# Patient Record
Sex: Female | Born: 1965 | Race: White | Hispanic: No | Marital: Married | Smoking: Never smoker
Health system: Southern US, Community
[De-identification: ages and names within clinical notes are randomized; demographics above are authoritative.]

## PROBLEM LIST (undated history)

## (undated) DIAGNOSIS — E7800 Pure hypercholesterolemia, unspecified: Secondary | ICD-10-CM

## (undated) DIAGNOSIS — N809 Endometriosis, unspecified: Secondary | ICD-10-CM

## (undated) DIAGNOSIS — R0602 Shortness of breath: Secondary | ICD-10-CM

## (undated) DIAGNOSIS — I319 Disease of pericardium, unspecified: Secondary | ICD-10-CM

## (undated) DIAGNOSIS — R079 Chest pain, unspecified: Secondary | ICD-10-CM

## (undated) DIAGNOSIS — Z9889 Other specified postprocedural states: Secondary | ICD-10-CM

## (undated) DIAGNOSIS — T4145XA Adverse effect of unspecified anesthetic, initial encounter: Secondary | ICD-10-CM

## (undated) DIAGNOSIS — Z9289 Personal history of other medical treatment: Secondary | ICD-10-CM

## (undated) DIAGNOSIS — M1990 Unspecified osteoarthritis, unspecified site: Secondary | ICD-10-CM

## (undated) DIAGNOSIS — Z803 Family history of malignant neoplasm of breast: Secondary | ICD-10-CM

## (undated) DIAGNOSIS — Z8051 Family history of malignant neoplasm of kidney: Secondary | ICD-10-CM

## (undated) DIAGNOSIS — T8859XA Other complications of anesthesia, initial encounter: Secondary | ICD-10-CM

## (undated) DIAGNOSIS — Z800 Family history of malignant neoplasm of digestive organs: Secondary | ICD-10-CM

## (undated) DIAGNOSIS — I514 Myocarditis, unspecified: Secondary | ICD-10-CM

## (undated) DIAGNOSIS — R5383 Other fatigue: Secondary | ICD-10-CM

## (undated) DIAGNOSIS — R002 Palpitations: Secondary | ICD-10-CM

## (undated) DIAGNOSIS — K219 Gastro-esophageal reflux disease without esophagitis: Secondary | ICD-10-CM

## (undated) DIAGNOSIS — K922 Gastrointestinal hemorrhage, unspecified: Secondary | ICD-10-CM

## (undated) DIAGNOSIS — K559 Vascular disorder of intestine, unspecified: Secondary | ICD-10-CM

## (undated) DIAGNOSIS — R112 Nausea with vomiting, unspecified: Secondary | ICD-10-CM

## (undated) HISTORY — DX: Myocarditis, unspecified: I51.4

## (undated) HISTORY — DX: Chest pain, unspecified: R07.9

## (undated) HISTORY — DX: Disease of pericardium, unspecified: I31.9

## (undated) HISTORY — DX: Endometriosis, unspecified: N80.9

## (undated) HISTORY — DX: Gastrointestinal hemorrhage, unspecified: K92.2

## (undated) HISTORY — DX: Family history of malignant neoplasm of digestive organs: Z80.0

## (undated) HISTORY — DX: Personal history of other medical treatment: Z92.89

## (undated) HISTORY — DX: Pure hypercholesterolemia, unspecified: E78.00

## (undated) HISTORY — PX: LAPAROSCOPY: SHX197

## (undated) HISTORY — DX: Palpitations: R00.2

## (undated) HISTORY — DX: Family history of malignant neoplasm of kidney: Z80.51

## (undated) HISTORY — DX: Vascular disorder of intestine, unspecified: K55.9

## (undated) HISTORY — DX: Family history of malignant neoplasm of breast: Z80.3

## (undated) HISTORY — DX: Shortness of breath: R06.02

## (undated) HISTORY — DX: Other fatigue: R53.83

---

## 1998-02-06 ENCOUNTER — Inpatient Hospital Stay (HOSPITAL_COMMUNITY): Admission: AD | Admit: 1998-02-06 | Discharge: 1998-02-09 | Payer: Self-pay | Admitting: Obstetrics & Gynecology

## 1998-02-10 ENCOUNTER — Encounter (HOSPITAL_COMMUNITY): Admission: RE | Admit: 1998-02-10 | Discharge: 1998-02-21 | Payer: Self-pay | Admitting: Obstetrics & Gynecology

## 1998-03-15 ENCOUNTER — Other Ambulatory Visit: Admission: RE | Admit: 1998-03-15 | Discharge: 1998-03-15 | Payer: Self-pay | Admitting: Obstetrics & Gynecology

## 2000-03-08 ENCOUNTER — Other Ambulatory Visit: Admission: RE | Admit: 2000-03-08 | Discharge: 2000-03-08 | Payer: Self-pay | Admitting: Obstetrics & Gynecology

## 2000-09-25 HISTORY — PX: TUBAL LIGATION: SHX77

## 2000-09-26 ENCOUNTER — Ambulatory Visit (HOSPITAL_COMMUNITY): Admission: RE | Admit: 2000-09-26 | Discharge: 2000-09-26 | Payer: Self-pay | Admitting: Obstetrics & Gynecology

## 2001-03-31 ENCOUNTER — Other Ambulatory Visit: Admission: RE | Admit: 2001-03-31 | Discharge: 2001-03-31 | Payer: Self-pay | Admitting: Obstetrics & Gynecology

## 2001-08-26 ENCOUNTER — Inpatient Hospital Stay (HOSPITAL_COMMUNITY): Admission: EM | Admit: 2001-08-26 | Discharge: 2001-08-30 | Payer: Self-pay | Admitting: Emergency Medicine

## 2001-08-26 ENCOUNTER — Encounter (INDEPENDENT_AMBULATORY_CARE_PROVIDER_SITE_OTHER): Payer: Self-pay | Admitting: *Deleted

## 2001-08-26 ENCOUNTER — Encounter: Payer: Self-pay | Admitting: Cardiovascular Disease

## 2001-08-26 HISTORY — PX: CARDIAC CATHETERIZATION: SHX172

## 2001-08-27 ENCOUNTER — Encounter: Payer: Self-pay | Admitting: *Deleted

## 2001-08-28 ENCOUNTER — Encounter: Payer: Self-pay | Admitting: *Deleted

## 2002-04-15 ENCOUNTER — Other Ambulatory Visit: Admission: RE | Admit: 2002-04-15 | Discharge: 2002-04-15 | Payer: Self-pay | Admitting: Obstetrics and Gynecology

## 2002-05-27 ENCOUNTER — Encounter (INDEPENDENT_AMBULATORY_CARE_PROVIDER_SITE_OTHER): Payer: Self-pay | Admitting: *Deleted

## 2002-05-27 ENCOUNTER — Ambulatory Visit (HOSPITAL_COMMUNITY): Admission: RE | Admit: 2002-05-27 | Discharge: 2002-05-27 | Payer: Self-pay | Admitting: Obstetrics & Gynecology

## 2002-05-27 HISTORY — PX: DILATION AND CURETTAGE OF UTERUS: SHX78

## 2002-05-28 DIAGNOSIS — I318 Other specified diseases of pericardium: Secondary | ICD-10-CM

## 2002-05-28 HISTORY — DX: Other specified diseases of pericardium: I31.8

## 2003-05-10 ENCOUNTER — Other Ambulatory Visit: Admission: RE | Admit: 2003-05-10 | Discharge: 2003-05-10 | Payer: Self-pay | Admitting: Obstetrics & Gynecology

## 2003-10-15 ENCOUNTER — Encounter (INDEPENDENT_AMBULATORY_CARE_PROVIDER_SITE_OTHER): Payer: Self-pay | Admitting: Specialist

## 2003-10-15 ENCOUNTER — Ambulatory Visit (HOSPITAL_COMMUNITY): Admission: RE | Admit: 2003-10-15 | Discharge: 2003-10-15 | Payer: Self-pay | Admitting: Obstetrics & Gynecology

## 2003-10-15 HISTORY — PX: NOVASURE ABLATION: SHX5394

## 2003-10-15 HISTORY — PX: DILATION AND CURETTAGE OF UTERUS: SHX78

## 2004-05-16 ENCOUNTER — Other Ambulatory Visit: Admission: RE | Admit: 2004-05-16 | Discharge: 2004-05-16 | Payer: Self-pay | Admitting: Obstetrics & Gynecology

## 2004-11-29 ENCOUNTER — Encounter: Admission: RE | Admit: 2004-11-29 | Discharge: 2004-11-29 | Payer: Self-pay | Admitting: Obstetrics & Gynecology

## 2005-05-16 ENCOUNTER — Other Ambulatory Visit: Admission: RE | Admit: 2005-05-16 | Discharge: 2005-05-16 | Payer: Self-pay | Admitting: Obstetrics & Gynecology

## 2006-04-09 ENCOUNTER — Encounter: Admission: RE | Admit: 2006-04-09 | Discharge: 2006-04-09 | Payer: Self-pay | Admitting: Obstetrics & Gynecology

## 2008-05-16 ENCOUNTER — Ambulatory Visit (HOSPITAL_COMMUNITY): Admission: AD | Admit: 2008-05-16 | Discharge: 2008-05-16 | Payer: Self-pay | Admitting: Surgery

## 2008-06-17 ENCOUNTER — Encounter: Admission: RE | Admit: 2008-06-17 | Discharge: 2008-06-17 | Payer: Self-pay | Admitting: Obstetrics & Gynecology

## 2009-03-03 ENCOUNTER — Encounter: Admission: RE | Admit: 2009-03-03 | Discharge: 2009-03-03 | Payer: Self-pay | Admitting: Obstetrics & Gynecology

## 2009-09-22 ENCOUNTER — Encounter: Admission: RE | Admit: 2009-09-22 | Discharge: 2009-09-22 | Payer: Self-pay | Admitting: Obstetrics & Gynecology

## 2010-06-22 ENCOUNTER — Ambulatory Visit: Payer: Self-pay | Admitting: Cardiovascular Disease

## 2010-10-13 NOTE — Op Note (Signed)
[facility]  Patient:    Jennifer Beck, Jennifer Beck                        MRN: [id number] Proc. Date: 09/26/00 Adm. Date:  [id number] Attending:  Minette Headland                           Operative Report  PREOPERATIVE DIAGNOSES:       1. Request for permanent sterilization.                               2. Cyclic primarily left pelvic pain and                                  increasing pelvic pressure.  POSTOPERATIVE DIAGNOSES:      1. Slight uterine enlargement.                               2. Pelvic endometriosis.                               3. Request for permanent sterilization.  OPERATION:                    1. Laparoscopy.                               2. Uterosacral nerve ablation with bipolar                                  coagulation of pelvic endometriotic implants.                               3. Laparoscopic tubal ligation with Filshie                                  clip application to isthmic portion of each                                  tube.  SURGEON:                      Freddy Finner, M.D.  ANESTHESIA:                   General endotracheal anesthesia.  ESTIMATED INTRAOPERATIVE BLOOD LOSS: Less than 10 cc.  INTRAOPERATIVE COMPLICATIONS: None.  INDICATIONS:                  The patient is a 45 year old, with one living child, who has definitively stated that she does not wish to conceive again. She has had progressively increasing, primarily left pelvic pain and increasing generalized pelvic pressure preceding each of her menses.  This has dramatically improved with the onset of menses.  She is now admitted for diagnostic laparoscopy and for laparoscopic tubal sterilization.  INTRAOPERATIVE FINDINGS:  As recorded in the postoperative diagnoses.  The uterus itself was slightly enlarged, slightly irregular, and boggy in appearance.  There were definite powder burn lesions along the pelvic peritoneum just inferior to  the left uterosacral ligament and along the left pelvic sidewall including the lesion over the ureter and on the left ovary. The ovaries and tubes were otherwise normal.  The appendix could not be visualized.  There were no apparent abnormalities in the upper abdomen.  DESCRIPTION OF PROCEDURE:     The patient was admitted on the morning of surgery and brought to the operating room.  There, she was placed under adequate general endotracheal anesthesia and placed in the dorsolithotomy position.  Prep of abdomen, perineum, and vagina was carried out.  Bladder was evacuated using Robinson catheter with sterile technique.  Hulka tenaculum was attached to the cervix.  Sterile drapes were applied.  Two small incisions were made, one at the umbilicus, one just above the symphysis.  An 11 mm disposable trocar was introduced through the umbilical incision while elevating the anterior abdominal Hehn.  Direct inspection revealed adequate placement with no evidence of injury on entry.  Pneumoperitoneum was allowed to accumulate with carbon dioxide gas.  A second trocar was placed through the lower incision, blunt probe and later with Nezhat.  Irrigation probe used. Careful systematic examination of pelvic and abdominal contents was carried out.  Photographs were made and retained in the office record.  Using the bipolar forceps through the operating channel of the laparoscope, all visible evidence of pelvic endometriosis was vaporized.  Great care was exercised over the ureter on the left side to make sure that the coagulation was very superficial.  With the Nezhat irrigation system and lactated Ringers solution, irrigation was carried out to make sure that all visible lesions were identified.  The bipolar forceps were also used to coagulate the uterosacral ligaments at their junction with the cervix.  After completing this, the Filshie clip applying device was backloaded through the  operating channel of the laparoscope.  A clip was placed on each fallopian tube in the isthmic portion.    Hemostasis was complete.  Gas was allowed to escape. All instruments were used.  Marcaine 0.25% plain was injected in the incision sites for postoperative analgesic.  Skin incisions were closed with interrupted subcuticular sutures of 3-0 Dexon.  Steri-Strips were applied to the lower incision.  Sterile dressing was applied to the upper incision.  The patient was awakened and taken to recovery in good condition.  She will be discharged with routine outpatient surgical instructions and Vicodin for pain. She will be followed in the office in approximately 10 days. DD:  09/26/00 TD:  09/26/00 Job: [id number]

## 2010-10-13 NOTE — H&P (Signed)
[place]  Patient:    Jennifer, Beck Visit Number: [id number] MRN: [id number]          Service Type: MED Location: [phone] Attending Physician:  Koren Bound Dictated by:   Alvia Grove., M.D. Admit Date:  08/26/2001 Discharge Date: 08/30/2001   CC:         Viviann Spare A. Cleta Alberts, M.D.   History and Physical  HISTORY OF PRESENT ILLNESS:  Jennifer Beck is a 45 year old female with no significant past medical history.  She presents to the emergency room in transfer from urgent care for further evaluation of some chest pain and shortness of breath.  Akira has had a viral illness for the past several weeks.  She has not had any significant real problems but has had some cough.  She was given some herbal supplements by a friend approximately one week ago and took them for about a week.  She started having some chest pain and tightness approximately three days ago and stopped taking the medication.  She stated that the chest tightness had persisted since that time.  It typically felt better when she sat forward.  She had significant shortness of breath and chest tightness every time she walked around.  She also noticed extreme dyspnea with any sort of exertion.  She presented two days ago to Dr. Viviann Spare A. Daubs office for evaluation.  She was found to have a mildly elevated sed rate, a mildly elevated white blood cell count, mild ST segment elevation with P-R depression and was diagnosed as having pericarditis.  She perhaps got a little bit better but did not make any significant improvement.  She re-presented today for further evaluation.  Because of her persistent symptoms and some worsening of her EKG, she was sent to the [facility] Emergency Room for further evaluation.  Here in the ER, she was found also to have ST segment elevation with P-R depression.  An urgent echocardiogram revealed a mild pericardial effusion with no  evidence of tamponade.  She was quite tachycardic.  Her laboratory data at that point returned positive, revealing the presence of a myocardial infarction.  She was scheduled for urgent heart catheterization at that time.  The patient denies any previous episodes of chest pain.  She has otherwise been fairly healthy.  CURRENT MEDICATIONS:  She takes Prozac on occasion.  She took some herbal medicines last week.  PAST MEDICAL HISTORY:  History of bilateral tubal ligation.  SOCIAL HISTORY:  The patient drinks wine occasionally.  She does not smoke cigarettes.  She does not use any drugs.  FAMILY HISTORY:  Positive for coronary artery disease in her uncles and in her grandfather.  REVIEW OF SYSTEMS:  Review of systems was reviewed and is essentially negative.  PHYSICAL EXAMINATION:  GENERAL:  She is a young female in mild distress.  VITAL SIGNS:  Her heart rate is around 130 to 140.  Her blood pressure is 110/70.  HEENT/NECK:  Carotids 2+.  She has no bruits.  There is no JVD.  There is no thyromegaly.  LUNGS:  Clear to auscultation.  HEART:  Regular rate, S1 and S2 and quite tachycardic.  I did not hear any rub or gallops.  ABDOMEN:  Her abdominal exam revealed good bowel sounds and is nontender.  Her abdomen has no guarding or rebound.  CHEST:  Her chest Lightner is mildly tender.  EXTREMITIES:  She has no clubbing, cyanosis, or edema.  Her calves  are nontender.  Her pulses are intact.  NEUROLOGIC:  Cranial nerves II-XII are intact and her motor and sensory function are intact.  LABORATORY AND ACCESSORY DATA:  Her EKG reveals mild ST segment depression in the inferolateral leads with some mild P-R depression.  Her STAT echocardiogram revealed a very small pericardial effusion.  The official reading is pending at this time.  Her laboratory data reveals positive cardiac enzymes.  ASSESSMENT AND PLAN:  We will proceed directly to the catheterization laboratory for  cardiac catheterization.  With her clinical symptoms and her electrocardiographic abnormalities as well as her enzyme abnormalities, it appears that she has had a myocardial infarction, even at this young age.  We have discussed the risks, benefits and options of heart catheterization.  She understands and agrees to proceed. Dictated by:   Alvia Grove., M.D. Attending Physician:  Koren Bound DD:  08/26/01 TD:  08/27/01 Job: [id number]

## 2010-10-13 NOTE — Discharge Summary (Signed)
[place]. [facility]  Patient:    Jennifer Beck, Jennifer Beck Visit Number: [id number] MRN: [id number]          Service Type: MED Location: [phone] Attending Physician:  Koren Bound Dictated by:   Alvia Grove., M.D. Admit Date:  08/26/2001 Discharge Date: 08/30/2001                             Discharge Summary  DISCHARGE DIAGNOSES: 1. Acute myocarditis/pericarditis with positive cardiac enzymes and a    segmental Rumore motion abnormality. 2. Chest pain. 3. Tachycardia. 4. History of gastrointestinal bleed secondary to ischemic colitis. 5. Hypotension and cardiogenic shock.  DISCHARGE MEDICATIONS: 1. Enteric coated aspirin 81 mg a day. 2. Lopressor 25 mg p.o. b.i.d.  DISPOSITION: The patient is to see Dr. Elease Hashimoto next week.  DIET:  Low fat diet.  HISTORY:  The patient is a 45 year old female who was admitted from the emergency room on August 26, 2001 with severe chest pain, hypotension and tachycardia.  She was found to have ST segment changes consistent with an acute infarction versus pericarditis.  She was found to have cardiac enzymes and was admitted to the hospital.  HOSPITAL COURSE BY PROBLEMS:  #1 - MYOCARDITIS/PERICARDITIS:  The patient had clinical symptoms consistent with pericarditis. Her cardiac enzymes came back to be positive and it was thought that this may be due to myocarditis or a myocardial infarction.  She was taken to the Cath Lab that night.  She was found to have smooth and normal coronary arteries.  She was found to have akinesis of the posterolateral Kinn consistent with either a lateral Schnebly myocardial infarction or perhaps a myocarditis.  Following the heart catheterization she had marked hypotension which responded to IV fluids.  The following day she developed a GI bleed probably due to the hypotension and subsequent ischemic colitis.  The patient was seen by Dr. Luther Parody who performed an upper and  lower endoscopy and found just evidence of ischemic colitis and no other pathology.  She  was also noted to have some sludge in her gallbladder, but this slowly resolved.  The patient gradually improved and was discharged in satisfactory condition.  #2 - GASTROINTESTINAL BLEED:  The patient developed a GI bleed the day following admission. This was thought to be due to hypotension and subsequent ischemic colitis.  The patient healed up quite nicely and did not have any further episodes of GI bleeding.  The patient will be followed up by Dr. Elease Hashimoto. Dictated by:   Alvia Grove., M.D. Attending Physician:  Koren Bound DD:  10/08/01 TD:  10/11/01 Job: [phone] [id number]

## 2010-10-13 NOTE — Op Note (Signed)
NAME:  Jennifer Beck, Jennifer Beck                         ACCOUNT NO.:  [id number]   MEDICAL RECORD NO.:  [id number]                   PATIENT TYPE:  AMB   LOCATION:  [facility]                                  FACILITY:  [facility]   PHYSICIAN:  Freddy Finner, M.D.                DATE OF BIRTH:  1966-01-24   DATE OF PROCEDURE:  DATE OF DISCHARGE:                                 OPERATIVE REPORT   PREOPERATIVE DIAGNOSES:  1. Dysfunctional uterine bleeding.  2. Endometrial polyp by ultrasound.  3. Known history of pelvic endometriosis.  4. Recent complex right ovarian cyst.   POSTOPERATIVE DIAGNOSES:  1. Dysfunctional uterine bleeding.  2. Endometrial polyp.  3. Minimal evidence of pelvic endometriosis, recurrent.  4. Recent complex right ovarian cyst with resolution.   OPERATIVE PROCEDURES:  1. Laparoscopy.  2. Bipolar fulguration of endometriotic implants.  3. Bipolar uterosacral nerve ablation.  4. Hysteroscopy.  5. Dilatation and curettage.  6. Resection of polyp.   INTRAOPERATIVE COMPLICATIONS:  None.   ESTIMATED INTRAOPERATIVE BLOOD LOSS:  20 cc.   INTRAOPERATIVE SORBITOL DEFICIT:  75 cc.   ANESTHESIA:  General endotracheal.   SURGEON:  Freddy Finner, M.D.   INDICATIONS:  The patient was admitted on the morning of surgery.  She has a  history of pelvic pain and previously known pelvic endometriosis.  She  previously had surgical sterilization.  She also was having dysfunctional  uterine bleeding and was found on ultrasound to have an endometrial polyp.  She is admitted now for laparoscopy and hysteroscopy.   FINDINGS:  Operative findings were recorded and still photographs retained  in the office record.  Those findings consisted of overall enlargement of  the uterus which was slightly irregular and boggy, possibly consistent with  adenomyosis and/or small uterine intramural leiomyomata.  There was also an  endometrial polyp.  There were a few little pelvic burn lesions along the  pelvic sidewalls on each side.  No other apparent abnormality was noted  within the abdomen.   DESCRIPTION OF PROCEDURE:  Following admission, the patient was brought to  the operating room and placed under adequate general endotracheal anesthesia  and placed in the dorsolithotomy position.  The abdomen, perineum, and  vagina were prepped in the usual fashion.  The bladder was evacuated with a  Robinson catheter.  The Hulka tenaculum was attached to the cervix.  Sterile  drapes were applied.  Two small incisions were made in the abdomen, one at  the umbilicus and one just above the symphysis.  These were made through old  scars.  The anterior abdominal Novitski was elevated manually and an 11 mm  disposable bladeless trocar was introduced without difficulty at the  umbilicus.  A pneumoperitoneum was allowed to accumulate with carbon dioxide  gas.  A 5 mm trocar was placed at the lower incision.  A blunt probe was  used for manipulation during  the procedure.  Careful systematic examination  of the pelvic and abdominal contents was carried out.  The findings were as  noted above.  Bipolar forceps were passed through the operating channel of  the laparoscopic.  Minimal endometriotic lesions were fulgurated along each  sidewall and the uterosacrals were ablated with the bipolar.  This was done  at the junction with the cervix.  A hysteroscopic procedure was then  performed after all abdominal instruments were removed and incisions were  closed.  The incisions were closed with 3-0 Dexon.  Marcaine 0.25% was  injected in the incision sites for postoperative analgesia.  The Hulka  tenaculum was removed from the cervix.  The anterior cervical lip was  grasped with a single-tooth tenaculum.  The cervix was dilated to 27 with  Hanks dilators.  A 12.5 degree [facility] hysteroscope was introduced.  A polyp  was visualized and photographed.  Gentle thorough curettage was carried out.  Reinspection revealed  adequate removal of the polyp with adequate sampling  of the endometrium.  The uterus sounded to 9 cm.  All instruments were  removed.  The patient was awaken and taken to recovery in good condition.  She will be discharged with routine outpatient instructions with Vicodin for  postoperative pain.  She is to return to the office in approximately one  week for postoperative follow-up.                                               Freddy Finner, M.D.    WRN/MEDQ  D:  05/27/2002  T:  05/27/2002  Job:  [id number]

## 2010-10-13 NOTE — Consult Note (Signed)
[place]. [facility]  Patient:    Jennifer Beck, DETHLEFS Visit Number: [id number] MRN: [id number]          Service Type: MED Location: [phone] Attending Physician:  Koren Bound Dictated by:   Roosvelt Harps, M.D. Proc. Date: 08/27/01 Admit Date:  08/26/2001 Discharge Date: 08/30/2001   CC:         Alvia Grove., M.D.   Consultation Report  Ms. Peeples is a 45 year old female who was admitted yesterday for substernal chest pressure and shortness of breath.  This had been going on through the weekend without relief.  She reports the chest pressure as feeling as if someone was standing on her chest.  It did not radiate to her back or her shoulder.  She has no prior significant GI or cardiovascular disease history. She had been suffering from a sinus infection and from Sunday through Thursday of last week was taking three separate multi herb preparations which were supposed to help her sinuses and boost her immune system.  She underwent a cardiac catheterization that showed normal coronary arteries, but probable posterolateral Rosano motion defect.  She had low level cardiac enzyme positivity.  Today she is feeling considerably better.  She only had vomiting following the catheterization.  She did not vomit any blood.  However, since the catheterization and since receiving heparin, Vioxx, Plavix, and aspirin, she has had small volume diarrheal stool which are bloody.  She is eating a regular diet today without any nausea.  Her hemoglobin has only dropped from 12.2 to 11.9; however, her white blood count rose from 12.3 to 20.1.  She has no fevers or chills.  She reports to me that her mother was sick over the weekend with a gastroenteritis with vomiting and diarrhea.  She denies dysphagia, weight loss, melena, or prior history of gastrointestinal problems. Indigestion present over the weekend and yesterday has resolved.  PAST MEDICAL HISTORY:   Otherwise fairly unremarkable.  PAST SURGICAL HISTORY:  Laparoscopy and tubal ligation last year.  CURRENT MEDICATIONS: 1. Herbals. 2. In hospital she has been treated with one aspirin q.d. 3. Vioxx 25 mg q.d. 4. Plavix 75 mg q.d. 5. Protonix 40 mg q.d. 6. Lopressor 25 mg b.i.d. 7. Altace 2.5 mg q.d.  ALLERGIES:  None reported, although ANESTHESIA at the time of her laparoscopy made her nauseous.  FAMILY HISTORY:  Pertinent for cholelithiasis in her father.  No known family history of ulcers, gallstones, or inflammatory bowel disease.  SOCIAL HISTORY:  Married with one child.  Nonsmoker.  Nondrinker.  REVIEW OF SYSTEMS:  GENERAL:  No weight loss or night sweats.  ENDOCRINE:  No history of thyroid problems or diabetes.  SKIN:  No rash or pruritus.  HEENT: Eyes:  No icterus or change in vision.  ENT:  No aphthous ulcers or chronic sore throat.  RESPIRATORY:  Exertional dyspnea of recent onset.  CARDIAC:  As above.  GASTROINTESTINAL:  As above.  GENITOURINARY:  No dysuria or hematuria.  PHYSICAL EXAMINATION  GENERAL:  She is a well-developed, well-nourished adult female in no acute distress.  VITAL SIGNS:  Afebrile.  Blood pressure 112/64, pulse 96 and regular.  SKIN:  Normal.  HEENT:  Eyes are anicteric.  Oropharynx:  Unremarkable.  NECK:  Supple without thyromegaly.  LYMPH:  There is no cervical or inguinal adenopathy.  CHEST:  Clear.  HEART:  Regular rate and rhythm without murmurs, rubs, or gallops.  ABDOMEN:  Soft with minimal epigastric tenderness  to deep palpation.  I do not appreciate any masses, rebound, or hernia.  RECTAL:  No fissures or external hemorrhoids.  There are no internal masses. There is no stool or blood in the rectum.  EXTREMITIES:  Without clubbing, cyanosis, edema, or rash.  LABORATORIES:  Prothrombin time 13.9.  Electrolytes are normal.  Blood sugar was 151.  Rheumatoid factor was less than 120.  SGOT on admission was 42 and decreased to  38.  All other liver function tests, however, are normal.  Chest x-ray was normal.  IMPRESSION:  A 45 year old female with chest pain and indigestion and shortness of breath on admission which may have been related to coronary spasm with damage to the inferior lateral left ventricle.  It is also conceivable, however, that she had biliary colic which can cause EKG changes.  Her current lower gastrointestinal bleeding is of doubtful significance as her hemoglobin has not significantly dropped and rectal examination is negative.  I do not think that endoscopic studies are urgently needed at this time.  Her gastrointestinal symptoms and, indeed, the whole reason for admission could be due to side effects of the multi herb preparations that she was taking or due to the viral syndrome that her mother was also suffering.  PLAN:  CBC will be checked in the morning.  I am ordering an ultrasound to evaluate her gallbladder as well as stool cultures.  If there is further evidence of gastrointestinal bleeding, colonoscopic evaluation would be reasonable either as an inpatient or outpatient if she is stable.  I will follow along to help make this decision. Dictated by:   Roosvelt Harps, M.D. Attending Physician:  Koren Bound DD:  08/27/01 TD:  08/27/01 Job: [id number]

## 2010-10-13 NOTE — Cardiovascular Report (Signed)
[place]. [facility]  Patient:    Jennifer Beck, Jennifer Beck Visit Number: [id number] MRN: [id number]          Service Type: MED Location: [phone] Attending Physician:  Koren Bound Dictated by:   Alvia Grove., M.D. Admit Date:  08/26/2001 Discharge Date: 08/30/2001   CC:         Waylan Boga, M.D., [facility]   Cardiac Catheterization  HISTORY:  [name] is a 45 year old female with no significant past medical history.  She presents to the ER with several days history of chest pain and pleuritic-like chest pain.  She was found to have ST segment elevation in the inferior and lateral leads but was also found to have some PR depression.  She was originally thought to have pericarditis.  An emergent echocardiogram revealed a very small pericardial effusion and no evidence of tamponade.  She remained fairly tachycardic with a heart rate of 130-140.  Cardiac enzymes revealed a troponin of 3.4 and a CPK-MB of 20.  Because of these lab abnormalities, her clinical symptoms, and the EKG abnormalities, she was taken to the catheterization lab for further evaluation.  DESCRIPTION OF PROCEDURE:  The right femoral artery was easily cannulated using the modified Seldinger technique.  HEMODYNAMICS:  Her heart rate was 130.  During the case, we gave her 5 mg of IV Lopressor which brought her heart rate down to 110.  Her left ventricular pressure is 106/24 with an aortic pressure of 106/82.  ANGIOGRAPHY: 1. Left main coronary artery:  The left main coronary artery is smooth and    normal.  2. Left anterior descending artery:  The LAD is relatively smooth and normal.    It gives off several moderate sized diagonal branches which are smooth and    normal.  3. The left circumflex artery is a fairly normal size.  It is smooth and    normal.  The obtuse marginal arteries are normal.  4. The right coronary artery is very large and is dominant.   It is smooth and    normal.  There is no evidence of thrombosis or spasm.  There is no evidence    of vessel dissection.  LEFT VENTRICULOGRAM:  The left ventriculogram was performed in the 30-degree RAO and a 60-degree LAO projection.  It revealed posterolateral akinesis consistent with a posterolateral myocardial infarction.  The other walls seemed to contract fairly normally.  Overall ejection fraction is fairly normal with an EF of around 60%.  There is no mitral regurgitation.  COMPLICATIONS:  None.  CONCLUSIONS: 1. Smooth and normal coronary arteries. 2. There is a Lawler motion defect consistent with a posterolateral myocardial    infarction.  She quite possibly had a coronary spasm.  She also took some    herbal medications this week.  She may have had some spasm caused by these    herbal medications.  We will continue to watch her closely for any    developing signs of pericarditis.  She may in fact have some pericarditis    secondary to her myocardial infarction. Dictated by:   Alvia Grove., M.D. Attending Physician:  Koren Bound DD:  08/26/01 TD:  08/27/01 Job: [id number]

## 2010-10-13 NOTE — Op Note (Signed)
NAME:  Jennifer Beck, Jennifer Beck                         ACCOUNT NO.:  [id number]   MEDICAL RECORD NO.:  [id number]                   PATIENT TYPE:  AMB   LOCATION:  SDC                                  FACILITY:  [facility]   PHYSICIAN:  Freddy Finner, M.D.                DATE OF BIRTH:  July 28, 1965   DATE OF PROCEDURE:  10/15/2003  DATE OF DISCHARGE:                                 OPERATIVE REPORT   PREOPERATIVE DIAGNOSIS:  Uterine enlargement, menorrhagia.   POSTOPERATIVE DIAGNOSIS:  Uterine enlargement, menorrhagia.   OPERATION:  Hysteroscopy D&C followed by NovaSure ablation of endometrium.   SURGEON:  Freddy Finner, M.D.   ANESTHESIA:  Managed intravenous sedation and paracervical block.   INTRAOPERATIVE COMPLICATIONS:  None.   SORBITOL DEFICIT:  60 mL.   BLOOD LOSS:  Less than or equal to 10 mL.   The patient is a 45 year old with menorrhagia and an enlarged but otherwise  normal uterus and uterine cavity.  She is admitted now for NovaSure for  management of her extreme menorrhagia.   She was admitted on the morning of surgery, brought to the operating room,  there placed under adequate intravenous sedation, placed in the dorsal  lithotomy position using the [facility] stirrup system.  Betadine prep of  perineum and vagina was carried out in the usual fashion.  Sterile drapes  were applied.  A bivalve speculum was introduced and the cervix visualized.  Paracervical block was placed using 10 mL of 1% Xylocaine with 5 mL injected  into each of two sites at 4 and 8 o'clock in the vagina fornices.  The  cervix was grasped on the anterior lip with single tooth tenaculum.  The  cervix sounded to 4 cm.  The uterus sounded to 9.5 cm.  The cervix was  dilated to 23 with [facility] dilators.  ACMI 12.5-degree hysteroscope was  introduced using 3% sorbitol as the distending medium.  The uterine cavity  was normal except for enlargement.  The NovaSure device was then applied in  the standard fashion and  seated and tested for integrity of the uterine  [place].  The cavity width was 4.5 cm.  The ablation took approximately 90  seconds and was accomplished without difficulty.  Reinspection with the  hysteroscope revealed complete ablation of endometrial tissue.  Photographs  pre and post NovaSure were made and retained in the office records.  The  patient tolerated the procedure well and was taken to recovery in good  condition.                                               Freddy Finner, M.D.    WRN/MEDQ  D:  10/15/2003  T:  10/15/2003  Job:  [id number]

## 2011-07-04 ENCOUNTER — Other Ambulatory Visit: Payer: Self-pay | Admitting: Obstetrics & Gynecology

## 2011-07-04 DIAGNOSIS — N63 Unspecified lump in unspecified breast: Secondary | ICD-10-CM

## 2011-07-13 ENCOUNTER — Ambulatory Visit
Admission: RE | Admit: 2011-07-13 | Discharge: 2011-07-13 | Disposition: A | Discharge status: Home / Self Care | Payer: PRIVATE HEALTH INSURANCE | Source: Ambulatory Visit | Attending: Obstetrics & Gynecology | Admitting: Obstetrics & Gynecology

## 2011-07-13 DIAGNOSIS — N63 Unspecified lump in unspecified breast: Secondary | ICD-10-CM

## 2011-11-09 ENCOUNTER — Encounter: Payer: Self-pay | Admitting: *Deleted

## 2012-02-05 ENCOUNTER — Encounter: Payer: Self-pay | Admitting: Physician Assistant

## 2012-02-05 ENCOUNTER — Telehealth: Payer: Self-pay | Admitting: Cardiovascular Disease

## 2012-02-05 ENCOUNTER — Ambulatory Visit (INDEPENDENT_AMBULATORY_CARE_PROVIDER_SITE_OTHER): Payer: PRIVATE HEALTH INSURANCE | Admitting: Physician Assistant

## 2012-02-05 VITALS — BP 129/81 | HR 103 | Ht 65.0 in | Wt 158.1 lb

## 2012-02-05 DIAGNOSIS — R079 Chest pain, unspecified: Secondary | ICD-10-CM

## 2012-02-05 DIAGNOSIS — R000 Tachycardia, unspecified: Secondary | ICD-10-CM

## 2012-02-05 NOTE — Telephone Encounter (Signed)
Left msg on home number to call back, number provided.

## 2012-02-05 NOTE — Telephone Encounter (Signed)
Pt rtn your call

## 2012-02-05 NOTE — Telephone Encounter (Signed)
Left msg to return call

## 2012-02-05 NOTE — Progress Notes (Signed)
[address] [place], [place]  [address] Phone: [phone] Fax:  [phone]  Date:  02/05/2012   Name:  Jennifer Beck   DOB:  Oct 27, 1965   MRN:  [id number]  PCP:  No primary provider on file.  Primary Cardiologist:  Dr. Delane Ginger  Primary Electrophysiologist:  None    History of Present Illness: Jennifer Beck is a 46 y.o. female who presents for evaluation of chest pain.  She has a prior history of pericarditis, palpitations and HL. LHC 4/03 demonstrated normal coronary arteries and normal LV function.  She has had difficulty with substernal pinching in her chest for the last 2 days. This occurs at any time. It is not related to exertion. It lasts only seconds. She denies syncope or near-syncope. She denies dyspnea. She denies exercise intolerance. She does note increased heart rate. She sometimes wakes up with an increased heart rate. She notes increased stress. She denies orthopnea, PND or edema.  Past Medical History  Diagnosis Date  . Fatigue   . Heart palpitations   . Hypercholesterolemia   . Pericarditis   . Myocarditis   . GI bleed     caused by ischemic colitis following an episode of hypertension  . Ischemic colitis   . Chest pain   . SOB (shortness of breath)   . Myocardial infarct 08/26/2001    post cath - EF of 60%    Current Outpatient Prescriptions  Medication Sig Dispense Refill  . aspirin 81 MG tablet Take 81 mg by mouth as needed.       . Cholecalciferol (VITAMIN D-3 PO) Take by mouth every other day.      . Multiple Minerals-Vitamins (CALCIUM-MAGNESIUM-ZINC) TABS Take 1 tablet by mouth as needed.       . Multiple Vitamins-Minerals (MULTIVITAMINS THER. W/MINERALS) TABS Take 1 tablet by mouth as needed.       [name] VITAMIN E PO Take by mouth every other day.        Allergies: Allergies  Allergen Reactions  . Biaxin (Clarithromycin)     Heart Burn    History  Substance Use Topics  . Smoking status: Never Smoker   .  Smokeless tobacco: Never Used  . Alcohol Use: Yes     Occasionally drinks wine    Family History  Problem Relation Age of Onset  . Hypertension Father   . Diabetes Father   . Coronary artery disease Mother   . Coronary artery disease Maternal Grandfather   . Coronary artery disease Maternal Uncle   . Coronary artery disease Maternal Uncle      ROS:  Please see the history of present illness.   She has occasional dyspepsia. She denies dysphagia, odynophagia, pleuritic chest pain, chest pain with lying supine.  No recent travels.  All other systems reviewed and negative.   PHYSICAL EXAM: VS:  BP 129/81  Pulse 103  Ht 5\' 5"  (1.651 m)  Wt 158 lb 1.9 oz (71.723 kg)  BMI 26.31 kg/m2  SpO2 98% Well nourished, well developed, in no acute distress HEENT: normal Neck: no JVD Endocrine: No thyromegaly  Vascular: No carotid bruits  Cardiac:  normal S1, S2; RRR; no murmur Lungs:  clear to auscultation bilaterally, no wheezing, rhonchi or rales Abd: soft, nontender, no hepatomegaly Ext: no edema Skin: warm and dry Neuro:  CNs 2-12 intact, no focal abnormalities noted  EKG:  Sinus rhythm, heart rate 101, normal axis, no acute changes  ASSESSMENT AND PLAN:  1. Chest Pain: Symptoms atypical. Suspect her symptoms are mainly related to stress. Risk factors for coronary disease are minimal. I have recommended an exercise treadmill test to rule out ischemic heart disease. I have also recommended an echocardiogram given her prior history of pericarditis. I have suggested she try Pepcid OTC bid for 1-2 weeks as well.  Followup with Dr. Elease Hashimoto in 1 month.  2. Tachycardia: Heart rate is elevated. She has palpitations at times. I suggest that we proceed with an exercise treadmill test and echocardiogram. Also check a basic metabolic panel, CBC and TSH. If this workup is unrevealing, consider proceeding with an event monitor.  3. Anxiety: She has worsening stress.  This is likely cause of her  symptoms.  We discussed trying to limit her stress as much as possible.   Signed, Tereso Newcomer, PA-C  4:45 PM 02/05/2012

## 2012-02-05 NOTE — Telephone Encounter (Signed)
New Problem:    Patient has been experiencing a pinching sensation in her chest and would like to be seen sooner than her scheduled appointment.  Please call back.

## 2012-02-05 NOTE — Patient Instructions (Addendum)
Your physician recommends that you continue on your current medications as directed. Please refer to the Current Medication list given to you today.  Your physician recommends that you have lab work TODAY; [facility]   Your physician has requested that you have an echocardiogram. Echocardiography is a painless test that uses sound waves to create images of your heart. It provides your doctor with information about the size and shape of your heart and how well your heart's chambers and valves are working. This procedure takes approximately one hour. There are no restrictions for this procedure.   Your physician has requested that you have an exercise tolerance test. For further information please visit [email or web address] Please also follow instruction sheet, as given.  Your physician recommends that you schedule a follow-up appointment in: 1 MONTH WITH DR. Melburn Popper

## 2012-02-05 NOTE — Telephone Encounter (Signed)
App set for today, pt happy with plan.

## 2012-02-06 ENCOUNTER — Telehealth: Payer: Self-pay | Admitting: *Deleted

## 2012-02-06 LAB — CBC WITH DIFFERENTIAL/PLATELET
Basophils Absolute: 0 10*3/uL (ref 0.0–0.1)
Basophils Relative: 0.2 % (ref 0.0–3.0)
Eosinophils Absolute: 0.5 10*3/uL (ref 0.0–0.7)
Hemoglobin: 13.6 g/dL (ref 12.0–15.0)
MCV: 96.4 fl (ref 78.0–100.0)
Monocytes Relative: 5.9 % (ref 3.0–12.0)
Neutrophils Relative %: 68.7 % (ref 43.0–77.0)
Platelets: 371 10*3/uL (ref 150.0–400.0)
RDW: 11.8 % (ref 11.5–14.6)
WBC: 11.5 10*3/uL — ABNORMAL HIGH (ref 4.5–10.5)

## 2012-02-06 LAB — BASIC METABOLIC PANEL: Creatinine, Ser: 0.7 mg/dL (ref 0.4–1.2)

## 2012-02-06 LAB — TSH: TSH: 1.15 u[IU]/mL (ref 0.35–5.50)

## 2012-02-06 NOTE — Telephone Encounter (Signed)
pt notified about lab results today w/verbal understanding. I will fax results to Dr. Konrad Dolores and Julio Sicks @ [facility], pt aware to f/u w/PCP

## 2012-02-06 NOTE — Telephone Encounter (Signed)
Message copied by Tarri Fuller on Wed Feb 06, 2012  3:02 PM ------      Message from: [place], [place] T      Created: Wed Feb 06, 2012  1:12 PM       Labs ok, except WBC little high.      If she is having any symptoms of fever, cough or dysuria, will need to follow up with PCP to rule out infection.      Tereso Newcomer, PA-C  1:12 PM 02/06/2012

## 2012-02-07 ENCOUNTER — Ambulatory Visit: Payer: PRIVATE HEALTH INSURANCE | Admitting: Physician Assistant

## 2012-02-08 ENCOUNTER — Ambulatory Visit: Payer: PRIVATE HEALTH INSURANCE | Admitting: Nurse Practitioner

## 2012-02-12 ENCOUNTER — Telehealth: Payer: Self-pay | Admitting: *Deleted

## 2012-02-12 ENCOUNTER — Ambulatory Visit (HOSPITAL_COMMUNITY): Payer: PRIVATE HEALTH INSURANCE | Attending: Cardiovascular Disease | Admitting: Radiology

## 2012-02-12 DIAGNOSIS — R000 Tachycardia, unspecified: Secondary | ICD-10-CM

## 2012-02-12 DIAGNOSIS — R072 Precordial pain: Secondary | ICD-10-CM | POA: Insufficient documentation

## 2012-02-12 DIAGNOSIS — I059 Rheumatic mitral valve disease, unspecified: Secondary | ICD-10-CM | POA: Insufficient documentation

## 2012-02-12 DIAGNOSIS — R079 Chest pain, unspecified: Secondary | ICD-10-CM

## 2012-02-12 NOTE — Telephone Encounter (Signed)
Pt told that her symptoms are listed as common side effects to citalopram but she needs to discuss dose with PCP that prescribed it but it is not contraindicated for heart pts. Pt will call pcp for advise.

## 2012-02-12 NOTE — Progress Notes (Signed)
Echocardiogram performed.  

## 2012-02-12 NOTE — Telephone Encounter (Signed)
Message copied by Antony Odea on Tue Feb 12, 2012 10:13 AM ------      Message from: Oren Bracket      Created: Tue Feb 12, 2012  8:41 AM       Zolpidem 10 MG and Citalopram 10 MG. New medication given GYN.  Patient took Citalopram and felt nauseated, dizzy and "funny" in chest all day after taking medication.  Please call with drug consult. [phone]

## 2012-02-13 ENCOUNTER — Encounter: Payer: Self-pay | Admitting: Physician Assistant

## 2012-02-13 ENCOUNTER — Telehealth: Payer: Self-pay | Admitting: *Deleted

## 2012-02-13 NOTE — Telephone Encounter (Signed)
lmom echo normal 

## 2012-02-13 NOTE — Telephone Encounter (Signed)
Message copied by Tarri Fuller on Wed Feb 13, 2012 12:55 PM ------      Message from: [place], [place] T      Created: Wed Feb 13, 2012  8:39 AM       Normal LVF      Normal pericardium.      Tereso Newcomer, PA-C  8:39 AM 02/13/2012

## 2012-02-19 ENCOUNTER — Encounter: Payer: PRIVATE HEALTH INSURANCE | Admitting: Physician Assistant

## 2012-03-04 ENCOUNTER — Encounter: Payer: Self-pay | Admitting: Physician Assistant

## 2012-03-04 ENCOUNTER — Encounter: Payer: Self-pay | Admitting: Cardiovascular Disease

## 2012-03-04 ENCOUNTER — Ambulatory Visit (INDEPENDENT_AMBULATORY_CARE_PROVIDER_SITE_OTHER): Payer: PRIVATE HEALTH INSURANCE | Admitting: Physician Assistant

## 2012-03-04 DIAGNOSIS — R000 Tachycardia, unspecified: Secondary | ICD-10-CM

## 2012-03-04 DIAGNOSIS — R079 Chest pain, unspecified: Secondary | ICD-10-CM

## 2012-03-04 NOTE — Procedures (Signed)
Exercise Treadmill Test  Pre-Exercise Testing Evaluation Rhythm: normal sinus  Rate: 82   PR:  .15 QRS:  .07  QT:  .35 QTc: .41     Test  Exercise Tolerance Test Ordering MD: Tereso Newcomer, PA  Interpreting MD: Tereso Newcomer , PA-C   Unique Test No: 1  Treadmill:  1  Indication for ETT: chest pain - rule out ischemia  Contraindication to ETT: No   Stress Modality: exercise - treadmill  Cardiac Imaging Performed: non   Protocol: standard Bruce - maximal  Max BP: 141/78  Max MPHR (bpm):  176 85% MPR (bpm):  150  MPHR obtained (bpm):  164 % MPHR obtained:  93%  Reached 85% MPHR (min:sec):  6:34 Total Exercise Time (min-sec):  9:30  Workload in METS:  10.9 Borg Scale: 17  Reason ETT Terminated:  patient's desire to stop    ST Segment Analysis At Rest: normal ST segments - no evidence of significant ST depression With Exercise: no evidence of significant ST depression  Other Information Arrhythmia:  No Angina during ETT:  absent (0) Quality of ETT:  diagnostic  ETT Interpretation:  normal - no evidence of ischemia by ST analysis  Comments: Good exercise tolerance. No chest pain. Normal BP response to exercise. No ST-T changes to suggest ischemia.   Recommendations: Follow up with Dr. Delane Ginger as directed. Tereso Newcomer, PA-C  9:24 AM 03/04/2012

## 2012-03-21 ENCOUNTER — Encounter: Payer: Self-pay | Admitting: Cardiovascular Disease

## 2012-03-21 ENCOUNTER — Ambulatory Visit (INDEPENDENT_AMBULATORY_CARE_PROVIDER_SITE_OTHER): Payer: PRIVATE HEALTH INSURANCE | Admitting: Cardiovascular Disease

## 2012-03-21 VITALS — BP 130/70 | HR 98 | Resp 18 | Ht 65.0 in | Wt 159.4 lb

## 2012-03-21 DIAGNOSIS — R0789 Other chest pain: Secondary | ICD-10-CM | POA: Insufficient documentation

## 2012-03-21 NOTE — Progress Notes (Signed)
Jennifer Beck Date of Birth  03-11-1966       [facility]    [place] [address]  [address] [place], [place]  [address]   [place], [place]  [address] [phone]     [phone]   Fax  [phone]    Fax [phone]  Problem List: 1. Pericarditis 2. Normal cardiac cath  3. Anxiety  History of Present Illness:  Jennifer Beck is doing well.  She has a hx of pericarditis in the past.  She saw Lorin Picket last month for chest pain.  Echo was unremarkable and stress myoview was normal.  She is feeling better at this point.   Current Outpatient Prescriptions on File Prior to Visit  Medication Sig Dispense Refill  . aspirin 81 MG tablet Take 81 mg by mouth as needed.       . Cholecalciferol (VITAMIN D-3 PO) Take by mouth every other day.      . citalopram (CELEXA) 10 MG tablet       . Multiple Minerals-Vitamins (CALCIUM-MAGNESIUM-ZINC) TABS Take 1 tablet by mouth as needed.       . Multiple Vitamins-Minerals (MULTIVITAMINS THER. W/MINERALS) TABS Take 1 tablet by mouth as needed.       [name] VITAMIN E PO Take by mouth every other day.      . zolpidem (AMBIEN) 10 MG tablet         Allergies  Allergen Reactions  . Biaxin (Clarithromycin)     Heart Burn    Past Medical History  Diagnosis Date  . Fatigue   . Heart palpitations   . Hypercholesterolemia   . Pericarditis   . Myocarditis   . GI bleed     caused by ischemic colitis following an episode of hypertension  . Ischemic colitis   . Chest pain   . SOB (shortness of breath)   . Myocardial infarct 08/26/2001    post cath - EF of 60%  . Hx of echocardiogram     a. echo 9/13:  EF 55-60%, Gr 2 diast dysfn    Past Surgical History  Procedure Date  . Tubal ligation 09/2000    bilateral  . Cardiac catheterization 08/26/2001    EF of 60% --- smooth & normal coronary arteries -- there is a Donaghey motion defect consistent with posterolateral MI -- she quite possibly had a coronary  spasm -- she may have some pericarditis secondary to her MI   . Dilation and curettage of uterus 10/15/2003    Uterine enlargement, menorrhagia  . Novasure ablation 10/15/2003    for management of her extreme menorrhagi  . Dilation and curettage of uterus 05/27/2002    Dysfunctional uterine bleeding    History  Smoking status  . Never Smoker   Smokeless tobacco  . Never Used    History  Alcohol Use  . Yes    Occasionally drinks wine    Family History  Problem Relation Age of Onset  . Hypertension Father   . Diabetes Father   . Coronary artery disease Mother   . Coronary artery disease Maternal Grandfather   . Coronary artery disease Maternal Uncle   . Coronary artery disease Maternal Uncle     Reviw of Systems:  Reviewed in the HPI.  All other systems are negative.  Physical Exam: Blood pressure 130/70, pulse 98, resp. rate 18, height 5\' 5"  (1.651 m), weight 159 lb 6.4 oz (72.303 kg), SpO2 98.00%. General: Well developed,  well nourished, in no acute distress.  Head: Normocephalic, atraumatic, sclera non-icteric, mucus membranes are moist,   Neck: Supple. Carotids are 2 + without bruits. No JVD  Lungs: Clear bilaterally to auscultation.  Heart: regular rate.  normal  S1 S2. No murmurs, gallops or rubs.  Abdomen: Soft, non-tender, non-distended with normal bowel sounds. No hepatomegaly. No rebound/guarding. No masses.  Msk:  Strength and tone are normal  Extremities: No clubbing or cyanosis. No edema.  Distal pedal pulses are 2+ and equal bilaterally.  Neuro: Alert and oriented X 3. Moves all extremities spontaneously.  Psych:  Responds to questions appropriately with a normal affect.  ECG:  Assessment / Plan:

## 2012-03-21 NOTE — Patient Instructions (Addendum)
Your physician recommends that you schedule a follow-up appointment in: AS NEEDED BASIS  Your physician recommends that you continue on your current medications as directed. Please refer to the Current Medication list given to you today.    

## 2012-03-21 NOTE — Assessment & Plan Note (Signed)
She is not having any further episodes of CP.    She had an episode of pericarditis many years ago.  She had an abnormal ECG and +troponins and ultimately had a cath that showed normal coronaries.  I will see her again on an as needed basis.

## 2012-05-22 ENCOUNTER — Other Ambulatory Visit: Payer: Self-pay

## 2013-02-19 ENCOUNTER — Other Ambulatory Visit: Payer: Self-pay | Admitting: Gastroenterology

## 2013-02-19 DIAGNOSIS — R1013 Epigastric pain: Secondary | ICD-10-CM

## 2013-02-24 ENCOUNTER — Ambulatory Visit
Admission: RE | Admit: 2013-02-24 | Discharge: 2013-02-24 | Disposition: A | Discharge status: Home / Self Care | Payer: PRIVATE HEALTH INSURANCE | Source: Ambulatory Visit | Attending: Gastroenterology | Admitting: Gastroenterology

## 2013-02-24 DIAGNOSIS — R1013 Epigastric pain: Secondary | ICD-10-CM

## 2013-04-17 ENCOUNTER — Other Ambulatory Visit: Payer: Self-pay | Admitting: Obstetrics & Gynecology

## 2013-04-17 DIAGNOSIS — N6320 Unspecified lump in the left breast, unspecified quadrant: Secondary | ICD-10-CM

## 2013-05-04 ENCOUNTER — Ambulatory Visit
Admission: RE | Admit: 2013-05-04 | Discharge: 2013-05-04 | Disposition: A | Discharge status: Home / Self Care | Payer: PRIVATE HEALTH INSURANCE | Source: Ambulatory Visit | Attending: Obstetrics & Gynecology | Admitting: Obstetrics & Gynecology

## 2013-05-04 DIAGNOSIS — N6320 Unspecified lump in the left breast, unspecified quadrant: Secondary | ICD-10-CM

## 2013-10-21 ENCOUNTER — Other Ambulatory Visit: Payer: Self-pay | Admitting: Dermatology

## 2014-07-19 ENCOUNTER — Other Ambulatory Visit: Payer: Self-pay | Admitting: Obstetrics and Gynecology

## 2014-07-20 LAB — CYTOLOGY - PAP

## 2015-11-14 DIAGNOSIS — J3501 Chronic tonsillitis: Secondary | ICD-10-CM | POA: Insufficient documentation

## 2016-07-04 DIAGNOSIS — H5310 Unspecified subjective visual disturbances: Secondary | ICD-10-CM | POA: Diagnosis not present

## 2016-07-04 DIAGNOSIS — H53143 Visual discomfort, bilateral: Secondary | ICD-10-CM | POA: Diagnosis not present

## 2016-07-12 DIAGNOSIS — H53143 Visual discomfort, bilateral: Secondary | ICD-10-CM | POA: Diagnosis not present

## 2016-07-18 DIAGNOSIS — H5310 Unspecified subjective visual disturbances: Secondary | ICD-10-CM | POA: Diagnosis not present

## 2016-07-18 DIAGNOSIS — H53143 Visual discomfort, bilateral: Secondary | ICD-10-CM | POA: Diagnosis not present

## 2016-08-01 DIAGNOSIS — H5340 Unspecified visual field defects: Secondary | ICD-10-CM | POA: Diagnosis not present

## 2016-08-01 DIAGNOSIS — H04123 Dry eye syndrome of bilateral lacrimal glands: Secondary | ICD-10-CM | POA: Diagnosis not present

## 2016-08-15 DIAGNOSIS — Z1231 Encounter for screening mammogram for malignant neoplasm of breast: Secondary | ICD-10-CM | POA: Diagnosis not present

## 2016-08-15 DIAGNOSIS — Z01419 Encounter for gynecological examination (general) (routine) without abnormal findings: Secondary | ICD-10-CM | POA: Diagnosis not present

## 2016-12-11 DIAGNOSIS — E7800 Pure hypercholesterolemia, unspecified: Secondary | ICD-10-CM | POA: Diagnosis not present

## 2016-12-11 DIAGNOSIS — Z8679 Personal history of other diseases of the circulatory system: Secondary | ICD-10-CM | POA: Diagnosis not present

## 2016-12-11 DIAGNOSIS — R197 Diarrhea, unspecified: Secondary | ICD-10-CM | POA: Diagnosis not present

## 2016-12-11 DIAGNOSIS — E559 Vitamin D deficiency, unspecified: Secondary | ICD-10-CM | POA: Diagnosis not present

## 2017-03-13 ENCOUNTER — Other Ambulatory Visit: Payer: Self-pay | Admitting: Internal Medicine

## 2017-03-13 DIAGNOSIS — N6310 Unspecified lump in the right breast, unspecified quadrant: Secondary | ICD-10-CM

## 2017-03-18 ENCOUNTER — Ambulatory Visit
Admission: RE | Admit: 2017-03-18 | Discharge: 2017-03-18 | Disposition: A | Discharge status: Home / Self Care | Payer: 59 | Source: Ambulatory Visit | Attending: Internal Medicine | Admitting: Internal Medicine

## 2017-03-18 ENCOUNTER — Other Ambulatory Visit: Payer: Self-pay | Admitting: Internal Medicine

## 2017-03-18 DIAGNOSIS — N6310 Unspecified lump in the right breast, unspecified quadrant: Secondary | ICD-10-CM

## 2017-03-18 DIAGNOSIS — N6312 Unspecified lump in the right breast, upper inner quadrant: Secondary | ICD-10-CM | POA: Diagnosis not present

## 2017-03-18 DIAGNOSIS — R922 Inconclusive mammogram: Secondary | ICD-10-CM | POA: Diagnosis not present

## 2017-03-19 DIAGNOSIS — Z23 Encounter for immunization: Secondary | ICD-10-CM | POA: Diagnosis not present

## 2017-03-21 ENCOUNTER — Ambulatory Visit
Admission: RE | Admit: 2017-03-21 | Discharge: 2017-03-21 | Disposition: A | Discharge status: Home / Self Care | Payer: 59 | Source: Ambulatory Visit | Attending: Internal Medicine | Admitting: Internal Medicine

## 2017-03-21 ENCOUNTER — Other Ambulatory Visit: Payer: Self-pay | Admitting: Internal Medicine

## 2017-03-21 DIAGNOSIS — N6310 Unspecified lump in the right breast, unspecified quadrant: Secondary | ICD-10-CM

## 2017-03-21 DIAGNOSIS — N6312 Unspecified lump in the right breast, upper inner quadrant: Secondary | ICD-10-CM | POA: Diagnosis not present

## 2017-03-21 DIAGNOSIS — C50211 Malignant neoplasm of upper-inner quadrant of right female breast: Secondary | ICD-10-CM | POA: Diagnosis not present

## 2017-03-22 ENCOUNTER — Telehealth: Payer: Self-pay | Admitting: Oncology

## 2017-03-22 NOTE — Telephone Encounter (Signed)
Spoke with patient to confirm morning [facility] appointment for 03/27/2017, paperwork will be emailed to patient

## 2017-03-25 ENCOUNTER — Encounter: Payer: Self-pay | Admitting: Oncology

## 2017-03-25 ENCOUNTER — Encounter: Payer: Self-pay | Admitting: *Deleted

## 2017-03-25 NOTE — Progress Notes (Signed)
Jennifer Beck  [phone]) [phone] [phone]) [phone]     ID: Jennifer Beck DOB: [birth year]  MR#: [id number]  WUX#:[id number]  Patient Care Team: [name], MD as PCP - General (Internal Medicine) Valma Cava, MD as Referring Physician (Specialist) [name], MD as Consulting Physician (Dermatology) [name], DC as Referring Physician (Chiropractic Medicine) [name], MD as Consulting Physician (General Surgery) [name], MD as Attending Physician (Radiation Oncology) [name], MD as Consulting Physician (Obstetrics and Gynecology) OTHER MD:  CHIEF COMPLAINT: Triple negative breast cancer  CURRENT TREATMENT: Neoadjuvant chemotherapy   HISTORY OF CURRENT ILLNESS:  Analeese palpated a mass in her right breast sometime in September 2018.Jennifer Beck She brought this to medical attention and her PCP, [name], set her up on 03/18/2017 for a unilateral right diagnostic mammography with ultrasonography, showing: Breast density D. The palpable right breast mass at 12:30 was indeterminate, and biopsy was warranted at that time. No evidence of right axillary lymphadenopathy was noted. Biopsy of the lesion in question on 03/21/2017 at the right breast 12:30 position showed ([id number])  invasive ductal carcinoma with lymphovascular invasion.  The tumor was grade 3, triple negative, with an MIB-1 of 80%.  She is accompanied by her husband, [name] to the office today. She reports that she is doing well overall. She initially felt a lump in September in the shower and notes that she doesn't complete monthly self breast exams. She had a routine mammogram in March 2018 at [facility] and she is followed by Dr. [name].  As far as surgeries, she has had two laparoscopic procedures for endometriosis as well as a bilateral tubal ligation. She still has occasional cramping, painful ovulation, and vaginal spotting that comes and  goes. She has had cyst to her breast before that have been evaluated and ruled as negative. She notes that she still has her tonsils, gallbladder, and appendix. She has a prior hx of pericarditis in 2003 that she notes was brought onby a virus. She has since been cleared by her prior Cardiologist.   She has a Restaurant manager, fast food, [name] that she sees every 5 weeks for maintenance of her neck and back issues. She has a dermatologist, [name] at [facility] and she has had an biopsy of an area from her left chest that resulted as basal cell carcinoma in 2015. She denies having a GI specialist, Cardiologist, or Orthopedist at this time. She denies prior history of seizures, migraines, acid reflux, asthma, emphysema, palpitations or GI issues.    [name] is as detailed below.  INTERVAL HISTORY: Colletta Maryland was evaluated in the interdisciplinary breast clinic March 27, 2017 accompanied by her husband [name]. Her case was also presented at the multidisciplinary breast cancer conference on the same day. At that time a preliminary plan was proposed: Neoadjuvant chemotherapy, followed by breast conserving surgery, adjuvant radiation, and consideration of the UPBEAT trial   REVIEW OF SYSTEMS:  Madalaine reports mild aches and pains. She also reports mild premenstrual symptoms consistent of abdominal cramping and mild vaginal spotting.  Aside from the mass itself, there were no specific symptoms leading to the original mammogram, which was routinely scheduled. The patient denies unusual headaches, visual changes, nausea, vomiting, stiff neck, dizziness, or gait imbalance. There has been no cough, phlegm production, or pleurisy, no chest pain or pressure, and no change in bowel or bladder habits. The patient denies fever, rash, bleeding, unexplained fatigue or unexplained weight loss.  A detailed review of systems was otherwise entirely negative.   PAST MEDICAL  HISTORY: Past Medical History:  Diagnosis Date  . Chest pain   . Endometriosis   . Fatigue   . GI bleed    caused by ischemic colitis following an episode of hypertension  . Heart palpitations   . Hx of echocardiogram    a. echo 9/13:  EF 55-60%, Gr 2 diast dysfn  . Hypercholesterolemia   . Ischemic colitis ([place])   . Myocardial infarct ([place]) 08/26/2001   post cath - EF of 60%  . Myocarditis ([place])   . Pericarditis   . SOB (shortness of breath)     PAST SURGICAL HISTORY: Past Surgical History:  Procedure Laterality Date  . CARDIAC CATHETERIZATION  08/26/2001   EF of 60% --- smooth & normal coronary arteries -- there is a Grace motion defect consistent with posterolateral MI -- she quite possibly had a coronary spasm -- she may have some pericarditis secondary to her MI   . DILATION AND CURETTAGE OF UTERUS  10/15/2003   Uterine enlargement, menorrhagia  . DILATION AND CURETTAGE OF UTERUS  05/27/2002   Dysfunctional uterine bleeding  . NOVASURE ABLATION  10/15/2003   for management of her extreme menorrhagi  . TUBAL LIGATION  09/2000   bilateral    FAMILY HISTORY Family History  Problem Relation Age of Onset  . Hypertension Father   . Diabetes Father   . Coronary artery disease Mother   . Kidney cancer Mother   . Coronary artery disease Maternal Grandfather   . Colon cancer Maternal Grandfather   . Coronary artery disease Maternal Uncle   . Coronary artery disease Maternal Uncle   . Colon cancer Maternal Grandmother   . Breast cancer Cousin   . Cervical cancer Maternal Aunt    Her father died last 2016-02-12 at age 58 just 24 week shy of his 83rd birthday. Her mother is 49 years old as of October 2018.Jennifer Beck  Patient has one brother and no sisters. Her mother was diagnosed with kidney cancer at age 49 with a nephrectomy following. Her maternal grandmother was diagnosed with colon cancer at age 49.  The patient paternal grandfather was diagnosed with colon cancer in his  88's. She has paternal cousins through her fathers half-sisters that have been diagnosed with breast cancer, she is unsure of the number of breast cancer occurrences due to the distance . A maternal cousin was diagnosed with breast cancer at age 18 and she had genetic testing. The patient does have a copy of this testing as well as her PCP.  This was not available for review on the nasal  GYNECOLOGIC HISTORY:  No LMP recorded. Patient has had an ablation.   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 Her husband [name] is a Airline pilot. Her daughter [name] is 55 and currently a sophomore at [employer]. 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  The patient attends [employer]. She lives in [place].      ADVANCED DIRECTIVES:    HEALTH MAINTENANCE: Social History  Substance Use Topics  . Smoking status: Never Smoker  . Smokeless tobacco: Never Used  .  Alcohol use Yes     Comment: Occasionally drinks wine     Colonoscopy: Yes, 2003  PAP: March 2018   Bone density: N/A   Allergies  Allergen Reactions  . Codeine Itching  . Biaxin [Clarithromycin]     Heart Burn    Current Outpatient Prescriptions  Medication Sig Dispense Refill  . acetaminophen (TYLENOL) 325 MG tablet Take 650 mg by mouth every 6 (six) hours as needed.    . Cholecalciferol (VITAMIN D-3 PO) Take by mouth every other day.    . Evening Primrose Oil 1000 MG CAPS Take by mouth.    . fluocinonide cream (LIDEX) 1.54 % Apply 1 application topically 2 (two) times daily.    Jennifer Beck glucosamine-chondroitin 500-400 MG tablet Take 1 tablet by mouth 3 (three) times daily.    Jennifer Beck ibuprofen (ADVIL,MOTRIN) 200 MG tablet Take 200 mg by mouth  every 6 (six) hours as needed.    . Melatonin 5 MG TABS Take by mouth.    . Multiple Minerals-Vitamins (CALCIUM-MAGNESIUM-ZINC) TABS Take 1 tablet by mouth as needed.     . Multiple Vitamins-Minerals (MULTIVITAMINS THER. W/MINERALS) TABS Take 1 tablet by mouth as needed.     . Omega-3 Fatty Acids (FISH OIL) 1000 MG CAPS Take by mouth.    Jennifer Beck VITAMIN E PO Take by mouth every other day.    Jennifer Beck dexamethasone (DECADRON) 4 MG tablet Take 2 tablets by mouth once a day on the day after chemotherapy and then take 2 tablets two times a day for 2 days. Take with food. 30 tablet 1  . lidocaine-prilocaine (EMLA) cream Apply to affected area once 30 g 3  . LORazepam (ATIVAN) 0.5 MG tablet Take 1 tablet (0.5 mg total) by mouth at bedtime as needed (Nausea or vomiting). 30 tablet 0  . prochlorperazine (COMPAZINE) 10 MG tablet Take 1 tablet (10 mg total) by mouth every 6 (six) hours as needed (Nausea or vomiting). 30 tablet 1   No current facility-administered medications for this visit.     OBJECTIVE: Middle-aged white woman who appears younger than stated age  67:   03/27/17 0835  BP: 137/79  Pulse: 80  Resp: 18  Temp: 98.2 F (36.8 C)  SpO2: 100%     Body mass index is 27.21 kg/m.   Wt Readings from Last 3 Encounters:  03/27/17 163 lb 8 oz (74.2 kg)  03/21/12 159 lb 6.4 oz (72.3 kg)  02/05/12 158 lb 1.9 oz (71.7 kg)      ECOG FS:0 - Asymptomatic  Ocular: Sclerae unicteric, pupils round and equal Ear-nose-throat: Oropharynx clear and moist Lymphatic: No cervical or supraclavicular adenopathy Lungs no rales or rhonchi Heart regular rate and rhythm Abd soft, nontender, positive bowel sounds MSK no focal spinal tenderness, no joint edema Neuro: non-focal, well-oriented, appropriate affect Breasts: The area in the upper inner quadrant of the right breast has recently been biopsied.  There is a minimal ecchymosis.  The left breast is unremarkable.  Both axillae are benign.   LAB  RESULTS:  CMP     Component Value Date/Time   NA 138 03/27/2017 0813   K 4.1 03/27/2017 0813   CL 106 02/05/2012 1742   CO2 26 03/27/2017 0813   GLUCOSE 90 03/27/2017 0813   BUN 11.7 03/27/2017 0813   CREATININE 0.7 03/27/2017 0813   CALCIUM 9.2 03/27/2017 0813   PROT 7.1 03/27/2017 0813   ALBUMIN 3.9 03/27/2017 0813   AST 15 03/27/2017 0813   ALT 14 03/27/2017  0813   ALKPHOS 63 03/27/2017 0813   BILITOT 0.34 03/27/2017 0813    No results found for: TOTALPROTELP, ALBUMINELP, A1GS, A2GS, BETS, BETA2SER, GAMS, MSPIKE, SPEI  No results found for: Nils Pyle, [facility]  Lab Results  Component Value Date   WBC 7.3 03/27/2017   NEUTROABS 4.1 03/27/2017   HGB 13.5 03/27/2017   HCT 38.6 03/27/2017   MCV 94.5 03/27/2017   PLT 393 03/27/2017    '@LASTCHEMISTRY' @  No results found for: LABCA2  No components found for: [id number]  No results for input(s): INR in the last 168 hours.  No results found for: LABCA2  No results found for: ATF573  No results found for: UKG254  No results found for: YHC623  No results found for: CA2729  No components found for: HGQUANT  No results found for: CEA1 / No results found for: CEA1   No results found for: AFPTUMOR  No results found for: Simpson  No results found for: PSA1  Appointment on 03/27/2017  Component Date Value Ref Range Status  . WBC 03/27/2017 7.3  3.9 - 10.3 10e3/uL Final  . NEUT# 03/27/2017 4.1  1.5 - 6.5 10e3/uL Final  . HGB 03/27/2017 13.5  11.6 - 15.9 g/dL Final  . HCT 03/27/2017 38.6  34.8 - 46.6 % Final  . Platelets 03/27/2017 393  145 - 400 10e3/uL Final  . MCV 03/27/2017 94.5  79.5 - 101.0 fL Final  . MCH 03/27/2017 33.0  25.1 - 34.0 pg Final  . MCHC 03/27/2017 34.9  31.5 - 36.0 g/dL Final  . RBC 03/27/2017 4.08  3.70 - 5.45 10e6/uL Final  . RDW 03/27/2017 12.0  11.2 - 14.5 % Final  . lymph# 03/27/2017 2.0  0.9 - 3.3 10e3/uL Final  . MONO# 03/27/2017 0.7  0.1 - 0.9 10e3/uL Final   . Eosinophils Absolute 03/27/2017 0.5  0.0 - 0.5 10e3/uL Final  . Basophils Absolute 03/27/2017 0.0  0.0 - 0.1 10e3/uL Final  . NEUT% 03/27/2017 56.3  38.4 - 76.8 % Final  . LYMPH% 03/27/2017 27.0  14.0 - 49.7 % Final  . MONO% 03/27/2017 9.2  0.0 - 14.0 % Final  . EOS% 03/27/2017 7.2* 0.0 - 7.0 % Final  . BASO% 03/27/2017 0.3  0.0 - 2.0 % Final  . Sodium 03/27/2017 138  136 - 145 mEq/L Final  . Potassium 03/27/2017 4.1  3.5 - 5.1 mEq/L Final  . Chloride 03/27/2017 105  98 - 109 mEq/L Final  . CO2 03/27/2017 26  22 - 29 mEq/L Final  . Glucose 03/27/2017 90  70 - 140 mg/dl Final   Glucose reference range is for nonfasting patients. Fasting glucose reference range is 70- 100.  Jennifer Beck BUN 03/27/2017 11.7  7.0 - 26.0 mg/dL Final  . Creatinine 03/27/2017 0.7  0.6 - 1.1 mg/dL Final  . Total Bilirubin 03/27/2017 0.34  0.20 - 1.20 mg/dL Final  . Alkaline Phosphatase 03/27/2017 63  40 - 150 U/L Final  . AST 03/27/2017 15  5 - 34 U/L Final  . ALT 03/27/2017 14  0 - 55 U/L Final  . Total Protein 03/27/2017 7.1  6.4 - 8.3 g/dL Final  . Albumin 03/27/2017 3.9  3.5 - 5.0 g/dL Final  . Calcium 03/27/2017 9.2  8.4 - 10.4 mg/dL Final  . Anion Gap 03/27/2017 7  3 - 11 mEq/L Final  . EGFR 03/27/2017 >60  >60 ml/min/1.73 m2 Final   eGFR is calculated using the CKD-EPI Creatinine Equation (2009)    (this  displays the last labs from the last 3 days)  No results found for: TOTALPROTELP, ALBUMINELP, A1GS, A2GS, BETS, BETA2SER, GAMS, MSPIKE, SPEI (this displays SPEP labs)  No results found for: KPAFRELGTCHN, LAMBDASER, KAPLAMBRATIO (kappa/lambda light chains)  No results found for: HGBA, HGBA2QUANT, HGBFQUANT, HGBSQUAN (Hemoglobinopathy evaluation)   No results found for: LDH  No results found for: IRON, TIBC, IRONPCTSAT (Iron and TIBC)  No results found for: FERRITIN  Urinalysis No results found for: COLORURINE, APPEARANCEUR, LABSPEC, PHURINE, GLUCOSEU, HGBUR, BILIRUBINUR, KETONESUR, PROTEINUR,  UROBILINOGEN, NITRITE, LEUKOCYTESUR   STUDIES: US Breast Ltd Uni Right Inc Axilla  Result Date: 03/18/2017 CLINICAL DATA:  51 year old female presenting for evaluation of a palpable mass lump in the right breast. EXAM: 2D DIGITAL DIAGNOSTIC UNILATERAL RIGHT MAMMOGRAM WITH CAD AND ADJUNCT TOMO RIGHT BREAST ULTRASOUND COMPARISON:  Previous exam(s). ACR Breast Density Category d: The breast tissue is extremely dense, which lowers the sensitivity of mammography. FINDINGS: A BB has been placed on the upper-inner quadrant of the right breast indicating the palpable site of concern. Deep to the palpable marker on the spot compression tomosynthesis images there is an obscured mass. Mammographic images were processed with CAD. Ultrasound targeted to the right breast at 12:30, 4 cm from the nipple demonstrates an irregular hypoechoic mass with indistinct margins measuring 2.4 x 1.3 x 1.9 cm. Blood flow was identified within the mass on color Doppler imaging. Ultrasound of the right axilla demonstrates multiple normal-appearing lymph nodes. IMPRESSION: 1. The palpable right breast mass at 12:30 is indeterminate, and biopsy is warranted. 2.  No evidence of right axillary lymphadenopathy. RECOMMENDATION: Ultrasound-guided biopsy is recommended for the right breast mass. This has been scheduled for 05/21/2017 at 1:45 p.m. I have discussed the findings and recommendations with the patient. Results were also provided in writing at the conclusion of the visit. If applicable, a reminder letter will be sent to the patient regarding the next appointment. BI-RADS CATEGORY  4: Suspicious. Electronically Signed   By: Ammie Ferrier M.D.   On: 03/18/2017 14:07   Mm Diag Breast Tomo Uni Right  Result Date: 03/18/2017 CLINICAL DATA:  51 year old female presenting for evaluation of a palpable mass lump in the right breast. EXAM: 2D DIGITAL DIAGNOSTIC UNILATERAL RIGHT MAMMOGRAM WITH CAD AND ADJUNCT TOMO RIGHT BREAST ULTRASOUND  COMPARISON:  Previous exam(s). ACR Breast Density Category d: The breast tissue is extremely dense, which lowers the sensitivity of mammography. FINDINGS: A BB has been placed on the upper-inner quadrant of the right breast indicating the palpable site of concern. Deep to the palpable marker on the spot compression tomosynthesis images there is an obscured mass. Mammographic images were processed with CAD. Ultrasound targeted to the right breast at 12:30, 4 cm from the nipple demonstrates an irregular hypoechoic mass with indistinct margins measuring 2.4 x 1.3 x 1.9 cm. Blood flow was identified within the mass on color Doppler imaging. Ultrasound of the right axilla demonstrates multiple normal-appearing lymph nodes. IMPRESSION: 1. The palpable right breast mass at 12:30 is indeterminate, and biopsy is warranted. 2.  No evidence of right axillary lymphadenopathy. RECOMMENDATION: Ultrasound-guided biopsy is recommended for the right breast mass. This has been scheduled for 05/21/2017 at 1:45 p.m. I have discussed the findings and recommendations with the patient. Results were also provided in writing at the conclusion of the visit. If applicable, a reminder letter will be sent to the patient regarding the next appointment. BI-RADS CATEGORY  4: Suspicious. Electronically Signed   By: Ammie Ferrier M.D.  On: 03/18/2017 14:07   Mm Clip Placement Right  Result Date: 03/21/2017 CLINICAL DATA:  Post ultrasound-guided biopsy of a suspicious mass in the right breast at the 12:30 position. EXAM: DIAGNOSTIC RIGHT MAMMOGRAM POST ULTRASOUND BIOPSY COMPARISON:  Previous exam(s). FINDINGS: Mammographic images were obtained following ultrasound guided biopsy of a suspicious mass in the right breast at the 12:30 position. Ribbon shaped biopsy marking clip is present at the site of the biopsied mass in the right breast. IMPRESSION: Appropriate position of ribbon shaped biopsy marking clip post ultrasound-guided biopsy of a  mass in the right breast at the 12:30 position. Final Assessment: Post Procedure Mammograms for Marker Placement Electronically Signed   By: Everlean Alstrom M.D.   On: 03/21/2017 14:38   [place] Rt Breast Bx W Loc Dev 1st Lesion Img Bx Spec US Guide  Result Date: 03/21/2017 CLINICAL DATA:  51 year old female with a suspicious mass in the right breast at the 12:30 position. EXAM: ULTRASOUND GUIDED RIGHT BREAST CORE NEEDLE BIOPSY COMPARISON:  Previous exam(s). FINDINGS: I met with the patient and we discussed the procedure of ultrasound-guided biopsy, including benefits and alternatives. We discussed the high likelihood of a successful procedure. We discussed the risks of the procedure, including infection, bleeding, tissue injury, clip migration, and inadequate sampling. Informed written consent was given. The usual time-out protocol was performed immediately prior to the procedure. Lesion quadrant: Upper inner Using sterile technique and 1% Lidocaine as local anesthetic, under direct ultrasound visualization, a 12 gauge spring-loaded device was used to perform biopsy of the mass in the right breast at the 12:30 position using a lateral to medial approach. At the conclusion of the procedure a ribbon shaped tissue marker clip was deployed into the biopsy cavity. Follow up 2 view mammogram was performed and dictated separately. IMPRESSION: Ultrasound guided biopsy of the mass in the right breast at the 12:30 position. No apparent complications. Electronically Signed   By: Everlean Alstrom M.D.   On: 03/21/2017 14:26    ELIGIBLE FOR AVAILABLE RESEARCH PROTOCOL:UPBEAT trial   ASSESSMENT: 51 y.o. [place] woman status post right breast upper inner quadrant biopsy March 21, 2017 for a clinical T2 N0  Stage IIB invasive ductal carcinoma, grade 3, triple negative, with an MIB-1 of 80%.  (1) neoadjuvant chemotherapy will consist of doxorubicin and cyclophosphamide in dose dense fashion x4, followed by  paclitaxel/carboplatin weekly x12  (2) definitive surgery to follow  (3) adjuvant radiation as appropriate  (4) the patient may qualify for the "upbeat" trial  PLAN: We spent the better part of today's hour-long appointment discussing the biology of her diagnosis and the specifics of her situation. We first reviewed the fact that cancer is not one disease but more than 100 different diseases and that it is important to keep them separate-- otherwise when friends and relatives discuss their own cancer experiences with Truth confusion can result. Similarly we explained that if breast cancer spreads to the bone or liver, the patient would not have bone cancer or liver cancer, but breast cancer in the bone and breast cancer in the liver: one cancer in three places-- not 3 different cancers which otherwise would have to be treated in 3 different ways.  We discussed the difference between local and systemic therapy. In terms of loco-regional treatment, lumpectomy plus radiation is equivalent to mastectomy as far as survival is concerned. For this reason, and because the cosmetic results are generally superior, we recommend breast conserving surgery. We also noted that in terms  of sequencing of treatments, whether systemic therapy or surgery is done first does not affect the ultimate outcome.  This is important in [name]'s case, since we recommend neoadjuvant chemotherapy as discussed further below.  We then discussed the rationale for systemic therapy. There is some risk that this cancer may have already spread to other parts of her body. Patients frequently ask at this point about bone scans, CAT scans and PET scans to find out if they have occult breast cancer somewhere else. The problem is that in early stage disease we are much more likely to find false positives then true cancers and this would expose the patient to unnecessary procedures as well as unnecessary radiation. Scans cannot answer the  question the patient really would like to know, which is whether she has microscopic disease elsewhere in her body. For those reasons we do not recommend them except in stage III disease.  Colletta Maryland is agreeable to this but she understands that if she really would like to have some scans done we will schedule them for her.  Of course we would proceed to aggressive evaluation of any symptoms that might suggest metastatic disease.    Next we went over the options for systemic therapy which are anti-estrogens, anti-HER-2 immunotherapy, and chemotherapy. Allyssa does not meet criteria for anti-HER-2 immunotherapy or anti-estrogens.  The only available form of systemic therapy for her is chemotherapy  Accordingly we need to optimize the chemotherapy she will receive.  This will consist of doxorubicin and cyclophosphamide and dose dense fashion x4, followed by weekly carboplatin and paclitaxel x12.  She understands the weekly treatments can be given 3 weeks on and one-week off, without compromising effectiveness.  We decided on November 15 as our target chemotherapy start date  In addition, Colletta Maryland does qualify for genetics testing.  In patients who carry a deleterious mutation [for example in a  BRCA gene], the risk of a new breast cancer developing in the future may be sufficiently great that the patient may choose bilateral mastectomies. However if she wishes to keep her breasts in that situation it is safe to do so. That would require intensified screening, which generally means not only yearly mammography but a yearly breast MRI as well. Of course, if there is a deleterious mutation bilateral oophorectomy would be necessary as there is no standard screening protocol for ovarian cancer.  We recommend neoadjuvant treatment so that Colletta Maryland can get her genetics results and make an informed opinion regarding optimal surgery for her without delaying the start of therapy.  If she did decide on bilateral  mastectomies for example she might want 1 or 2 plastic surgery opinions to discuss her multiple options.  She can do all this safely while undergoing therapy, without any sense of time pressure  Then discussed the possible toxicity side effects and complications of the chemotherapy she is going to receive.  She will also come to chemotherapy school to discuss that further.  She will need a port, and an echocardiogram.  Finally we think she would be a good candidate for the "upbeat" trial and that was discussed with her today as well.  She will see me again November 13, 2 days before her chemotherapy starts, so we can discuss the results of the above tests and how to take her supportive medications  Rhaya has a good understanding of the overall plan. She agrees with it. She knows the goal of treatment in her case is cure. She will call with any problems  that may develop before her next visit here.  Zaylen Susman, Virgie Dad, MD  03/27/17 5:31 PM Medical Oncology and Hematology [facility] [address] [place], [place] [address] Tel. [phone]    Fax. [phone]   This document serves as a record of services personally performed by Lurline Del, MD. It was created on his behalf by Steva Colder, a trained medical scribe. The creation of this record is based on the scribe's personal observations and the provider's statements to them. This document has been checked and approved by the attending provider.

## 2017-03-26 ENCOUNTER — Other Ambulatory Visit: Payer: Self-pay | Admitting: *Deleted

## 2017-03-26 DIAGNOSIS — C50411 Malignant neoplasm of upper-outer quadrant of right female breast: Secondary | ICD-10-CM

## 2017-03-26 DIAGNOSIS — Z171 Estrogen receptor negative status [ER-]: Principal | ICD-10-CM

## 2017-03-27 ENCOUNTER — Encounter: Payer: Self-pay | Admitting: Physical Therapy

## 2017-03-27 ENCOUNTER — Ambulatory Visit
Admission: RE | Admit: 2017-03-27 | Discharge: 2017-03-27 | Disposition: A | Discharge status: Home / Self Care | Payer: 59 | Source: Ambulatory Visit | Attending: Radiation Oncology | Admitting: Radiation Oncology

## 2017-03-27 ENCOUNTER — Ambulatory Visit (HOSPITAL_BASED_OUTPATIENT_CLINIC_OR_DEPARTMENT_OTHER): Payer: 59 | Admitting: Oncology

## 2017-03-27 ENCOUNTER — Ambulatory Visit: Payer: Self-pay | Admitting: Surgery

## 2017-03-27 ENCOUNTER — Other Ambulatory Visit (HOSPITAL_BASED_OUTPATIENT_CLINIC_OR_DEPARTMENT_OTHER): Payer: 59

## 2017-03-27 ENCOUNTER — Ambulatory Visit: Payer: 59 | Attending: Surgery | Admitting: Physical Therapy

## 2017-03-27 ENCOUNTER — Encounter: Payer: Self-pay | Admitting: *Deleted

## 2017-03-27 ENCOUNTER — Other Ambulatory Visit: Payer: Self-pay | Admitting: *Deleted

## 2017-03-27 ENCOUNTER — Encounter: Payer: Self-pay | Admitting: Oncology

## 2017-03-27 VITALS — BP 137/79 | HR 80 | Temp 98.2°F | Resp 18 | Ht 65.0 in | Wt 163.5 lb

## 2017-03-27 DIAGNOSIS — Z171 Estrogen receptor negative status [ER-]: Principal | ICD-10-CM

## 2017-03-27 DIAGNOSIS — C50411 Malignant neoplasm of upper-outer quadrant of right female breast: Secondary | ICD-10-CM | POA: Diagnosis not present

## 2017-03-27 DIAGNOSIS — C50211 Malignant neoplasm of upper-inner quadrant of right female breast: Secondary | ICD-10-CM

## 2017-03-27 DIAGNOSIS — Z803 Family history of malignant neoplasm of breast: Secondary | ICD-10-CM | POA: Diagnosis not present

## 2017-03-27 DIAGNOSIS — C50911 Malignant neoplasm of unspecified site of right female breast: Secondary | ICD-10-CM | POA: Diagnosis not present

## 2017-03-27 DIAGNOSIS — R293 Abnormal posture: Secondary | ICD-10-CM

## 2017-03-27 LAB — COMPREHENSIVE METABOLIC PANEL
ALT: 14 U/L (ref 0–55)
AST: 15 U/L (ref 5–34)
Albumin: 3.9 g/dL (ref 3.5–5.0)
Alkaline Phosphatase: 63 U/L (ref 40–150)
Anion Gap: 7 mEq/L (ref 3–11)
BUN: 11.7 mg/dL (ref 7.0–26.0)
CHLORIDE: 105 meq/L (ref 98–109)
CO2: 26 meq/L (ref 22–29)
Calcium: 9.2 mg/dL (ref 8.4–10.4)
Creatinine: 0.7 mg/dL (ref 0.6–1.1)
GLUCOSE: 90 mg/dL (ref 70–140)
POTASSIUM: 4.1 meq/L (ref 3.5–5.1)
SODIUM: 138 meq/L (ref 136–145)
Total Bilirubin: 0.34 mg/dL (ref 0.20–1.20)
Total Protein: 7.1 g/dL (ref 6.4–8.3)

## 2017-03-27 LAB — CBC WITH DIFFERENTIAL/PLATELET
BASO%: 0.3 % (ref 0.0–2.0)
BASOS ABS: 0 10*3/uL (ref 0.0–0.1)
EOS ABS: 0.5 10*3/uL (ref 0.0–0.5)
EOS%: 7.2 % — ABNORMAL HIGH (ref 0.0–7.0)
HCT: 38.6 % (ref 34.8–46.6)
HGB: 13.5 g/dL (ref 11.6–15.9)
LYMPH%: 27 % (ref 14.0–49.7)
MCH: 33 pg (ref 25.1–34.0)
MCHC: 34.9 g/dL (ref 31.5–36.0)
MCV: 94.5 fL (ref 79.5–101.0)
MONO#: 0.7 10*3/uL (ref 0.1–0.9)
MONO%: 9.2 % (ref 0.0–14.0)
NEUT#: 4.1 10*3/uL (ref 1.5–6.5)
NEUT%: 56.3 % (ref 38.4–76.8)
Platelets: 393 10*3/uL (ref 145–400)
RBC: 4.08 10*6/uL (ref 3.70–5.45)
RDW: 12 % (ref 11.2–14.5)
WBC: 7.3 10*3/uL (ref 3.9–10.3)
lymph#: 2 10*3/uL (ref 0.9–3.3)

## 2017-03-27 MED ORDER — DEXAMETHASONE 4 MG PO TABS: ORAL_TABLET | ORAL | 1 refills | Status: DC

## 2017-03-27 MED ORDER — LIDOCAINE-PRILOCAINE 2.5-2.5 % EX CREA: TOPICAL_CREAM | CUTANEOUS | 3 refills | Status: DC

## 2017-03-27 MED ORDER — LORAZEPAM 0.5 MG PO TABS: 0.5000 mg | ORAL_TABLET | Freq: Every evening | ORAL | 0 refills | Status: DC | PRN

## 2017-03-27 MED ORDER — PROCHLORPERAZINE MALEATE 10 MG PO TABS: 10.0000 mg | ORAL_TABLET | Freq: Four times a day (QID) | ORAL | 1 refills | Status: DC | PRN

## 2017-03-27 NOTE — Therapy (Signed)
[facility] [place], [place], [address] Phone: [phone]   Fax:  [phone]  Physical Therapy Evaluation  Patient Details  Name: Jennifer Beck MRN: [id number] Date of Birth: January 13, 1966 Referring Provider: Dr. [name]  Encounter Date: 03/27/2017      PT End of Session - 03/27/17 1107    Visit Number 1   Number of Visits 1   PT Start Time 1005   PT Stop Time 1020  Also saw pt from 1035-1043 and 1110 to 1125 for a total of 38 minutes   PT Time Calculation (min) 15 min   Activity Tolerance Patient tolerated treatment well   Behavior During Therapy [facility] for tasks assessed/performed      Past Medical History:  Diagnosis Date  . Chest pain   . Endometriosis   . Fatigue   . GI bleed    caused by ischemic colitis following an episode of hypertension  . Heart palpitations   . Hx of echocardiogram    a. echo 9/13:  EF 55-60%, Gr 2 diast dysfn  . Hypercholesterolemia   . Ischemic colitis ([place])   . Myocardial infarct ([place]) 08/26/2001   post cath - EF of 60%  . Myocarditis ([place])   . Pericarditis   . SOB (shortness of breath)     Past Surgical History:  Procedure Laterality Date  . CARDIAC CATHETERIZATION  08/26/2001   EF of 60% --- smooth & normal coronary arteries -- there is a Stawicki motion defect consistent with posterolateral MI -- she quite possibly had a coronary spasm -- she may have some pericarditis secondary to her MI   . DILATION AND CURETTAGE OF UTERUS  10/15/2003   Uterine enlargement, menorrhagia  . DILATION AND CURETTAGE OF UTERUS  05/27/2002   Dysfunctional uterine bleeding  . NOVASURE ABLATION  10/15/2003   for management of her extreme menorrhagi  . TUBAL LIGATION  09/2000   bilateral    There were no vitals filed for this visit.       Subjective Assessment - 03/27/17 1100    Subjective Patient reports she is here today at the multidisciplinary breast clinic to be seen by  her medical team for her newly diagnosed right breast cancer.   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 It measures 2.4 cm and is located in the lower inner quadrant with a Ki67 of 80%. She has no other significant medical issues.   Patient Stated Goals Reduce lymphedema risk and learn post op shoulder ROM HEP   Currently in Pain? No/denies            [facility] PT Assessment - 03/27/17 0001      Assessment   Medical Diagnosis Right breast cancer   Referring Provider Dr. [name]   Onset Date/Surgical Date 03/18/17   Hand Dominance Right   Prior Therapy none     Precautions   Precautions Other (comment)   Precaution Comments active cancer     Restrictions   Weight Bearing Restrictions No     Balance Screen   Has the patient fallen in the past 6 months No   Has the patient had a decrease in activity level because of a fear of falling?  No   Is the patient reluctant to leave their home because of a fear of falling?  No     Home Environment  Living Environment Private residence   Living Arrangements Spouse/significant other  12 y.o. daughter is at [employer] at [facility]     Prior Function   Level of Independence Independent   Vocation Full time employment   Educational psychologist   Leisure She does not exercise     Cognition   Overall Cognitive Status Within Functional Limits for tasks assessed     Posture/Postural Control   Posture/Postural Control Postural limitations   Postural Limitations Rounded Shoulders;Forward head     ROM / Strength   AROM / PROM / Strength AROM;Strength     AROM   AROM Assessment Site Shoulder;Cervical   Right/Left Shoulder Right;Left   Right Shoulder Extension 49 Degrees   Right Shoulder Flexion 145 Degrees   Right Shoulder ABduction 171 Degrees   Right Shoulder  Internal Rotation 75 Degrees   Right Shoulder External Rotation 81 Degrees   Left Shoulder Extension 35 Degrees   Left Shoulder Flexion 145 Degrees   Left Shoulder ABduction 177 Degrees   Left Shoulder Internal Rotation 72 Degrees   Left Shoulder External Rotation 85 Degrees   Cervical Flexion WNL   Cervical Extension WNL   Cervical - Right Side Bend WNL   Cervical - Left Side Bend WNL   Cervical - Right Rotation WNL   Cervical - Left Rotation WNL     Strength   Overall Strength Within functional limits for tasks performed           LYMPHEDEMA/ONCOLOGY QUESTIONNAIRE - 03/27/17 1106      Type   Cancer Type Right breast cancer     Lymphedema Assessments   Lymphedema Assessments Upper extremities     Right Upper Extremity Lymphedema   10 cm Proximal to Olecranon Process 28.3 cm   Olecranon Process 24.5 cm   10 cm Proximal to Ulnar Styloid Process 22.6 cm   Just Proximal to Ulnar Styloid Process 15.6 cm   Across Hand at [employer] 18.7 cm   At [place] of 2nd Digit 6 cm     Left Upper Extremity Lymphedema   10 cm Proximal to Olecranon Process 28.8 cm   Olecranon Process 24 cm   10 cm Proximal to Ulnar Styloid Process 21.8 cm   Just Proximal to Ulnar Styloid Process 15.5 cm   Across Hand at [employer] 19 cm   At [place] of 2nd Digit 5.8 cm         Objective measurements completed on examination: See above findings.    Patient was instructed today in a home exercise program today for post op shoulder range of motion. These included active assist shoulder flexion in sitting, scapular retraction, Schoenfeld walking with shoulder abduction, and hands behind head external rotation.  She was encouraged to do these twice a day, holding 3 seconds and repeating 5 times when permitted by her physician.         PT Education - 03/27/17 1106    Education provided Yes   Education Details Lymphedema risk reduction and post op shoulder ROM HEP   Person(s) Educated  Patient;Spouse   Methods Explanation;Demonstration;Handout   Comprehension Returned demonstration;Verbalized understanding              Breast Clinic Goals - 03/27/17 1204      Patient will be able to verbalize understanding of pertinent lymphedema risk reduction practices relevant to her diagnosis specifically related to skin care.   Time 1  Period Days   Status Achieved     Patient will be able to return demonstrate and/or verbalize understanding of the post-op home exercise program related to regaining shoulder range of motion.   Time 1   Period Days   Status Achieved     Patient will be able to verbalize understanding of the importance of attending the postoperative After Breast Cancer Class for further lymphedema risk reduction education and therapeutic exercise.   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 It measures 2.4 cm and is located in the lower inner quadrant with a Ki67 of 80%. She has no other significant medical issues. Her multidisciplinary medical team met prior to her assessments to determine a recommended treatment plan. She is planning to have neoadjuvant chemotherapy followed by a right lumpectomy and sentinel node biopsy and radiation. She may benefit from post op PT to regain shoulder ROM and reduce lymphedema risk.   History and Personal Factors relevant to plan of care: None   Clinical Presentation Stable   Clinical Decision Making Low   Rehab Potential Excellent   Clinical Impairments Affecting Rehab Potential None   PT Frequency 1x / week   PT Treatment/Interventions ADLs/Self Care Home Management;Therapeutic exercise;Patient/family education   PT Next Visit Plan Will f/u after surgery to determine PT needs   PT Home Exercise Plan Post op shoulder ROM HEP   Consulted and  Agree with Plan of Care Patient;Family member/caregiver   Family Member Consulted Husband      Patient will benefit from skilled therapeutic intervention in order to improve the following deficits and impairments:  Decreased range of motion, Impaired UE functional use, Pain, Decreased knowledge of precautions, Postural dysfunction  Visit Diagnosis: Malignant neoplasm of upper-inner quadrant of right breast in female, estrogen receptor negative ([place]) - Plan: PT plan of care cert/re-cert  Abnormal posture - Plan: PT plan of care cert/re-cert   Patient will follow up at outpatient cancer rehab 3-4 weeks following surgery.  If the patient requires physical therapy at that time, a specific plan will be dictated and sent to the referring physician for approval. The patient was educated today on appropriate basic range of motion exercises to begin post operatively and the importance of attending the After Breast Cancer class following surgery.  Patient was educated today on lymphedema risk reduction practices as it pertains to recommendations that will benefit the patient immediately following surgery.  She verbalized good understanding.      Problem List Patient Active Problem List   Diagnosis Date Noted  . Malignant neoplasm of upper-outer quadrant of right breast in female, estrogen receptor negative ([place]) 03/26/2017  . Chest tightness 03/21/2012    Annia Friendly, PT 03/27/17 12:07 PM  [facility] [place], [place], [address] Phone: [phone]   Fax:  [phone]  Name: SAFINA HUARD MRN: [id number] Date of Birth: [birth year]

## 2017-03-27 NOTE — Progress Notes (Signed)
Radiation Oncology         [phone] ________________________________  Initial outpatient Consultation  Name: Jennifer Beck MRN: [id number]  Date: 03/27/2017  DOB: 08-02-1965  HU:[id number], Arbie Cookey, NP  Juanda Chance, NP   REFERRING PHYSICIAN: Juanda Chance, NP  DIAGNOSIS:    ICD-10-CM   1. Malignant neoplasm of upper-outer quadrant of right breast in female, estrogen receptor negative ([place]) C50.411    Z17.1   Cancer Staging Malignant neoplasm of upper-outer quadrant of right breast in female, estrogen receptor negative ([place]) Staging form: Breast, AJCC 8th Edition - Clinical stage from 03/27/2017: Stage IIB (cT2, cN0, cM0, G3, ER: Negative, PR: Negative, HER2: Negative) - Unsigned     Right Breast UIQ Invasive Ductal Carcinoma, ERneg / PRneg / Her2neg, Grade III  CHIEF COMPLAINT: Here to discuss management of right breast cancer  HISTORY OF PRESENT ILLNESS::Jennifer Beck is a 51 y.o. female who presented with a palpable right breast mass. Mammogram showed increased density in this area. On [place] the axilla was neg and a 2.4 cm mass was at 12:30. Biopsy showed Grade III, triple (-) Invasive ductal carcinoma with lymphatic invasion.    On review of systems, patient endorses fatigue, but this does not affect activities. She also endorses PMS symptoms, psoriasis on hands, and easy bruising. She endorses lump in her breast.  Patient has surgical history including endometrial ablation in 2004, tubal ligation in 2002, and removal of cyst on labia on 08-03-15 that was benign (this cyst has returned).  Patient has hx of basal skin lesion removal.   Patient was 48 at age of first menstruation. She continues to have periods with last period on 10/25. She continues to endorse spotting and PMS monthly despite ablation. Patient denies hormone replacement. Patient has carried one child to term at 32 years old. She is not currently pregnant or trying to become pregnant. She endorses having used  birth control pills or hormone short for contraception from ages 60 - 19.   PREVIOUS RADIATION THERAPY: No  PAST MEDICAL HISTORY:  has a past medical history of Chest pain; Endometriosis; Fatigue; GI bleed; Heart palpitations; echocardiogram; Hypercholesterolemia; Ischemic colitis ([place]); Myocardial infarct ([place]) (08/26/2001); Myocarditis ([place]); Pericarditis; and SOB (shortness of breath).    PAST SURGICAL HISTORY: Past Surgical History:  Procedure Laterality Date  . CARDIAC CATHETERIZATION  08/26/2001   EF of 60% --- smooth & normal coronary arteries -- there is a Hollenkamp motion defect consistent with posterolateral MI -- she quite possibly had a coronary spasm -- she may have some pericarditis secondary to her MI   . DILATION AND CURETTAGE OF UTERUS  10/15/2003   Uterine enlargement, menorrhagia  . DILATION AND CURETTAGE OF UTERUS  05/27/2002   Dysfunctional uterine bleeding  . NOVASURE ABLATION  10/15/2003   for management of her extreme menorrhagi  . TUBAL LIGATION  09/2000   bilateral    FAMILY HISTORY: family history includes Breast cancer in her cousin; Cervical cancer in her maternal aunt; Colon cancer in her maternal grandfather and maternal grandmother; Coronary artery disease in her maternal grandfather, maternal uncle, maternal uncle, and mother; Diabetes in her father; Hypertension in her father; Kidney cancer in her mother.  SOCIAL HISTORY:  reports that she has never smoked. She has never used smokeless tobacco. She reports that she drinks alcohol. She reports that she does not use drugs.  ALLERGIES: Codeine and Biaxin [clarithromycin]  MEDICATIONS:  Current Outpatient Prescriptions  Medication Sig Dispense Refill  . acetaminophen (TYLENOL) 325  MG tablet Take 650 mg by mouth every 6 (six) hours as needed.    . Cholecalciferol (VITAMIN D-3 PO) Take by mouth every other day.    . Evening Primrose Oil 1000 MG CAPS Take by mouth.    . fluocinonide cream (LIDEX) 1.75 % Apply 1  application topically 2 (two) times daily.    [name] glucosamine-chondroitin 500-400 MG tablet Take 1 tablet by mouth 3 (three) times daily.    [name] ibuprofen (ADVIL,MOTRIN) 200 MG tablet Take 200 mg by mouth every 6 (six) hours as needed.    . Melatonin 5 MG TABS Take by mouth.    . Multiple Minerals-Vitamins (CALCIUM-MAGNESIUM-ZINC) TABS Take 1 tablet by mouth as needed.     . Multiple Vitamins-Minerals (MULTIVITAMINS THER. W/MINERALS) TABS Take 1 tablet by mouth as needed.     . Omega-3 Fatty Acids (FISH OIL) 1000 MG CAPS Take by mouth.    [name] VITAMIN E PO Take by mouth every other day.     No current facility-administered medications for this encounter.     REVIEW OF SYSTEMS: A 10+ POINT REVIEW OF SYSTEMS WAS OBTAINED including neurology, dermatology, psychiatry, cardiac, respiratory, lymph, extremities, GI, GU, Musculoskeletal, constitutional, breasts, reproductive, HEENT.  All pertinent positives are noted in the HPI.  All others are negative.   PHYSICAL EXAM:   General: Alert and oriented, in no acute distress HEENT: Head is normocephalic. Extraocular movements are intact. Oropharynx is clear. Neck: Neck is supple, no palpable cervical or supraclavicular lymphadenopathy. Heart: Regular in rate and rhythm with no murmurs, rubs, or gallops. Chest: Clear to auscultation bilaterally, with no rhonchi, wheezes, or rales. Abdomen: Soft, nontender, nondistended, with no rigidity or guarding. Extremities: No cyanosis or edema. Lymphatics: see Neck Exam Skin: No concerning lesions. Musculoskeletal: symmetric strength and muscle tone throughout. Neurologic: Cranial nerves II through XII are grossly intact. No obvious focalities. Speech is fluent. Coordination is intact. Psychiatric: Judgment and insight are intact. Affect is appropriate. Breasts: She has an area of bruising and a palpable mass (approximately 5 cm, could be partially hematoma) at 11 o'clock of the left breast.  No other palpable masses  appreciated in the breasts or axillae.     ECOG = 0  0 - Asymptomatic (Fully active, able to carry on all predisease activities without restriction)  1 - Symptomatic but completely ambulatory (Restricted in physically strenuous activity but ambulatory and able to carry out work of a light or sedentary nature. For example, light housework, office work)  2 - Symptomatic, <50% in bed during the day (Ambulatory and capable of all self care but unable to carry out any work activities. Up and about more than 50% of waking hours)  3 - Symptomatic, >50% in bed, but not bedbound (Capable of only limited self-care, confined to bed or chair 50% or more of waking hours)  4 - Bedbound (Completely disabled. Cannot carry on any self-care. Totally confined to bed or chair)  5 - Death   Eustace Pen MM, Creech RH, Tormey DC, et al. [phone]). "Toxicity and response criteria of the [facility] Group". [place] Oncol. 5 (6): 649-55   LABORATORY DATA:  Lab Results  Component Value Date   WBC 7.3 03/27/2017   HGB 13.5 03/27/2017   HCT 38.6 03/27/2017   MCV 94.5 03/27/2017   PLT 393 03/27/2017   CMP     Component Value Date/Time   NA 138 03/27/2017 0813   K 4.1 03/27/2017 0813   CL 106 02/05/2012  1742   CO2 26 03/27/2017 0813   GLUCOSE 90 03/27/2017 0813   BUN 11.7 03/27/2017 0813   CREATININE 0.7 03/27/2017 0813   CALCIUM 9.2 03/27/2017 0813   PROT 7.1 03/27/2017 0813   ALBUMIN 3.9 03/27/2017 0813   AST 15 03/27/2017 0813   ALT 14 03/27/2017 0813   ALKPHOS 63 03/27/2017 0813   BILITOT 0.34 03/27/2017 0813         RADIOGRAPHY: US Breast Ltd Uni Right Inc Axilla  Result Date: 03/18/2017 CLINICAL DATA:  51 year old female presenting for evaluation of a palpable mass lump in the right breast. EXAM: 2D DIGITAL DIAGNOSTIC UNILATERAL RIGHT MAMMOGRAM WITH CAD AND ADJUNCT TOMO RIGHT BREAST ULTRASOUND COMPARISON:  Previous exam(s). ACR Breast Density Category d: The breast tissue is  extremely dense, which lowers the sensitivity of mammography. FINDINGS: A BB has been placed on the upper-inner quadrant of the right breast indicating the palpable site of concern. Deep to the palpable marker on the spot compression tomosynthesis images there is an obscured mass. Mammographic images were processed with CAD. Ultrasound targeted to the right breast at 12:30, 4 cm from the nipple demonstrates an irregular hypoechoic mass with indistinct margins measuring 2.4 x 1.3 x 1.9 cm. Blood flow was identified within the mass on color Doppler imaging. Ultrasound of the right axilla demonstrates multiple normal-appearing lymph nodes. IMPRESSION: 1. The palpable right breast mass at 12:30 is indeterminate, and biopsy is warranted. 2.  No evidence of right axillary lymphadenopathy. RECOMMENDATION: Ultrasound-guided biopsy is recommended for the right breast mass. This has been scheduled for 05/21/2017 at 1:45 p.m. I have discussed the findings and recommendations with the patient. Results were also provided in writing at the conclusion of the visit. If applicable, a reminder letter will be sent to the patient regarding the next appointment. BI-RADS CATEGORY  4: Suspicious. Electronically Signed   By: Ammie Ferrier M.D.   On: 03/18/2017 14:07   Mm Diag Breast Tomo Uni Right  Result Date: 03/18/2017 CLINICAL DATA:  51 year old female presenting for evaluation of a palpable mass lump in the right breast. EXAM: 2D DIGITAL DIAGNOSTIC UNILATERAL RIGHT MAMMOGRAM WITH CAD AND ADJUNCT TOMO RIGHT BREAST ULTRASOUND COMPARISON:  Previous exam(s). ACR Breast Density Category d: The breast tissue is extremely dense, which lowers the sensitivity of mammography. FINDINGS: A BB has been placed on the upper-inner quadrant of the right breast indicating the palpable site of concern. Deep to the palpable marker on the spot compression tomosynthesis images there is an obscured mass. Mammographic images were processed with CAD.  Ultrasound targeted to the right breast at 12:30, 4 cm from the nipple demonstrates an irregular hypoechoic mass with indistinct margins measuring 2.4 x 1.3 x 1.9 cm. Blood flow was identified within the mass on color Doppler imaging. Ultrasound of the right axilla demonstrates multiple normal-appearing lymph nodes. IMPRESSION: 1. The palpable right breast mass at 12:30 is indeterminate, and biopsy is warranted. 2.  No evidence of right axillary lymphadenopathy. RECOMMENDATION: Ultrasound-guided biopsy is recommended for the right breast mass. This has been scheduled for 05/21/2017 at 1:45 p.m. I have discussed the findings and recommendations with the patient. Results were also provided in writing at the conclusion of the visit. If applicable, a reminder letter will be sent to the patient regarding the next appointment. BI-RADS CATEGORY  4: Suspicious. Electronically Signed   By: Ammie Ferrier M.D.   On: 03/18/2017 14:07   Mm Clip Placement Right  Result Date: 03/21/2017 CLINICAL DATA:  Post ultrasound-guided  biopsy of a suspicious mass in the right breast at the 12:30 position. EXAM: DIAGNOSTIC RIGHT MAMMOGRAM POST ULTRASOUND BIOPSY COMPARISON:  Previous exam(s). FINDINGS: Mammographic images were obtained following ultrasound guided biopsy of a suspicious mass in the right breast at the 12:30 position. Ribbon shaped biopsy marking clip is present at the site of the biopsied mass in the right breast. IMPRESSION: Appropriate position of ribbon shaped biopsy marking clip post ultrasound-guided biopsy of a mass in the right breast at the 12:30 position. Final Assessment: Post Procedure Mammograms for Marker Placement Electronically Signed   By: Everlean Alstrom M.D.   On: 03/21/2017 14:38   [place] Rt Breast Bx W Loc Dev 1st Lesion Img Bx Spec US Guide  Result Date: 03/21/2017 CLINICAL DATA:  51 year old female with a suspicious mass in the right breast at the 12:30 position. EXAM: ULTRASOUND GUIDED RIGHT  BREAST CORE NEEDLE BIOPSY COMPARISON:  Previous exam(s). FINDINGS: I met with the patient and we discussed the procedure of ultrasound-guided biopsy, including benefits and alternatives. We discussed the high likelihood of a successful procedure. We discussed the risks of the procedure, including infection, bleeding, tissue injury, clip migration, and inadequate sampling. Informed written consent was given. The usual time-out protocol was performed immediately prior to the procedure. Lesion quadrant: Upper inner Using sterile technique and 1% Lidocaine as local anesthetic, under direct ultrasound visualization, a 12 gauge spring-loaded device was used to perform biopsy of the mass in the right breast at the 12:30 position using a lateral to medial approach. At the conclusion of the procedure a ribbon shaped tissue marker clip was deployed into the biopsy cavity. Follow up 2 view mammogram was performed and dictated separately. IMPRESSION: Ultrasound guided biopsy of the mass in the right breast at the 12:30 position. No apparent complications. Electronically Signed   By: Everlean Alstrom M.D.   On: 03/21/2017 14:26      IMPRESSION/PLAN: right breast cancer  Tumor board recommends MRI to stage her breasts as well as genetic testing. She is a candidate for lumpectomy and sentinel lymph node biopsy. Anticipate neoadjuvant chemotherapy.  She has been discussed at our multidisciplinary tumor board.  The consensus is that she would be a good candidate for breast conservation. I talked to her about the option of a mastectomy and informed her that her expected overall survival would be equivalent between mastectomy and breast conservation, based upon randomized controlled data. She is enthusiastic about breast conservation.  It was a pleasure meeting the patient today. We discussed the risks, benefits, and side effects of radiotherapy. I recommend radiotherapy to the left breast to reduce her risk of locoregional  recurrence by 2/3.  We discussed that radiation would take approximately 4 weeks to complete and that I would give the patient a few weeks to heal following surgery before starting treatment planning. We spoke about acute effects including skin irritation and fatigue as well as much less common late effects including internal organ injury or irritation. We spoke about the latest technology that is used to minimize the risk of late effects for patients undergoing radiotherapy to the breast or chest [place]. No guarantees of treatment were given. The patient is enthusiastic about proceeding with treatment. I look forward to participating in the patient's care.  I will await her referral back to me for postoperative follow-up and eventual CT simulation/treatment planning.  __________________________________________   [name], MD  This document serves as a record of services personally performed by [name], MD. It was  created on his behalf by Linward Natal, a trained medical scribe. The creation of this record is based on the scribe's personal observations and the provider's statements to them. This document has been checked and approved by the attending provider.

## 2017-03-27 NOTE — Progress Notes (Signed)
START ON PATHWAY REGIMEN - Breast     A cycle is every 21 days:     Doxorubicin      Cyclophosphamide      Paclitaxel      Carboplatin   **Always confirm dose/schedule in your pharmacy ordering system**    Patient Characteristics: Preoperative or Nonsurgical Candidate (Clinical Staging), Neoadjuvant Therapy followed by Surgery, Invasive Disease, Chemotherapy, HER2 Negative/Unknown/Equivocal, ER Negative/Unknown, Platinum Therapy Indicated Therapeutic Status: Preoperative or Nonsurgical Candidate (Clinical Staging) AJCC M Category: cM0 AJCC Grade: G3 Breast Surgical Plan: Neoadjuvant Therapy followed by Surgery ER Status: Negative (-) AJCC 8 Stage Grouping: IIB HER2 Status: Negative (-) AJCC T Category: cT2 AJCC N Category: cN0 PR Status: Negative (-) Type of Therapy: Platinum Therapy Indicated Intent of Therapy: Curative Intent, Discussed with Patient

## 2017-03-27 NOTE — Progress Notes (Signed)
Nutrition Assessment  Reason for Assessment:  Pt seen in Breast Clinic  ASSESSMENT:   51 year old female new diagnosis of breast cancer.  Past medical history reviewed  Patient reports normal appetite.  Medications:  reviewed  Labs: reviewed  Anthropometrics:   Height: 65 inches Weight: 163 lb 8 oz BMI: 27.2   NUTRITION DIAGNOSIS: Food and nutrition related knowledge deficit related to new diagnosis of breast cancer as evidenced by no prior need for nutrition related information.  INTERVENTION:  Discussed and provided packet of information regarding nutritional tips for breast cancer patients.  Questions answered.  Teachback method used.  Contact information provided and patient knows to contact me with questions/concerns.    MONITORING, EVALUATION, and GOAL: Pt will consume a healthy plant based diet to maintain lean body mass throughout treatment.   Tayton Decaire B. Zenia Resides, [place], [place] Registered Dietitian [phone] (pager)

## 2017-03-27 NOTE — Addendum Note (Signed)
Addended by: Chauncey Cruel on: 03/27/2017 05:51 PM   Modules accepted: Orders

## 2017-03-27 NOTE — Patient Instructions (Signed)

## 2017-03-28 ENCOUNTER — Encounter: Payer: Self-pay | Admitting: *Deleted

## 2017-03-28 ENCOUNTER — Ambulatory Visit (HOSPITAL_COMMUNITY)
Admission: RE | Admit: 2017-03-28 | Discharge: 2017-03-28 | Disposition: A | Discharge status: Home / Self Care | Payer: 59 | Source: Ambulatory Visit | Attending: Oncology | Admitting: Oncology

## 2017-03-28 ENCOUNTER — Encounter: Payer: Self-pay | Admitting: General Practice

## 2017-03-28 DIAGNOSIS — Z171 Estrogen receptor negative status [ER-]: Secondary | ICD-10-CM | POA: Insufficient documentation

## 2017-03-28 DIAGNOSIS — C50411 Malignant neoplasm of upper-outer quadrant of right female breast: Secondary | ICD-10-CM | POA: Diagnosis present

## 2017-03-28 DIAGNOSIS — N6489 Other specified disorders of breast: Secondary | ICD-10-CM | POA: Diagnosis not present

## 2017-03-28 DIAGNOSIS — R9389 Abnormal findings on diagnostic imaging of other specified body structures: Secondary | ICD-10-CM | POA: Diagnosis not present

## 2017-03-28 MED ORDER — GADOBENATE DIMEGLUMINE 529 MG/ML IV SOLN
15.0000 mL | Freq: Once | INTRAVENOUS | Status: AC | PRN
  Administered 2017-03-28: 15 mL via INTRAVENOUS

## 2017-03-28 NOTE — Progress Notes (Signed)
[place] Psychosocial Distress Screening Spiritual Care  Reached [place] by phone following Breast Multidisciplinary Clinic to introduce [place] team/resources, reviewing distress screen per protocol.  The patient scored a 4 on the Psychosocial Distress Thermometer which indicates moderate distress. Also assessed for distress and other psychosocial needs.   ONCBCN DISTRESS SCREENING 03/28/2017  Screening Type Initial Screening  Distress experienced in past week (1-10) 4  Practical problem type Work/school  Emotional problem type Nervousness/Anxiety  Referral to support programs Yes   Aldena reports that she is understandably overwhelmed by appointments and information, but overall coping ok.  One current stressor is trying to field all the well wishes and generic offers of help before she knows what she really needs.  [place] team/programming resources with her.  Suggested delegating some communication to a trusted person to enable her to focus on tx.  Follow up needed: Yes.  Samai welcomes f/u call prior to starting chemo and has full packet of [employer].  She knows to call anytime, but please also page if immediate needs arise.  Thank you.   [place], [place], [facility] Pager [phone] Voicemail [phone]

## 2017-03-29 ENCOUNTER — Telehealth: Payer: Self-pay | Admitting: Oncology

## 2017-03-29 ENCOUNTER — Ambulatory Visit (HOSPITAL_COMMUNITY): Admission: RE | Admit: 2017-03-29 | Payer: 59 | Source: Ambulatory Visit

## 2017-03-29 ENCOUNTER — Encounter (HOSPITAL_COMMUNITY): Payer: Self-pay | Admitting: *Deleted

## 2017-03-29 NOTE — Telephone Encounter (Signed)
Scheduled appt per 10/31 los - patient is aware and will come pick up and updated schedule next visit 11/8

## 2017-04-01 ENCOUNTER — Ambulatory Visit (HOSPITAL_COMMUNITY)
Admission: RE | Admit: 2017-04-01 | Discharge: 2017-04-01 | Disposition: A | Discharge status: Home / Self Care | Payer: 59 | Source: Ambulatory Visit | Attending: Oncology | Admitting: Oncology

## 2017-04-01 ENCOUNTER — Encounter (HOSPITAL_COMMUNITY)
Admission: RE | Admit: 2017-04-01 | Discharge: 2017-04-01 | Disposition: A | Discharge status: Home / Self Care | Payer: 59 | Source: Ambulatory Visit | Attending: Surgery | Admitting: Surgery

## 2017-04-01 ENCOUNTER — Encounter (INDEPENDENT_AMBULATORY_CARE_PROVIDER_SITE_OTHER): Payer: Self-pay

## 2017-04-01 DIAGNOSIS — K559 Vascular disorder of intestine, unspecified: Secondary | ICD-10-CM | POA: Insufficient documentation

## 2017-04-01 DIAGNOSIS — R002 Palpitations: Secondary | ICD-10-CM | POA: Insufficient documentation

## 2017-04-01 DIAGNOSIS — I319 Disease of pericardium, unspecified: Secondary | ICD-10-CM | POA: Diagnosis not present

## 2017-04-01 DIAGNOSIS — E7800 Pure hypercholesterolemia, unspecified: Secondary | ICD-10-CM | POA: Diagnosis not present

## 2017-04-01 DIAGNOSIS — I514 Myocarditis, unspecified: Secondary | ICD-10-CM | POA: Diagnosis not present

## 2017-04-01 DIAGNOSIS — Z01810 Encounter for preprocedural cardiovascular examination: Secondary | ICD-10-CM | POA: Diagnosis not present

## 2017-04-01 DIAGNOSIS — K219 Gastro-esophageal reflux disease without esophagitis: Secondary | ICD-10-CM | POA: Insufficient documentation

## 2017-04-01 DIAGNOSIS — C50411 Malignant neoplasm of upper-outer quadrant of right female breast: Secondary | ICD-10-CM | POA: Diagnosis not present

## 2017-04-01 DIAGNOSIS — K922 Gastrointestinal hemorrhage, unspecified: Secondary | ICD-10-CM | POA: Insufficient documentation

## 2017-04-01 DIAGNOSIS — Z171 Estrogen receptor negative status [ER-]: Secondary | ICD-10-CM

## 2017-04-01 DIAGNOSIS — R5383 Other fatigue: Secondary | ICD-10-CM | POA: Insufficient documentation

## 2017-04-01 NOTE — H&P (Signed)
Jennifer Beck  Location: [facility] Patient #: [id number] DOB: [birth year] Undefined / Language: Jennifer Beck / Race: White Female  History of Present Illness   The patient is a 51 year old female who presents with a complaint of right breast cancer.  The PCP is Dr. Driscilla Beck [facility])  Gyn - Dr. Garth Schlatter, NP  The patient was referred by Dr. Gladstone Beck  The pateint is at the [facility] Oncology is Drs. Jennifer Beck and Jennifer Beck  She comes with her husband.  She had a negative mammogram in March 2018. She felt a mass in her right breast above her nipple in September. She did have repeat mammograms at the breast center. She's had an ablation, but still has the symptoms of having periods. She is not on hormone replacement medicine.  Mammograms: [facility], 03/18/2017 - Ultrasound targeted to the right breast at 12:30, 4 cm from the nipple demonstrates an irregular hypoechoic mass with indistinct margins measuring 2.4 x 1.3 x 1.9 cm. Biopsy: 03/21/2017 at 12:30 o'clock [phone]) - IDC, Grade 3, Triple negative, Ki67 - 80% Family history of breast or ovarian cancer: Mother's neice On hormone therapy: No  I discussed the options for breast cancer treatment with the patient. The patient is at the [facility], which includes medical oncology and radiation oncology. I discussed the surgical options of lumpectomy vs. mastectomy. If mastectomy, there is the possibility of reconstruction. I discussed the options of lymph node biopsy. The treatment plan depends on the pathologic staging of the tumor and the patient's personal wishes. The risks of surgery include, but are not limited to, bleeding, infection, the need for further surgery, and nerve injury. The patient has been given literature on the treatment of breast cancer.  Plan: 1) Needs MRI for dense breast, 2) Neoadjuvant chemotherapy, 3)  Power port placement, 4) Genetics, 5) then lumpectomy and SLNBx (depending on genetics)  I discussed the indications and potential complications of the power port placement. The primary complications of the power port, include, but are not limited to, bleeding, infection, nerve injury, thrombosis, and pneumothorax.  Past Medical History: 1. 2 laparoscopies for endometriosis - Dr. Susette Beck She still has some symptoms 2. Pericarditis - about 2003 She ahd a normal cardiac cath She saw Dr. Rae Beck, but last saw him in 2013  Social History: Married, Jennifer Beck 1 daughter - Jennifer Beck - Jennifer Beck student works for insurance benefits - Jennifer Beck and Jennifer Beck   Past Surgical History Jennifer Pummel, RN; 03/27/2017 7:28 AM) Breast Biopsy  Right. Oral Surgery   Diagnostic Studies History Jennifer Pummel, RN; 03/27/2017 7:28 AM) Colonoscopy  >10 years ago Mammogram  within last year Pap Smear  1-5 years ago  Medication History Jennifer Pummel, RN; 03/27/2017 7:29 AM) Medications Reconciled  Social History Jennifer Pummel, RN; 03/27/2017 7:28 AM) Alcohol use  Occasional alcohol use. Caffeine use  Coffee, Tea. No drug use  Tobacco use  Never smoker.  Family History Jennifer Pummel, RN; 03/27/2017 7:28 AM) Arthritis  Mother. Bleeding disorder  Daughter. Breast Cancer  Family Members In General. Cancer  Father, Mother. Cervical Cancer  Family Members In General. Diabetes Mellitus  Father, Mother. Hypertension  Mother. Thyroid problems  Mother.  Pregnancy / Birth History Jennifer Pummel, RN; 03/27/2017 7:28 AM) Age at menarche  68 years. Contraceptive History  Oral contraceptives. Gravida  1 Maternal age  4-30 Para  1  Other Problems Jennifer Pummel, RN; 03/27/2017 7:28 AM) General anesthesia - complications  Lump In Breast  Melanoma  Other disease, cancer, significant illness     Review of Systems Sunday Spillers Ledford RN; 03/27/2017 7:28  AM) General Not Present- Appetite Loss, Chills, Fatigue, Fever, Night Sweats, Weight Gain and Weight Loss. Skin Present- Dryness. Not Present- Change in Wart/Mole, Hives, Jaundice, New Lesions, Non-Healing Wounds, Rash and Ulcer. HEENT Present- Wears glasses/contact lenses. Not Present- Earache, Hearing Loss, Hoarseness, Nose Bleed, Oral Ulcers, Ringing in the Ears, Seasonal Allergies, Sinus Pain, Sore Throat, Visual Disturbances and Yellow Eyes. Respiratory Not Present- Bloody sputum, Chronic Cough, Difficulty Breathing, Snoring and Wheezing. Breast Present- Breast Mass. Not Present- Breast Pain, Nipple Discharge and Skin Changes. Cardiovascular Not Present- Chest Pain, Difficulty Breathing Lying Down, Leg Cramps, Palpitations, Rapid Heart Rate, Shortness of Breath and Swelling of Extremities. Gastrointestinal Present- Constipation. Not Present- Abdominal Pain, Bloating, Bloody Stool, Change in Bowel Habits, Chronic diarrhea, Difficulty Swallowing, Excessive gas, Gets full quickly at meals, Hemorrhoids, Indigestion, Nausea, Rectal Pain and Vomiting. Female Genitourinary Not Present- Frequency, Nocturia, Painful Urination, Pelvic Pain and Urgency. Musculoskeletal Not Present- Back Pain, Joint Pain, Joint Stiffness, Muscle Pain, Muscle Weakness and Swelling of Extremities. Neurological Not Present- Decreased Memory, Fainting, Headaches, Numbness, Seizures, Tingling, Tremor, Trouble walking and Weakness. Psychiatric Not Present- Anxiety, Bipolar, Change in Sleep Pattern, Depression, Fearful and Frequent crying. Endocrine Not Present- Cold Intolerance, Excessive Hunger, Hair Changes, Heat Intolerance, Hot flashes and New Diabetes. Hematology Present- Easy Bruising. Not Present- Blood Thinners, Excessive bleeding, Gland problems, HIV and Persistent Infections.   Physical Exam  General: WN WF alert and generally healthy appearing. She has somewhat whitish hair. Skin: Inspection and palpation of the  skin unremarkable.  Eyes: Conjunctivae white, pupils equal. Face, ears, nose, mouth, and throat: Face - normal. Normal ears and nose. Lips and teeth normal.  Neck: Supple. No mass. Trachea midline. No thyroid mass.  Lymph Nodes: No supraclavicular or cervical adenopathy. No axillary adenopathy.  Lungs: Normal respiratory effort. Clear to auscultation and symmetric breath sounds. Cardiovascular: Regular rate and rythm. Normal auscultation of the heart. No murmur or rub.  Breasts: Right - Nodular area at 12 o'clock, just above the nipple - with bruising from biopsy Left - No mass or nodule  Abdomen: Soft. No mass. Liver and spleen not palpable. No tenderness. No hernia. Normal bowel sounds.  Scar at umbilicus Rectal: Not done.  Musculoskeletal/extremities: Normal gait. Good strength and ROM in upper and lower extremities.   Neurologic: Grossly intact to motor and sensory function.   Psychiatric: Has normal mood and affect. Judgement and insight appear normal.  Assessment & Plan  1.  BREAST CANCER, STAGE 2, RIGHT (C50.911)  Story: Biopsy: 03/21/2017 at 12:30 o'clock ([id number]) - IDC, Grade 3, Triple negative, Ki67 - 80%  Oncology - Magriant and Jennifer Beck  Plan:   1) Needs MRI for dense breast, Breast MRI - 03/29/2017 - 1. Biopsy-proven malignancy involving the upper inner quadrant of the right breast measuring approximately 1.8 x 1.7 x 2.5 cm.                   2. 2 indeterminate foci of non mass enhancement involving the lower outer quadrant of the right breast at anterior and posterior depth. Measurements are given above. The overall span of non mass enhancement in the lower outer quadrant measures approximately 7 cm.            3. Indeterminate focus of non mass enhancement involving the upper outer quadrant of the left breast at middle depth. Measurements given  above.         4. Indeterminate mass (maximum measurement 0.8  cm) involving the upper inner quadrant of the left breast at anterior to middle depth at the approximate 12:30 o'clock position.         5. Non mass enhancement involving the outer right breast at posterior depth at the approximate 9 o'clock position and in the upper outer quadrant of the left breast at middle depth which demonstrate benign enhancement kinetics and therefore likely represent functional breast tissue.         6. No pathologic lymphadenopathy.             (Extreme background parenchymal enhancement lowers the specificity of MRI, therefore, any of these areas of non mass enhancement may represent functioning breast tissue.  RECOMMENDATION:     1. MRI biopsy of the 2 indeterminate foci of non mass enhancement involving the lower outer quadrant of the right breast at both anterior and posterior depth (if persistent).      2. MRI biopsy of the indeterminate focus of non mass enhancement involving the upper outer quadrant of the left breast and the indeterminate mass involving the upper inner quadrant of the left breast (if persistent).              The MRI biopsy should be scheduled between day 7 and 14 of the patient's menstrual cycle in order to minimize background enhancement.    2) Neoadjuvant chemotherapy,   3) Power port placement,   4) Genetics,   5) then lumpectomy and SLNBx (depending on genetics)  2. 2 laparoscopies for endometriosis - Dr. Susette Beck She still has some symptoms 3. Pericarditis - about 2003  She ahd a normal cardiac cath  She saw Dr. Rae Beck, but last saw him in 2013   [name], MD, [facility] Pager: [phone] Office phone:  [phone]

## 2017-04-01 NOTE — Progress Notes (Signed)
  Echocardiogram 2D Echocardiogram has been performed.  Jennifer Beck F 04/01/2017, 10:01 AM

## 2017-04-02 ENCOUNTER — Other Ambulatory Visit (HOSPITAL_COMMUNITY): Payer: 59

## 2017-04-02 ENCOUNTER — Other Ambulatory Visit: Payer: Self-pay

## 2017-04-02 ENCOUNTER — Encounter (HOSPITAL_COMMUNITY): Payer: Self-pay | Admitting: Registered Nurse

## 2017-04-02 ENCOUNTER — Ambulatory Visit (HOSPITAL_COMMUNITY): Payer: 59

## 2017-04-02 ENCOUNTER — Other Ambulatory Visit: Payer: 59

## 2017-04-02 ENCOUNTER — Ambulatory Visit (HOSPITAL_COMMUNITY): Payer: 59 | Admitting: Registered Nurse

## 2017-04-02 ENCOUNTER — Ambulatory Visit (HOSPITAL_COMMUNITY)
Admission: RE | Admit: 2017-04-02 | Discharge: 2017-04-02 | Disposition: A | Discharge status: Home / Self Care | Payer: 59 | Source: Ambulatory Visit | Attending: Surgery | Admitting: Surgery

## 2017-04-02 ENCOUNTER — Encounter (HOSPITAL_COMMUNITY)
Admission: RE | Disposition: A | Discharge status: Home / Self Care | Payer: Self-pay | Source: Ambulatory Visit | Attending: Surgery

## 2017-04-02 DIAGNOSIS — Z8041 Family history of malignant neoplasm of ovary: Secondary | ICD-10-CM | POA: Insufficient documentation

## 2017-04-02 DIAGNOSIS — Z85820 Personal history of malignant melanoma of skin: Secondary | ICD-10-CM | POA: Insufficient documentation

## 2017-04-02 DIAGNOSIS — Z171 Estrogen receptor negative status [ER-]: Secondary | ICD-10-CM | POA: Diagnosis not present

## 2017-04-02 DIAGNOSIS — Z885 Allergy status to narcotic agent status: Secondary | ICD-10-CM | POA: Diagnosis not present

## 2017-04-02 DIAGNOSIS — Z881 Allergy status to other antibiotic agents status: Secondary | ICD-10-CM | POA: Insufficient documentation

## 2017-04-02 DIAGNOSIS — Z4682 Encounter for fitting and adjustment of non-vascular catheter: Secondary | ICD-10-CM | POA: Diagnosis not present

## 2017-04-02 DIAGNOSIS — C50411 Malignant neoplasm of upper-outer quadrant of right female breast: Secondary | ICD-10-CM | POA: Diagnosis not present

## 2017-04-02 DIAGNOSIS — Z79899 Other long term (current) drug therapy: Secondary | ICD-10-CM | POA: Insufficient documentation

## 2017-04-02 DIAGNOSIS — C50911 Malignant neoplasm of unspecified site of right female breast: Secondary | ICD-10-CM | POA: Insufficient documentation

## 2017-04-02 DIAGNOSIS — Z95828 Presence of other vascular implants and grafts: Secondary | ICD-10-CM

## 2017-04-02 DIAGNOSIS — Z452 Encounter for adjustment and management of vascular access device: Secondary | ICD-10-CM | POA: Diagnosis not present

## 2017-04-02 HISTORY — DX: Adverse effect of unspecified anesthetic, initial encounter: T41.45XA

## 2017-04-02 HISTORY — DX: Other specified postprocedural states: Z98.890

## 2017-04-02 HISTORY — PX: PORTACATH PLACEMENT: SHX2246

## 2017-04-02 HISTORY — DX: Nausea with vomiting, unspecified: R11.2

## 2017-04-02 HISTORY — DX: Other complications of anesthesia, initial encounter: T88.59XA

## 2017-04-02 HISTORY — DX: Gastro-esophageal reflux disease without esophagitis: K21.9

## 2017-04-02 LAB — HCG, SERUM, QUALITATIVE: Preg, Serum: NEGATIVE

## 2017-04-02 SURGERY — INSERTION, TUNNELED CENTRAL VENOUS DEVICE, WITH PORT
Anesthesia: General | Site: Chest | Laterality: Right

## 2017-04-02 MED ORDER — FENTANYL CITRATE (PF) 100 MCG/2ML IJ SOLN
INTRAMUSCULAR | Status: AC
  Filled 2017-04-02: qty 2

## 2017-04-02 MED ORDER — HYDROCODONE-ACETAMINOPHEN 5-325 MG PO TABS
1.0000 | ORAL_TABLET | Freq: Four times a day (QID) | ORAL | Status: DC | PRN
  Administered 2017-04-02: 1 via ORAL
  Filled 2017-04-02: qty 1

## 2017-04-02 MED ORDER — DEXAMETHASONE SODIUM PHOSPHATE 10 MG/ML IJ SOLN
INTRAMUSCULAR | Status: AC
  Filled 2017-04-02: qty 1

## 2017-04-02 MED ORDER — MIDAZOLAM HCL 5 MG/5ML IJ SOLN
INTRAMUSCULAR | Status: DC | PRN
  Administered 2017-04-02: 2 mg via INTRAVENOUS

## 2017-04-02 MED ORDER — BUPIVACAINE-EPINEPHRINE (PF) 0.25% -1:200000 IJ SOLN
INTRAMUSCULAR | Status: DC | PRN
  Administered 2017-04-02: 14 mL

## 2017-04-02 MED ORDER — HEPARIN SOD (PORK) LOCK FLUSH 100 UNIT/ML IV SOLN
INTRAVENOUS | Status: DC | PRN
  Administered 2017-04-02: 400 [IU] via INTRAVENOUS

## 2017-04-02 MED ORDER — CEFAZOLIN SODIUM-DEXTROSE 2-4 GM/100ML-% IV SOLN
2.0000 g | INTRAVENOUS | Status: AC
  Administered 2017-04-02: 2 g via INTRAVENOUS
  Filled 2017-04-02: qty 100

## 2017-04-02 MED ORDER — ONDANSETRON HCL 4 MG/2ML IJ SOLN
INTRAMUSCULAR | Status: AC
  Filled 2017-04-02: qty 2

## 2017-04-02 MED ORDER — HYDROCODONE-ACETAMINOPHEN 5-325 MG PO TABS: 1.0000 | ORAL_TABLET | Freq: Four times a day (QID) | ORAL | 0 refills | Status: DC | PRN

## 2017-04-02 MED ORDER — ONDANSETRON HCL 4 MG/2ML IJ SOLN
INTRAMUSCULAR | Status: DC | PRN
  Administered 2017-04-02: 4 mg via INTRAVENOUS

## 2017-04-02 MED ORDER — GABAPENTIN 300 MG PO CAPS
300.0000 mg | ORAL_CAPSULE | ORAL | Status: AC
  Administered 2017-04-02: 300 mg via ORAL
  Filled 2017-04-02: qty 1

## 2017-04-02 MED ORDER — SCOPOLAMINE 1 MG/3DAYS TD PT72
MEDICATED_PATCH | TRANSDERMAL | Status: AC
  Filled 2017-04-02: qty 1

## 2017-04-02 MED ORDER — BUPIVACAINE-EPINEPHRINE (PF) 0.25% -1:200000 IJ SOLN
INTRAMUSCULAR | Status: AC
  Filled 2017-04-02: qty 30

## 2017-04-02 MED ORDER — FENTANYL CITRATE (PF) 100 MCG/2ML IJ SOLN
INTRAMUSCULAR | Status: DC | PRN
  Administered 2017-04-02: 25 ug via INTRAVENOUS
  Administered 2017-04-02: 50 ug via INTRAVENOUS
  Administered 2017-04-02: 25 ug via INTRAVENOUS

## 2017-04-02 MED ORDER — PROPOFOL 10 MG/ML IV BOLUS
INTRAVENOUS | Status: AC
  Filled 2017-04-02: qty 20

## 2017-04-02 MED ORDER — HEPARIN SOD (PORK) LOCK FLUSH 100 UNIT/ML IV SOLN
INTRAVENOUS | Status: AC
  Filled 2017-04-02: qty 5

## 2017-04-02 MED ORDER — LIDOCAINE 2% (20 MG/ML) 5 ML SYRINGE
INTRAMUSCULAR | Status: DC | PRN
  Administered 2017-04-02: 100 mg via INTRAVENOUS

## 2017-04-02 MED ORDER — DEXAMETHASONE SODIUM PHOSPHATE 10 MG/ML IJ SOLN
INTRAMUSCULAR | Status: DC | PRN
  Administered 2017-04-02: 10 mg via INTRAVENOUS

## 2017-04-02 MED ORDER — LIDOCAINE 2% (20 MG/ML) 5 ML SYRINGE
INTRAMUSCULAR | Status: AC
  Filled 2017-04-02: qty 5

## 2017-04-02 MED ORDER — SODIUM CHLORIDE 0.9 % IV SOLN
Freq: Once | INTRAVENOUS | Status: DC
  Filled 2017-04-02: qty 1.2

## 2017-04-02 MED ORDER — CHLORHEXIDINE GLUCONATE CLOTH 2 % EX PADS: 6.0000 | MEDICATED_PAD | Freq: Once | CUTANEOUS | Status: DC

## 2017-04-02 MED ORDER — KETOROLAC TROMETHAMINE 30 MG/ML IJ SOLN: 30.0000 mg | Freq: Once | INTRAMUSCULAR | Status: DC | PRN

## 2017-04-02 MED ORDER — ACETAMINOPHEN 500 MG PO TABS
1000.0000 mg | ORAL_TABLET | ORAL | Status: AC
  Administered 2017-04-02: 1000 mg via ORAL
  Filled 2017-04-02: qty 2

## 2017-04-02 MED ORDER — LACTATED RINGERS IV SOLN
INTRAVENOUS | Status: DC
  Administered 2017-04-02: 08:00:00 via INTRAVENOUS

## 2017-04-02 MED ORDER — FENTANYL CITRATE (PF) 100 MCG/2ML IJ SOLN: 25.0000 ug | INTRAMUSCULAR | Status: DC | PRN

## 2017-04-02 MED ORDER — LIDOCAINE HCL (PF) 1 % IJ SOLN
INTRAMUSCULAR | Status: AC
  Filled 2017-04-02: qty 30

## 2017-04-02 MED ORDER — SCOPOLAMINE 1 MG/3DAYS TD PT72
MEDICATED_PATCH | TRANSDERMAL | Status: DC | PRN
  Administered 2017-04-02: 1 via TRANSDERMAL

## 2017-04-02 MED ORDER — MIDAZOLAM HCL 2 MG/2ML IJ SOLN
INTRAMUSCULAR | Status: AC
  Filled 2017-04-02: qty 2

## 2017-04-02 MED ORDER — PROPOFOL 10 MG/ML IV BOLUS
INTRAVENOUS | Status: DC | PRN
  Administered 2017-04-02: 160 mg via INTRAVENOUS

## 2017-04-02 MED ORDER — PROMETHAZINE HCL 25 MG/ML IJ SOLN: 6.2500 mg | INTRAMUSCULAR | Status: DC | PRN

## 2017-04-02 SURGICAL SUPPLY — 32 items
ADH SKN CLS APL DERMABOND .7 (GAUZE/BANDAGES/DRESSINGS) ×1
APL SKNCLS STERI-STRIP NONHPOA (GAUZE/BANDAGES/DRESSINGS)
BAG DECANTER FOR FLEXI CONT (MISCELLANEOUS) ×3 IMPLANT
BENZOIN TINCTURE PRP APPL 2/3 (GAUZE/BANDAGES/DRESSINGS) IMPLANT
BLADE SURG 15 STRL LF DISP TIS (BLADE) ×1 IMPLANT
BLADE SURG 15 STRL SS (BLADE) ×3
CHLORAPREP W/TINT 26ML (MISCELLANEOUS) ×3 IMPLANT
COVER PROBE U/S 5X48 (MISCELLANEOUS) ×2 IMPLANT
COVER SURGICAL LIGHT HANDLE (MISCELLANEOUS) ×3 IMPLANT
DECANTER SPIKE VIAL GLASS SM (MISCELLANEOUS) ×1 IMPLANT
DERMABOND ADVANCED (GAUZE/BANDAGES/DRESSINGS) ×2
DERMABOND ADVANCED .7 DNX12 (GAUZE/BANDAGES/DRESSINGS) IMPLANT
DRAPE C-ARM 42X120 X-RAY (DRAPES) ×3 IMPLANT
DRAPE LAPAROSCOPIC ABDOMINAL (DRAPES) ×3 IMPLANT
ELECT PENCIL ROCKER SW 15FT (MISCELLANEOUS) ×3 IMPLANT
ELECT REM PT RETURN 15FT ADLT (MISCELLANEOUS) ×3 IMPLANT
GAUZE SPONGE 2X2 8PLY STRL LF (GAUZE/BANDAGES/DRESSINGS) IMPLANT
GAUZE SPONGE 4X4 16PLY XRAY LF (GAUZE/BANDAGES/DRESSINGS) ×3 IMPLANT
GLOVE SURG SIGNA 7.5 PF LTX (GLOVE) ×3 IMPLANT
GOWN STRL REUS W/TWL XL LVL3 (GOWN DISPOSABLE) ×6 IMPLANT
KIT BASIN OR (CUSTOM PROCEDURE TRAY) ×3 IMPLANT
KIT POWER CATH 8FR (Port) ×2 IMPLANT
NDL HYPO 25X1 1.5 SAFETY (NEEDLE) ×1 IMPLANT
NEEDLE HYPO 25X1 1.5 SAFETY (NEEDLE) ×3 IMPLANT
PACK BASIC VI WITH GOWN DISP (CUSTOM PROCEDURE TRAY) ×3 IMPLANT
SPONGE GAUZE 2X2 STER 10/PKG (GAUZE/BANDAGES/DRESSINGS)
SUT MNCRL AB 4-0 PS2 18 (SUTURE) ×3 IMPLANT
SUT VIC AB 3-0 SH 18 (SUTURE) ×3 IMPLANT
SYR 10ML ECCENTRIC (SYRINGE) ×3 IMPLANT
SYR CONTROL 10ML LL (SYRINGE) ×3 IMPLANT
TOWEL OR 17X26 10 PK STRL BLUE (TOWEL DISPOSABLE) ×3 IMPLANT
TOWEL OR NON WOVEN STRL DISP B (DISPOSABLE) ×1 IMPLANT

## 2017-04-02 NOTE — Anesthesia Procedure Notes (Signed)
Procedure Name: LMA Insertion Date/Time: 04/02/2017 10:04 AM Performed by: Talbot Grumbling, CRNA Pre-anesthesia Checklist: Patient identified, Suction available, Patient being monitored and Emergency Drugs available Patient Re-evaluated:Patient Re-evaluated prior to induction Oxygen Delivery Method: Circle system utilized Preoxygenation: Pre-oxygenation with 100% oxygen Induction Type: IV induction Ventilation: Mask ventilation without difficulty LMA: LMA inserted LMA Size: 4.0 Number of attempts: 1 Placement Confirmation: positive ETCO2 and breath sounds checked- equal and bilateral Tube secured with: Tape Dental Injury: Teeth and Oropharynx as per pre-operative assessment

## 2017-04-02 NOTE — Op Note (Addendum)
04/02/2017  10:59 AM  PATIENT:  Jennifer Beck, 51 y.o., female MRN: [id number] DOB: November 18, 1965  PREOP DIAGNOSIS:  RIGHT BREAST CANCER, anticipate chemotherapy  POSTOP DIAGNOSIS:   Right breast cancer, anticipate chemotherapy  PROCEDURE:   Procedure(s):  INSERTION Power port, right internal jugular vein, US guided  SURGEON:   [name], M.D.  ANESTHESIA:   general  Anesthesiologist: Myrtie Soman, MD CRNA: Talbot Grumbling, CRNA  General  EBL:  minimal  ml  COUNTS CORRECT:  YES  INDICATIONS FOR PROCEDURE:  Jennifer Beck is a 51 y.o. (DOB: 05-10-1966) white female whose primary care physician is Schoenhoff, Altamese Cabal, MD and comes for power port placement for the treatment of right breast cancer.  Dr. Jana Hakim is her treating oncologist.   She was seen at the [facility] with Drs. Magrinat and Isidore Moos and has triple negative right breast cancer.  She is going to get neoadjuvant chemotherapy.   The indications and risks of the surgery were explained to the patient.  The risks include, but are not limited to, infection, bleeding, pneumothorax, nerve injury, and thrombosis of the vein.  OPERATIVE NOTE:  The patient was taken to Room #8 at [facility].  Anesthesia was provided by Anesthesiologist: Myrtie Soman, MD CRNA: Talbot Grumbling, CRNA.  At the beginning of the operation, the patient was given 2 gm Ancef, had a roll placed under her back, and had the upper chest/neck prepped with Chloroprep and draped.   A time out was held and the surgery checklist reviewed.   The patient was placed in Trendelenburg position.  I tried to access the left subclavian vein, but did not get back flow.  Therefore I positioned the patient for a right internal jugular approach.  The right IJ was accessed with a 16 gauge needle under US guidance and a guide wire threaded through the needle into the vein.  The position of the wire was checked with fluoroscopy.   I then developed a pocket in  the upper inner aspect of the right chest for the port reservoir.  I used the [employer] for venous access.  The reservoir was sewn in place with a 3-0 Vicryl suture.  The reservoir had been flushed with dilute (10 units/cc) heparin.   I then passed the silastic tubing from the reservoir incision to the subclavian incision to the right IJ site and used the 8 French introducer to pass it into the vein.  The tip of the silastic catheter was position at the junction of the SVC and the right atrium under fluoroscopy.  The silastic catheter was then attached to the port with the bayonet device.     The entire port and tubing were checked with fluoroscopy and then the port was flushed with 4 cc of concentrated heparin (100 units/cc).  I infiltrated 14 cc of 1/4% marcaine.   The wounds were then closed with 3-0 vicryl subcutaneous sutures and the skin closed with a 4-0 Monocryl suture.  The skin was painted with DermaBond.   The patient was transferred to the recovery room in good condition.  The sponge and needle count were correct at the end of the case.  A CXR is ordered for port placement and pending at the time of this note.  [name], MD, [facility] Pager: [phone] Office phone:  [phone]

## 2017-04-02 NOTE — Discharge Instructions (Signed)
[facility] - DISCHARGE INSTRUCTIONS TO PATIENT  Activity:  Driving - May drive tomorrow, if doing well and no pain meds   Lifting - Take it easy for 5 days, then no limit  Wound Care:   Leave incision dry for 2 days, then may remove bandage and shower  Diet:  As tolerated  Follow up appointment:  Call Dr. Pollie Friar office [facility]) at [phone] for an appointment in 2 months.  Medications and dosages:  Resume your home medications.  You have a prescription for:  Vicodin  Call Dr. Lucia Gaskins or his office  [phone]) if you have:  Temperature greater than 100.4,  Persistent nausea and vomiting,  Severe uncontrolled pain,  Redness, tenderness, or signs of infection (pain, swelling, redness, odor or green/yellow discharge around the site),  Difficulty breathing, headache or visual disturbances,  Any other questions or concerns you may have after discharge.  In an emergency, call 911 or go to an Emergency Department at a nearby hospital.    General Anesthesia, Adult, Care After These instructions provide you with information about caring for yourself after your procedure. Your health care provider may also give you more specific instructions. Your treatment has been planned according to current medical practices, but problems sometimes occur. Call your health care provider if you have any problems or questions after your procedure. What can I expect after the procedure? After the procedure, it is common to have:  Vomiting.  A sore throat.  Mental slowness.  It is common to feel:  Nauseous.  Cold or shivery.  Sleepy.  Tired.  Sore or achy, even in parts of your body where you did not have surgery.  Follow these instructions at home: For at least 24 hours after the procedure:  Do not: ? Participate in activities where you could fall or become injured. ? Drive. ? Use heavy machinery. ? Drink alcohol. ? Take sleeping pills or  medicines that cause drowsiness. ? Make important decisions or sign legal documents. ? Take care of children on your own.  Rest. Eating and drinking  If you vomit, drink water, juice, or soup when you can drink without vomiting.  Drink enough fluid to keep your urine clear or pale yellow.  Make sure you have little or no nausea before eating solid foods.  Follow the diet recommended by your health care provider. General instructions  Have a responsible adult stay with you until you are awake and alert.  Return to your normal activities as told by your health care provider. Ask your health care provider what activities are safe for you.  Take over-the-counter and prescription medicines only as told by your health care provider.  If you smoke, do not smoke without supervision.  Keep all follow-up visits as told by your health care provider. This is important. Contact a health care provider if:  You continue to have nausea or vomiting at home, and medicines are not helpful.  You cannot drink fluids or start eating again.  You cannot urinate after 8-12 hours.  You develop a skin rash.  You have fever.  You have increasing redness at the site of your procedure. Get help right away if:  You have difficulty breathing.  You have chest pain.  You have unexpected bleeding.  You feel that you are having a life-threatening or urgent problem. This information is not intended to replace advice given to you by your health care provider. Make sure you discuss any questions you have with  your health care provider. Document Released: 08/20/2000 Document Revised: 10/17/2015 Document Reviewed: 04/28/2015 Elsevier Interactive Patient Education  [employer].

## 2017-04-02 NOTE — Interval H&P Note (Signed)
History and Physical Interval Note:  04/02/2017 9:53 AM  Jennifer Beck  has presented today for surgery, with the diagnosis of RIGHT BREAST CANCER  The various methods of treatment have been discussed with the patient and family.   Her husband is with her.  Will give her copy of her MRI.  After consideration of risks, benefits and other options for treatment, the patient has consented to  Procedure(s): INSERTION PORT-A-CATH (N/A) as a surgical intervention .  The patient's history has been reviewed, patient examined, no change in status, stable for surgery.  I have reviewed the patient's chart and labs.  Questions were answered to the patient's satisfaction.     Cailah Reach H

## 2017-04-02 NOTE — Anesthesia Postprocedure Evaluation (Signed)
Anesthesia Post Note  Patient: Jennifer Beck  Procedure(s) Performed: INSERTION PORT-A-CATH (Right Chest)     Patient location during evaluation: PACU Anesthesia Type: General Level of consciousness: awake and alert Pain management: pain level controlled Vital Signs Assessment: post-procedure vital signs reviewed and stable Respiratory status: spontaneous breathing, nonlabored ventilation, respiratory function stable and patient connected to nasal cannula oxygen Cardiovascular status: blood pressure returned to baseline and stable Postop Assessment: no apparent nausea or vomiting Anesthetic complications: no    Last Vitals:  Vitals:   04/02/17 1130 04/02/17 1145  BP: 127/73 116/65  Pulse: 87 82  Resp: 12 11  Temp:  (!) 36.4 C  SpO2: 99% 98%    Last Pain:  Vitals:   04/02/17 1145  TempSrc:   PainSc: 4                  Jahnyla Parrillo S

## 2017-04-02 NOTE — Anesthesia Preprocedure Evaluation (Signed)
Anesthesia Evaluation  Patient identified by MRN, date of birth, ID band Patient awake    Reviewed: Allergy & Precautions, NPO status , [name] Chart, lab work & pertinent test results  History of Anesthesia Complications (+) PONV  Airway Mallampati: II  TM Distance: >3 FB Neck ROM: Full    Dental no notable dental hx.    Pulmonary neg pulmonary ROS,    Pulmonary exam normal breath sounds clear to auscultation       Cardiovascular negative cardio ROS Normal cardiovascular exam Rhythm:Regular Rate:Normal     Neuro/Psych negative neurological ROS  negative psych ROS   GI/Hepatic negative GI ROS, Neg liver ROS,   Endo/Other  negative endocrine ROS  Renal/GU negative Renal ROS  negative genitourinary   Musculoskeletal negative musculoskeletal ROS (+)   Abdominal   Peds negative pediatric ROS (+)  Hematology negative hematology ROS (+)   Anesthesia Other Findings   Reproductive/Obstetrics negative OB ROS                             Anesthesia Physical Anesthesia Plan  ASA: II  Anesthesia Plan: General   Post-op Pain Management:    Induction: Intravenous  PONV Risk Score and Plan: 4 or greater and Treatment may vary due to age or medical condition, Dexamethasone, Ondansetron and Scopolamine patch - Pre-op  Airway Management Planned: LMA  Additional Equipment:   Intra-op Plan:   Post-operative Plan: Extubation in OR  Informed Consent: I have reviewed the patients History and Physical, chart, labs and discussed the procedure including the risks, benefits and alternatives for the proposed anesthesia with the patient or authorized representative who has indicated his/her understanding and acceptance.   Dental advisory given  Plan Discussed with: CRNA and Surgeon  Anesthesia Plan Comments:         Anesthesia Quick Evaluation

## 2017-04-02 NOTE — Transfer of Care (Signed)
Immediate Anesthesia Transfer of Care Note  Patient: Jennifer Beck  Procedure(s) Performed: INSERTION PORT-A-CATH (Right Chest)  Patient Location: PACU  Anesthesia Type:General  Level of Consciousness: sedated  Airway & Oxygen Therapy: Patient Spontanous Breathing and Patient connected to face mask oxygen  Post-op Assessment: Report given to RN and Post -op Vital signs reviewed and stable  Post vital signs: Reviewed and stable  Last Vitals:  Vitals:   04/02/17 0748  BP: 115/69  Pulse: 93  Resp: 16  Temp: 36.8 C  SpO2: 100%    Last Pain:  Vitals:   04/02/17 0748  TempSrc: Oral      Patients Stated Pain Goal: 3 (80/99/83 3825)  Complications: No apparent anesthesia complications

## 2017-04-03 ENCOUNTER — Encounter (HOSPITAL_COMMUNITY): Payer: Self-pay | Admitting: Surgery

## 2017-04-03 NOTE — Progress Notes (Signed)
[place]  [phone]) [phone] [phone]     ID: Jennifer Beck DOB: January 06, 1966  MR#: [id number]  IRW#:[id number]  Patient Care Team: [name], MD as PCP - General (Internal Medicine) [name], MD as Consulting Physician (Dermatology) [name], DC as Referring Physician (Chiropractic Medicine) [name], MD as Consulting Physician (General Surgery) [name], MD as Attending Physician (Radiation Oncology) [name], MD as Consulting Physician (Obstetrics and Gynecology) OTHER MD:  CHIEF COMPLAINT: Triple negative breast cancer  CURRENT TREATMENT: Neoadjuvant chemotherapy   HISTORY OF CURRENT ILLNESS:  From the original intake note:  Jennifer Beck palpated a mass in her right breast sometime in September 2018.[name] She brought this to medical attention and her PCP, [name], set her up on 03/18/2017 for a unilateral right diagnostic mammography with ultrasonography, showing: Breast density D. The palpable right breast mass at 12:30 was indeterminate, and biopsy was warranted at that time. No evidence of right axillary lymphadenopathy was noted. Biopsy of the lesion in question on 03/21/2017 at the right breast 12:30 position showed ([id number])  invasive ductal carcinoma with lymphovascular invasion.  The tumor was grade 3, triple negative, with an MIB-1 of 80%.  She is accompanied by her husband, Jennifer Beck to the office today. She reports that she is doing well overall. She initially felt a lump in September in the shower and notes that she doesn't complete monthly self breast exams. She had a routine mammogram in March 2018 at [facility] and she is followed by Dr. [name].  As far as surgeries, she has had two laparoscopic procedures for endometriosis as well as a bilateral tubal ligation. She still has occasional cramping, painful ovulation, and vaginal spotting that comes and goes. She has had cyst  to her breast before that have been evaluated and ruled as negative. She notes that she still has her tonsils, gallbladder, and appendix. She has a prior hx of pericarditis in 2003 that she notes was brought onby a virus. She has since been cleared by her prior Cardiologist.   She has a Restaurant manager, fast food, [name] that she sees every 5 weeks for maintenance of her neck and back issues. She has a dermatologist, [name] at [facility] and she has had an biopsy of an area from her left chest that resulted as basal cell carcinoma in 2015. She denies having a GI specialist, Cardiologist, or Orthopedist at this time. She denies prior history of seizures, migraines, acid reflux, asthma, emphysema, palpitations or GI issues.    [name] is as detailed below.  INTERVAL HISTORY: Jennifer Beck returns today for follow-up and treatment of her triple negative breast cancer, accompanied by her husband Jennifer Beck.   Since her last visit to the office, she has undergone a bilateral breast MRI with and without contrast on 03/28/2017 revealing: Biopsy-proven malignancy involving the upper inner quadrant of the right breast measuring approximately 1.8 x 1.7 x 2.5 cm.  2 indeterminate foci of non mass enhancement involving the lower outer quadrant of the right breast at anterior and posterior depth. The overall span of non mass enhancement in the lower outer quadrant measures approximately 7 cm. Indeterminate focus of non mass enhancement involving the upper outer quadrant of the left breast at middle depth. Indeterminate mass (maximum measurement 0.8 cm) involving the upper inner quadrant of the left breast at anterior to middle depth at the approximate 12:30 o'clock position. Non mass enhancement involving the outer right breast  at posterior depth at the approximate 9 o'clock position and in the upper outer quadrant of the left breast at middle depth which demonstrate benign enhancement  kinetics and therefore likely represent functional breast tissue. No pathologic lymphadenopathy  She will need biopsy of the additional suspicious areas in her MRI.  This has been scheduled for 04/11/2017.  However she is also scheduled for her first cycle of cyclophosphamide and doxorubicin that day.  She underwent port placement April 02, 2017 without event.  Finally she also had an echocardiogram April 01, 2017 which shows an ejection fraction in the 55-60% range.   REVIEW OF SYSTEMS:  Jennifer Beck reports mild soreness in her left chest. She feels tightness in her chest upon turning her head to the left. She takes Advil or Tylenol for pain relief. She decided not to participate in the ECO available study because she felt overwhelmed. She can work from home when she is able, and her job has been accomidating to her condition. She denies unusual headaches, visual changes, nausea, vomiting, or dizziness. There has been no unusual cough, phlegm production, or pleurisy. This been no change in bowel or bladder habits. She denies unexplained fatigue or unexplained weight loss, bleeding, rash, or fever. A detailed review of systems was otherwise stable.    PAST MEDICAL HISTORY: Past Medical History:  Diagnosis Date  . Chest pain   . Complication of anesthesia   . Endometriosis   . Fatigue   . GERD (gastroesophageal reflux disease)   . GI bleed    caused by ischemic colitis following an episode of hypertension  . Heart palpitations   . Hx of echocardiogram    a. echo 9/13:  EF 55-60%, Gr 2 diast dysfn  . Hypercholesterolemia   . Ischemic colitis ([place])   . 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 Myocarditis ([place])   . Pericarditis   . PONV (postoperative nausea and vomiting)   . SOB (shortness of breath)     PAST SURGICAL HISTORY: Past Surgical History:  Procedure Laterality Date  . CARDIAC CATHETERIZATION  08/26/2001   EF of 60%  --- smooth & normal coronary arteries -- there is a Zwicker motion defect consistent with posterolateral MI -- she quite possibly had a coronary spasm -- she may have some pericarditis secondary to her MI   . DILATION AND CURETTAGE OF UTERUS  10/15/2003   Uterine enlargement, menorrhagia  . DILATION AND CURETTAGE OF UTERUS  05/27/2002   Dysfunctional uterine bleeding  . LAPAROSCOPY    . NOVASURE ABLATION  10/15/2003   for management of her extreme menorrhagi  . TUBAL LIGATION  09/2000   bilateral    FAMILY HISTORY Family History  Problem Relation Age of Onset  . Hypertension Father   . Diabetes Father   . Coronary artery disease Mother   . Kidney cancer Mother   . Coronary artery disease Maternal Grandfather   . Colon cancer Maternal Grandfather   . Coronary artery disease Maternal Uncle   . Coronary artery disease Maternal Uncle   . Colon cancer Maternal Grandmother   . Breast cancer Cousin   . Cervical cancer Maternal Aunt    Her father died last 18-Feb-2016 at age 82 just 68 week shy of his 83rd birthday. Her mother is 32 years old as of October 2018. Patient has one brother and no sisters. Her mother was diagnosed with kidney cancer at age 64 with a  nephrectomy following. Her maternal grandmother was diagnosed with colon cancer at age 70.  The patient paternal grandfather was diagnosed with colon cancer in his 58's. She has paternal cousins through her fathers half-sisters that have been diagnosed with breast cancer, she is unsure of the number of breast cancer occurrences due to the distance . A maternal cousin was diagnosed with breast cancer at age 29 and she had genetic testing. The patient does have a copy of this testing as well as her PCP.  This was not available for review on the nasal  GYNECOLOGIC HISTORY:  No LMP recorded. Patient has had an ablation.   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 Her husband Jennifer Beck is a Airline pilot. Her daughter [name] is 31 and currently a sophomore at [employer]. 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  The patient attends [employer]. She lives in [place].      ADVANCED DIRECTIVES:    HEALTH MAINTENANCE: Social History   Tobacco Use  . Smoking status: Never Smoker  . Smokeless tobacco: Never Used  Substance Use Topics  . Alcohol use: Yes    Comment: Occasionally drinks wine  . Drug use: No     Colonoscopy: Yes, 2003  PAP: March 2018   Bone density: N/A   Allergies  Allergen Reactions  . Codeine Itching  . Biaxin [Clarithromycin]     Heart Burn    Current Outpatient Medications  Medication Sig Dispense Refill  . acetaminophen (TYLENOL) 325 MG tablet Take 650 mg by mouth every 6 (six) hours as needed.    [name] acetaminophen (TYLENOL) 500 MG tablet Take 1,000 mg by mouth every 8 (eight) hours as needed for mild pain or headache.    . ALPRAZolam (XANAX) 0.25 MG tablet Take 0.25 mg by mouth daily as needed for anxiety.    . Cholecalciferol (VITAMIN D) 2000 units CAPS Take 2,000 Units by mouth daily.    [name] dexamethasone (DECADRON) 4 MG tablet Take 2 tablets by mouth once a day on the day after chemotherapy and then take 2 tablets two times a day for 2 days. Take with food. 30 tablet 1  . Evening Primrose Oil 1000 MG CAPS Take 1 capsule by mouth every evening.     . fluocinonide cream (LIDEX) 1.61 % Apply 1 application topically 2 (two) times daily as needed.     [name] glucosamine-chondroitin 500-400 MG tablet Take 1 tablet by mouth daily.     [name] HYDROcodone-acetaminophen (NORCO/VICODIN) 5-325 MG tablet Take 1-2 tablets every 6 (six) hours as needed by mouth for moderate pain. 15 tablet 0  . ibuprofen (ADVIL,MOTRIN) 200  MG tablet Take 400 mg by mouth every 8 (eight) hours as needed for headache or mild pain.     [name] lidocaine-prilocaine (EMLA) cream Apply to affected area once 30 g 3  . LORazepam (ATIVAN) 0.5 MG tablet Take 1 tablet (0.5 mg total) by mouth at bedtime as needed (Nausea or vomiting). 30 tablet 0  . Melatonin 5 MG TABS Take 5 mg by mouth at bedtime as needed (sleep).     . Multiple Minerals-Vitamins (CALCIUM-MAGNESIUM-ZINC) TABS Take 1 tablet by mouth daily.     [name]  Multiple Vitamins-Minerals (MULTIVITAMINS THER. W/MINERALS) TABS Take 1 tablet by mouth daily.     . Omega-3 Fatty Acids (FISH OIL) 1000 MG CAPS Take 1,000 mg by mouth 2 (two) times a week.     Vladimir Faster Glycol-Propyl Glycol (SYSTANE OP) Apply 1 drop to eye 2 (two) times daily.    . prochlorperazine (COMPAZINE) 10 MG tablet Take 1 tablet (10 mg total) by mouth every 6 (six) hours as needed (Nausea or vomiting). 30 tablet 1  . VITAMIN E PO Take 1 tablet by mouth daily.      No current facility-administered medications for this visit.     OBJECTIVE: Middle-aged white woman who appears well Vitals:   04/09/17 0844  BP: (!) 120/52  Pulse: 86  Resp: 18  Temp: 98.2 F (36.8 C)  SpO2: 100%     Body mass index is 26.94 kg/m.   Wt Readings from Last 3 Encounters:  04/09/17 161 lb 14.4 oz (73.4 kg)  04/02/17 160 lb 3.2 oz (72.7 kg)  03/27/17 163 lb 8 oz (74.2 kg)      ECOG FS:0 - Asymptomatic  Sclerae unicteric, EOMs intact Oropharynx clear and moist No cervical or supraclavicular adenopathy Lungs no rales or rhonchi Heart regular rate and rhythm Abd soft, nontender, positive bowel sounds MSK no focal spinal tenderness, no upper extremity lymphedema Neuro: nonfocal, well oriented, appropriate affect Breasts: The mass in the upper slightly outer right breast is easily palpable.  It is movable.  It is not associated with any skin changes.  Both axillae are benign.  LAB RESULTS:  CMP     Component Value Date/Time   NA 138  03/27/2017 0813   K 4.1 03/27/2017 0813   CL 106 02/05/2012 1742   CO2 26 03/27/2017 0813   GLUCOSE 90 03/27/2017 0813   BUN 11.7 03/27/2017 0813   CREATININE 0.7 03/27/2017 0813   CALCIUM 9.2 03/27/2017 0813   PROT 7.1 03/27/2017 0813   ALBUMIN 3.9 03/27/2017 0813   AST 15 03/27/2017 0813   ALT 14 03/27/2017 0813   ALKPHOS 63 03/27/2017 0813   BILITOT 0.34 03/27/2017 0813    No results found for: TOTALPROTELP, ALBUMINELP, A1GS, A2GS, BETS, BETA2SER, GAMS, MSPIKE, SPEI  No results found for: KPAFRELGTCHN, LAMBDASER, [facility]  Lab Results  Component Value Date   WBC 7.3 03/27/2017   NEUTROABS 4.1 03/27/2017   HGB 13.5 03/27/2017   HCT 38.6 03/27/2017   MCV 94.5 03/27/2017   PLT 393 03/27/2017    '@LASTCHEMISTRY' @  No results found for: LABCA2  No components found for: [id number]  No results for input(s): INR in the last 168 hours.  No results found for: LABCA2  No results found for: DUK025  No results found for: KYH062  No results found for: BJS283  No results found for: CA2729  No components found for: HGQUANT  No results found for: CEA1 / No results found for: CEA1   No results found for: AFPTUMOR  No results found for: CHROMOGRNA  No results found for: PSA1  No visits with results within 3 Day(s) from this visit.  Latest known visit with results is:  Admission on 04/02/2017, Discharged on 04/02/2017  Component Date Value Ref Range Status  . Preg, Serum 04/02/2017 NEGATIVE  NEGATIVE Final   Comment:        THE SENSITIVITY OF THIS METHODOLOGY IS >10 mIU/mL.     (this displays the last labs from the last 3 days)  No results found for: TOTALPROTELP, ALBUMINELP,  A1GS, A2GS, BETS, BETA2SER, GAMS, MSPIKE, SPEI (this displays SPEP labs)  No results found for: KPAFRELGTCHN, LAMBDASER, KAPLAMBRATIO (kappa/lambda light chains)  No results found for: HGBA, HGBA2QUANT, HGBFQUANT, HGBSQUAN (Hemoglobinopathy evaluation)   No results found for:  LDH  No results found for: IRON, TIBC, IRONPCTSAT (Iron and TIBC)  No results found for: FERRITIN  Urinalysis No results found for: COLORURINE, APPEARANCEUR, LABSPEC, PHURINE, GLUCOSEU, HGBUR, BILIRUBINUR, KETONESUR, PROTEINUR, UROBILINOGEN, NITRITE, LEUKOCYTESUR   STUDIES: Mr Breast Bilateral W Wo Contrast  Result Date: 03/29/2017 CLINICAL DATA:  51 year old with recent biopsy-proven triple negative invasive ductal carcinoma with lymphovascular invasion involving the upper inner quadrant of the right breast, presenting for evaluation prior to neoadjuvant chemotherapy. LABS:  Not applicable. EXAM: BILATERAL BREAST MRI WITH AND WITHOUT CONTRAST TECHNIQUE: Multiplanar, multisequence MR images of both breasts were obtained prior to and following the intravenous administration of 15 ml of MultiHance. THREE-DIMENSIONAL MR IMAGE RENDERING ON INDEPENDENT WORKSTATION: Three-dimensional MR images were rendered by post-processing of the original MR data on an independent workstation. The three-dimensional MR images were interpreted, and findings are reported in the following complete MRI report for this study. Three dimensional images were evaluated at the independent DynaCad workstation. COMPARISON:  No prior MRI. Mammography 03/21/2017 (right), 03/18/2017 (right), 08/15/2016 (bilateral) and earlier. Right breast ultrasound 03/21/2017, 03/18/2017. FINDINGS: All measurements will be given as:  AP X WIDTH X CRANIOCAUDAL. Breast composition: d. Extreme fibroglandular tissue. Background parenchymal enhancement: Extensive. Right breast: Enhancing mass involving the upper inner quadrant measuring approximately 1.8 x 1.7 x 2.5 cm, within which is blooming artifact from the biopsy marker clip, demonstrating plateau and washout kinetics, corresponding to the biopsy-proven malignancy. Clumped non mass enhancement involving the lower outer quadrant at anterior depth measuring approximately 3.2 x 2.0 x 2.5 cm,  demonstrating plateau and washout kinetics (series 10601, images 161-173). Linear non mass enhancement involving the lower outer quadrant at posterior depth measuring approximately 1.7 x 0.5 x 0.8 cm, demonstrating plateau kinetics (series 10601, images 143-149). In total, the non mass enhancement in the lower outer quadrant spans approximately 7 cm. Non mass enhancement involving the outer right breast at posterior depth, approximate 9 o'clock location, measuring approximately 0.9 x 0.5 x 0.8 cm, demonstrating benign progressive kinetics (series 10601, images 106-113). Left breast: Linear non mass enhancement involving the upper outer quadrant at middle depth measuring approximately 1.2 x 0.3 x 0.5 cm, demonstrating washout kinetics (series 10601, images 91-101). Enhancing mass involving the upper inner quadrant at anterior to middle depth, at the approximate 12:30 o'clock position, measuring 0.8 x 0.3 x 0.6 cm, demonstrating washout kinetics (series 10601, images 65-71). Non mass enhancement involving the upper outer quadrant at middle depth measuring 1.5 x 1.0 x 0.8 cm, demonstrating benign progressive kinetics (series 10601, images 73- 82). Lymph nodes: No pathologic lymphadenopathy. Ancillary findings:  None. IMPRESSION: 1. Biopsy-proven malignancy involving the upper inner quadrant of the right breast measuring approximately 1.8 x 1.7 x 2.5 cm. 2. 2 indeterminate foci of non mass enhancement involving the lower outer quadrant of the right breast at anterior and posterior depth. Measurements are given above. The overall span of non mass enhancement in the lower outer quadrant measures approximately 7 cm. 3. Indeterminate focus of non mass enhancement involving the upper outer quadrant of the left breast at middle depth. Measurements given above. 4. Indeterminate mass (maximum measurement 0.8 cm) involving the upper inner quadrant of the left breast at anterior to middle depth at the approximate 12:30 o'clock  position. 5. Non  mass enhancement involving the outer right breast at posterior depth at the approximate 9 o'clock position and in the upper outer quadrant of the left breast at middle depth which demonstrate benign enhancement kinetics and therefore likely represent functional breast tissue. 6. No pathologic lymphadenopathy. (Extreme background parenchymal enhancement lowers the specificity of MRI, therefore, any of these areas of non mass enhancement may represent functioning breast tissue. RECOMMENDATION: 1. MRI biopsy of the 2 indeterminate foci of non mass enhancement involving the lower outer quadrant of the right breast at both anterior and posterior depth (if persistent). 2. MRI biopsy of the indeterminate focus of non mass enhancement involving the upper outer quadrant of the left breast and the indeterminate mass involving the upper inner quadrant of the left breast (if persistent). The MRI biopsy should be scheduled between day 7 and 14 of the patient's menstrual cycle in order to minimize background enhancement. BI-RADS CATEGORY  4: Suspicious. Electronically Signed   By: Evangeline Dakin M.D.   On: 03/29/2017 09:27   Dg Chest Port 1 View  Result Date: 04/02/2017 CLINICAL DATA:  Postop for Port-A-Cath placement.  Breast cancer. EXAM: PORTABLE CHEST 1 VIEW COMPARISON:  Breast MR 03/28/2017 FINDINGS: Right-sided Port-A-Cath terminates at the high SVC. Midline trachea. Normal heart size and mediastinal contours. No pleural effusion or pneumothorax. Clear lungs. IMPRESSION: Right-sided Port-A-Cath terminating at the high SVC; no pneumothorax. Electronically Signed   By: Abigail Miyamoto M.D.   On: 04/02/2017 11:43   Dg C-arm 1-60 Min-no Report  Result Date: 04/02/2017 Fluoroscopy was utilized by the requesting physician.  No radiographic interpretation.   US Breast Ltd Uni Right Inc Axilla  Result Date: 03/18/2017 CLINICAL DATA:  51 year old female presenting for evaluation of a palpable mass lump  in the right breast. EXAM: 2D DIGITAL DIAGNOSTIC UNILATERAL RIGHT MAMMOGRAM WITH CAD AND ADJUNCT TOMO RIGHT BREAST ULTRASOUND COMPARISON:  Previous exam(s). ACR Breast Density Category d: The breast tissue is extremely dense, which lowers the sensitivity of mammography. FINDINGS: A BB has been placed on the upper-inner quadrant of the right breast indicating the palpable site of concern. Deep to the palpable marker on the spot compression tomosynthesis images there is an obscured mass. Mammographic images were processed with CAD. Ultrasound targeted to the right breast at 12:30, 4 cm from the nipple demonstrates an irregular hypoechoic mass with indistinct margins measuring 2.4 x 1.3 x 1.9 cm. Blood flow was identified within the mass on color Doppler imaging. Ultrasound of the right axilla demonstrates multiple normal-appearing lymph nodes. IMPRESSION: 1. The palpable right breast mass at 12:30 is indeterminate, and biopsy is warranted. 2.  No evidence of right axillary lymphadenopathy. RECOMMENDATION: Ultrasound-guided biopsy is recommended for the right breast mass. This has been scheduled for 05/21/2017 at 1:45 p.m. I have discussed the findings and recommendations with the patient. Results were also provided in writing at the conclusion of the visit. If applicable, a reminder letter will be sent to the patient regarding the next appointment. BI-RADS CATEGORY  4: Suspicious. Electronically Signed   By: Ammie Ferrier M.D.   On: 03/18/2017 14:07   Mm Diag Breast Tomo Uni Right  Result Date: 03/18/2017 CLINICAL DATA:  51 year old female presenting for evaluation of a palpable mass lump in the right breast. EXAM: 2D DIGITAL DIAGNOSTIC UNILATERAL RIGHT MAMMOGRAM WITH CAD AND ADJUNCT TOMO RIGHT BREAST ULTRASOUND COMPARISON:  Previous exam(s). ACR Breast Density Category d: The breast tissue is extremely dense, which lowers the sensitivity of mammography. FINDINGS: A BB has been  placed on the upper-inner  quadrant of the right breast indicating the palpable site of concern. Deep to the palpable marker on the spot compression tomosynthesis images there is an obscured mass. Mammographic images were processed with CAD. Ultrasound targeted to the right breast at 12:30, 4 cm from the nipple demonstrates an irregular hypoechoic mass with indistinct margins measuring 2.4 x 1.3 x 1.9 cm. Blood flow was identified within the mass on color Doppler imaging. Ultrasound of the right axilla demonstrates multiple normal-appearing lymph nodes. IMPRESSION: 1. The palpable right breast mass at 12:30 is indeterminate, and biopsy is warranted. 2.  No evidence of right axillary lymphadenopathy. RECOMMENDATION: Ultrasound-guided biopsy is recommended for the right breast mass. This has been scheduled for 05/21/2017 at 1:45 p.m. I have discussed the findings and recommendations with the patient. Results were also provided in writing at the conclusion of the visit. If applicable, a reminder letter will be sent to the patient regarding the next appointment. BI-RADS CATEGORY  4: Suspicious. Electronically Signed   By: Ammie Ferrier M.D.   On: 03/18/2017 14:07   Mm Clip Placement Right  Result Date: 03/21/2017 CLINICAL DATA:  Post ultrasound-guided biopsy of a suspicious mass in the right breast at the 12:30 position. EXAM: DIAGNOSTIC RIGHT MAMMOGRAM POST ULTRASOUND BIOPSY COMPARISON:  Previous exam(s). FINDINGS: Mammographic images were obtained following ultrasound guided biopsy of a suspicious mass in the right breast at the 12:30 position. Ribbon shaped biopsy marking clip is present at the site of the biopsied mass in the right breast. IMPRESSION: Appropriate position of ribbon shaped biopsy marking clip post ultrasound-guided biopsy of a mass in the right breast at the 12:30 position. Final Assessment: Post Procedure Mammograms for Marker Placement Electronically Signed   By: Everlean Alstrom M.D.   On: 03/21/2017 14:38   [place]  Rt Breast Bx W Loc Dev 1st Lesion Img Bx Spec US Guide  Addendum Date: 04/02/2017   ADDENDUM REPORT: 03/22/2017 12:55 ADDENDUM: Pathology revealed GRADE III INVASIVE DUCTAL CARCINOMA, LYMPHOVASCULAR INVASION IS IDENTIFIED of the Right breast, 12:30 o'clock. This was found to be concordant by Dr. Everlean Alstrom. Pathology results were discussed with the patient by telephone. The patient reported doing well after the biopsy with tenderness and minimal bleeding at the site. Post biopsy instructions and care were reviewed and questions were answered. The patient was encouraged to call [facility] for any additional concerns. The patient was referred to [facility] on March 27, 2017. Recommendation for bilateral breast MRI due to extremely dense breast tissue. Pathology results reported by Terie Purser, RN on 03/22/2017. Electronically Signed   By: Everlean Alstrom M.D.   On: 03/22/2017 12:55   Result Date: 04/02/2017 CLINICAL DATA:  51 year old female with a suspicious mass in the right breast at the 12:30 position. EXAM: ULTRASOUND GUIDED RIGHT BREAST CORE NEEDLE BIOPSY COMPARISON:  Previous exam(s). FINDINGS: I met with the patient and we discussed the procedure of ultrasound-guided biopsy, including benefits and alternatives. We discussed the high likelihood of a successful procedure. We discussed the risks of the procedure, including infection, bleeding, tissue injury, clip migration, and inadequate sampling. Informed written consent was given. The usual time-out protocol was performed immediately prior to the procedure. Lesion quadrant: Upper inner Using sterile technique and 1% Lidocaine as local anesthetic, under direct ultrasound visualization, a 12 gauge spring-loaded device was used to perform biopsy of the mass in the right breast at the  12:30 position using a lateral to medial approach. At the  conclusion of the procedure a ribbon shaped tissue marker clip was deployed into the biopsy cavity. Follow up 2 view mammogram was performed and dictated separately. IMPRESSION: Ultrasound guided biopsy of the mass in the right breast at the 12:30 position. No apparent complications. Electronically Signed: By: Everlean Alstrom M.D. On: 03/21/2017 14:26    ELIGIBLE FOR AVAILABLE RESEARCH PROTOCOL:UPBEAT trial --patient refused  ASSESSMENT: 51 y.o. [place] woman status post right breast upper inner quadrant biopsy March 21, 2017 for a clinical T2 N0  Stage IIB invasive ductal carcinoma, grade 3, triple negative, with an MIB-1 of 80%.  (a) biopsy of 2 additional suspicious areas in the right breast and one in the left breast scheduled for April 11, 2017  (1) neoadjuvant chemotherapy will consist of doxorubicin and cyclophosphamide in dose dense fashion x4, followed by paclitaxel/carboplatin weekly x12  (2) definitive surgery to follow  (3) adjuvant radiation as appropriate  (4) genetics testing scheduled for 04/25/2017  PLAN: I spent approximately 30 minutes with Jennifer Beck with most of that time spent discussing her complex problems.  We reviewed her echocardiogram, which is very favorable, and discussed some of the minor discomfort that she is experiencing from the port.  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  She will have additional breast biopsies bilaterally 04/11/2017.  She did not want to move her chemotherapy to a different day but wants to get on with it so we are continuing with day 1 being 04/11/2017 as planned  Today I gave her a "roadmap" on how to take her supportive medications.  She has all the medications on hand and we reviewed the roadmap in detail.  I wrote her a prescription for cranial prosthesis to use at her discretion  She tells me her cousin was found to have a check to mutation.  Of course we are waiting on her own genetics testing  but that may affect her definitive surgical choices  Otherwise she will have her first treatment to 2 days from now.  She will see [place] again a week later to make sure she tolerated that well and to review side effects.  She will then see me on the day of her second cycle.  She knows to call for any problems that may develop before that visit    Magrinat, Virgie Dad, MD  04/09/17 8:55 AM Medical Oncology and Hematology [facility] [place], [place] [address] Tel. [phone]    Fax. [phone]  This document serves as a record of services personally performed by Lurline Del, MD. It was created on his behalf by Sheron Nightingale, a trained medical scribe. The creation of this record is based on the scribe's personal observations and the provider's statements to them.   I have reviewed the above documentation for accuracy and completeness, and I agree with the above.

## 2017-04-04 ENCOUNTER — Other Ambulatory Visit: Payer: 59

## 2017-04-04 ENCOUNTER — Encounter: Payer: Self-pay | Admitting: *Deleted

## 2017-04-04 ENCOUNTER — Other Ambulatory Visit: Payer: Self-pay | Admitting: Surgery

## 2017-04-04 ENCOUNTER — Other Ambulatory Visit: Payer: Self-pay | Admitting: Oncology

## 2017-04-04 DIAGNOSIS — C50919 Malignant neoplasm of unspecified site of unspecified female breast: Secondary | ICD-10-CM

## 2017-04-05 ENCOUNTER — Telehealth: Payer: Self-pay | Admitting: Oncology

## 2017-04-05 ENCOUNTER — Telehealth: Payer: Self-pay | Admitting: *Deleted

## 2017-04-05 NOTE — Telephone Encounter (Signed)
  Oncology Nurse Navigator Documentation  Navigator Location: [facility] (04/05/17 0900)   )Navigator Encounter Type: Telephone;[facility] Follow-up (04/05/17 0900) Telephone: Outgoing Call;Clinic/[facility] Follow-up (04/05/17 0900)                 Treatment Initiated Date: 04/11/17 (04/05/17 0900)                                Time Spent with Patient: 15 (04/05/17 0900)

## 2017-04-05 NOTE — Telephone Encounter (Signed)
04/05/2017 @ 6:54 am faxed FMLA/Disability paperwork to [facility] @ [phone]. Spoke with Reggie @ 7:02 am letting her know paperwork was successfully faxed; she requested a copy be sent to her @ [address] [place], [place] [address]

## 2017-04-08 ENCOUNTER — Encounter (HOSPITAL_COMMUNITY): Payer: Self-pay | Admitting: Surgery

## 2017-04-09 ENCOUNTER — Ambulatory Visit (HOSPITAL_BASED_OUTPATIENT_CLINIC_OR_DEPARTMENT_OTHER): Payer: 59 | Admitting: Oncology

## 2017-04-09 ENCOUNTER — Telehealth: Payer: Self-pay | Admitting: Adult Health

## 2017-04-09 ENCOUNTER — Encounter: Payer: Self-pay | Admitting: Oncology

## 2017-04-09 ENCOUNTER — Other Ambulatory Visit: Payer: 59

## 2017-04-09 VITALS — BP 120/52 | HR 86 | Temp 98.2°F | Resp 18 | Ht 65.0 in | Wt 161.9 lb

## 2017-04-09 DIAGNOSIS — Z171 Estrogen receptor negative status [ER-]: Secondary | ICD-10-CM | POA: Diagnosis not present

## 2017-04-09 DIAGNOSIS — C50211 Malignant neoplasm of upper-inner quadrant of right female breast: Secondary | ICD-10-CM

## 2017-04-09 DIAGNOSIS — C50411 Malignant neoplasm of upper-outer quadrant of right female breast: Secondary | ICD-10-CM

## 2017-04-09 NOTE — Telephone Encounter (Signed)
Spoke to patient regarding upcoming November appointments. °

## 2017-04-09 NOTE — Progress Notes (Signed)
Met w/ pt to introduce myself as her Arboriculturist and to discuss copay assistance w/ [employer] for Neulasta.  Pt would like to apply so I faxed the signed application and activated the card today.  I also informed her of the [employer], went over what it covers and gave her the income requirement.  She stated they exceed the requirement so she wouldn't qualify for the grant.  She has my card for any questions or concerns she may have in the future.

## 2017-04-11 ENCOUNTER — Ambulatory Visit
Admission: RE | Admit: 2017-04-11 | Discharge: 2017-04-11 | Disposition: A | Discharge status: Home / Self Care | Payer: 59 | Source: Ambulatory Visit | Attending: Surgery | Admitting: Surgery

## 2017-04-11 ENCOUNTER — Other Ambulatory Visit: Payer: Self-pay | Admitting: Surgery

## 2017-04-11 ENCOUNTER — Ambulatory Visit: Payer: 59

## 2017-04-11 ENCOUNTER — Ambulatory Visit (HOSPITAL_BASED_OUTPATIENT_CLINIC_OR_DEPARTMENT_OTHER): Payer: 59

## 2017-04-11 ENCOUNTER — Encounter: Payer: Self-pay | Admitting: *Deleted

## 2017-04-11 ENCOUNTER — Other Ambulatory Visit: Payer: 59

## 2017-04-11 ENCOUNTER — Other Ambulatory Visit (HOSPITAL_BASED_OUTPATIENT_CLINIC_OR_DEPARTMENT_OTHER): Payer: 59

## 2017-04-11 VITALS — BP 116/77 | HR 87 | Temp 98.0°F | Resp 18

## 2017-04-11 DIAGNOSIS — Z171 Estrogen receptor negative status [ER-]: Principal | ICD-10-CM

## 2017-04-11 DIAGNOSIS — C50411 Malignant neoplasm of upper-outer quadrant of right female breast: Secondary | ICD-10-CM

## 2017-04-11 DIAGNOSIS — C50919 Malignant neoplasm of unspecified site of unspecified female breast: Secondary | ICD-10-CM

## 2017-04-11 DIAGNOSIS — C50311 Malignant neoplasm of lower-inner quadrant of right female breast: Secondary | ICD-10-CM | POA: Diagnosis not present

## 2017-04-11 DIAGNOSIS — C50211 Malignant neoplasm of upper-inner quadrant of right female breast: Secondary | ICD-10-CM

## 2017-04-11 DIAGNOSIS — N6489 Other specified disorders of breast: Secondary | ICD-10-CM | POA: Diagnosis not present

## 2017-04-11 DIAGNOSIS — Z5111 Encounter for antineoplastic chemotherapy: Secondary | ICD-10-CM | POA: Diagnosis not present

## 2017-04-11 DIAGNOSIS — N6022 Fibroadenosis of left breast: Secondary | ICD-10-CM | POA: Diagnosis not present

## 2017-04-11 DIAGNOSIS — N6459 Other signs and symptoms in breast: Secondary | ICD-10-CM | POA: Diagnosis not present

## 2017-04-11 DIAGNOSIS — Z95828 Presence of other vascular implants and grafts: Secondary | ICD-10-CM

## 2017-04-11 LAB — CBC WITH DIFFERENTIAL/PLATELET
BASO%: 0.2 % (ref 0.0–2.0)
Basophils Absolute: 0 10*3/uL (ref 0.0–0.1)
EOS%: 2.7 % (ref 0.0–7.0)
Eosinophils Absolute: 0.3 10*3/uL (ref 0.0–0.5)
HCT: 36 % (ref 34.8–46.6)
HEMOGLOBIN: 12.4 g/dL (ref 11.6–15.9)
LYMPH%: 24.8 % (ref 14.0–49.7)
MCH: 32.5 pg (ref 25.1–34.0)
MCHC: 34.4 g/dL (ref 31.5–36.0)
MCV: 94.5 fL (ref 79.5–101.0)
MONO#: 0.8 10*3/uL (ref 0.1–0.9)
MONO%: 7 % (ref 0.0–14.0)
NEUT%: 65.3 % (ref 38.4–76.8)
NEUTROS ABS: 7 10*3/uL — AB (ref 1.5–6.5)
Platelets: 294 10*3/uL (ref 145–400)
RBC: 3.81 10*6/uL (ref 3.70–5.45)
RDW: 12.1 % (ref 11.2–14.5)
WBC: 10.7 10*3/uL — ABNORMAL HIGH (ref 3.9–10.3)
lymph#: 2.7 10*3/uL (ref 0.9–3.3)

## 2017-04-11 LAB — COMPREHENSIVE METABOLIC PANEL
ALBUMIN: 3.7 g/dL (ref 3.5–5.0)
ALK PHOS: 57 U/L (ref 40–150)
ALT: 13 U/L (ref 0–55)
AST: 12 U/L (ref 5–34)
Anion Gap: 7 mEq/L (ref 3–11)
BILIRUBIN TOTAL: 0.26 mg/dL (ref 0.20–1.20)
BUN: 9.8 mg/dL (ref 7.0–26.0)
CO2: 22 meq/L (ref 22–29)
Calcium: 8.8 mg/dL (ref 8.4–10.4)
Chloride: 109 mEq/L (ref 98–109)
Creatinine: 0.7 mg/dL (ref 0.6–1.1)
EGFR: >60 mL/min/{1.73_m2} (ref >60)
GLUCOSE: 83 mg/dL (ref 70–140)
Potassium: 3.8 mEq/L (ref 3.5–5.1)
SODIUM: 138 meq/L (ref 136–145)
TOTAL PROTEIN: 6.8 g/dL (ref 6.4–8.3)

## 2017-04-11 MED ORDER — PALONOSETRON HCL INJECTION 0.25 MG/5ML
INTRAVENOUS | Status: AC
  Filled 2017-04-11: qty 5

## 2017-04-11 MED ORDER — SODIUM CHLORIDE 0.9 % IV SOLN
Freq: Once | INTRAVENOUS | Status: AC
  Administered 2017-04-11: 15:00:00 via INTRAVENOUS
  Filled 2017-04-11: qty 5

## 2017-04-11 MED ORDER — SODIUM CHLORIDE 0.9 % IV SOLN
Freq: Once | INTRAVENOUS | Status: AC
  Administered 2017-04-11: 15:00:00 via INTRAVENOUS

## 2017-04-11 MED ORDER — DOXORUBICIN HCL CHEMO IV INJECTION 2 MG/ML
60.0000 mg/m2 | Freq: Once | INTRAVENOUS | Status: AC
  Administered 2017-04-11: 110 mg via INTRAVENOUS
  Filled 2017-04-11: qty 55

## 2017-04-11 MED ORDER — GADOBENATE DIMEGLUMINE 529 MG/ML IV SOLN
15.0000 mL | Freq: Once | INTRAVENOUS | Status: AC | PRN
  Administered 2017-04-11: 15 mL via INTRAVENOUS

## 2017-04-11 MED ORDER — SODIUM CHLORIDE 0.9 % IV SOLN
600.0000 mg/m2 | Freq: Once | INTRAVENOUS | Status: AC
  Administered 2017-04-11: 1100 mg via INTRAVENOUS
  Filled 2017-04-11: qty 55

## 2017-04-11 MED ORDER — SODIUM CHLORIDE 0.9% FLUSH
10.0000 mL | INTRAVENOUS | Status: DC | PRN
  Filled 2017-04-11: qty 10

## 2017-04-11 MED ORDER — SODIUM CHLORIDE 0.9% FLUSH
10.0000 mL | INTRAVENOUS | Status: DC | PRN
  Administered 2017-04-11: 10 mL
  Filled 2017-04-11: qty 10

## 2017-04-11 MED ORDER — HEPARIN SOD (PORK) LOCK FLUSH 100 UNIT/ML IV SOLN
500.0000 [IU] | Freq: Once | INTRAVENOUS | Status: AC | PRN
  Administered 2017-04-11: 500 [IU]
  Filled 2017-04-11: qty 5

## 2017-04-11 MED ORDER — PALONOSETRON HCL INJECTION 0.25 MG/5ML
0.2500 mg | Freq: Once | INTRAVENOUS | Status: AC
  Administered 2017-04-11: 0.25 mg via INTRAVENOUS

## 2017-04-11 NOTE — Patient Instructions (Signed)

## 2017-04-11 NOTE — Patient Instructions (Signed)
[facility] Discharge Instructions for Patients Receiving Chemotherapy  Today you received the following chemotherapy agents: Adriamycin & Cytoxan  To help prevent nausea and vomiting after your treatment, we encourage you to take your nausea medication as prescribed by your doctor.  If you develop nausea and vomiting that is not controlled by your nausea medication, call the clinic.   BELOW ARE SYMPTOMS THAT SHOULD BE REPORTED IMMEDIATELY:  *FEVER GREATER THAN 100.5 F  *CHILLS WITH OR WITHOUT FEVER  NAUSEA AND VOMITING THAT IS NOT CONTROLLED WITH YOUR NAUSEA MEDICATION  *UNUSUAL SHORTNESS OF BREATH  *UNUSUAL BRUISING OR BLEEDING  TENDERNESS IN MOUTH AND THROAT WITH OR WITHOUT PRESENCE OF ULCERS  *URINARY PROBLEMS  *BOWEL PROBLEMS  UNUSUAL RASH Items with * indicate a potential emergency and should be followed up as soon as possible.  Feel free to call the clinic should you have any questions or concerns. The clinic phone number is [phone].  Please show the [place] at check-in to the Emergency Department and triage nurse.   Doxorubicin (Adriamycin) injection What is this medicine? DOXORUBICIN (dox oh ROO bi sin) is a chemotherapy drug. It is used to treat many kinds of cancer like leukemia, lymphoma, neuroblastoma, sarcoma, and Wilms' tumor. It is also used to treat bladder cancer, breast cancer, lung cancer, ovarian cancer, stomach cancer, and thyroid cancer. This medicine may be used for other purposes; ask your health care provider or pharmacist if you have questions. COMMON BRAND NAME(S): Adriamycin, Adriamycin PFS, Adriamycin RDF, Rubex What should I tell my health care provider before I take this medicine? They need to know if you have any of these conditions: -heart disease -history of low blood counts caused by a medicine -liver disease -recent or ongoing radiation therapy -an unusual or allergic reaction to doxorubicin, other  chemotherapy agents, other medicines, foods, dyes, or preservatives -pregnant or trying to get pregnant -breast-feeding How should I use this medicine? This drug is given as an infusion into a vein. It is administered in a hospital or clinic by a specially trained health care professional. If you have pain, swelling, burning or any unusual feeling around the site of your injection, tell your health care professional right away. Talk to your pediatrician regarding the use of this medicine in children. Special care may be needed. Overdosage: If you think you have taken too much of this medicine contact a poison control center or emergency room at once. NOTE: This medicine is only for you. Do not share this medicine with others. What if I miss a dose? It is important not to miss your dose. Call your doctor or health care professional if you are unable to keep an appointment. What may interact with this medicine? This medicine may interact with the following medications: -6-mercaptopurine -paclitaxel -phenytoin -St. John's Wort -trastuzumab -verapamil This list may not describe all possible interactions. Give your health care provider a list of all the medicines, herbs, non-prescription drugs, or dietary supplements you use. Also tell them if you smoke, drink alcohol, or use illegal drugs. Some items may interact with your medicine. What should I watch for while using this medicine? This drug may make you feel generally unwell. This is not uncommon, as chemotherapy can affect healthy cells as well as cancer cells. Report any side effects. Continue your course of treatment even though you feel ill unless your doctor tells you to stop. There is a maximum amount of this medicine you should receive throughout your life.  The amount depends on the medical condition being treated and your overall health. Your doctor will watch how much of this medicine you receive in your lifetime. Tell your doctor if you  have taken this medicine before. You may need blood work done while you are taking this medicine. Your urine may turn red for a few days after your dose. This is not blood. If your urine is dark or brown, call your doctor. In some cases, you may be given additional medicines to help with side effects. Follow all directions for their use. Call your doctor or health care professional for advice if you get a fever, chills or sore throat, or other symptoms of a cold or flu. Do not treat yourself. This drug decreases your body's ability to fight infections. Try to avoid being around people who are sick. This medicine may increase your risk to bruise or bleed. Call your doctor or health care professional if you notice any unusual bleeding. Talk to your doctor about your risk of cancer. You may be more at risk for certain types of cancers if you take this medicine. Do not become pregnant while taking this medicine or for 6 months after stopping it. Women should inform their doctor if they wish to become pregnant or think they might be pregnant. Men should not father a child while taking this medicine and for 6 months after stopping it. There is a potential for serious side effects to an unborn child. Talk to your health care professional or pharmacist for more information. Do not breast-feed an infant while taking this medicine. This medicine has caused ovarian failure in some women and reduced sperm counts in some men This medicine may interfere with the ability to have a child. Talk with your doctor or health care professional if you are concerned about your fertility. What side effects may I notice from receiving this medicine? Side effects that you should report to your doctor or health care professional as soon as possible: -allergic reactions like skin rash, itching or hives, swelling of the face, lips, or tongue -breathing problems -chest pain -fast or irregular heartbeat -low blood counts - this  medicine may decrease the number of white blood cells, red blood cells and platelets. You may be at increased risk for infections and bleeding. -pain, redness, or irritation at site where injected -signs of infection - fever or chills, cough, sore throat, pain or difficulty passing urine -signs of decreased platelets or bleeding - bruising, pinpoint red spots on the skin, black, tarry stools, blood in the urine -swelling of the ankles, feet, hands -tiredness -weakness Side effects that usually do not require medical attention (report to your doctor or health care professional if they continue or are bothersome): -diarrhea -hair loss -mouth sores -nail discoloration or damage -nausea -red colored urine -vomiting This list may not describe all possible side effects. Call your doctor for medical advice about side effects. You may report side effects to FDA at 1-800-FDA-1088. Where should I keep my medicine? This drug is given in a hospital or clinic and will not be stored at home. NOTE: This sheet is a summary. It may not cover all possible information. If you have questions about this medicine, talk to your doctor, pharmacist, or health care provider.  2018 Elsevier/Gold Standard (2015-07-11 11:28:51)   Cyclophosphamide (Cytoxan) injection What is this medicine? CYCLOPHOSPHAMIDE (sye kloe FOSS fa mide) is a chemotherapy drug. It slows the growth of cancer cells. This medicine is used to treat  many types of cancer like lymphoma, myeloma, leukemia, breast cancer, and ovarian cancer, to name a few. This medicine may be used for other purposes; ask your health care provider or pharmacist if you have questions. COMMON BRAND NAME(S): Cytoxan, Neosar What should I tell my health care provider before I take this medicine? They need to know if you have any of these conditions: -blood disorders -history of other chemotherapy -infection -kidney disease -liver disease -recent or ongoing  radiation therapy -tumors in the bone marrow -an unusual or allergic reaction to cyclophosphamide, other chemotherapy, other medicines, foods, dyes, or preservatives -pregnant or trying to get pregnant -breast-feeding How should I use this medicine? This drug is usually given as an injection into a vein or muscle or by infusion into a vein. It is administered in a hospital or clinic by a specially trained health care professional. Talk to your pediatrician regarding the use of this medicine in children. Special care may be needed. Overdosage: If you think you have taken too much of this medicine contact a poison control center or emergency room at once. NOTE: This medicine is only for you. Do not share this medicine with others. What if I miss a dose? It is important not to miss your dose. Call your doctor or health care professional if you are unable to keep an appointment. What may interact with this medicine? This medicine may interact with the following medications: -amiodarone -amphotericin B -azathioprine -certain antiviral medicines for HIV or AIDS such as protease inhibitors (e.g., indinavir, ritonavir) and zidovudine -certain blood pressure medications such as benazepril, captopril, enalapril, fosinopril, lisinopril, moexipril, monopril, perindopril, quinapril, ramipril, trandolapril -certain cancer medications such as anthracyclines (e.g., daunorubicin, doxorubicin), busulfan, cytarabine, paclitaxel, pentostatin, tamoxifen, trastuzumab -certain diuretics such as chlorothiazide, chlorthalidone, hydrochlorothiazide, indapamide, metolazone -certain medicines that treat or prevent blood clots like warfarin -certain muscle relaxants such as succinylcholine -cyclosporine -etanercept -indomethacin -medicines to increase blood counts like filgrastim, pegfilgrastim, sargramostim -medicines used as general anesthesia -metronidazole -natalizumab This list may not describe all possible  interactions. Give your health care provider a list of all the medicines, herbs, non-prescription drugs, or dietary supplements you use. Also tell them if you smoke, drink alcohol, or use illegal drugs. Some items may interact with your medicine. What should I watch for while using this medicine? Visit your doctor for checks on your progress. This drug may make you feel generally unwell. This is not uncommon, as chemotherapy can affect healthy cells as well as cancer cells. Report any side effects. Continue your course of treatment even though you feel ill unless your doctor tells you to stop. Drink water or other fluids as directed. Urinate often, even at night. In some cases, you may be given additional medicines to help with side effects. Follow all directions for their use. Call your doctor or health care professional for advice if you get a fever, chills or sore throat, or other symptoms of a cold or flu. Do not treat yourself. This drug decreases your body's ability to fight infections. Try to avoid being around people who are sick. This medicine may increase your risk to bruise or bleed. Call your doctor or health care professional if you notice any unusual bleeding. Be careful brushing and flossing your teeth or using a toothpick because you may get an infection or bleed more easily. If you have any dental work done, tell your dentist you are receiving this medicine. You may get drowsy or dizzy. Do not drive, use machinery, or  do anything that needs mental alertness until you know how this medicine affects you. Do not become pregnant while taking this medicine or for 1 year after stopping it. Women should inform their doctor if they wish to become pregnant or think they might be pregnant. Men should not father a child while taking this medicine and for 4 months after stopping it. There is a potential for serious side effects to an unborn child. Talk to your health care professional or pharmacist for  more information. Do not breast-feed an infant while taking this medicine. This medicine may interfere with the ability to have a child. This medicine has caused ovarian failure in some women. This medicine has caused reduced sperm counts in some men. You should talk with your doctor or health care professional if you are concerned about your fertility. If you are going to have surgery, tell your doctor or health care professional that you have taken this medicine. What side effects may I notice from receiving this medicine? Side effects that you should report to your doctor or health care professional as soon as possible: -allergic reactions like skin rash, itching or hives, swelling of the face, lips, or tongue -low blood counts - this medicine may decrease the number of white blood cells, red blood cells and platelets. You may be at increased risk for infections and bleeding. -signs of infection - fever or chills, cough, sore throat, pain or difficulty passing urine -signs of decreased platelets or bleeding - bruising, pinpoint red spots on the skin, black, tarry stools, blood in the urine -signs of decreased red blood cells - unusually weak or tired, fainting spells, lightheadedness -breathing problems -dark urine -dizziness -palpitations -swelling of the ankles, feet, hands -trouble passing urine or change in the amount of urine -weight gain -yellowing of the eyes or skin Side effects that usually do not require medical attention (report to your doctor or health care professional if they continue or are bothersome): -changes in nail or skin color -hair loss -missed menstrual periods -mouth sores -nausea, vomiting This list may not describe all possible side effects. Call your doctor for medical advice about side effects. You may report side effects to FDA at 1-800-FDA-1088. Where should I keep my medicine? This drug is given in a hospital or clinic and will not be stored at  home. NOTE: This sheet is a summary. It may not cover all possible information. If you have questions about this medicine, talk to your doctor, pharmacist, or health care provider.  2018 Elsevier/Gold Standard (2012-03-28 16:22:58)

## 2017-04-12 ENCOUNTER — Ambulatory Visit: Payer: 59

## 2017-04-12 ENCOUNTER — Other Ambulatory Visit: Payer: 59

## 2017-04-12 ENCOUNTER — Encounter: Payer: Self-pay | Admitting: *Deleted

## 2017-04-13 ENCOUNTER — Ambulatory Visit (HOSPITAL_BASED_OUTPATIENT_CLINIC_OR_DEPARTMENT_OTHER): Payer: 59

## 2017-04-13 VITALS — BP 113/76 | HR 81 | Temp 98.3°F | Resp 17

## 2017-04-13 DIAGNOSIS — C50411 Malignant neoplasm of upper-outer quadrant of right female breast: Secondary | ICD-10-CM

## 2017-04-13 DIAGNOSIS — Z5189 Encounter for other specified aftercare: Secondary | ICD-10-CM | POA: Diagnosis not present

## 2017-04-13 DIAGNOSIS — C50211 Malignant neoplasm of upper-inner quadrant of right female breast: Secondary | ICD-10-CM | POA: Diagnosis not present

## 2017-04-13 DIAGNOSIS — Z171 Estrogen receptor negative status [ER-]: Principal | ICD-10-CM

## 2017-04-13 MED ORDER — PEGFILGRASTIM INJECTION 6 MG/0.6ML ~~LOC~~
6.0000 mg | PREFILLED_SYRINGE | Freq: Once | SUBCUTANEOUS | Status: AC
  Administered 2017-04-13: 6 mg via SUBCUTANEOUS

## 2017-04-13 NOTE — Patient Instructions (Signed)
Pegfilgrastim injection (neulasta) What is this medicine? PEGFILGRASTIM (PEG fil gra stim) is a long-acting granulocyte colony-stimulating factor that stimulates the growth of neutrophils, a type of white blood cell important in the body's fight against infection. It is used to reduce the incidence of fever and infection in patients with certain types of cancer who are receiving chemotherapy that affects the bone marrow, and to increase survival after being exposed to high doses of radiation. This medicine may be used for other purposes; ask your health care provider or pharmacist if you have questions. COMMON BRAND NAME(S): Neulasta What should I tell my health care provider before I take this medicine? They need to know if you have any of these conditions: -kidney disease -latex allergy -ongoing radiation therapy -sickle cell disease -skin reactions to acrylic adhesives (On-Body Injector only) -an unusual or allergic reaction to pegfilgrastim, filgrastim, other medicines, foods, dyes, or preservatives -pregnant or trying to get pregnant -breast-feeding How should I use this medicine? This medicine is for injection under the skin. If you get this medicine at home, you will be taught how to prepare and give the pre-filled syringe or how to use the On-body Injector. Refer to the patient Instructions for Use for detailed instructions. Use exactly as directed. Tell your healthcare provider immediately if you suspect that the On-body Injector may not have performed as intended or if you suspect the use of the On-body Injector resulted in a missed or partial dose. It is important that you put your used needles and syringes in a special sharps container. Do not put them in a trash can. If you do not have a sharps container, call your pharmacist or healthcare provider to get one. Talk to your pediatrician regarding the use of this medicine in children. While this drug may be prescribed for selected  conditions, precautions do apply. Overdosage: If you think you have taken too much of this medicine contact a poison control center or emergency room at once. NOTE: This medicine is only for you. Do not share this medicine with others. What if I miss a dose? It is important not to miss your dose. Call your doctor or health care professional if you miss your dose. If you miss a dose due to an On-body Injector failure or leakage, a new dose should be administered as soon as possible using a single prefilled syringe for manual use. What may interact with this medicine? Interactions have not been studied. Give your health care provider a list of all the medicines, herbs, non-prescription drugs, or dietary supplements you use. Also tell them if you smoke, drink alcohol, or use illegal drugs. Some items may interact with your medicine. This list may not describe all possible interactions. Give your health care provider a list of all the medicines, herbs, non-prescription drugs, or dietary supplements you use. Also tell them if you smoke, drink alcohol, or use illegal drugs. Some items may interact with your medicine. What should I watch for while using this medicine? You may need blood work done while you are taking this medicine. If you are going to need a MRI, CT scan, or other procedure, tell your doctor that you are using this medicine (On-Body Injector only). What side effects may I notice from receiving this medicine? Side effects that you should report to your doctor or health care professional as soon as possible: -allergic reactions like skin rash, itching or hives, swelling of the face, lips, or tongue -dizziness -fever -pain, redness, or irritation at  site where injected -pinpoint red spots on the skin -red or dark-brown urine -shortness of breath or breathing problems -stomach or side pain, or pain at the shoulder -swelling -tiredness -trouble passing urine or change in the amount of  urine Side effects that usually do not require medical attention (report to your doctor or health care professional if they continue or are bothersome): -bone pain -muscle pain This list may not describe all possible side effects. Call your doctor for medical advice about side effects. You may report side effects to FDA at 1-800-FDA-1088. Where should I keep my medicine? Keep out of the reach of children. Store pre-filled syringes in a refrigerator between 2 and 8 degrees C (36 and 46 degrees F). Do not freeze. Keep in carton to protect from light. Throw away this medicine if it is left out of the refrigerator for more than 48 hours. Throw away any unused medicine after the expiration date. NOTE: This sheet is a summary. It may not cover all possible information. If you have questions about this medicine, talk to your doctor, pharmacist, or health care provider.  2018 Elsevier/Gold Standard (2016-05-10 12:58:03)  

## 2017-04-16 ENCOUNTER — Telehealth: Payer: Self-pay | Admitting: *Deleted

## 2017-04-16 NOTE — Telephone Encounter (Signed)
Per chemo follow up call - Jennifer Beck states she overall did well - with no nausea or vomiting.  She states onset of " like heartburn and the after hours nurses said to take the compazine and maalox "  This RN discussed above symptom likely due to the decadron.  Recommended for her to start Pepcid 20 mg bid x 1 week and then may decrease to 1 tab a day.  Jennifer Beck verbalized understanding- as well as above can be discussed further at appointment scheduled tomorrow.

## 2017-04-16 NOTE — Telephone Encounter (Signed)
-----   Message from Arman Bogus, RN sent at 04/11/2017  5:43 PM EST ----- Regarding: Follow up call for first timer Please call to follow up with pt.  First time Jennifer Beck & Jennifer Beck, no complications, pt of Dr. Virgie Dad Thanks Wells Guiles

## 2017-04-17 ENCOUNTER — Ambulatory Visit (HOSPITAL_BASED_OUTPATIENT_CLINIC_OR_DEPARTMENT_OTHER): Payer: 59 | Admitting: Adult Health

## 2017-04-17 ENCOUNTER — Other Ambulatory Visit: Payer: 59

## 2017-04-17 ENCOUNTER — Encounter: Payer: Self-pay | Admitting: Adult Health

## 2017-04-17 ENCOUNTER — Ambulatory Visit (HOSPITAL_BASED_OUTPATIENT_CLINIC_OR_DEPARTMENT_OTHER): Payer: 59

## 2017-04-17 ENCOUNTER — Other Ambulatory Visit (HOSPITAL_BASED_OUTPATIENT_CLINIC_OR_DEPARTMENT_OTHER): Payer: 59

## 2017-04-17 VITALS — BP 117/76 | HR 94 | Temp 98.4°F | Resp 20 | Ht 65.0 in | Wt 161.0 lb

## 2017-04-17 DIAGNOSIS — Z171 Estrogen receptor negative status [ER-]: Principal | ICD-10-CM

## 2017-04-17 DIAGNOSIS — C50411 Malignant neoplasm of upper-outer quadrant of right female breast: Secondary | ICD-10-CM

## 2017-04-17 DIAGNOSIS — Z95828 Presence of other vascular implants and grafts: Secondary | ICD-10-CM

## 2017-04-17 LAB — COMPREHENSIVE METABOLIC PANEL
ALBUMIN: 3.5 g/dL (ref 3.5–5.0)
ALK PHOS: 90 U/L (ref 40–150)
ALT: 12 U/L (ref 0–55)
AST: 10 U/L (ref 5–34)
Anion Gap: 6 mEq/L (ref 3–11)
BUN: 12.2 mg/dL (ref 7.0–26.0)
CALCIUM: 8.5 mg/dL (ref 8.4–10.4)
CO2: 24 mEq/L (ref 22–29)
Chloride: 107 mEq/L (ref 98–109)
Creatinine: 0.6 mg/dL (ref 0.6–1.1)
Glucose: 84 mg/dl (ref 70–140)
POTASSIUM: 4.2 meq/L (ref 3.5–5.1)
Sodium: 136 mEq/L (ref 136–145)
Total Bilirubin: 0.38 mg/dL (ref 0.20–1.20)
Total Protein: 6.4 g/dL (ref 6.4–8.3)

## 2017-04-17 LAB — CBC WITH DIFFERENTIAL/PLATELET
BASO%: 0.5 % (ref 0.0–2.0)
BASOS ABS: 0 10*3/uL (ref 0.0–0.1)
EOS%: 9.7 % — ABNORMAL HIGH (ref 0.0–7.0)
Eosinophils Absolute: 0.4 10*3/uL (ref 0.0–0.5)
HEMATOCRIT: 36.5 % (ref 34.8–46.6)
HEMOGLOBIN: 12.4 g/dL (ref 11.6–15.9)
LYMPH#: 0.9 10*3/uL (ref 0.9–3.3)
LYMPH%: 19.9 % (ref 14.0–49.7)
MCH: 32.4 pg (ref 25.1–34.0)
MCHC: 34 g/dL (ref 31.5–36.0)
MCV: 95.3 fL (ref 79.5–101.0)
MONO#: 0.1 10*3/uL (ref 0.1–0.9)
MONO%: 2.5 % (ref 0.0–14.0)
NEUT#: 3 10*3/uL (ref 1.5–6.5)
NEUT%: 67.4 % (ref 38.4–76.8)
Platelets: 208 10*3/uL (ref 145–400)
RBC: 3.83 10*6/uL (ref 3.70–5.45)
RDW: 11.9 % (ref 11.2–14.5)
WBC: 4.4 10*3/uL (ref 3.9–10.3)

## 2017-04-17 MED ORDER — SODIUM CHLORIDE 0.9% FLUSH
10.0000 mL | INTRAVENOUS | Status: DC | PRN
  Administered 2017-04-17: 10 mL via INTRAVENOUS
  Filled 2017-04-17: qty 10

## 2017-04-17 MED ORDER — HEPARIN SOD (PORK) LOCK FLUSH 100 UNIT/ML IV SOLN
500.0000 [IU] | Freq: Once | INTRAVENOUS | Status: AC
  Administered 2017-04-17: 500 [IU] via INTRAVENOUS
  Filled 2017-04-17: qty 5

## 2017-04-17 NOTE — Progress Notes (Signed)
[place]  [phone] [phone]     ID: Jennifer Beck DOB: 07/04/1965  MR#: [id number]  LXB#:[id number]  Patient Care Team: Jennifer Shirts, MD as PCP - General (Internal Medicine) Jennifer Sine, MD as Consulting Physician (Dermatology) Jennifer Beck, DC as Referring Physician (Chiropractic Medicine) Jennifer Overall, MD as Consulting Physician (General Surgery) Jennifer Gibson, MD as Attending Physician (Radiation Oncology) Jennifer Fus, MD as Consulting Physician (Obstetrics and Gynecology) OTHER MD:  CHIEF COMPLAINT: Triple negative breast cancer  CURRENT TREATMENT: Neoadjuvant chemotherapy   HISTORY OF CURRENT ILLNESS:  From the original intake note:  Jennifer Beck palpated a mass in her right breast sometime in September 2018.[name] She brought this to medical attention and her PCP, Dr. Bing Beck, set her up on 03/18/2017 for a unilateral right diagnostic mammography with ultrasonography, showing: Breast density D. The palpable right breast mass at 12:30 was indeterminate, and biopsy was warranted at that time. No evidence of right axillary lymphadenopathy was noted. Biopsy of the lesion in question on 03/21/2017 at the right breast 12:30 position showed ([id number])  invasive ductal carcinoma with lymphovascular invasion.  The tumor was grade 3, triple negative, with an MIB-1 of 80%.  She is accompanied by her husband, Jennifer Beck to the office today. She reports that she is doing well Beck. She initially felt a lump in September in the shower and notes that she doesn't complete monthly self breast exams. She had a routine mammogram in March 2018 at [facility] and she is followed by Jennifer Beck.  As far as surgeries, she has had two laparoscopic procedures for endometriosis as well as a bilateral tubal ligation. She still has occasional cramping, painful ovulation, and vaginal spotting that comes and goes. She has had cyst  to her breast before that have been evaluated and ruled as negative. She notes that she still has her tonsils, gallbladder, and appendix. She has a prior hx of pericarditis in 2003 that she notes was brought onby a virus. She has since been cleared by her prior Cardiologist.   She has a Restaurant manager, fast food, Jennifer Beck that she sees every 5 weeks for maintenance of her neck and back issues. She has a dermatologist, Jennifer Beck at [facility] and she has had an biopsy of an area from her left chest that resulted as basal cell carcinoma in 2015. She denies having a GI specialist, Cardiologist, or Orthopedist at this time. She denies prior history of seizures, migraines, acid reflux, asthma, emphysema, palpitations or GI issues.    [name] is as detailed below.  INTERVAL HISTORY: Jennifer Beck returns today for follow-up and treatment of her triple negative breast cancer, accompanied by her husband Jennifer Beck.   She is here for evaluation after receiving her first dose of neo adjuvant chemotherapy.  She is undergoing Doxorubicin and Cyclophosphamide given on day 1 of a 14 day cycle with Neulasta support.  [name] tolerated chemotherapy well.   REVIEW OF SYSTEMS:  Jennifer Beck is here with her daughter after receiving her first cycle of chemotherapy.  She tolerated it well.  She took the anti emetics as prescribed and did not experience any nausea.  She had mild bilateral shoulder pain, but otherwise had no issues with the neulasta.  She had some reflux and indigestion pain, however has started on Pepcid bid x 1 week, then will go to daily.  This is starting to help.  She is not neutropenic today.  She is mildly fatigued.  She had some constipation following chemotherapy, however today she feels back to normal.   She is going on Tuesday to cut her hair.  She denies fevers, chills, or any other concerns today and a detailed ROS is otherwise non contributory.    PAST MEDICAL  HISTORY: Past Medical History:  Diagnosis Date  . Chest pain   . Complication of anesthesia   . Endometriosis   . Fatigue   . GERD (gastroesophageal reflux disease)   . GI bleed    caused by ischemic colitis following an episode of hypertension  . Heart palpitations   . Hx of echocardiogram    a. echo 9/13:  EF 55-60%, Gr 2 diast dysfn  . Hypercholesterolemia   . Ischemic colitis ([place])   . 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 Myocarditis ([place])   . Pericarditis   . PONV (postoperative nausea and vomiting)   . SOB (shortness of breath)     PAST SURGICAL HISTORY: Past Surgical History:  Procedure Laterality Date  . CARDIAC CATHETERIZATION  08/26/2001   EF of 60% --- smooth & normal coronary arteries -- there is a Dehner motion defect consistent with posterolateral MI -- she quite possibly had a coronary spasm -- she may have some pericarditis secondary to her MI   . DILATION AND CURETTAGE OF UTERUS  10/15/2003   Uterine enlargement, menorrhagia  . DILATION AND CURETTAGE OF UTERUS  05/27/2002   Dysfunctional uterine bleeding  . LAPAROSCOPY    . NOVASURE ABLATION  10/15/2003   for management of her extreme menorrhagi  . PORTACATH PLACEMENT Right 04/02/2017   Procedure: INSERTION PORT-A-CATH;  Surgeon: Jennifer Overall, MD;  Location: [facility];  Service: General;  Laterality: Right;  . TUBAL LIGATION  09/2000   bilateral    FAMILY HISTORY Family History  Problem Relation Age of Onset  . Hypertension Father   . Diabetes Father   . Coronary artery disease Mother   . Kidney cancer Mother   . Coronary artery disease Maternal Grandfather   . Colon cancer Maternal Grandfather   . Coronary artery disease Maternal Uncle   . Coronary artery disease Maternal Uncle   . Colon cancer Maternal Grandmother   . Breast cancer Cousin   . Cervical cancer Maternal Aunt    Her father died last 02-29-2016 at age 65 just 24 week shy of  his 83rd birthday. Her mother is 84 years old as of October 2018. Patient has one brother and no sisters. Her mother was diagnosed with kidney cancer at age 1 with a nephrectomy following. Her maternal grandmother was diagnosed with colon cancer at age 49.  The patient paternal grandfather was diagnosed with colon cancer in his 33's. She has paternal cousins through her fathers half-sisters that have been diagnosed with breast cancer, she is unsure of the number of breast cancer occurrences due to the distance . A maternal cousin was diagnosed with breast cancer at age 13 and she had genetic testing. The patient does have a copy of this testing as well as her PCP.  This was not available for review on the nasal  GYNECOLOGIC HISTORY:  No LMP recorded. Patient has had an ablation.   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 Her husband Jennifer Beck is a Airline pilot. Her daughter [name] is 66 and currently a sophomore at [employer]. 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  The patient attends [employer]. She lives in [place].      ADVANCED DIRECTIVES:    HEALTH MAINTENANCE: Social History   Tobacco Use  . Smoking status: Never Smoker  . Smokeless tobacco: Never Used  Substance Use Topics  . Alcohol use: Yes    Comment: Occasionally drinks wine  . Drug use: No     Colonoscopy: Yes, 2003  PAP: March 2018   Bone density: N/A   Allergies  Allergen Reactions  . Codeine Itching  . Biaxin [Clarithromycin]     Heart Burn    Current Outpatient Medications  Medication Sig Dispense Refill  . acetaminophen (TYLENOL) 325 MG tablet Take 650 mg by mouth every 6 (six) hours as needed.    . ALPRAZolam  (XANAX) 0.25 MG tablet Take 0.25 mg by mouth daily as needed for anxiety.    . Cholecalciferol (VITAMIN D) 2000 units CAPS Take 2,000 Units by mouth daily.    [name] dexamethasone (DECADRON) 4 MG tablet Take 2 tablets by mouth once a day on the day after chemotherapy and then take 2 tablets two times a day for 2 days. Take with food. 30 tablet 1  . Evening Primrose Oil 1000 MG CAPS Take 1 capsule by mouth every evening.     . famotidine (PEPCID) 10 MG tablet Take 10 mg by mouth 2 (two) times daily.    . fluocinonide cream (LIDEX) 2.11 % Apply 1 application topically 2 (two) times daily as needed.     [name] glucosamine-chondroitin 500-400 MG tablet Take 1 tablet by mouth daily.     [name] HYDROcodone-acetaminophen (NORCO/VICODIN) 5-325 MG tablet Take 1-2 tablets every 6 (six) hours as needed by mouth for moderate pain. 15 tablet 0  . ibuprofen (ADVIL,MOTRIN) 200 MG tablet Take 400 mg by mouth every 8 (eight) hours as needed for headache or mild pain.     [name] lidocaine-prilocaine (EMLA) cream Apply to affected area once 30 g 3  . LORazepam (ATIVAN) 0.5 MG tablet Take 1 tablet (0.5 mg total) by mouth at bedtime as needed (Nausea or vomiting). 30 tablet 0  . Melatonin 5 MG TABS Take 5 mg by mouth at bedtime as needed (sleep).     . Multiple Minerals-Vitamins (CALCIUM-MAGNESIUM-ZINC) TABS Take 1 tablet by mouth daily.     . Multiple Vitamins-Minerals (MULTIVITAMINS THER. W/MINERALS) TABS Take 1 tablet by mouth daily.     . Omega-3 Fatty Acids (FISH OIL) 1000 MG CAPS Take 1,000 mg by mouth 2 (two) times a week.     Vladimir Faster Glycol-Propyl Glycol (SYSTANE OP) Apply 1 drop to eye 2 (two) times daily.    . prochlorperazine (COMPAZINE) 10 MG tablet Take 1 tablet (10 mg total) by mouth every 6 (six) hours as needed (Nausea or vomiting). 30 tablet 1  . VITAMIN E PO Take 1 tablet by mouth daily.      No current facility-administered medications for this visit.     OBJECTIVE:  Vitals:   04/17/17 1001  BP: 117/76    Pulse: 94  Resp: 20  Temp: 98.4 F (36.9 C)  SpO2: 100%     Body mass index is 26.79 kg/m.   Wt Readings from Last 3 Encounters:  04/17/17 161 lb (73 kg)  04/09/17 161 lb 14.4 oz (73.4 kg)  04/02/17 160 lb 3.2 oz (72.7 kg)      ECOG FS:1  GENERAL: Patient is a well appearing female in no acute distress HEENT:  Sclerae anicteric.  Oropharynx clear and moist. No ulcerations or evidence of oropharyngeal candidiasis. Neck is supple.  NODES:  No cervical, supraclavicular, or axillary lymphadenopathy palpated.  BREAST EXAM:  Deferred. LUNGS:  Clear to auscultation bilaterally.  No wheezes or rhonchi. HEART:  Regular rate and rhythm. No murmur appreciated. ABDOMEN:  Soft, nontender.  Positive, normoactive bowel sounds. No organomegaly palpated. MSK:  No focal spinal tenderness to palpation. Full range of motion bilaterally in the upper extremities. EXTREMITIES:  No peripheral edema.   SKIN:  Clear with no obvious rashes or skin changes. No nail dyscrasia. NEURO:  Nonfocal. Well oriented.  Appropriate affect.    LAB RESULTS:  CMP     Component Value Date/Time   NA 136 04/17/2017 0922   K 4.2 04/17/2017 0922   CL 106 02/05/2012 1742   CO2 24 04/17/2017 0922   GLUCOSE 84 04/17/2017 0922   BUN 12.2 04/17/2017 0922   CREATININE 0.6 04/17/2017 0922   CALCIUM 8.5 04/17/2017 0922   PROT 6.4 04/17/2017 0922   ALBUMIN 3.5 04/17/2017 0922   AST 10 04/17/2017 0922   ALT 12 04/17/2017 0922   ALKPHOS 90 04/17/2017 0922   BILITOT 0.38 04/17/2017 0922    No results found for: TOTALPROTELP, ALBUMINELP, A1GS, A2GS, BETS, BETA2SER, GAMS, MSPIKE, SPEI  No results found for: KPAFRELGTCHN, LAMBDASER, [facility]  Lab Results  Component Value Date   WBC 4.4 04/17/2017   NEUTROABS 3.0 04/17/2017   HGB 12.4 04/17/2017   HCT 36.5 04/17/2017   MCV 95.3 04/17/2017   PLT 208 04/17/2017    '@LASTCHEMISTRY' @  No results found for: LABCA2  No components found for: CLEXNT700  No  results for input(s): INR in the last 168 hours.  No results found for: LABCA2  No results found for: FVC944  No results found for: HQP591  No results found for: MBW466  No results found for: CA2729  No components found for: HGQUANT  No results found for: CEA1 / No results found for: CEA1   No results found for: AFPTUMOR  No results found for: Tice  No results found for: PSA1  Appointment on 04/17/2017  Component Date Value Ref Range Status  . WBC 04/17/2017 4.4  3.9 - 10.3 10e3/uL Final  . NEUT# 04/17/2017 3.0  1.5 - 6.5 10e3/uL Final  . HGB 04/17/2017 12.4  11.6 - 15.9 g/dL Final  . HCT 04/17/2017 36.5  34.8 - 46.6 % Final  . Platelets 04/17/2017 208  145 - 400 10e3/uL Final  . MCV 04/17/2017 95.3  79.5 - 101.0 fL Final  . MCH 04/17/2017 32.4  25.1 - 34.0 pg Final  . MCHC 04/17/2017 34.0  31.5 - 36.0 g/dL Final  . RBC 04/17/2017 3.83  3.70 - 5.45 10e6/uL Final  . RDW 04/17/2017 11.9  11.2 - 14.5 % Final  . lymph# 04/17/2017 0.9  0.9 - 3.3 10e3/uL Final  . MONO# 04/17/2017 0.1  0.1 - 0.9 10e3/uL Final  . Eosinophils Absolute 04/17/2017 0.4  0.0 - 0.5 10e3/uL Final  . Basophils Absolute 04/17/2017 0.0  0.0 - 0.1 10e3/uL Final  . NEUT% 04/17/2017 67.4  38.4 - 76.8 % Final  . LYMPH% 04/17/2017 19.9  14.0 - 49.7 % Final  . MONO% 04/17/2017 2.5  0.0 - 14.0 % Final  . EOS% 04/17/2017 9.7* 0.0 - 7.0 % Final  . BASO% 04/17/2017 0.5  0.0 - 2.0 % Final  . Sodium 04/17/2017 136  136 - 145 mEq/L Final  . Potassium 04/17/2017 4.2  3.5 - 5.1 mEq/L Final  . Chloride 04/17/2017 107  98 - 109 mEq/L Final  . CO2 04/17/2017 24  22 - 29 mEq/L Final  . Glucose 04/17/2017 84  70 - 140 mg/dl Final   Glucose reference range is for nonfasting patients. Fasting glucose reference range is 70- 100.  [name] BUN 04/17/2017 12.2  7.0 - 26.0 mg/dL Final  . Creatinine 04/17/2017 0.6  0.6 - 1.1 mg/dL Final  . Total Bilirubin 04/17/2017 0.38  0.20 - 1.20 mg/dL Final  . Alkaline Phosphatase  04/17/2017 90  40 - 150 U/L Final  . AST 04/17/2017 10  5 - 34 U/L Final  . ALT 04/17/2017 12  0 - 55 U/L Final  . Total Protein 04/17/2017 6.4  6.4 - 8.3 g/dL Final  . Albumin 04/17/2017 3.5  3.5 - 5.0 g/dL Final  . Calcium 04/17/2017 8.5  8.4 - 10.4 mg/dL Final  . Anion Gap 04/17/2017 6  3 - 11 mEq/L Final  . EGFR 04/17/2017 >60  >60 ml/min/1.73 m2 Final   eGFR is calculated using the CKD-EPI Creatinine Equation (2009)    (this displays the last labs from the last 3 days)  No results found for: TOTALPROTELP, ALBUMINELP, A1GS, A2GS, BETS, BETA2SER, GAMS, MSPIKE, SPEI (this displays SPEP labs)  No results found for: KPAFRELGTCHN, LAMBDASER, KAPLAMBRATIO (kappa/lambda light chains)  No results found for: HGBA, HGBA2QUANT, HGBFQUANT, HGBSQUAN (Hemoglobinopathy evaluation)   No results found for: LDH  No results found for: IRON, TIBC, IRONPCTSAT (Iron and TIBC)  No results found for: FERRITIN  Urinalysis No results found for: COLORURINE, APPEARANCEUR, LABSPEC, PHURINE, GLUCOSEU, HGBUR, BILIRUBINUR, KETONESUR, PROTEINUR, UROBILINOGEN, NITRITE, LEUKOCYTESUR   STUDIES: Mr Breast Bilateral W Wo Contrast  Result Date: 03/29/2017 CLINICAL DATA:  51 year old with recent biopsy-proven triple negative invasive ductal carcinoma with lymphovascular invasion involving the upper inner quadrant of the right breast, presenting for evaluation prior to neoadjuvant chemotherapy. LABS:  Not applicable. EXAM: BILATERAL BREAST MRI WITH AND WITHOUT CONTRAST TECHNIQUE: Multiplanar, multisequence MR images of both breasts were obtained prior to and following the intravenous administration of 15 ml of MultiHance. THREE-DIMENSIONAL MR IMAGE RENDERING ON INDEPENDENT WORKSTATION: Three-dimensional MR images were rendered by post-processing of the original MR data on an independent workstation. The three-dimensional MR images were interpreted, and findings are reported in the following complete MRI report for  this study. Three dimensional images were evaluated at the independent DynaCad workstation. COMPARISON:  No prior MRI. Mammography 03/21/2017 (right), 03/18/2017 (right), 08/15/2016 (bilateral) and earlier. Right breast ultrasound 03/21/2017, 03/18/2017. FINDINGS: All measurements will be given as:  AP X WIDTH X CRANIOCAUDAL. Breast composition: d. Extreme fibroglandular tissue. Background parenchymal enhancement: Extensive. Right breast: Enhancing mass involving the upper inner quadrant measuring approximately 1.8 x 1.7 x 2.5 cm, within which is blooming artifact from the biopsy marker clip, demonstrating plateau and washout kinetics, corresponding to the biopsy-proven malignancy. Clumped non mass enhancement involving the lower outer quadrant at anterior depth measuring approximately 3.2 x 2.0 x 2.5 cm, demonstrating plateau and washout kinetics (series 10601, images 161-173). Linear non mass enhancement involving the lower outer quadrant at posterior depth measuring approximately 1.7 x 0.5 x 0.8 cm, demonstrating plateau kinetics (series 10601, images 143-149). In total, the non mass enhancement  in the lower outer quadrant spans approximately 7 cm. Non mass enhancement involving the outer right breast at posterior depth, approximate 9 o'clock location, measuring approximately 0.9 x 0.5 x 0.8 cm, demonstrating benign progressive kinetics (series 10601, images 106-113). Left breast: Linear non mass enhancement involving the upper outer quadrant at middle depth measuring approximately 1.2 x 0.3 x 0.5 cm, demonstrating washout kinetics (series 10601, images 91-101). Enhancing mass involving the upper inner quadrant at anterior to middle depth, at the approximate 12:30 o'clock position, measuring 0.8 x 0.3 x 0.6 cm, demonstrating washout kinetics (series 10601, images 65-71). Non mass enhancement involving the upper outer quadrant at middle depth measuring 1.5 x 1.0 x 0.8 cm, demonstrating benign progressive kinetics  (series 10601, images 73- 82). Lymph nodes: No pathologic lymphadenopathy. Ancillary findings:  None. IMPRESSION: 1. Biopsy-proven malignancy involving the upper inner quadrant of the right breast measuring approximately 1.8 x 1.7 x 2.5 cm. 2. 2 indeterminate foci of non mass enhancement involving the lower outer quadrant of the right breast at anterior and posterior depth. Measurements are given above. The Beck span of non mass enhancement in the lower outer quadrant measures approximately 7 cm. 3. Indeterminate focus of non mass enhancement involving the upper outer quadrant of the left breast at middle depth. Measurements given above. 4. Indeterminate mass (maximum measurement 0.8 cm) involving the upper inner quadrant of the left breast at anterior to middle depth at the approximate 12:30 o'clock position. 5. Non mass enhancement involving the outer right breast at posterior depth at the approximate 9 o'clock position and in the upper outer quadrant of the left breast at middle depth which demonstrate benign enhancement kinetics and therefore likely represent functional breast tissue. 6. No pathologic lymphadenopathy. (Extreme background parenchymal enhancement lowers the specificity of MRI, therefore, any of these areas of non mass enhancement may represent functioning breast tissue. RECOMMENDATION: 1. MRI biopsy of the 2 indeterminate foci of non mass enhancement involving the lower outer quadrant of the right breast at both anterior and posterior depth (if persistent). 2. MRI biopsy of the indeterminate focus of non mass enhancement involving the upper outer quadrant of the left breast and the indeterminate mass involving the upper inner quadrant of the left breast (if persistent). The MRI biopsy should be scheduled between day 7 and 14 of the patient's menstrual cycle in order to minimize background enhancement. BI-RADS CATEGORY  4: Suspicious. Electronically Signed   By: Evangeline Dakin M.D.   On:  03/29/2017 09:27   Dg Chest Port 1 View  Result Date: 04/02/2017 CLINICAL DATA:  Postop for Port-A-Cath placement.  Breast cancer. EXAM: PORTABLE CHEST 1 VIEW COMPARISON:  Breast MR 03/28/2017 FINDINGS: Right-sided Port-A-Cath terminates at the high SVC. Midline trachea. Normal heart size and mediastinal contours. No pleural effusion or pneumothorax. Clear lungs. IMPRESSION: Right-sided Port-A-Cath terminating at the high SVC; no pneumothorax. Electronically Signed   By: Abigail Miyamoto M.D.   On: 04/02/2017 11:43   Dg C-arm 1-60 Min-no Report  Result Date: 04/02/2017 Fluoroscopy was utilized by the requesting physician.  No radiographic interpretation.   US Breast Ltd Uni Right Inc Axilla  Result Date: 03/18/2017 CLINICAL DATA:  51 year old female presenting for evaluation of a palpable mass lump in the right breast. EXAM: 2D DIGITAL DIAGNOSTIC UNILATERAL RIGHT MAMMOGRAM WITH CAD AND ADJUNCT TOMO RIGHT BREAST ULTRASOUND COMPARISON:  Previous exam(s). ACR Breast Density Category d: The breast tissue is extremely dense, which lowers the sensitivity of mammography. FINDINGS: A BB has been placed on  the upper-inner quadrant of the right breast indicating the palpable site of concern. Deep to the palpable marker on the spot compression tomosynthesis images there is an obscured mass. Mammographic images were processed with CAD. Ultrasound targeted to the right breast at 12:30, 4 cm from the nipple demonstrates an irregular hypoechoic mass with indistinct margins measuring 2.4 x 1.3 x 1.9 cm. Blood flow was identified within the mass on color Doppler imaging. Ultrasound of the right axilla demonstrates multiple normal-appearing lymph nodes. IMPRESSION: 1. The palpable right breast mass at 12:30 is indeterminate, and biopsy is warranted. 2.  No evidence of right axillary lymphadenopathy. RECOMMENDATION: Ultrasound-guided biopsy is recommended for the right breast mass. This has been scheduled for 05/21/2017 at  1:45 p.m. I have discussed the findings and recommendations with the patient. Results were also provided in writing at the conclusion of the visit. If applicable, a reminder letter will be sent to the patient regarding the next appointment. BI-RADS CATEGORY  4: Suspicious. Electronically Signed   By: Ammie Ferrier M.D.   On: 03/18/2017 14:07   Mm Diag Breast Tomo Uni Right  Result Date: 03/18/2017 CLINICAL DATA:  51 year old female presenting for evaluation of a palpable mass lump in the right breast. EXAM: 2D DIGITAL DIAGNOSTIC UNILATERAL RIGHT MAMMOGRAM WITH CAD AND ADJUNCT TOMO RIGHT BREAST ULTRASOUND COMPARISON:  Previous exam(s). ACR Breast Density Category d: The breast tissue is extremely dense, which lowers the sensitivity of mammography. FINDINGS: A BB has been placed on the upper-inner quadrant of the right breast indicating the palpable site of concern. Deep to the palpable marker on the spot compression tomosynthesis images there is an obscured mass. Mammographic images were processed with CAD. Ultrasound targeted to the right breast at 12:30, 4 cm from the nipple demonstrates an irregular hypoechoic mass with indistinct margins measuring 2.4 x 1.3 x 1.9 cm. Blood flow was identified within the mass on color Doppler imaging. Ultrasound of the right axilla demonstrates multiple normal-appearing lymph nodes. IMPRESSION: 1. The palpable right breast mass at 12:30 is indeterminate, and biopsy is warranted. 2.  No evidence of right axillary lymphadenopathy. RECOMMENDATION: Ultrasound-guided biopsy is recommended for the right breast mass. This has been scheduled for 05/21/2017 at 1:45 p.m. I have discussed the findings and recommendations with the patient. Results were also provided in writing at the conclusion of the visit. If applicable, a reminder letter will be sent to the patient regarding the next appointment. BI-RADS CATEGORY  4: Suspicious. Electronically Signed   By: Ammie Ferrier M.D.    On: 03/18/2017 14:07   Mm Clip Placement Left  Result Date: 04/11/2017 CLINICAL DATA:  Status post bilateral MRI guided breast biopsies, 2 sites of non mass enhancement within each breast (4 total sites). Recently diagnosed right breast cancer. EXAM: DIAGNOSTIC BILATERAL MAMMOGRAM POST MRI BIOPSY COMPARISON:  Previous exam(s). FINDINGS: Mammographic images were obtained following MRI guided biopsy of 4 sites of abnormal non mass enhancement within the bilateral breast (2 sites for breast). All clips appear appropriately positioned. IMPRESSION: 1. Biopsy site 1: Cylinder shaped clip is appropriately positioned within the upper inner quadrant of the left breast. 2. Biopsy site 2: Barbell shaped clip is appropriately positioned within the upper outer quadrant of the left breast. 3. Biopsy site 3: Cylinder shaped clip is appropriately positioned within the lower inner quadrant of the right breast. 4. Biopsy site 4: Barbell shaped clip is appropriately position within the lower outer quadrant of the right breast. A ribbon shaped clip within the right  breast corresponds to the site of a recent biopsy-proven breast cancer. Final Assessment: Post Procedure Mammograms for Marker Placement Electronically Signed   By: Franki Cabot M.D.   On: 04/11/2017 11:24   Mm Clip Placement Right  Result Date: 04/11/2017 CLINICAL DATA:  Status post bilateral MRI guided breast biopsies, 2 sites of non mass enhancement within each breast (4 total sites). Recently diagnosed right breast cancer. EXAM: DIAGNOSTIC BILATERAL MAMMOGRAM POST MRI BIOPSY COMPARISON:  Previous exam(s). FINDINGS: Mammographic images were obtained following MRI guided biopsy of 4 sites of abnormal non mass enhancement within the bilateral breast (2 sites for breast). All clips appear appropriately positioned. IMPRESSION: 1. Biopsy site 1: Cylinder shaped clip is appropriately positioned within the upper inner quadrant of the left breast. 2. Biopsy site 2:  Barbell shaped clip is appropriately positioned within the upper outer quadrant of the left breast. 3. Biopsy site 3: Cylinder shaped clip is appropriately positioned within the lower inner quadrant of the right breast. 4. Biopsy site 4: Barbell shaped clip is appropriately position within the lower outer quadrant of the right breast. A ribbon shaped clip within the right breast corresponds to the site of a recent biopsy-proven breast cancer. Final Assessment: Post Procedure Mammograms for Marker Placement Electronically Signed   By: Franki Cabot M.D.   On: 04/11/2017 11:24   Mm Clip Placement Right  Result Date: 03/21/2017 CLINICAL DATA:  Post ultrasound-guided biopsy of a suspicious mass in the right breast at the 12:30 position. EXAM: DIAGNOSTIC RIGHT MAMMOGRAM POST ULTRASOUND BIOPSY COMPARISON:  Previous exam(s). FINDINGS: Mammographic images were obtained following ultrasound guided biopsy of a suspicious mass in the right breast at the 12:30 position. Ribbon shaped biopsy marking clip is present at the site of the biopsied mass in the right breast. IMPRESSION: Appropriate position of ribbon shaped biopsy marking clip post ultrasound-guided biopsy of a mass in the right breast at the 12:30 position. Final Assessment: Post Procedure Mammograms for Marker Placement Electronically Signed   By: Everlean Alstrom M.D.   On: 03/21/2017 14:38   Mr Aundra Millet Breast Bx Johnella Moloney Dev 1st Lesion Image Bx Spec Mr Guide  Addendum Date: 04/12/2017   ADDENDUM REPORT: 04/12/2017 10:58 ADDENDUM: PATHOLOGY: 1.  Left breast, upper inner quadrant - hemorrhage. 2.  Left breast, upper outer quadrant -ALH in sclerosing adenosis 3.  Right breast, lower inner quadrant - grade 2 IMC and MCIS 4. Right breast, lower outer quadrant- MCIS, sclerosing adenosis, FCC CONCORDANT: 1.  Discordant - surgical excision recommended 2.  Concordant - surgical excision recommended. 3.  Concordant 4.  Concordant I telephoned the patient on 04/12/2017 at  10 a.m. and discussed these results and the recommendations stated below. All questions were answered. The patient denies significant pain or bleeding from the biopsy site. Patient was instructed to contact the [place] if pain increased or bleeding reoccurred. RECOMMENDATION: Surgical excision for all biopsy sites if breast conservation is considered. Also, there is an additional focus of non mass enhancement within the upper left breast, 12 o'clock axis region, between the 2 biopsy sites, for which additional MRI guided biopsy is recommended if breast conservation is considered for the left breast. Findings and recommendations discussed with Dr. Lucia Gaskins. Electronically Signed   By: Franki Cabot M.D.   On: 04/12/2017 10:58   Result Date: 04/12/2017 CLINICAL DATA:  Recently diagnosed right triple negative breast cancer. Subsequent breast MRI showing 2 suspicious sites of non mass enhancement within each breast (4 sites  total) for which MRI guided biopsies were recommended. Patient presents today for the MRI guided biopsies. EXAM: MRI GUIDED CORE NEEDLE BIOPSY OF THE BILATERAL BREAST TECHNIQUE: Multiplanar, multisequence MR imaging of the bilateral breasts was performed both before and after administration of intravenous contrast. CONTRAST:  64m MULTIHANCE GADOBENATE DIMEGLUMINE 529 MG/ML IV SOLN COMPARISON:  Previous exams. FINDINGS: I met with the patient, and we discussed the procedure of MRI guided biopsy, including risks, benefits, and alternatives. Specifically, we discussed the risks of infection, bleeding, tissue injury, clip migration, and inadequate sampling. Informed, written consent was given. The usual time out protocol was performed immediately prior to the procedure. Left breast: Using sterile technique, 1% Lidocaine, MRI guidance, and a 9 gauge vacuum assisted device, biopsy was performed of linear non mass enhancement within the upper inner quadrant of the left breast,  at anterior depth, using a lateral approach. At the conclusion of the procedure, a cylinder shaped tissue marker clip was deployed into the biopsy cavity. Next, using sterile technique, 1% lidocaine, MRI guidance, and a 9 gauge vacuum assisted device, biopsy was performed of non mass enhancement within the upper-outer quadrant of the left breast, at middle depth, using a lateral approach. At the conclusion of the procedure, a barbell shaped tissue marker clip was deployed into the biopsy cavity. Right breast: Next, using sterile technique, 1% lidocaine, MRI guidance, and a 9 gauge vacuum assisted device, biopsy was performed of the focal non mass enhancement within the lower inner quadrant of the right breast, at anterior depth, using a lateral approach. At the conclusion of the procedure, a cylinder shaped tissue marker clip was deployed into the biopsy cavity. Next, using sterile technique, 1% lidocaine, MRI guidance, and a 9 gauge vacuum assisted device, biopsy was performed of the linear non mass enhancement within the lower outer quadrant of the right breast, at posterior depth, using a lateral approach. At the conclusion of the procedure, a barbell shaped tissue clip was deployed in the biopsy cavity. Follow-up 2-view mammogram was performed and dictated separately. IMPRESSION: MRI guided biopsy of 2 sites of abnormal non mass enhancement within each breast (4 total sites), as detailed above. No apparent complications. Electronically Signed: By: SFranki CabotM.D. On: 04/11/2017 11:10   Mr LAundra MilletBreast Bx W Loc Dev Ea Add Lesion Image Bx Spec Mr Guide  Addendum Date: 04/12/2017   ADDENDUM REPORT: 04/12/2017 10:58 ADDENDUM: PATHOLOGY: 1.  Left breast, upper inner quadrant - hemorrhage. 2.  Left breast, upper outer quadrant -ALH in sclerosing adenosis 3.  Right breast, lower inner quadrant - grade 2 IMC and MCIS 4. Right breast, lower outer quadrant- MCIS, sclerosing adenosis, FCC CONCORDANT: 1.  Discordant -  surgical excision recommended 2.  Concordant - surgical excision recommended. 3.  Concordant 4.  Concordant I telephoned the patient on 04/12/2017 at 10 a.m. and discussed these results and the recommendations stated below. All questions were answered. The patient denies significant pain or bleeding from the biopsy site. [place]. RECOMMENDATION: Surgical excision for all biopsy sites if breast conservation is considered. Also, there is an additional focus of non mass enhancement within the upper left breast, 12 o'clock axis region, between the 2 biopsy sites, for which additional MRI guided biopsy is recommended if breast conservation is considered for the left breast. Findings and recommendations discussed with Dr. NLucia Gaskins Electronically Signed   By: SFranki CabotM.D.   On:  04/12/2017 10:58   Result Date: 04/12/2017 CLINICAL DATA:  Recently diagnosed right triple negative breast cancer. Subsequent breast MRI showing 2 suspicious sites of non mass enhancement within each breast (4 sites total) for which MRI guided biopsies were recommended. Patient presents today for the MRI guided biopsies. EXAM: MRI GUIDED CORE NEEDLE BIOPSY OF THE BILATERAL BREAST TECHNIQUE: Multiplanar, multisequence MR imaging of the bilateral breasts was performed both before and after administration of intravenous contrast. CONTRAST:  66m MULTIHANCE GADOBENATE DIMEGLUMINE 529 MG/ML IV SOLN COMPARISON:  Previous exams. FINDINGS: I met with the patient, and we discussed the procedure of MRI guided biopsy, including risks, benefits, and alternatives. Specifically, we discussed the risks of infection, bleeding, tissue injury, clip migration, and inadequate sampling. Informed, written consent was given. The usual time out protocol was performed immediately prior to the procedure. Left breast: Using sterile technique, 1% Lidocaine, MRI  guidance, and a 9 gauge vacuum assisted device, biopsy was performed of linear non mass enhancement within the upper inner quadrant of the left breast, at anterior depth, using a lateral approach. At the conclusion of the procedure, a cylinder shaped tissue marker clip was deployed into the biopsy cavity. Next, using sterile technique, 1% lidocaine, MRI guidance, and a 9 gauge vacuum assisted device, biopsy was performed of non mass enhancement within the upper-outer quadrant of the left breast, at middle depth, using a lateral approach. At the conclusion of the procedure, a barbell shaped tissue marker clip was deployed into the biopsy cavity. Right breast: Next, using sterile technique, 1% lidocaine, MRI guidance, and a 9 gauge vacuum assisted device, biopsy was performed of the focal non mass enhancement within the lower inner quadrant of the right breast, at anterior depth, using a lateral approach. At the conclusion of the procedure, a cylinder shaped tissue marker clip was deployed into the biopsy cavity. Next, using sterile technique, 1% lidocaine, MRI guidance, and a 9 gauge vacuum assisted device, biopsy was performed of the linear non mass enhancement within the lower outer quadrant of the right breast, at posterior depth, using a lateral approach. At the conclusion of the procedure, a barbell shaped tissue clip was deployed in the biopsy cavity. Follow-up 2-view mammogram was performed and dictated separately. IMPRESSION: MRI guided biopsy of 2 sites of abnormal non mass enhancement within each breast (4 total sites), as detailed above. No apparent complications. Electronically Signed: By: SFranki CabotM.D. On: 04/11/2017 11:10   [place] Breast Bx W Loc Dev 1st Lesion Img Bx Spec [place]Guide  Addendum Date: 04/02/2017   ADDENDUM REPORT: 03/22/2017 12:55 ADDENDUM: Pathology revealed GRADE III INVASIVE DUCTAL CARCINOMA, LYMPHOVASCULAR INVASION IS IDENTIFIED of the Right breast, 12:30 o'clock. This was found  to be concordant by Dr. JEverlean Alstrom Pathology results were discussed with the patient by telephone. The patient reported doing well after the biopsy with tenderness and minimal bleeding at the site. Post biopsy instructions and care were reviewed and questions were answered. The patient was encouraged to [facility]. [facility] Recommendation for bilateral breast MRI due to extremely dense breast tissue. Pathology results reported by LTerie Purser RN on 03/22/2017. Electronically Signed   By: JEverlean AlstromM.D.   On: 03/22/2017 12:55   Result Date: 04/02/2017 CLINICAL DATA:  51year old female with a suspicious mass in the right breast at the 12:30  position. EXAM: ULTRASOUND GUIDED RIGHT BREAST CORE NEEDLE BIOPSY COMPARISON:  Previous exam(s). FINDINGS: I met with the patient and we discussed the procedure of ultrasound-guided biopsy, including benefits and alternatives. We discussed the high likelihood of a successful procedure. We discussed the risks of the procedure, including infection, bleeding, tissue injury, clip migration, and inadequate sampling. Informed written consent was given. The usual time-out protocol was performed immediately prior to the procedure. Lesion quadrant: Upper inner Using sterile technique and 1% Lidocaine as local anesthetic, under direct ultrasound visualization, a 12 gauge spring-loaded device was used to perform biopsy of the mass in the right breast at the 12:30 position using a lateral to medial approach. At the conclusion of the procedure a ribbon shaped tissue marker clip was deployed into the biopsy cavity. Follow up 2 view mammogram was performed and dictated separately. IMPRESSION: Ultrasound guided biopsy of the mass in the right breast at the 12:30 position. No apparent  complications. Electronically Signed: By: Everlean Alstrom M.D. On: 03/21/2017 14:26   Mr Rick Duff Breast Bx W Loc Dev 1st Lesion Image Bx Spec Mr Guide  Addendum Date: 04/12/2017   ADDENDUM REPORT: 04/12/2017 10:58 ADDENDUM: PATHOLOGY: 1.  Left breast, upper inner quadrant - hemorrhage. 2.  Left breast, upper outer quadrant -ALH in sclerosing adenosis 3.  Right breast, lower inner quadrant - grade 2 IMC and MCIS 4. Right breast, lower outer quadrant- MCIS, sclerosing adenosis, FCC CONCORDANT: 1.  Discordant - surgical excision recommended 2.  Concordant - surgical excision recommended. 3.  Concordant 4.  Concordant I telephoned the patient on 04/12/2017 at 10 a.m. and discussed these results and the recommendations stated below. All questions were answered. The patient denies significant pain or bleeding from the biopsy site. Patient was instructed to contact the [place] if pain increased or bleeding reoccurred. RECOMMENDATION: Surgical excision for all biopsy sites if breast conservation is considered. Also, there is an additional focus of non mass enhancement within the upper left breast, 12 o'clock axis region, between the 2 biopsy sites, for which additional MRI guided biopsy is recommended if breast conservation is considered for the left breast. Findings and recommendations discussed with Dr. Lucia Gaskins. Electronically Signed   By: Franki Cabot M.D.   On: 04/12/2017 10:58   Result Date: 04/12/2017 CLINICAL DATA:  Recently diagnosed right triple negative breast cancer. Subsequent breast MRI showing 2 suspicious sites of non mass enhancement within each breast (4 sites total) for which MRI guided biopsies were recommended. Patient presents today for the MRI guided biopsies. EXAM: MRI GUIDED CORE NEEDLE BIOPSY OF THE BILATERAL BREAST TECHNIQUE: Multiplanar, multisequence MR imaging of the bilateral breasts was performed both before and after administration of intravenous contrast.  CONTRAST:  65m MULTIHANCE GADOBENATE DIMEGLUMINE 529 MG/ML IV SOLN COMPARISON:  Previous exams. FINDINGS: I met with the patient, and we discussed the procedure of MRI guided biopsy, including risks, benefits, and alternatives. Specifically, we discussed the risks of infection, bleeding, tissue injury, clip migration, and inadequate sampling. Informed, written consent was given. The usual time out protocol was performed immediately prior to the procedure. Left breast: Using sterile technique, 1% Lidocaine, MRI guidance, and a 9 gauge vacuum assisted device, biopsy was performed of linear non mass enhancement within the upper inner quadrant of the left breast, at anterior depth, using a lateral approach. At the conclusion of the procedure, a cylinder shaped tissue marker clip was deployed into the biopsy cavity. Next, using sterile technique, 1% lidocaine, MRI guidance, and  a 9 gauge vacuum assisted device, biopsy was performed of non mass enhancement within the upper-outer quadrant of the left breast, at middle depth, using a lateral approach. At the conclusion of the procedure, a barbell shaped tissue marker clip was deployed into the biopsy cavity. Right breast: Next, using sterile technique, 1% lidocaine, MRI guidance, and a 9 gauge vacuum assisted device, biopsy was performed of the focal non mass enhancement within the lower inner quadrant of the right breast, at anterior depth, using a lateral approach. At the conclusion of the procedure, a cylinder shaped tissue marker clip was deployed into the biopsy cavity. Next, using sterile technique, 1% lidocaine, MRI guidance, and a 9 gauge vacuum assisted device, biopsy was performed of the linear non mass enhancement within the lower outer quadrant of the right breast, at posterior depth, using a lateral approach. At the conclusion of the procedure, a barbell shaped tissue clip was deployed in the biopsy cavity. Follow-up 2-view mammogram was performed and dictated  separately. IMPRESSION: MRI guided biopsy of 2 sites of abnormal non mass enhancement within each breast (4 total sites), as detailed above. No apparent complications. Electronically Signed: By: Franki Cabot M.D. On: 04/11/2017 11:10   Mr Rt Breast Bx W Loc Dev Ea Add Lesion Image Bx Spec Mr Guide  Addendum Date: 04/12/2017   ADDENDUM REPORT: 04/12/2017 10:58 ADDENDUM: PATHOLOGY: 1.  Left breast, upper inner quadrant - hemorrhage. 2.  Left breast, upper outer quadrant -ALH in sclerosing adenosis 3.  Right breast, lower inner quadrant - grade 2 IMC and MCIS 4. Right breast, lower outer quadrant- MCIS, sclerosing adenosis, FCC CONCORDANT: 1.  Discordant - surgical excision recommended 2.  Concordant - surgical excision recommended. 3.  Concordant 4.  Concordant I telephoned the patient on 04/12/2017 at 10 a.m. and discussed these results and the recommendations stated below. All questions were answered. The patient denies significant pain or bleeding from the biopsy site. Patient was instructed to contact the [place] if pain increased or bleeding reoccurred. RECOMMENDATION: Surgical excision for all biopsy sites if breast conservation is considered. Also, there is an additional focus of non mass enhancement within the upper left breast, 12 o'clock axis region, between the 2 biopsy sites, for which additional MRI guided biopsy is recommended if breast conservation is considered for the left breast. Findings and recommendations discussed with Dr. Lucia Gaskins. Electronically Signed   By: Franki Cabot M.D.   On: 04/12/2017 10:58   Result Date: 04/12/2017 CLINICAL DATA:  Recently diagnosed right triple negative breast cancer. Subsequent breast MRI showing 2 suspicious sites of non mass enhancement within each breast (4 sites total) for which MRI guided biopsies were recommended. Patient presents today for the MRI guided biopsies. EXAM: MRI GUIDED CORE NEEDLE BIOPSY OF THE BILATERAL BREAST  TECHNIQUE: Multiplanar, multisequence MR imaging of the bilateral breasts was performed both before and after administration of intravenous contrast. CONTRAST:  50m MULTIHANCE GADOBENATE DIMEGLUMINE 529 MG/ML IV SOLN COMPARISON:  Previous exams. FINDINGS: I met with the patient, and we discussed the procedure of MRI guided biopsy, including risks, benefits, and alternatives. Specifically, we discussed the risks of infection, bleeding, tissue injury, clip migration, and inadequate sampling. Informed, written consent was given. The usual time out protocol was performed immediately prior to the procedure. Left breast: Using sterile technique, 1% Lidocaine, MRI guidance, and a 9 gauge vacuum assisted device, biopsy was performed of linear non mass enhancement within the upper inner quadrant of the left breast, at  anterior depth, using a lateral approach. At the conclusion of the procedure, a cylinder shaped tissue marker clip was deployed into the biopsy cavity. Next, using sterile technique, 1% lidocaine, MRI guidance, and a 9 gauge vacuum assisted device, biopsy was performed of non mass enhancement within the upper-outer quadrant of the left breast, at middle depth, using a lateral approach. At the conclusion of the procedure, a barbell shaped tissue marker clip was deployed into the biopsy cavity. Right breast: Next, using sterile technique, 1% lidocaine, MRI guidance, and a 9 gauge vacuum assisted device, biopsy was performed of the focal non mass enhancement within the lower inner quadrant of the right breast, at anterior depth, using a lateral approach. At the conclusion of the procedure, a cylinder shaped tissue marker clip was deployed into the biopsy cavity. Next, using sterile technique, 1% lidocaine, MRI guidance, and a 9 gauge vacuum assisted device, biopsy was performed of the linear non mass enhancement within the lower outer quadrant of the right breast, at posterior depth, using a lateral approach. At  the conclusion of the procedure, a barbell shaped tissue clip was deployed in the biopsy cavity. Follow-up 2-view mammogram was performed and dictated separately. IMPRESSION: MRI guided biopsy of 2 sites of abnormal non mass enhancement within each breast (4 total sites), as detailed above. No apparent complications. Electronically Signed: By: Franki Cabot M.D. On: 04/11/2017 11:10    ELIGIBLE FOR AVAILABLE RESEARCH PROTOCOL:UPBEAT trial --patient refused  ASSESSMENT: 51 y.o. [place] woman status post right breast upper inner quadrant biopsy March 21, 2017 for a clinical T2 N0  Stage IIB invasive ductal carcinoma, grade 3, triple negative, with an MIB-1 of 80%.  (a) biopsy of 2 additional suspicious areas in the right breast and one in the left breast scheduled for April 11, 2017  (1) neoadjuvant chemotherapy will consist of doxorubicin and cyclophosphamide in dose dense fashion x4, followed by paclitaxel/carboplatin weekly x12  (2) definitive surgery to follow  (3) adjuvant radiation as appropriate  (4) genetics testing scheduled for 04/25/2017  PLAN: Deanndra tolerated chemotherapy relatively well.  Her CBC is normal today and I reviewed that with her and her duaghter in detail.  The most troublesome side effect she has had has been the reflux likely due to gastric irritation from the Dexamethasone.  This is improved with Pepcid BID, which she will continue.  She and I reviewed her nausea medication, which she took correctly, and she knows how to take it with cycle 2.  Meya will return in 1 week for labs, follow up with Dr. Jana Hakim, and cycle 2 of Doxorubicin and Cyclophosphamide.    She knows to call for any questions or concerns prior to her next appointment with [place].    A total of (30) minutes of face-to-face time was spent with this patient with greater than 50% of that time in counseling and care-coordination.     Wilber Bihari, NP  04/17/17 10:31 AM Medical Oncology  and Hematology [facility] [address] [place], [place] [address] Tel. [phone]    Fax. [phone]

## 2017-04-17 NOTE — Patient Instructions (Signed)
Implanted Port Home Guide An implanted port is a type of central line that is placed under the skin. Central lines are used to provide IV access when treatment or nutrition needs to be given through a person's veins. Implanted ports are used for long-term IV access. An implanted port may be placed because:  You need IV medicine that would be irritating to the small veins in your hands or arms.  You need long-term IV medicines, such as antibiotics.  You need IV nutrition for a long period.  You need frequent blood draws for lab tests.  You need dialysis.  Implanted ports are usually placed in the chest area, but they can also be placed in the upper arm, the abdomen, or the leg. An implanted port has two main parts:  Reservoir. The reservoir is round and will appear as a small, raised area under your skin. The reservoir is the part where a needle is inserted to give medicines or draw blood.  Catheter. The catheter is a thin, flexible tube that extends from the reservoir. The catheter is placed into a large vein. Medicine that is inserted into the reservoir goes into the catheter and then into the vein.  How will I care for my incision site? Do not get the incision site wet. Bathe or shower as directed by your health care provider. How is my port accessed? Special steps must be taken to access the port:  Before the port is accessed, a numbing cream can be placed on the skin. This helps numb the skin over the port site.  Your health care provider uses a sterile technique to access the port. ? Your health care provider must put on a mask and sterile gloves. ? The skin over your port is cleaned carefully with an antiseptic and allowed to dry. ? The port is gently pinched between sterile gloves, and a needle is inserted into the port.  Only "non-coring" port needles should be used to access the port. Once the port is accessed, a blood return should be checked. This helps ensure that the port  is in the vein and is not clogged.  If your port needs to remain accessed for a constant infusion, a clear (transparent) bandage will be placed over the needle site. The bandage and needle will need to be changed every week, or as directed by your health care provider.  Keep the bandage covering the needle clean and dry. Do not get it wet. Follow your health care provider's instructions on how to take a shower or bath while the port is accessed.  If your port does not need to stay accessed, no bandage is needed over the port.  What is flushing? Flushing helps keep the port from getting clogged. Follow your health care provider's instructions on how and when to flush the port. Ports are usually flushed with saline solution or a medicine called heparin. The need for flushing will depend on how the port is used.  If the port is used for intermittent medicines or blood draws, the port will need to be flushed: ? After medicines have been given. ? After blood has been drawn. ? As part of routine maintenance.  If a constant infusion is running, the port may not need to be flushed.  How long will my port stay implanted? The port can stay in for as long as your health care provider thinks it is needed. When it is time for the port to come out, surgery will be   done to remove it. The procedure is similar to the one performed when the port was put in. When should I seek immediate medical care? When you have an implanted port, you should seek immediate medical care if:  You notice a bad smell coming from the incision site.  You have swelling, redness, or drainage at the incision site.  You have more swelling or pain at the port site or the surrounding area.  You have a fever that is not controlled with medicine.  This information is not intended to replace advice given to you by your health care provider. Make sure you discuss any questions you have with your health care provider. Document  Released: 05/14/2005 Document Revised: 10/20/2015 Document Reviewed: 01/19/2013 Elsevier Interactive Patient Education  2017 Elsevier Inc.  

## 2017-04-19 ENCOUNTER — Encounter: Payer: Self-pay | Admitting: *Deleted

## 2017-04-22 ENCOUNTER — Other Ambulatory Visit: Payer: 59

## 2017-04-22 ENCOUNTER — Ambulatory Visit (HOSPITAL_BASED_OUTPATIENT_CLINIC_OR_DEPARTMENT_OTHER): Payer: 59 | Admitting: Genetics

## 2017-04-22 ENCOUNTER — Encounter: Payer: Self-pay | Admitting: Genetics

## 2017-04-22 DIAGNOSIS — Z8051 Family history of malignant neoplasm of kidney: Secondary | ICD-10-CM | POA: Diagnosis not present

## 2017-04-22 DIAGNOSIS — C50411 Malignant neoplasm of upper-outer quadrant of right female breast: Secondary | ICD-10-CM | POA: Diagnosis not present

## 2017-04-22 DIAGNOSIS — Z171 Estrogen receptor negative status [ER-]: Principal | ICD-10-CM

## 2017-04-22 DIAGNOSIS — Z803 Family history of malignant neoplasm of breast: Secondary | ICD-10-CM | POA: Diagnosis not present

## 2017-04-22 DIAGNOSIS — Z800 Family history of malignant neoplasm of digestive organs: Secondary | ICD-10-CM

## 2017-04-22 HISTORY — DX: Family history of malignant neoplasm of breast: Z80.3

## 2017-04-22 HISTORY — DX: Family history of malignant neoplasm of kidney: Z80.51

## 2017-04-22 HISTORY — DX: Family history of malignant neoplasm of digestive organs: Z80.0

## 2017-04-22 NOTE — Progress Notes (Signed)
REFERRING PROVIDER: Chauncey Cruel, MD [address] [place],  [address]  PRIMARY PROVIDER:  Coralyn Mark Altamese Cabal, MD  PRIMARY REASON FOR VISIT:  1. Malignant neoplasm of upper-outer quadrant of right breast in female, estrogen receptor negative ([place])   2. Family history of breast cancer   3. Family history of colon cancer   4. Family history of kidney cancer      HISTORY OF PRESENT ILLNESS:   Jennifer Beck, a 51 y.o. female, was seen for a [facility] cancer genetics consultation at the request of Dr. Jana Hakim due to a personal and family history of cancer.  Jennifer Beck presents to clinic today to discuss the possibility of a hereditary predisposition to cancer, genetic testing, and to further clarify her future cancer risks, as well as potential cancer risks for family members.   In Oct 2018, at the age of 68, [name] was diagnosed with invasive ductal carcinoma of the right breast, Triple negative.  A 2nd lesion was biopsied on 04/21/2017 that identified  identified mammary carcinoma. She is currently receiving chemotherapy, and is still considering breast surgical options at this time. She is considering mastectomy and bilateral mastectomy at this time.     CANCER HISTORY:   No history exists.     HORMONAL RISK FACTORS:  Menarche was at age 70.  First live birth at age 34.  Ovaries intact: yes.  Hysterectomy: no.  HRT use: 0 years.  Past Medical History:  Diagnosis Date  . Chest pain   . Complication of anesthesia   . Endometriosis   . Family history of breast cancer 04/22/2017  . Family history of colon cancer 04/22/2017  . Family history of kidney cancer 04/22/2017  . Fatigue   . GERD (gastroesophageal reflux disease)   . GI bleed    caused by ischemic colitis following an episode of hypertension  . Heart palpitations   . Hx of echocardiogram    a. echo 9/13:  EF 55-60%, Gr 2 diast dysfn  . Hypercholesterolemia   . Ischemic colitis ([place])   .  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 Myocarditis ([place])   . Pericarditis   . PONV (postoperative nausea and vomiting)   . SOB (shortness of breath)     Past Surgical History:  Procedure Laterality Date  . CARDIAC CATHETERIZATION  08/26/2001   EF of 60% --- smooth & normal coronary arteries -- there is a Star motion defect consistent with posterolateral MI -- she quite possibly had a coronary spasm -- she may have some pericarditis secondary to her MI   . DILATION AND CURETTAGE OF UTERUS  10/15/2003   Uterine enlargement, menorrhagia  . DILATION AND CURETTAGE OF UTERUS  05/27/2002   Dysfunctional uterine bleeding  . LAPAROSCOPY    . NOVASURE ABLATION  10/15/2003   for management of her extreme menorrhagi  . PORTACATH PLACEMENT Right 04/02/2017   Procedure: INSERTION PORT-A-CATH;  Surgeon: [name], MD;  Location: [facility];  Service: General;  Laterality: Right;  . TUBAL LIGATION  09/2000   bilateral    Social History   Socioeconomic History  . Marital status: Married    Spouse name: Not on file  . Number of children: Not on file  . Years of education: Not on file  . Highest education level: Not on file  Social Needs  . Financial resource strain: Not on file  . Food insecurity - worry:  Not on file  . Food insecurity - inability: Not on file  . Transportation needs - medical: Not on file  . Transportation needs - non-medical: Not on file  Occupational History  . Not on file  Tobacco Use  . Smoking status: Never Smoker  . Smokeless tobacco: Never Used  Substance and Sexual Activity  . Alcohol use: Yes    Comment: Occasionally drinks wine  . Drug use: No  . Sexual activity: Not on file  Other Topics Concern  . Not on file  Social History Narrative  . Not on file     FAMILY HISTORY:  We obtained a detailed, 4-generation family history.  Significant diagnoses are listed below:  Family History  Problem  Relation Age of Onset  . Hypertension Father   . Diabetes Father   . Other Father        stomach mass suspicious for ca, never bx  . Coronary artery disease Mother   . Kidney cancer Mother 2  . Coronary artery disease Maternal Grandfather   . Colon cancer Maternal Grandfather 61       recurrence  . Coronary artery disease Maternal Uncle   . Coronary artery disease Maternal Uncle   . Diabetes Maternal Uncle   . Colon cancer Maternal Grandmother 4  . Breast cancer Cousin 60       had GT- CHEK2+  . Cervical cancer Maternal Aunt 35  . Diabetes Maternal Aunt   . Alzheimer's disease Paternal Grandmother   . Colon cancer Paternal Grandfather 35  . Breast cancer Paternal Aunt   . Breast cancer Paternal Aunt   . Breast cancer Other 33    Jennifer Beck has a 67 year-old daughter with no history of cancer.  She has a 42 year-old brother with no history of cancer.  Her brother has a 38 year-old son and a 70 year-old daughter.    Jennifer Beck father died at the age of 49 due to dementia.  Prior to his death, a stomach mass was found that was suspicious for cancer.  This was never biopsied and followed up as he was already in hospice at this time.  Jennifer Beck has 1 full paternal uncle who died in an accident.  He had 3 daughters and 3 sons who are in their 79's and 29's with no history of cancer.  Jennifer Beck also reports that she has several paternal half- aunts and uncles (through paternal grandmother).  She does not know a lot of information about them, but knows that 2 paternal half-aunts had breast cancer.  The age of diagnosis is unknown.  Jennifer Beck paternal grandfather died of colon cancer in his 53's and her paternal grandmother died in her 19's of Alzheimer's disease.    Jennifer Beck mother was diagnosed with kidney cancer at 29 and is now 61.  She reports that her mother grew [place] and believes the well water was contaminated with chemicals from the farming.   Ms.  Beck has a maternal aunt who was diagnosed with cervical cancer in her 38's/40's and had no children.  Jennifer Beck has a maternal uncle who died in his late 72's of diabetes related complications. He had a daughter and 2 sons.  The daughter (pateint's cousin) had breast cancer at 7 and had genetic testing.  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  (Copy was made of this test report in genetics S drive).  Ms.  [place] maternal grandfather died of a heart attack in his 58's and her maternal grandmother was diagnosed with colon cancer at 30.  This recurred at 18.  She had a sister (patient;s great aunt) with breast cancer diagnosed in her 7's.    [name] maternal ancestors are of Caucasian descent, and paternal ancestors are of Caucasian descent. There is no reported Ashkenazi Jewish ancestry. There is no known consanguinity.  GENETIC COUNSELING ASSESSMENT: VENDETTA PITTINGER is a 51 y.o. female with a personal and family which is somewhat suggestive of a Hereditary Cancer Predisposition Syndrome. There is also a known familial CHEK2 mutation in her maternal cousin.  We, therefore, discussed and recommended the following at today's visit.   DISCUSSION: We reviewed the characteristics, features and inheritance patterns of hereditary cancer syndromes. We also discussed genetic testing, including the appropriate family members to test, the process of testing, insurance coverage and turn-around-time for results. We discussed the implications of a negative, positive and/or variant of uncertain significant result. We recommended Ms. Dotts pursue genetic testing for the Common Hereditary Cancer gene panel + Renal/Urinary Tract panel.   The Hereditary Gene Panel offered by [facility] includes sequencing and/or deletion duplication testing of the following 47 genes: APC, ATM, AXIN2, BARD1, BMPR1A, BRCA1, BRCA2, BRIP1, CDH1, CDKN2A (p14ARF), CDKN2A (p16INK4a), CKD4, CHEK2, CTNNA1, DICER1, EPCAM (Deletion/duplication  testing only), GREM1 (promoter region deletion/duplication testing only), KIT, MEN1, MLH1, MSH2, MSH3, MSH6, MUTYH, NBN, NF1, NHTL1, PALB2, PDGFRA, PMS2, POLD1, POLE, PTEN, RAD50, RAD51C, RAD51D, SDHB, SDHC, SDHD, SMAD4, SMARCA4. STK11, TP53, TSC1, TSC2, and VHL.  The following genes were evaluated for sequence changes only: SDHA and HOXB13 c.251G>A variant only.  Renal /Urinary Tract Panel (24 genes): BAP1 CDC73 CDKN1C DICER1 DIS3L2 EPCAM FH FLCN GPC3 MET MLH1 MSH2 MSH6 PMS2 PTEN SDHB SDHC SMARCA4 SMARCB1 TP53 TSC1 TSC2 VHL WT1  BUB1B CEP57 MITF PALB2 SDHA SDHD   We discussed that only 5-10% of cancers are associated with a Hereditary cancer predisposition syndrome.  One of the most common hereditary cancer syndromes that increases breast cancer risk is called Hereditary Breast and Ovarian Cancer (HBOC) syndrome.  This syndrome is caused by mutations in the BRCA1 and BRCA2 genes.  This syndrome increases an individual's lifetime risk to develop breast, ovarian, pancreatic, and other types of cancer.  There are also many other cancer predisposition syndromes caused by mutations in several other genes.  We did discuss that CHEK2 is associated with an increased risk for breast, colon, and possibly other types of cancer (including kidney, thyroid and, prostate).  Her maternal family history of cancer could be explained by this mutation and does seem to fit the expected patterns for a CHEK2 positive family.  However, it is difficult to determine this from family history and genetic testing is the only way to get a definitive answer about her CHEK2 mutation status.   We discussed that if she is found to have a mutation in one of these genes, it may impact surgical decisions, and alter future medical management recommendations such as increased cancer screenings and consideration of risk reducing surgeries.  A positive result could also have implications for the patient's family members.  A Negative result  would mean we were unable to identify a hereditary component to her cancer, but does not rule out the possibility of a hereditary basis for her cancer.  There could be mutations that are undetectable by current technology, or in genes not yet tested or identified to increase cancer risk.  We discussed the potential to find a Variant of Uncertain Significance or VUS.  These are variants that have not yet been identified as pathogenic or benign, and it is unknown if this variant is associated with increased cancer risk or if this is a normal finding.  Most VUS's are reclassified to benign or likely benign.   It should not be used to make medical management decisions. With time, we suspect the lab will determine the significance of any VUS's identified if any.   Based on Ms. Bedford personal and family history of cancer, she meets medical criteria for genetic testing. Despite that she meets criteria, she may still have an out of pocket cost. We discussed that if her out of pocket cost for testing is over $100, the laboratory will call and confirm whether she wants to proceed with testing.  If the out of pocket cost of testing is less than $100 she will be billed by the genetic testing laboratory.   PLAN: After considering the risks, benefits, and limitations, Ms. Blitch  provided informed consent to pursue genetic testing and the blood sample was sent to [facility] for analysis of the Common Hereditary Cancer Panel + Renal/Urinary Tract Panel. Results should be available within approximately 2-3 weeks' time, at which point they will be disclosed by telephone to Ms. Exley, as will any additional recommendations warranted by these results. Ms. Brusseau will receive a summary of her genetic counseling visit and a copy of her results once available. This information will also be available in Epic. We encouraged Ms. Pasillas to remain in contact with cancer genetics annually so that we can continuously update the  family history and inform her of any changes in cancer genetics and testing that may be of benefit for her family. Ms. Schrum questions were answered to her satisfaction today. Our contact information was provided should additional questions or concerns arise.  Based on Ms. Bowlds's family history, we recommended her brother and all maternal relatives have genetic testing for the CHEK2 familial mutation.  Ms. Casler will let us know if we can be of any assistance in coordinating genetic counseling and/or testing for this family member.   Lastly, we encouraged Ms. Kolek to remain in contact with cancer genetics annually so that we can continuously update the family history and inform her of any changes in cancer genetics and testing that may be of benefit for this family.   Ms.  Elsbernd questions were answered to her satisfaction today. Our contact information was provided should additional questions or concerns arise. Thank you for the referral and allowing [place] to share in the care of your patient.   Tana Felts, MS Genetic Counselor [email or web address] phone: [phone]  The patient was seen for a total of 40 minutes in face-to-face genetic counseling. This patient was discussed with Drs. Magrinat, Lindi Adie and/or Burr Medico who agrees with the above.

## 2017-04-25 ENCOUNTER — Telehealth: Payer: Self-pay | Admitting: Oncology

## 2017-04-25 ENCOUNTER — Other Ambulatory Visit: Payer: 59

## 2017-04-25 ENCOUNTER — Ambulatory Visit (HOSPITAL_BASED_OUTPATIENT_CLINIC_OR_DEPARTMENT_OTHER): Payer: 59 | Admitting: Oncology

## 2017-04-25 ENCOUNTER — Ambulatory Visit (HOSPITAL_BASED_OUTPATIENT_CLINIC_OR_DEPARTMENT_OTHER): Payer: 59

## 2017-04-25 ENCOUNTER — Ambulatory Visit: Payer: 59

## 2017-04-25 ENCOUNTER — Other Ambulatory Visit (HOSPITAL_BASED_OUTPATIENT_CLINIC_OR_DEPARTMENT_OTHER): Payer: 59

## 2017-04-25 VITALS — BP 129/73 | HR 87 | Temp 98.4°F | Resp 20 | Ht 65.0 in | Wt 161.7 lb

## 2017-04-25 DIAGNOSIS — Z171 Estrogen receptor negative status [ER-]: Principal | ICD-10-CM

## 2017-04-25 DIAGNOSIS — C50411 Malignant neoplasm of upper-outer quadrant of right female breast: Secondary | ICD-10-CM | POA: Diagnosis not present

## 2017-04-25 DIAGNOSIS — Z5111 Encounter for antineoplastic chemotherapy: Secondary | ICD-10-CM

## 2017-04-25 DIAGNOSIS — K219 Gastro-esophageal reflux disease without esophagitis: Secondary | ICD-10-CM

## 2017-04-25 DIAGNOSIS — K2970 Gastritis, unspecified, without bleeding: Secondary | ICD-10-CM

## 2017-04-25 LAB — CBC WITH DIFFERENTIAL/PLATELET
BASO%: 0.7 % (ref 0.0–2.0)
BASOS ABS: 0.1 10*3/uL (ref 0.0–0.1)
EOS ABS: 0.2 10*3/uL (ref 0.0–0.5)
EOS%: 1.5 % (ref 0.0–7.0)
HCT: 35.8 % (ref 34.8–46.6)
HEMOGLOBIN: 12.3 g/dL (ref 11.6–15.9)
LYMPH%: 15.2 % (ref 14.0–49.7)
MCH: 33 pg (ref 25.1–34.0)
MCHC: 34.4 g/dL (ref 31.5–36.0)
MCV: 95.7 fL (ref 79.5–101.0)
MONO#: 0.7 10*3/uL (ref 0.1–0.9)
MONO%: 5.6 % (ref 0.0–14.0)
NEUT#: 9.1 10*3/uL — ABNORMAL HIGH (ref 1.5–6.5)
NEUT%: 77 % — AB (ref 38.4–76.8)
Platelets: 285 10*3/uL (ref 145–400)
RBC: 3.74 10*6/uL (ref 3.70–5.45)
RDW: 12.2 % (ref 11.2–14.5)
WBC: 11.8 10*3/uL — ABNORMAL HIGH (ref 3.9–10.3)
lymph#: 1.8 10*3/uL (ref 0.9–3.3)

## 2017-04-25 LAB — COMPREHENSIVE METABOLIC PANEL
ALT: 14 U/L (ref 0–55)
AST: 14 U/L (ref 5–34)
Albumin: 3.8 g/dL (ref 3.5–5.0)
Alkaline Phosphatase: 100 U/L (ref 40–150)
Anion Gap: 8 mEq/L (ref 3–11)
BUN: 7.8 mg/dL (ref 7.0–26.0)
CHLORIDE: 107 meq/L (ref 98–109)
CO2: 23 meq/L (ref 22–29)
CREATININE: 0.7 mg/dL (ref 0.6–1.1)
Calcium: 8.8 mg/dL (ref 8.4–10.4)
EGFR: >60 mL/min/{1.73_m2} (ref >60)
Glucose: 110 mg/dl (ref 70–140)
Potassium: 3.9 mEq/L (ref 3.5–5.1)
Sodium: 137 mEq/L (ref 136–145)
TOTAL PROTEIN: 6.9 g/dL (ref 6.4–8.3)
Total Bilirubin: <0.22 mg/dL (ref 0.20–1.20)

## 2017-04-25 MED ORDER — HEPARIN SOD (PORK) LOCK FLUSH 100 UNIT/ML IV SOLN
500.0000 [IU] | Freq: Once | INTRAVENOUS | Status: AC | PRN
  Administered 2017-04-25: 500 [IU]
  Filled 2017-04-25: qty 5

## 2017-04-25 MED ORDER — PALONOSETRON HCL INJECTION 0.25 MG/5ML
0.2500 mg | Freq: Once | INTRAVENOUS | Status: AC
  Administered 2017-04-25: 0.25 mg via INTRAVENOUS

## 2017-04-25 MED ORDER — DOXORUBICIN HCL CHEMO IV INJECTION 2 MG/ML
60.0000 mg/m2 | Freq: Once | INTRAVENOUS | Status: AC
  Administered 2017-04-25: 110 mg via INTRAVENOUS
  Filled 2017-04-25: qty 55

## 2017-04-25 MED ORDER — PALONOSETRON HCL INJECTION 0.25 MG/5ML
INTRAVENOUS | Status: AC
  Filled 2017-04-25: qty 5

## 2017-04-25 MED ORDER — SODIUM CHLORIDE 0.9 % IV SOLN
600.0000 mg/m2 | Freq: Once | INTRAVENOUS | Status: AC
  Administered 2017-04-25: 1100 mg via INTRAVENOUS
  Filled 2017-04-25: qty 55

## 2017-04-25 MED ORDER — SODIUM CHLORIDE 0.9% FLUSH
10.0000 mL | Freq: Once | INTRAVENOUS | Status: AC
  Administered 2017-04-25: 10 mL
  Filled 2017-04-25: qty 10

## 2017-04-25 MED ORDER — SODIUM CHLORIDE 0.9 % IV SOLN
Freq: Once | INTRAVENOUS | Status: AC
  Administered 2017-04-25: 15:00:00 via INTRAVENOUS
  Filled 2017-04-25: qty 5

## 2017-04-25 MED ORDER — SODIUM CHLORIDE 0.9 % IV SOLN
Freq: Once | INTRAVENOUS | Status: AC
  Administered 2017-04-25: 14:00:00 via INTRAVENOUS

## 2017-04-25 MED ORDER — SODIUM CHLORIDE 0.9% FLUSH
10.0000 mL | INTRAVENOUS | Status: DC | PRN
  Administered 2017-04-25: 10 mL
  Filled 2017-04-25: qty 10

## 2017-04-25 NOTE — Telephone Encounter (Signed)
Gave patient calendars with appts per 11/29 los

## 2017-04-25 NOTE — Patient Instructions (Signed)
[facility] Discharge Instructions for Patients Receiving Chemotherapy  Today you received the following chemotherapy agents Doxorubicin, Cytoxan.   To help prevent nausea and vomiting after your treatment, we encourage you to take your nausea medication as prescribed.   If you develop nausea and vomiting that is not controlled by your nausea medication, call the clinic.   BELOW ARE SYMPTOMS THAT SHOULD BE REPORTED IMMEDIATELY:  *FEVER GREATER THAN 100.5 F  *CHILLS WITH OR WITHOUT FEVER  NAUSEA AND VOMITING THAT IS NOT CONTROLLED WITH YOUR NAUSEA MEDICATION  *UNUSUAL SHORTNESS OF BREATH  *UNUSUAL BRUISING OR BLEEDING  TENDERNESS IN MOUTH AND THROAT WITH OR WITHOUT PRESENCE OF ULCERS  *URINARY PROBLEMS  *BOWEL PROBLEMS  UNUSUAL RASH Items with * indicate a potential emergency and should be followed up as soon as possible.  Feel free to call the clinic should you have any questions or concerns. The clinic phone number is [phone].  Please show the [place] at check-in to the Emergency Department and triage nurse.

## 2017-04-25 NOTE — Progress Notes (Signed)
[place]  [phone] [phone]     ID: Jennifer Beck DOB: [birth year]  MR#: [id number]  NOM#:[id number]  Patient Care Team: [name], MD as PCP - General (Internal Medicine) [name], MD as Consulting Physician (Dermatology) [name], DC as Referring Physician (Chiropractic Medicine) [name], MD as Consulting Physician (General Surgery) [name], MD as Attending Physician (Radiation Oncology) [name], MD as Consulting Physician (Obstetrics and Gynecology) OTHER MD:  CHIEF COMPLAINT: Triple negative breast cancer  CURRENT TREATMENT: Neoadjuvant chemotherapy   HISTORY OF CURRENT ILLNESS:  From the original intake note:  Jennifer Beck palpated a mass in her right breast sometime in September 2018.[name] She brought this to medical attention and her PCP, [name], set her up on 03/18/2017 for a unilateral right diagnostic mammography with ultrasonography, showing: Breast density D. The palpable right breast mass at 12:30 was indeterminate, and biopsy was warranted at that time. No evidence of right axillary lymphadenopathy was noted. Biopsy of the lesion in question on 03/21/2017 at the right breast 12:30 position showed ([id number])  invasive ductal carcinoma with lymphovascular invasion.  The tumor was grade 3, triple negative, with an MIB-1 of 80%.  She is accompanied by her husband, Jennifer Beck to the office today. She reports that she is doing well overall. She initially felt a lump in September in the shower and notes that she doesn't complete monthly self breast exams. She had a routine mammogram in March 2018 at [facility] and she is followed by Dr. [name].  As far as surgeries, she has had two laparoscopic procedures for endometriosis as well as a bilateral tubal ligation. She still has occasional cramping, painful ovulation, and vaginal spotting that comes and goes. She has had cyst  to her breast before that have been evaluated and ruled as negative. She notes that she still has her tonsils, gallbladder, and appendix. She has a prior hx of pericarditis in 2003 that she notes was brought onby a virus. She has since been cleared by her prior Cardiologist.   She has a Restaurant manager, fast food, [name] that she sees every 5 weeks for maintenance of her neck and back issues. She has a dermatologist, [name] at [facility] and she has had an biopsy of an area from her left chest that resulted as basal cell carcinoma in 2015. She denies having a GI specialist, Cardiologist, or Orthopedist at this time. She denies prior history of seizures, migraines, acid reflux, asthma, emphysema, palpitations or GI issues.    [name] is as detailed below.  INTERVAL HISTORY: Jennifer Beck returns today for follow-up and treatment of her triple negative breast cancer. Accompanied by her husband and mother. Today is day 1 cycle 2 of 4 planned cycles of cyclophosphamide and doxorubicin given every 14 days, to be followed by taxanes.  REVIEW OF SYSTEMS:  Jennifer Beck is doing well. She has been experiencing heartburn and after talking with Val, she has been taking Pepcid with relief. She has experienced mild hiccups and states she was constipated the first few days and used stool softener for relief. Her energy levels are good. She did feel mild fatigue after her last dose of Neulasta. She took Claritin an ibuprofen for relief. She is still working and she has not started loosing her hair. She denies unusual headaches, visual changes, nausea, vomiting, or dizziness. There has been no unusual cough, phlegm production, or pleurisy. This been no change in bladder habits.  She denies unexplained weight loss, bleeding, rash, or fever. A detailed review of systems was otherwise entirely stable.     PAST MEDICAL HISTORY: Past Medical History:  Diagnosis Date  . Chest pain   .  Complication of anesthesia   . Endometriosis   . Family history of breast cancer 04/22/2017  . Family history of colon cancer 04/22/2017  . Family history of kidney cancer 04/22/2017  . Fatigue   . GERD (gastroesophageal reflux disease)   . GI bleed    caused by ischemic colitis following an episode of hypertension  . Heart palpitations   . Hx of echocardiogram    a. echo 9/13:  EF 55-60%, Gr 2 diast dysfn  . Hypercholesterolemia   . Ischemic colitis ([place])   . 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 Myocarditis ([place])   . Pericarditis   . PONV (postoperative nausea and vomiting)   . SOB (shortness of breath)     PAST SURGICAL HISTORY: Past Surgical History:  Procedure Laterality Date  . CARDIAC CATHETERIZATION  08/26/2001   EF of 60% --- smooth & normal coronary arteries -- there is a Moates motion defect consistent with posterolateral MI -- she quite possibly had a coronary spasm -- she may have some pericarditis secondary to her MI   . DILATION AND CURETTAGE OF UTERUS  10/15/2003   Uterine enlargement, menorrhagia  . DILATION AND CURETTAGE OF UTERUS  05/27/2002   Dysfunctional uterine bleeding  . LAPAROSCOPY    . NOVASURE ABLATION  10/15/2003   for management of her extreme menorrhagi  . PORTACATH PLACEMENT Right 04/02/2017   Procedure: INSERTION PORT-A-CATH;  Surgeon: [name], MD;  Location: [facility];  Service: General;  Laterality: Right;  . TUBAL LIGATION  09/2000   bilateral    FAMILY HISTORY Family History  Problem Relation Age of Onset  . Hypertension Father   . Diabetes Father   . Other Father        stomach mass suspicious for ca, never bx  . Coronary artery disease Mother   . Kidney cancer Mother 56  . Coronary artery disease Maternal Grandfather   . Colon cancer Maternal Grandfather 61       recurrence  . Coronary artery disease Maternal Uncle   . Coronary artery disease Maternal Uncle   .  Diabetes Maternal Uncle   . Colon cancer Maternal Grandmother 3  . Breast cancer Cousin 61       had GT- CHEK2+  . Cervical cancer Maternal Aunt 35  . Diabetes Maternal Aunt   . Alzheimer's disease Paternal Grandmother   . Colon cancer Paternal Grandfather 7  . Breast cancer Paternal Aunt   . Breast cancer Paternal Aunt   . Breast cancer Other 98   Her father died last 2016-02-10 at age 14 just 36 week shy of his 83rd birthday. Her mother is 79 years old as of October 2018. Patient has one brother and no sisters. Her mother was diagnosed with kidney cancer at age 48 with a nephrectomy following. Her maternal grandmother was diagnosed with colon cancer at age 61.  The patient paternal grandfather was diagnosed with colon cancer in his 46's. She has paternal cousins through her fathers half-sisters that have been diagnosed with breast cancer, she is unsure of the number of breast cancer occurrences due to the distance . A maternal cousin was diagnosed with breast cancer at age 75 and she  had genetic testing. The patient does have a copy of this testing as well as her PCP.  This was not available for review on the nasal  GYNECOLOGIC HISTORY:  No LMP recorded. Patient has had an ablation.   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 Her husband Jennifer Beck is a Airline pilot. Her daughter [name] is 80 and currently a sophomore at [employer]. 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  The patient attends [employer]. She lives in [place].      ADVANCED DIRECTIVES:    HEALTH MAINTENANCE: Social History   Tobacco Use  . Smoking status: Never Smoker  . Smokeless tobacco: Never Used  Substance Use  Topics  . Alcohol use: Yes    Comment: Occasionally drinks wine  . Drug use: No     Colonoscopy: Yes, 2003  PAP: March 2018   Bone density: N/A   Allergies  Allergen Reactions  . Codeine Itching  . Biaxin [Clarithromycin]     Heart Burn    Current Outpatient Medications  Medication Sig Dispense Refill  . acetaminophen (TYLENOL) 325 MG tablet Take 650 mg by mouth every 6 (six) hours as needed.    . ALPRAZolam (XANAX) 0.25 MG tablet Take 0.25 mg by mouth daily as needed for anxiety.    . Cholecalciferol (VITAMIN D) 2000 units CAPS Take 2,000 Units by mouth daily.    [name] dexamethasone (DECADRON) 4 MG tablet Take 2 tablets by mouth once a day on the day after chemotherapy and then take 2 tablets two times a day for 2 days. Take with food. 30 tablet 1  . Evening Primrose Oil 1000 MG CAPS Take 1 capsule by mouth every evening.     . famotidine (PEPCID) 10 MG tablet Take 10 mg by mouth 2 (two) times daily.    . fluocinonide cream (LIDEX) 2.29 % Apply 1 application topically 2 (two) times daily as needed.     [name] glucosamine-chondroitin 500-400 MG tablet Take 1 tablet by mouth daily.     [name] HYDROcodone-acetaminophen (NORCO/VICODIN) 5-325 MG tablet Take 1-2 tablets every 6 (six) hours as needed by mouth for moderate pain. 15 tablet 0  . ibuprofen (ADVIL,MOTRIN) 200 MG tablet Take 400 mg by mouth every 8 (eight) hours as needed for headache or mild pain.     [name] lidocaine-prilocaine (EMLA) cream Apply to affected area once 30 g 3  . LORazepam (ATIVAN) 0.5 MG tablet Take 1 tablet (0.5 mg total) by mouth at bedtime as needed (Nausea or vomiting). 30 tablet 0  . Melatonin 5 MG TABS Take 5 mg by mouth at bedtime as needed (sleep).     . Multiple Minerals-Vitamins (CALCIUM-MAGNESIUM-ZINC) TABS Take 1 tablet by mouth daily.     . Multiple Vitamins-Minerals (MULTIVITAMINS THER. W/MINERALS) TABS Take 1 tablet by mouth daily.     . Omega-3 Fatty Acids (FISH OIL) 1000 MG CAPS Take 1,000 mg by mouth 2 (two)  times a week.     Vladimir Faster Glycol-Propyl Glycol (SYSTANE OP) Apply 1 drop to eye 2 (two) times daily.    . prochlorperazine (COMPAZINE) 10 MG tablet Take 1 tablet (10 mg total) by mouth every 6 (six)  hours as needed (Nausea or vomiting). 30 tablet 1  . VITAMIN E PO Take 1 tablet by mouth daily.      No current facility-administered medications for this visit.     OBJECTIVE: Middle-aged white woman in no acute distress  Vitals:   04/25/17 1215  BP: 129/73  Pulse: 87  Resp: 20  Temp: 98.4 F (36.9 C)  SpO2: 100%     Body mass index is 26.91 kg/m.   Wt Readings from Last 3 Encounters:  04/25/17 161 lb 11.2 oz (73.3 kg)  04/17/17 161 lb (73 kg)  04/09/17 161 lb 14.4 oz (73.4 kg)      ECOG FS:1  Sclerae unicteric, pupils round and equal Oropharynx clear and moist No cervical or supraclavicular adenopathy Lungs no rales or rhonchi Heart regular rate and rhythm Abd soft, nontender, positive bowel sounds MSK no focal spinal tenderness, no upper extremity lymphedema Neuro: nonfocal, well oriented, appropriate affect Breasts: The right breast has an ecchymosis in the inferior portion.  It is otherwise unremarkable.  The left breast has a significant hematoma in the superior anterior portion.  It is otherwise unremarkable.  Both axillae are benign    LAB RESULTS:  CMP     Component Value Date/Time   NA 137 04/25/2017 1106   K 3.9 04/25/2017 1106   CL 106 02/05/2012 1742   CO2 23 04/25/2017 1106   GLUCOSE 110 04/25/2017 1106   BUN 7.8 04/25/2017 1106   CREATININE 0.7 04/25/2017 1106   CALCIUM 8.8 04/25/2017 1106   PROT 6.9 04/25/2017 1106   ALBUMIN 3.8 04/25/2017 1106   AST 14 04/25/2017 1106   ALT 14 04/25/2017 1106   ALKPHOS 100 04/25/2017 1106   BILITOT <0.22 04/25/2017 1106    No results found for: TOTALPROTELP, ALBUMINELP, A1GS, A2GS, BETS, BETA2SER, GAMS, MSPIKE, SPEI  No results found for: KPAFRELGTCHN, LAMBDASER, KAPLAMBRATIO  Lab Results  Component  Value Date   WBC 11.8 (H) 04/25/2017   NEUTROABS 9.1 (H) 04/25/2017   HGB 12.3 04/25/2017   HCT 35.8 04/25/2017   MCV 95.7 04/25/2017   PLT 285 04/25/2017    '@LASTCHEMISTRY' @  No results found for: LABCA2  No components found for: [id number]  No results for input(s): INR in the last 168 hours.  No results found for: LABCA2  No results found for: FTD322  No results found for: GUR427  No results found for: CWC376  No results found for: CA2729  No components found for: HGQUANT  No results found for: CEA1 / No results found for: CEA1   No results found for: AFPTUMOR  No results found for: [place]  No results found for: PSA1  Appointment on 04/25/2017  Component Date Value Ref Range Status  . Sodium 04/25/2017 137  136 - 145 mEq/L Final  . Potassium 04/25/2017 3.9  3.5 - 5.1 mEq/L Final  . Chloride 04/25/2017 107  98 - 109 mEq/L Final  . CO2 04/25/2017 23  22 - 29 mEq/L Final  . Glucose 04/25/2017 110  70 - 140 mg/dl Final   Glucose reference range is for nonfasting patients. Fasting glucose reference range is 70- 100.  [name] BUN 04/25/2017 7.8  7.0 - 26.0 mg/dL Final  . Creatinine 04/25/2017 0.7  0.6 - 1.1 mg/dL Final  . Total Bilirubin 04/25/2017 <0.22  0.20 - 1.20 mg/dL Final  . Alkaline Phosphatase 04/25/2017 100  40 - 150 U/L Final  . AST 04/25/2017 14  5 - 34 U/L Final  . ALT 04/25/2017  14  0 - 55 U/L Final  . Total Protein 04/25/2017 6.9  6.4 - 8.3 g/dL Final  . Albumin 04/25/2017 3.8  3.5 - 5.0 g/dL Final  . Calcium 04/25/2017 8.8  8.4 - 10.4 mg/dL Final  . Anion Gap 04/25/2017 8  3 - 11 mEq/L Final  . EGFR 04/25/2017 >60  >60 ml/min/1.73 m2 Final   eGFR is calculated using the CKD-EPI Creatinine Equation (2009)  . WBC 04/25/2017 11.8* 3.9 - 10.3 10e3/uL Final  . NEUT# 04/25/2017 9.1* 1.5 - 6.5 10e3/uL Final  . HGB 04/25/2017 12.3  11.6 - 15.9 g/dL Final  . HCT 04/25/2017 35.8  34.8 - 46.6 % Final  . Platelets 04/25/2017 285  145 - 400 10e3/uL Final  .  MCV 04/25/2017 95.7  79.5 - 101.0 fL Final  . MCH 04/25/2017 33.0  25.1 - 34.0 pg Final  . MCHC 04/25/2017 34.4  31.5 - 36.0 g/dL Final  . RBC 04/25/2017 3.74  3.70 - 5.45 10e6/uL Final  . RDW 04/25/2017 12.2  11.2 - 14.5 % Final  . lymph# 04/25/2017 1.8  0.9 - 3.3 10e3/uL Final  . MONO# 04/25/2017 0.7  0.1 - 0.9 10e3/uL Final  . Eosinophils Absolute 04/25/2017 0.2  0.0 - 0.5 10e3/uL Final  . Basophils Absolute 04/25/2017 0.1  0.0 - 0.1 10e3/uL Final  . NEUT% 04/25/2017 77.0* 38.4 - 76.8 % Final  . LYMPH% 04/25/2017 15.2  14.0 - 49.7 % Final  . MONO% 04/25/2017 5.6  0.0 - 14.0 % Final  . EOS% 04/25/2017 1.5  0.0 - 7.0 % Final  . BASO% 04/25/2017 0.7  0.0 - 2.0 % Final    (this displays the last labs from the last 3 days)  No results found for: TOTALPROTELP, ALBUMINELP, A1GS, A2GS, BETS, BETA2SER, GAMS, MSPIKE, SPEI (this displays SPEP labs)  No results found for: KPAFRELGTCHN, LAMBDASER, KAPLAMBRATIO (kappa/lambda light chains)  No results found for: HGBA, HGBA2QUANT, HGBFQUANT, HGBSQUAN (Hemoglobinopathy evaluation)   No results found for: LDH  No results found for: IRON, TIBC, IRONPCTSAT (Iron and TIBC)  No results found for: FERRITIN  Urinalysis No results found for: COLORURINE, APPEARANCEUR, LABSPEC, PHURINE, GLUCOSEU, HGBUR, BILIRUBINUR, KETONESUR, PROTEINUR, UROBILINOGEN, NITRITE, LEUKOCYTESUR   STUDIES: Mr Breast Bilateral W Wo Contrast  Result Date: 03/29/2017 CLINICAL DATA:  51 year old with recent biopsy-proven triple negative invasive ductal carcinoma with lymphovascular invasion involving the upper inner quadrant of the right breast, presenting for evaluation prior to neoadjuvant chemotherapy. LABS:  Not applicable. EXAM: BILATERAL BREAST MRI WITH AND WITHOUT CONTRAST TECHNIQUE: Multiplanar, multisequence MR images of both breasts were obtained prior to and following the intravenous administration of 15 ml of MultiHance. THREE-DIMENSIONAL MR IMAGE RENDERING ON  INDEPENDENT WORKSTATION: Three-dimensional MR images were rendered by post-processing of the original MR data on an independent workstation. The three-dimensional MR images were interpreted, and findings are reported in the following complete MRI report for this study. Three dimensional images were evaluated at the independent DynaCad workstation. COMPARISON:  No prior MRI. Mammography 03/21/2017 (right), 03/18/2017 (right), 08/15/2016 (bilateral) and earlier. Right breast ultrasound 03/21/2017, 03/18/2017. FINDINGS: All measurements will be given as:  AP X WIDTH X CRANIOCAUDAL. Breast composition: d. Extreme fibroglandular tissue. Background parenchymal enhancement: Extensive. Right breast: Enhancing mass involving the upper inner quadrant measuring approximately 1.8 x 1.7 x 2.5 cm, within which is blooming artifact from the biopsy marker clip, demonstrating plateau and washout kinetics, corresponding to the biopsy-proven malignancy. Clumped non mass enhancement involving the lower outer quadrant at anterior depth  measuring approximately 3.2 x 2.0 x 2.5 cm, demonstrating plateau and washout kinetics (series 10601, images 161-173). Linear non mass enhancement involving the lower outer quadrant at posterior depth measuring approximately 1.7 x 0.5 x 0.8 cm, demonstrating plateau kinetics (series 10601, images 143-149). In total, the non mass enhancement in the lower outer quadrant spans approximately 7 cm. Non mass enhancement involving the outer right breast at posterior depth, approximate 9 o'clock location, measuring approximately 0.9 x 0.5 x 0.8 cm, demonstrating benign progressive kinetics (series 10601, images 106-113). Left breast: Linear non mass enhancement involving the upper outer quadrant at middle depth measuring approximately 1.2 x 0.3 x 0.5 cm, demonstrating washout kinetics (series 10601, images 91-101). Enhancing mass involving the upper inner quadrant at anterior to middle depth, at the approximate  12:30 o'clock position, measuring 0.8 x 0.3 x 0.6 cm, demonstrating washout kinetics (series 10601, images 65-71). Non mass enhancement involving the upper outer quadrant at middle depth measuring 1.5 x 1.0 x 0.8 cm, demonstrating benign progressive kinetics (series 10601, images 73- 82). Lymph nodes: No pathologic lymphadenopathy. Ancillary findings:  None. IMPRESSION: 1. Biopsy-proven malignancy involving the upper inner quadrant of the right breast measuring approximately 1.8 x 1.7 x 2.5 cm. 2. 2 indeterminate foci of non mass enhancement involving the lower outer quadrant of the right breast at anterior and posterior depth. Measurements are given above. The overall span of non mass enhancement in the lower outer quadrant measures approximately 7 cm. 3. Indeterminate focus of non mass enhancement involving the upper outer quadrant of the left breast at middle depth. Measurements given above. 4. Indeterminate mass (maximum measurement 0.8 cm) involving the upper inner quadrant of the left breast at anterior to middle depth at the approximate 12:30 o'clock position. 5. Non mass enhancement involving the outer right breast at posterior depth at the approximate 9 o'clock position and in the upper outer quadrant of the left breast at middle depth which demonstrate benign enhancement kinetics and therefore likely represent functional breast tissue. 6. No pathologic lymphadenopathy. (Extreme background parenchymal enhancement lowers the specificity of MRI, therefore, any of these areas of non mass enhancement may represent functioning breast tissue. RECOMMENDATION: 1. MRI biopsy of the 2 indeterminate foci of non mass enhancement involving the lower outer quadrant of the right breast at both anterior and posterior depth (if persistent). 2. MRI biopsy of the indeterminate focus of non mass enhancement involving the upper outer quadrant of the left breast and the indeterminate mass involving the upper inner quadrant of  the left breast (if persistent). The MRI biopsy should be scheduled between day 7 and 14 of the patient's menstrual cycle in order to minimize background enhancement. BI-RADS CATEGORY  4: Suspicious. Electronically Signed   By: Evangeline Dakin M.D.   On: 03/29/2017 09:27   Dg Chest Port 1 View  Result Date: 04/02/2017 CLINICAL DATA:  Postop for Port-A-Cath placement.  Breast cancer. EXAM: PORTABLE CHEST 1 VIEW COMPARISON:  Breast MR 03/28/2017 FINDINGS: Right-sided Port-A-Cath terminates at the high SVC. Midline trachea. Normal heart size and mediastinal contours. No pleural effusion or pneumothorax. Clear lungs. IMPRESSION: Right-sided Port-A-Cath terminating at the high SVC; no pneumothorax. Electronically Signed   By: Abigail Miyamoto M.D.   On: 04/02/2017 11:43   Dg C-arm 1-60 Min-no Report  Result Date: 04/02/2017 Fluoroscopy was utilized by the requesting physician.  No radiographic interpretation.   Mm Clip Placement Left  Result Date: 04/11/2017 CLINICAL DATA:  Status post bilateral MRI guided breast biopsies, 2 sites of  non mass enhancement within each breast (4 total sites). Recently diagnosed right breast cancer. EXAM: DIAGNOSTIC BILATERAL MAMMOGRAM POST MRI BIOPSY COMPARISON:  Previous exam(s). FINDINGS: Mammographic images were obtained following MRI guided biopsy of 4 sites of abnormal non mass enhancement within the bilateral breast (2 sites for breast). All clips appear appropriately positioned. IMPRESSION: 1. Biopsy site 1: Cylinder shaped clip is appropriately positioned within the upper inner quadrant of the left breast. 2. Biopsy site 2: Barbell shaped clip is appropriately positioned within the upper outer quadrant of the left breast. 3. Biopsy site 3: Cylinder shaped clip is appropriately positioned within the lower inner quadrant of the right breast. 4. Biopsy site 4: Barbell shaped clip is appropriately position within the lower outer quadrant of the right breast. A ribbon shaped  clip within the right breast corresponds to the site of a recent biopsy-proven breast cancer. Final Assessment: Post Procedure Mammograms for Marker Placement Electronically Signed   By: Franki Cabot M.D.   On: 04/11/2017 11:24   Mm Clip Placement Right  Result Date: 04/11/2017 CLINICAL DATA:  Status post bilateral MRI guided breast biopsies, 2 sites of non mass enhancement within each breast (4 total sites). Recently diagnosed right breast cancer. EXAM: DIAGNOSTIC BILATERAL MAMMOGRAM POST MRI BIOPSY COMPARISON:  Previous exam(s). FINDINGS: Mammographic images were obtained following MRI guided biopsy of 4 sites of abnormal non mass enhancement within the bilateral breast (2 sites for breast). All clips appear appropriately positioned. IMPRESSION: 1. Biopsy site 1: Cylinder shaped clip is appropriately positioned within the upper inner quadrant of the left breast. 2. Biopsy site 2: Barbell shaped clip is appropriately positioned within the upper outer quadrant of the left breast. 3. Biopsy site 3: Cylinder shaped clip is appropriately positioned within the lower inner quadrant of the right breast. 4. Biopsy site 4: Barbell shaped clip is appropriately position within the lower outer quadrant of the right breast. A ribbon shaped clip within the right breast corresponds to the site of a recent biopsy-proven breast cancer. Final Assessment: Post Procedure Mammograms for Marker Placement Electronically Signed   By: Franki Cabot M.D.   On: 04/11/2017 11:24   Mr Aundra Millet Breast Bx W Loc Dev 1st Lesion Image Bx Spec Mr Guide  Addendum Date: 04/12/2017   ADDENDUM REPORT: 04/12/2017 10:58 ADDENDUM: PATHOLOGY: 1.  Left breast, upper inner quadrant - hemorrhage. 2.  Left breast, upper outer quadrant -ALH in sclerosing adenosis 3.  Right breast, lower inner quadrant - grade 2 IMC and MCIS 4. Right breast, lower outer quadrant- MCIS, sclerosing adenosis, FCC CONCORDANT: 1.  Discordant - surgical excision recommended 2.   Concordant - surgical excision recommended. 3.  Concordant 4.  Concordant I telephoned the patient on 04/12/2017 at 10 a.m. and discussed these results and the recommendations stated below. All questions were answered. The patient denies significant pain or bleeding from the biopsy site. Patient was instructed to contact the [place] if pain increased or bleeding reoccurred. RECOMMENDATION: Surgical excision for all biopsy sites if breast conservation is considered. Also, there is an additional focus of non mass enhancement within the upper left breast, 12 o'clock axis region, between the 2 biopsy sites, for which additional MRI guided biopsy is recommended if breast conservation is considered for the left breast. Findings and recommendations discussed with Dr. Lucia [place]. Electronically Signed   By: Franki Cabot M.D.   On: 04/12/2017 10:58   Result Date: 04/12/2017 CLINICAL DATA:  Recently diagnosed right triple negative breast cancer.  Subsequent breast MRI showing 2 suspicious sites of non mass enhancement within each breast (4 sites total) for which MRI guided biopsies were recommended. Patient presents today for the MRI guided biopsies. EXAM: MRI GUIDED CORE NEEDLE BIOPSY OF THE BILATERAL BREAST TECHNIQUE: Multiplanar, multisequence MR imaging of the bilateral breasts was performed both before and after administration of intravenous contrast. CONTRAST:  20m MULTIHANCE GADOBENATE DIMEGLUMINE 529 MG/ML IV SOLN COMPARISON:  Previous exams. FINDINGS: I met with the patient, and we discussed the procedure of MRI guided biopsy, including risks, benefits, and alternatives. Specifically, we discussed the risks of infection, bleeding, tissue injury, clip migration, and inadequate sampling. Informed, written consent was given. The usual time out protocol was performed immediately prior to the procedure. Left breast: Using sterile technique, 1% Lidocaine, MRI guidance, and a 9 gauge vacuum  assisted device, biopsy was performed of linear non mass enhancement within the upper inner quadrant of the left breast, at anterior depth, using a lateral approach. At the conclusion of the procedure, a cylinder shaped tissue marker clip was deployed into the biopsy cavity. Next, using sterile technique, 1% lidocaine, MRI guidance, and a 9 gauge vacuum assisted device, biopsy was performed of non mass enhancement within the upper-outer quadrant of the left breast, at middle depth, using a lateral approach. At the conclusion of the procedure, a barbell shaped tissue marker clip was deployed into the biopsy cavity. Right breast: Next, using sterile technique, 1% lidocaine, MRI guidance, and a 9 gauge vacuum assisted device, biopsy was performed of the focal non mass enhancement within the lower inner quadrant of the right breast, at anterior depth, using a lateral approach. At the conclusion of the procedure, a cylinder shaped tissue marker clip was deployed into the biopsy cavity. Next, using sterile technique, 1% lidocaine, MRI guidance, and a 9 gauge vacuum assisted device, biopsy was performed of the linear non mass enhancement within the lower outer quadrant of the right breast, at posterior depth, using a lateral approach. At the conclusion of the procedure, a barbell shaped tissue clip was deployed in the biopsy cavity. Follow-up 2-view mammogram was performed and dictated separately. IMPRESSION: MRI guided biopsy of 2 sites of abnormal non mass enhancement within each breast (4 total sites), as detailed above. No apparent complications. Electronically Signed: By: SFranki CabotM.D. On: 04/11/2017 11:10   Mr LAundra MilletBreast Bx W Loc Dev Ea Add Lesion Image Bx Spec Mr Guide  Addendum Date: 04/12/2017   ADDENDUM REPORT: 04/12/2017 10:58 ADDENDUM: PATHOLOGY: 1.  Left breast, upper inner quadrant - hemorrhage. 2.  Left breast, upper outer quadrant -ALH in sclerosing adenosis 3.  Right breast, lower inner quadrant -  grade 2 IMC and MCIS 4. Right breast, lower outer quadrant- MCIS, sclerosing adenosis, FCC CONCORDANT: 1.  Discordant - surgical excision recommended 2.  Concordant - surgical excision recommended. 3.  Concordant 4.  Concordant I telephoned the patient on 04/12/2017 at 10 a.m. and discussed these results and the recommendations stated below. All questions were answered. The patient denies significant pain or bleeding from the biopsy site. Patient [place]. RECOMMENDATION: Surgical excision for all biopsy sites if breast conservation is considered. Also, there is an additional focus of non mass enhancement within the upper left breast, 12 o'clock axis region, between the 2 biopsy sites, for which additional MRI guided biopsy is recommended if breast conservation is considered for the left breast. Findings and recommendations  discussed with Dr. Lucia Gaskins. Electronically Signed   By: Franki Cabot M.D.   On: 04/12/2017 10:58   Result Date: 04/12/2017 CLINICAL DATA:  Recently diagnosed right triple negative breast cancer. Subsequent breast MRI showing 2 suspicious sites of non mass enhancement within each breast (4 sites total) for which MRI guided biopsies were recommended. Patient presents today for the MRI guided biopsies. EXAM: MRI GUIDED CORE NEEDLE BIOPSY OF THE BILATERAL BREAST TECHNIQUE: Multiplanar, multisequence MR imaging of the bilateral breasts was performed both before and after administration of intravenous contrast. CONTRAST:  72m MULTIHANCE GADOBENATE DIMEGLUMINE 529 MG/ML IV SOLN COMPARISON:  Previous exams. FINDINGS: I met with the patient, and we discussed the procedure of MRI guided biopsy, including risks, benefits, and alternatives. Specifically, we discussed the risks of infection, bleeding, tissue injury, clip migration, and inadequate sampling. Informed, written consent was given. The usual time  out protocol was performed immediately prior to the procedure. Left breast: Using sterile technique, 1% Lidocaine, MRI guidance, and a 9 gauge vacuum assisted device, biopsy was performed of linear non mass enhancement within the upper inner quadrant of the left breast, at anterior depth, using a lateral approach. At the conclusion of the procedure, a cylinder shaped tissue marker clip was deployed into the biopsy cavity. Next, using sterile technique, 1% lidocaine, MRI guidance, and a 9 gauge vacuum assisted device, biopsy was performed of non mass enhancement within the upper-outer quadrant of the left breast, at middle depth, using a lateral approach. At the conclusion of the procedure, a barbell shaped tissue marker clip was deployed into the biopsy cavity. Right breast: Next, using sterile technique, 1% lidocaine, MRI guidance, and a 9 gauge vacuum assisted device, biopsy was performed of the focal non mass enhancement within the lower inner quadrant of the right breast, at anterior depth, using a lateral approach. At the conclusion of the procedure, a cylinder shaped tissue marker clip was deployed into the biopsy cavity. Next, using sterile technique, 1% lidocaine, MRI guidance, and a 9 gauge vacuum assisted device, biopsy was performed of the linear non mass enhancement within the lower outer quadrant of the right breast, at posterior depth, using a lateral approach. At the conclusion of the procedure, a barbell shaped tissue clip was deployed in the biopsy cavity. Follow-up 2-view mammogram was performed and dictated separately. IMPRESSION: MRI guided biopsy of 2 sites of abnormal non mass enhancement within each breast (4 total sites), as detailed above. No apparent complications. Electronically Signed: By: SFranki CabotM.D. On: 04/11/2017 11:10   Mr RRick DuffBreast Bx W Loc Dev 1st Lesion Image Bx Spec Mr Guide  Addendum Date: 04/12/2017   ADDENDUM REPORT: 04/12/2017 10:58 ADDENDUM: PATHOLOGY: 1.  Left  breast, upper inner quadrant - hemorrhage. 2.  Left breast, upper outer quadrant -ALH in sclerosing adenosis 3.  Right breast, lower inner quadrant - grade 2 IMC and MCIS 4. Right breast, lower outer quadrant- MCIS, sclerosing adenosis, FCC CONCORDANT: 1.  Discordant - surgical excision recommended 2.  Concordant - surgical excision recommended. 3.  Concordant 4.  Concordant I telephoned the patient on 04/12/2017 at 10 a.m. and discussed these results and the recommendations stated below. All questions were answered. The patient denies significant pain or bleeding from the biopsy site. Patient was instructed to contact the [place] increased or bleeding reoccurred. RECOMMENDATION: Surgical excision for all biopsy sites if breast conservation is considered. Also, there is an additional focus of non mass enhancement within the  upper left breast, 12 o'clock axis region, between the 2 biopsy sites, for which additional MRI guided biopsy is recommended if breast conservation is considered for the left breast. Findings and recommendations discussed with Dr. Lucia Gaskins. Electronically Signed   By: Franki Cabot M.D.   On: 04/12/2017 10:58   Result Date: 04/12/2017 CLINICAL DATA:  Recently diagnosed right triple negative breast cancer. Subsequent breast MRI showing 2 suspicious sites of non mass enhancement within each breast (4 sites total) for which MRI guided biopsies were recommended. Patient presents today for the MRI guided biopsies. EXAM: MRI GUIDED CORE NEEDLE BIOPSY OF THE BILATERAL BREAST TECHNIQUE: Multiplanar, multisequence MR imaging of the bilateral breasts was performed both before and after administration of intravenous contrast. CONTRAST:  29m MULTIHANCE GADOBENATE DIMEGLUMINE 529 MG/ML IV SOLN COMPARISON:  Previous exams. FINDINGS: I met with the patient, and we discussed the procedure of MRI guided biopsy, including risks, benefits, and alternatives. Specifically, we  discussed the risks of infection, bleeding, tissue injury, clip migration, and inadequate sampling. Informed, written consent was given. The usual time out protocol was performed immediately prior to the procedure. Left breast: Using sterile technique, 1% Lidocaine, MRI guidance, and a 9 gauge vacuum assisted device, biopsy was performed of linear non mass enhancement within the upper inner quadrant of the left breast, at anterior depth, using a lateral approach. At the conclusion of the procedure, a cylinder shaped tissue marker clip was deployed into the biopsy cavity. Next, using sterile technique, 1% lidocaine, MRI guidance, and a 9 gauge vacuum assisted device, biopsy was performed of non mass enhancement within the upper-outer quadrant of the left breast, at middle depth, using a lateral approach. At the conclusion of the procedure, a barbell shaped tissue marker clip was deployed into the biopsy cavity. Right breast: Next, using sterile technique, 1% lidocaine, MRI guidance, and a 9 gauge vacuum assisted device, biopsy was performed of the focal non mass enhancement within the lower inner quadrant of the right breast, at anterior depth, using a lateral approach. At the conclusion of the procedure, a cylinder shaped tissue marker clip was deployed into the biopsy cavity. Next, using sterile technique, 1% lidocaine, MRI guidance, and a 9 gauge vacuum assisted device, biopsy was performed of the linear non mass enhancement within the lower outer quadrant of the right breast, at posterior depth, using a lateral approach. At the conclusion of the procedure, a barbell shaped tissue clip was deployed in the biopsy cavity. Follow-up 2-view mammogram was performed and dictated separately. IMPRESSION: MRI guided biopsy of 2 sites of abnormal non mass enhancement within each breast (4 total sites), as detailed above. No apparent complications. Electronically Signed: By: SFranki CabotM.D. On: 04/11/2017 11:10   Mr Rt  Breast Bx W Loc Dev Ea Add Lesion Image Bx Spec Mr Guide  Addendum Date: 04/12/2017   ADDENDUM REPORT: 04/12/2017 10:58 ADDENDUM: PATHOLOGY: 1.  Left breast, upper inner quadrant - hemorrhage. 2.  Left breast, upper outer quadrant -ALH in sclerosing adenosis 3.  Right breast, lower inner quadrant - grade 2 IMC and MCIS 4. Right breast, lower outer quadrant- MCIS, sclerosing adenosis, FCC CONCORDANT: 1.  Discordant - surgical excision recommended 2.  Concordant - surgical excision recommended. 3.  Concordant 4.  Concordant I telephoned the patient on 04/12/2017 at 10 a.m. and discussed these results and the recommendations stated below. All questions were answered. The patient denies significant pain or bleeding from the biopsy site. Patient was instructed to contact [facility] [place] [facility] if pain increased or bleeding reoccurred. RECOMMENDATION: Surgical excision for all biopsy sites if breast conservation is considered. Also, there is an additional focus of non mass enhancement within the upper left breast, 12 o'clock axis region, between the 2 biopsy sites, for which additional MRI guided biopsy is recommended if breast conservation is considered for the left breast. Findings and recommendations discussed with Dr. Lucia Gaskins. Electronically Signed   By: Franki Cabot M.D.   On: 04/12/2017 10:58   Result Date: 04/12/2017 CLINICAL DATA:  Recently diagnosed right triple negative breast cancer. Subsequent breast MRI showing 2 suspicious sites of non mass enhancement within each breast (4 sites total) for which MRI guided biopsies were recommended. Patient presents today for the MRI guided biopsies. EXAM: MRI GUIDED CORE NEEDLE BIOPSY OF THE BILATERAL BREAST TECHNIQUE: Multiplanar, multisequence MR imaging of the bilateral breasts was performed both before and after administration of intravenous contrast. CONTRAST:  43m MULTIHANCE GADOBENATE DIMEGLUMINE 529 MG/ML IV SOLN COMPARISON:  Previous exams.  FINDINGS: I met with the patient, and we discussed the procedure of MRI guided biopsy, including risks, benefits, and alternatives. Specifically, we discussed the risks of infection, bleeding, tissue injury, clip migration, and inadequate sampling. Informed, written consent was given. The usual time out protocol was performed immediately prior to the procedure. Left breast: Using sterile technique, 1% Lidocaine, MRI guidance, and a 9 gauge vacuum assisted device, biopsy was performed of linear non mass enhancement within the upper inner quadrant of the left breast, at anterior depth, using a lateral approach. At the conclusion of the procedure, a cylinder shaped tissue marker clip was deployed into the biopsy cavity. Next, using sterile technique, 1% lidocaine, MRI guidance, and a 9 gauge vacuum assisted device, biopsy was performed of non mass enhancement within the upper-outer quadrant of the left breast, at middle depth, using a lateral approach. At the conclusion of the procedure, a barbell shaped tissue marker clip was deployed into the biopsy cavity. Right breast: Next, using sterile technique, 1% lidocaine, MRI guidance, and a 9 gauge vacuum assisted device, biopsy was performed of the focal non mass enhancement within the lower inner quadrant of the right breast, at anterior depth, using a lateral approach. At the conclusion of the procedure, a cylinder shaped tissue marker clip was deployed into the biopsy cavity. Next, using sterile technique, 1% lidocaine, MRI guidance, and a 9 gauge vacuum assisted device, biopsy was performed of the linear non mass enhancement within the lower outer quadrant of the right breast, at posterior depth, using a lateral approach. At the conclusion of the procedure, a barbell shaped tissue clip was deployed in the biopsy cavity. Follow-up 2-view mammogram was performed and dictated separately. IMPRESSION: MRI guided biopsy of 2 sites of abnormal non mass enhancement within  each breast (4 total sites), as detailed above. No apparent complications. Electronically Signed: By: SFranki CabotM.D. On: 04/11/2017 11:10    ELIGIBLE FOR AVAILABLE RESEARCH PROTOCOL:UPBEAT trial --patient refused  ASSESSMENT: 51[place] right breast upper inner quadrant biopsy March 21, 2017 for a clinical T2 N0  Stage IIB invasive ductal carcinoma, grade 3, triple negative, with an MIB-1 of 80%.  (a) biopsy of 2 additional suspicious areas in the right breast and one in the left breast scheduled for April 11, 2017  (1) neoadjuvant chemotherapy will consist of doxorubicin and cyclophosphamide in dose dense fashion x4, followed by paclitaxel/carboplatin weekly x12  (2) definitive surgery to follow  (3) adjuvant  radiation as appropriate  (4) genetics testing scheduled for 04/22/2017, results pending  PLAN: Tarnesha did remarkably well with her first cycle of chemotherapy and even more remarkably she has not yet lost her hair.  Her counts have been excellent and we are proceeding with cycle 2 of 4 today.  The main problem she has had is reflux and gastritis likely secondary to the dexamethasone.  She is going to start Pepcid tonight and take it twice daily through Sunday.  After that she can go to once a day.  We discussed issues regarding intercourse and I think anything after 3 or 4 days of from chemo a condoms would not be necessary.  Incidentally she thinks she spotted some last week.  She is not having hot flashes.  She is not having periods of course because she is status post ablation  She will see [place] with each treatment and on each day 8 from the cycles and I have entered all the appropriate orders  She knows to call for any other problems that may develop before her next visit.   Barrie Wale, Virgie Dad, MD  04/25/17 12:35 PM Medical Oncology and Hematology [facility] [address] [place], [place] [address] Tel. [phone]     Fax. [phone]  This document serves as a record of services personally performed by Chauncey Cruel, MD. It was created on his behalf by Margit Banda, a trained medical scribe. The creation of this record is based on the scribe's personal observations and the provider's statements to them.   I have reviewed the above documentation for accuracy and completeness, and I agree with the above.

## 2017-04-27 ENCOUNTER — Ambulatory Visit (HOSPITAL_BASED_OUTPATIENT_CLINIC_OR_DEPARTMENT_OTHER): Payer: 59

## 2017-04-27 VITALS — BP 121/75 | HR 83 | Temp 98.4°F | Resp 18

## 2017-04-27 DIAGNOSIS — Z5189 Encounter for other specified aftercare: Secondary | ICD-10-CM | POA: Diagnosis not present

## 2017-04-27 DIAGNOSIS — C50411 Malignant neoplasm of upper-outer quadrant of right female breast: Secondary | ICD-10-CM

## 2017-04-27 DIAGNOSIS — Z171 Estrogen receptor negative status [ER-]: Principal | ICD-10-CM

## 2017-04-27 MED ORDER — PEGFILGRASTIM INJECTION 6 MG/0.6ML ~~LOC~~
6.0000 mg | PREFILLED_SYRINGE | Freq: Once | SUBCUTANEOUS | Status: AC
  Administered 2017-04-27: 6 mg via SUBCUTANEOUS

## 2017-04-27 NOTE — Patient Instructions (Signed)
Pegfilgrastim injection What is this medicine? PEGFILGRASTIM (PEG fil gra stim) is a long-acting granulocyte colony-stimulating factor that stimulates the growth of neutrophils, a type of white blood cell important in the body's fight against infection. It is used to reduce the incidence of fever and infection in patients with certain types of cancer who are receiving chemotherapy that affects the bone marrow, and to increase survival after being exposed to high doses of radiation. This medicine may be used for other purposes; ask your health care provider or pharmacist if you have questions. COMMON BRAND NAME(S): Neulasta What should I tell my health care provider before I take this medicine? They need to know if you have any of these conditions: -kidney disease -latex allergy -ongoing radiation therapy -sickle cell disease -skin reactions to acrylic adhesives (On-Body Injector only) -an unusual or allergic reaction to pegfilgrastim, filgrastim, other medicines, foods, dyes, or preservatives -pregnant or trying to get pregnant -breast-feeding How should I use this medicine? This medicine is for injection under the skin. If you get this medicine at home, you will be taught how to prepare and give the pre-filled syringe or how to use the On-body Injector. Refer to the patient Instructions for Use for detailed instructions. Use exactly as directed. Tell your healthcare provider immediately if you suspect that the On-body Injector may not have performed as intended or if you suspect the use of the On-body Injector resulted in a missed or partial dose. It is important that you put your used needles and syringes in a special sharps container. Do not put them in a trash can. If you do not have a sharps container, call your pharmacist or healthcare provider to get one. Talk to your pediatrician regarding the use of this medicine in children. While this drug may be prescribed for selected conditions,  precautions do apply. Overdosage: If you think you have taken too much of this medicine contact a poison control center or emergency room at once. NOTE: This medicine is only for you. Do not share this medicine with others. What if I miss a dose? It is important not to miss your dose. Call your doctor or health care professional if you miss your dose. If you miss a dose due to an On-body Injector failure or leakage, a new dose should be administered as soon as possible using a single prefilled syringe for manual use. What may interact with this medicine? Interactions have not been studied. Give your health care provider a list of all the medicines, herbs, non-prescription drugs, or dietary supplements you use. Also tell them if you smoke, drink alcohol, or use illegal drugs. Some items may interact with your medicine. This list may not describe all possible interactions. Give your health care provider a list of all the medicines, herbs, non-prescription drugs, or dietary supplements you use. Also tell them if you smoke, drink alcohol, or use illegal drugs. Some items may interact with your medicine. What should I watch for while using this medicine? You may need blood work done while you are taking this medicine. If you are going to need a MRI, CT scan, or other procedure, tell your doctor that you are using this medicine (On-Body Injector only). What side effects may I notice from receiving this medicine? Side effects that you should report to your doctor or health care professional as soon as possible: -allergic reactions like skin rash, itching or hives, swelling of the face, lips, or tongue -dizziness -fever -pain, redness, or irritation at site   where injected -pinpoint red spots on the skin -red or dark-brown urine -shortness of breath or breathing problems -stomach or side pain, or pain at the shoulder -swelling -tiredness -trouble passing urine or change in the amount of urine Side  effects that usually do not require medical attention (report to your doctor or health care professional if they continue or are bothersome): -bone pain -muscle pain This list may not describe all possible side effects. Call your doctor for medical advice about side effects. You may report side effects to FDA at 1-800-FDA-1088. Where should I keep my medicine? Keep out of the reach of children. Store pre-filled syringes in a refrigerator between 2 and 8 degrees C (36 and 46 degrees F). Do not freeze. Keep in carton to protect from light. Throw away this medicine if it is left out of the refrigerator for more than 48 hours. Throw away any unused medicine after the expiration date. NOTE: This sheet is a summary. It may not cover all possible information. If you have questions about this medicine, talk to your doctor, pharmacist, or health care provider.  2018 Elsevier/Gold Standard (2016-05-10 12:58:03)  

## 2017-04-29 ENCOUNTER — Telehealth: Payer: Self-pay | Admitting: Oncology

## 2017-04-29 NOTE — Telephone Encounter (Signed)
04/29/2017 faxed medical records to [facility] with [employer] to [phone] successfully @ 6:17 am

## 2017-04-30 DIAGNOSIS — K219 Gastro-esophageal reflux disease without esophagitis: Secondary | ICD-10-CM | POA: Diagnosis not present

## 2017-04-30 DIAGNOSIS — Z0000 Encounter for general adult medical examination without abnormal findings: Secondary | ICD-10-CM | POA: Diagnosis not present

## 2017-04-30 DIAGNOSIS — C50919 Malignant neoplasm of unspecified site of unspecified female breast: Secondary | ICD-10-CM | POA: Diagnosis not present

## 2017-05-01 ENCOUNTER — Other Ambulatory Visit: Payer: 59

## 2017-05-01 ENCOUNTER — Ambulatory Visit (HOSPITAL_BASED_OUTPATIENT_CLINIC_OR_DEPARTMENT_OTHER): Payer: 59

## 2017-05-01 ENCOUNTER — Ambulatory Visit (HOSPITAL_BASED_OUTPATIENT_CLINIC_OR_DEPARTMENT_OTHER): Payer: 59 | Admitting: Adult Health

## 2017-05-01 ENCOUNTER — Ambulatory Visit: Payer: 59

## 2017-05-01 ENCOUNTER — Telehealth: Payer: Self-pay | Admitting: Adult Health

## 2017-05-01 ENCOUNTER — Other Ambulatory Visit (HOSPITAL_BASED_OUTPATIENT_CLINIC_OR_DEPARTMENT_OTHER): Payer: 59

## 2017-05-01 ENCOUNTER — Encounter: Payer: Self-pay | Admitting: Adult Health

## 2017-05-01 VITALS — BP 120/70 | HR 95 | Temp 98.7°F | Resp 20 | Wt 161.7 lb

## 2017-05-01 DIAGNOSIS — Z171 Estrogen receptor negative status [ER-]: Secondary | ICD-10-CM | POA: Diagnosis not present

## 2017-05-01 DIAGNOSIS — C50411 Malignant neoplasm of upper-outer quadrant of right female breast: Secondary | ICD-10-CM

## 2017-05-01 LAB — CBC WITH DIFFERENTIAL/PLATELET
BASO%: 0.5 % (ref 0.0–2.0)
BASOS ABS: 0 10*3/uL (ref 0.0–0.1)
EOS%: 1.9 % (ref 0.0–7.0)
Eosinophils Absolute: 0.1 10*3/uL (ref 0.0–0.5)
HEMATOCRIT: 34 % — AB (ref 34.8–46.6)
HGB: 11.7 g/dL (ref 11.6–15.9)
LYMPH#: 0.6 10*3/uL — AB (ref 0.9–3.3)
LYMPH%: 13.2 % — AB (ref 14.0–49.7)
MCH: 32.9 pg (ref 25.1–34.0)
MCHC: 34.4 g/dL (ref 31.5–36.0)
MCV: 95.5 fL (ref 79.5–101.0)
MONO#: 0.1 10*3/uL (ref 0.1–0.9)
MONO%: 3.3 % (ref 0.0–14.0)
NEUT#: 3.5 10*3/uL (ref 1.5–6.5)
NEUT%: 81.1 % — AB (ref 38.4–76.8)
Platelets: 215 10*3/uL (ref 145–400)
RBC: 3.56 10*6/uL — AB (ref 3.70–5.45)
RDW: 12.2 % (ref 11.2–14.5)
WBC: 4.3 10*3/uL (ref 3.9–10.3)

## 2017-05-01 LAB — COMPREHENSIVE METABOLIC PANEL
ALBUMIN: 3.5 g/dL (ref 3.5–5.0)
ALK PHOS: 104 U/L (ref 40–150)
ALT: 10 U/L (ref 0–55)
AST: 11 U/L (ref 5–34)
Anion Gap: 9 mEq/L (ref 3–11)
BILIRUBIN TOTAL: 0.31 mg/dL (ref 0.20–1.20)
BUN: 10 mg/dL (ref 7.0–26.0)
CO2: 23 meq/L (ref 22–29)
CREATININE: 0.6 mg/dL (ref 0.6–1.1)
Calcium: 8.5 mg/dL (ref 8.4–10.4)
Chloride: 105 mEq/L (ref 98–109)
EGFR: >60 mL/min/{1.73_m2} (ref >60)
GLUCOSE: 91 mg/dL (ref 70–140)
Potassium: 4.1 mEq/L (ref 3.5–5.1)
SODIUM: 136 meq/L (ref 136–145)
TOTAL PROTEIN: 6.3 g/dL — AB (ref 6.4–8.3)

## 2017-05-01 MED ORDER — OMEPRAZOLE 40 MG PO CPDR: 40.0000 mg | DELAYED_RELEASE_CAPSULE | Freq: Two times a day (BID) | ORAL | 0 refills | Status: DC

## 2017-05-01 MED ORDER — SODIUM CHLORIDE 0.9% FLUSH
10.0000 mL | Freq: Once | INTRAVENOUS | Status: AC
  Administered 2017-05-01: 10 mL
  Filled 2017-05-01: qty 10

## 2017-05-01 MED ORDER — HEPARIN SOD (PORK) LOCK FLUSH 100 UNIT/ML IV SOLN
500.0000 [IU] | Freq: Once | INTRAVENOUS | Status: AC
  Administered 2017-05-01: 500 [IU]
  Filled 2017-05-01: qty 5

## 2017-05-01 NOTE — Telephone Encounter (Signed)
Gave patient AVS and calendar of upcoming December appointments.  °

## 2017-05-01 NOTE — Progress Notes (Signed)
[place]  [phone]) [phone] [phone]) [phone]     ID: Jennifer Beck DOB: [birth year]  MR#: [id number]  XTK#:[id number]  Patient Care Team: [name], MD as PCP - General (Internal Medicine) [name], MD as Consulting Physician (Dermatology) [name], DC as Referring Physician (Chiropractic Medicine) [name], MD as Consulting Physician (General Surgery) [name], MD as Attending Physician (Radiation Oncology) [name], MD as Consulting Physician (Obstetrics and Gynecology) OTHER MD:  CHIEF COMPLAINT: Triple negative breast cancer  CURRENT TREATMENT: Neoadjuvant chemotherapy   HISTORY OF CURRENT ILLNESS:  From the original intake note:  Itali palpated a mass in her right breast sometime in September 2018.Jennifer Beck She brought this to medical attention and her PCP, [name], set her up on 03/18/2017 for a unilateral right diagnostic mammography with ultrasonography, showing: Breast density D. The palpable right breast mass at 12:30 was indeterminate, and biopsy was warranted at that time. No evidence of right axillary lymphadenopathy was noted. Biopsy of the lesion in question on 03/21/2017 at the right breast 12:30 position showed ([id number])  invasive ductal carcinoma with lymphovascular invasion.  The tumor was grade 3, triple negative, with an MIB-1 of 80%.  She is accompanied by her husband, [name] to the office today. She reports that she is doing well overall. She initially felt a lump in September in the shower and notes that she doesn't complete monthly self breast exams. She had a routine mammogram in March 2018 at [facility] and she is followed by Dr. [name].  As far as surgeries, she has had two laparoscopic procedures for endometriosis as well as a bilateral tubal ligation. She still has occasional cramping, painful ovulation, and vaginal spotting that comes and goes. She has had cyst  to her breast before that have been evaluated and ruled as negative. She notes that she still has her tonsils, gallbladder, and appendix. She has a prior hx of pericarditis in 2003 that she notes was brought onby a virus. She has since been cleared by her prior Cardiologist.   She has a Restaurant manager, fast food, [name] that she sees every 5 weeks for maintenance of her neck and back issues. She has a dermatologist, [name] at [facility] and she has had an biopsy of an area from her left chest that resulted as basal cell carcinoma in 2015. She denies having a GI specialist, Cardiologist, or Orthopedist at this time. She denies prior history of seizures, migraines, acid reflux, asthma, emphysema, palpitations or GI issues.    [name] is as detailed below.  INTERVAL HISTORY: Jennifer Beck returns today for follow-up and treatment of her triple negative breast cancer. Accompanied by her husband and mother. Today is day 8 cycle 2 of 4 planned cycles of cyclophosphamide and doxorubicin given every 14 days, to be followed by taxanes.  She is tolerating this well.    REVIEW OF SYSTEMS:  Cortne is tolerating treatment well.  She has noted more of a globus sensation in her throat that started when she received the Dexamethasone.  She is taking Pepcid with minimal relief.  She denies any dysphagia, odynophagia.  She did have some mild back pain following the Neulasta. She denies any mucositis, fevers, chills, nausea, vomiting, constipation, diarrhea, or any other concerns.  A detailed ROS is otherwise non contributory.    PAST MEDICAL HISTORY: Past Medical History:  Diagnosis Date  . Chest pain   . Complication of anesthesia   .  Endometriosis   . Family history of breast cancer 04/22/2017  . Family history of colon cancer 04/22/2017  . Family history of kidney cancer 04/22/2017  . Fatigue   . GERD (gastroesophageal reflux disease)   . GI bleed    caused by  ischemic colitis following an episode of hypertension  . Heart palpitations   . Hx of echocardiogram    a. echo 2023/02/21:  EF 55-60%, Gr 2 diast dysfn  . Hypercholesterolemia   . Ischemic colitis ([place])   . 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 Myocarditis ([place])   . Pericarditis   . PONV (postoperative nausea and vomiting)   . SOB (shortness of breath)     PAST SURGICAL HISTORY: Past Surgical History:  Procedure Laterality Date  . CARDIAC CATHETERIZATION  08/26/2001   EF of 60% --- smooth & normal coronary arteries -- there is a [place] motion defect consistent with posterolateral MI -- she quite possibly had a coronary spasm -- she may have some pericarditis secondary to her MI   . DILATION AND CURETTAGE OF UTERUS  10/15/2003   Uterine enlargement, menorrhagia  . DILATION AND CURETTAGE OF UTERUS  05/27/2002   Dysfunctional uterine bleeding  . LAPAROSCOPY    . NOVASURE ABLATION  10/15/2003   for management of her extreme menorrhagi  . PORTACATH PLACEMENT Right 04/02/2017   Procedure: INSERTION PORT-A-CATH;  Surgeon: [name], MD;  Location: [facility];  Service: General;  Laterality: Right;  . TUBAL LIGATION  09/2000   bilateral    FAMILY HISTORY Family History  Problem Relation Age of Onset  . Hypertension Father   . Diabetes Father   . Other Father        stomach mass suspicious for ca, never bx  . Coronary artery disease Mother   . Kidney cancer Mother 3  . Coronary artery disease Maternal Grandfather   . Colon cancer Maternal Grandfather 61       recurrence  . Coronary artery disease Maternal Uncle   . Coronary artery disease Maternal Uncle   . Diabetes Maternal Uncle   . Colon cancer Maternal Grandmother 65  . Breast cancer Cousin 42       had GT- CHEK2+  . Cervical cancer Maternal Aunt 35  . Diabetes Maternal Aunt   . Alzheimer's disease Paternal Grandmother   . Colon cancer Paternal Grandfather 21  . Breast  cancer Paternal Aunt   . Breast cancer Paternal Aunt   . Breast cancer Other 50   Her father died last 02/21/2016 at age 60 just 16 week shy of his 83rd birthday. Her mother is 67 years old as of October 2018. Patient has one brother and no sisters. Her mother was diagnosed with kidney cancer at age 20 with a nephrectomy following. Her maternal grandmother was diagnosed with colon cancer at age 57.  The patient paternal grandfather was diagnosed with colon cancer in his 45's. She has paternal cousins through her fathers half-sisters that have been diagnosed with breast cancer, she is unsure of the number of breast cancer occurrences due to the distance . A maternal cousin was diagnosed with breast cancer at age 67 and she had genetic testing. The patient does have a copy of this testing as well as her PCP.  This was not available for review on the nasal  GYNECOLOGIC HISTORY:  No LMP recorded. Patient has had an ablation.   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 Her husband [name] is a Airline pilot. Her daughter [name] is 54 and currently a sophomore at [employer]. 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  The patient attends [employer]. She lives in [place].      ADVANCED DIRECTIVES:    HEALTH MAINTENANCE: Social History   Tobacco Use  . Smoking status: Never Smoker  . Smokeless tobacco: Never Used  Substance Use Topics  . Alcohol use: Yes    Comment: Occasionally drinks wine  . Drug use: No     Colonoscopy: Yes, 2003  PAP: March 2018   Bone density: N/A   Allergies  Allergen Reactions  . Codeine Itching  . Biaxin [Clarithromycin]     Heart Burn    Current Outpatient  Medications  Medication Sig Dispense Refill  . acetaminophen (TYLENOL) 325 MG tablet Take 650 mg by mouth every 6 (six) hours as needed.    . ALPRAZolam (XANAX) 0.25 MG tablet Take 0.25 mg by mouth daily as needed for anxiety.    . Cholecalciferol (VITAMIN D) 2000 units CAPS Take 2,000 Units by mouth daily.    Jennifer Beck dexamethasone (DECADRON) 4 MG tablet Take 2 tablets by mouth once a day on the day after chemotherapy and then take 2 tablets two times a day for 2 days. Take with food. 30 tablet 1  . Evening Primrose Oil 1000 MG CAPS Take 1 capsule by mouth every evening.     . famotidine (PEPCID) 10 MG tablet Take 10 mg by mouth 2 (two) times daily.    . fluocinonide cream (LIDEX) 8.54 % Apply 1 application topically 2 (two) times daily as needed.     Jennifer Beck glucosamine-chondroitin 500-400 MG tablet Take 1 tablet by mouth daily.     Jennifer Beck ibuprofen (ADVIL,MOTRIN) 200 MG tablet Take 400 mg by mouth every 8 (eight) hours as needed for headache or mild pain.     Jennifer Beck lidocaine-prilocaine (EMLA) cream Apply to affected area once 30 g 3  . LORazepam (ATIVAN) 0.5 MG tablet Take 1 tablet (0.5 mg total) by mouth at bedtime as needed (Nausea or vomiting). 30 tablet 0  . Melatonin 5 MG TABS Take 5 mg by mouth at bedtime as needed (sleep).     . Multiple Minerals-Vitamins (CALCIUM-MAGNESIUM-ZINC) TABS Take 1 tablet by mouth daily.     . Multiple Vitamins-Minerals (MULTIVITAMINS THER. W/MINERALS) TABS Take 1 tablet by mouth daily.     . Omega-3 Fatty Acids (FISH OIL) 1000 MG CAPS Take 1,000 mg by mouth 2 (two) times a week.     Vladimir Faster Glycol-Propyl Glycol (SYSTANE OP) Apply 1 drop to eye 2 (two) times daily.    . prochlorperazine (COMPAZINE) 10 MG tablet Take 1 tablet (10 mg total) by mouth every 6 (six) hours as needed (Nausea or vomiting). 30 tablet 1  . VITAMIN E PO Take 1 tablet by mouth daily.     Jennifer Beck omeprazole (PRILOSEC) 40 MG capsule Take 1 capsule (40 mg total) by mouth 2 (two) times daily. 60 capsule 0   No  current facility-administered medications for this visit.     OBJECTIVE:   Vitals:   05/01/17 0946  BP:  120/70  Pulse: 95  Resp: 20  Temp: 98.7 F (37.1 C)  SpO2: 100%     Body mass index is 26.91 kg/m.   Wt Readings from Last 3 Encounters:  05/01/17 161 lb 11.2 oz (73.3 kg)  04/25/17 161 lb 11.2 oz (73.3 kg)  04/17/17 161 lb (73 kg)      ECOG FS:1  GENERAL: Patient is a well appearing female in no acute distress HEENT:  Sclerae anicteric. Perrl. Oropharynx clear and moist. No ulcerations or evidence of oropharyngeal candidiasis. Neck is supple.  NODES:  No cervical, supraclavicular, or axillary lymphadenopathy palpated.  BREAST EXAM:  Deferred. LUNGS:  Clear to auscultation bilaterally.  No wheezes or rhonchi. HEART:  Regular rate and rhythm. No murmur appreciated. ABDOMEN:  Soft, nontender.  Positive, normoactive bowel sounds. No organomegaly palpated. MSK:  No focal spinal tenderness to palpation. Full range of motion bilaterally in the upper extremities. EXTREMITIES:  No peripheral edema.   SKIN:  Clear with no obvious rashes or skin changes. No nail dyscrasia. NEURO:  Nonfocal. Well oriented.  Appropriate affect.     LAB RESULTS:  CMP     Component Value Date/Time   NA 137 04/25/2017 1106   K 3.9 04/25/2017 1106   CL 106 02/05/2012 1742   CO2 23 04/25/2017 1106   GLUCOSE 110 04/25/2017 1106   BUN 7.8 04/25/2017 1106   CREATININE 0.7 04/25/2017 1106   CALCIUM 8.8 04/25/2017 1106   PROT 6.9 04/25/2017 1106   ALBUMIN 3.8 04/25/2017 1106   AST 14 04/25/2017 1106   ALT 14 04/25/2017 1106   ALKPHOS 100 04/25/2017 1106   BILITOT <0.22 04/25/2017 1106    No results found for: TOTALPROTELP, ALBUMINELP, A1GS, A2GS, BETS, BETA2SER, GAMS, MSPIKE, SPEI  No results found for: KPAFRELGTCHN, LAMBDASER, KAPLAMBRATIO  Lab Results  Component Value Date   WBC 4.3 05/01/2017   NEUTROABS 3.5 05/01/2017   HGB 11.7 05/01/2017   HCT 34.0 (L) 05/01/2017   MCV 95.5  05/01/2017   PLT 215 05/01/2017    _0 @  No results found for: LABCA2  No components found for: MVEHMC947  No results for input(s): INR in the last 168 hours.  No results found for: LABCA2  No results found for: SJG283  No results found for: MOQ947  No results found for: MLY650  No results found for: CA2729  No components found for: HGQUANT  No results found for: CEA1 / No results found for: CEA1   No results found for: AFPTUMOR  No results found for: CHROMOGRNA  No results found for: PSA1  Orders Only on 05/01/2017  Component Date Value Ref Range Status  . WBC 05/01/2017 4.3  3.9 - 10.3 10e3/uL Final  . NEUT# 05/01/2017 3.5  1.5 - 6.5 10e3/uL Final  . HGB 05/01/2017 11.7  11.6 - 15.9 g/dL Final  . HCT 05/01/2017 34.0* 34.8 - 46.6 % Final  . Platelets 05/01/2017 215  145 - 400 10e3/uL Final  . MCV 05/01/2017 95.5  79.5 - 101.0 fL Final  . MCH 05/01/2017 32.9  25.1 - 34.0 pg Final  . MCHC 05/01/2017 34.4  31.5 - 36.0 g/dL Final  . RBC 05/01/2017 3.56* 3.70 - 5.45 10e6/uL Final  . RDW 05/01/2017 12.2  11.2 - 14.5 % Final  . lymph# 05/01/2017 0.6* 0.9 - 3.3 10e3/uL Final  . MONO# 05/01/2017 0.1  0.1 - 0.9 10e3/uL Final  . Eosinophils Absolute 05/01/2017 0.1  0.0 - 0.5 10e3/uL Final  . Basophils Absolute 05/01/2017  0.0  0.0 - 0.1 10e3/uL Final  . NEUT% 05/01/2017 81.1* 38.4 - 76.8 % Final  . LYMPH% 05/01/2017 13.2* 14.0 - 49.7 % Final  . MONO% 05/01/2017 3.3  0.0 - 14.0 % Final  . EOS% 05/01/2017 1.9  0.0 - 7.0 % Final  . BASO% 05/01/2017 0.5  0.0 - 2.0 % Final    (this displays the last labs from the last 3 days)  No results found for: TOTALPROTELP, ALBUMINELP, A1GS, A2GS, BETS, BETA2SER, GAMS, MSPIKE, SPEI (this displays SPEP labs)  No results found for: KPAFRELGTCHN, LAMBDASER, KAPLAMBRATIO (kappa/lambda light chains)  No results found for: HGBA, HGBA2QUANT, HGBFQUANT, HGBSQUAN (Hemoglobinopathy evaluation)   No results found for: LDH  No  results found for: IRON, TIBC, IRONPCTSAT (Iron and TIBC)  No results found for: FERRITIN  Urinalysis No results found for: COLORURINE, APPEARANCEUR, LABSPEC, PHURINE, GLUCOSEU, HGBUR, BILIRUBINUR, KETONESUR, PROTEINUR, UROBILINOGEN, NITRITE, LEUKOCYTESUR   STUDIES: Dg Chest Port 1 View  Result Date: 04/02/2017 CLINICAL DATA:  Postop for Port-A-Cath placement.  Breast cancer. EXAM: PORTABLE CHEST 1 VIEW COMPARISON:  Breast MR 03/28/2017 FINDINGS: Right-sided Port-A-Cath terminates at the high SVC. Midline trachea. Normal heart size and mediastinal contours. No pleural effusion or pneumothorax. Clear lungs. IMPRESSION: Right-sided Port-A-Cath terminating at the high SVC; no pneumothorax. Electronically Signed   By: Abigail Miyamoto M.D.   On: 04/02/2017 11:43   Dg C-arm 1-60 Min-no Report  Result Date: 04/02/2017 Fluoroscopy was utilized by the requesting physician.  No radiographic interpretation.   Mm Clip Placement Left  Result Date: 04/11/2017 CLINICAL DATA:  Status post bilateral MRI guided breast biopsies, 2 sites of non mass enhancement within each breast (4 total sites). Recently diagnosed right breast cancer. EXAM: DIAGNOSTIC BILATERAL MAMMOGRAM POST MRI BIOPSY COMPARISON:  Previous exam(s). FINDINGS: Mammographic images were obtained following MRI guided biopsy of 4 sites of abnormal non mass enhancement within the bilateral breast (2 sites for breast). All clips appear appropriately positioned. IMPRESSION: 1. Biopsy site 1: Cylinder shaped clip is appropriately positioned within the upper inner quadrant of the left breast. 2. Biopsy site 2: Barbell shaped clip is appropriately positioned within the upper outer quadrant of the left breast. 3. Biopsy site 3: Cylinder shaped clip is appropriately positioned within the lower inner quadrant of the right breast. 4. Biopsy site 4: Barbell shaped clip is appropriately position within the lower outer quadrant of the right breast. A ribbon shaped  clip within the right breast corresponds to the site of a recent biopsy-proven breast cancer. Final Assessment: Post Procedure Mammograms for Marker Placement Electronically Signed   By: Franki Cabot M.D.   On: 04/11/2017 11:24   Mm Clip Placement Right  Result Date: 04/11/2017 CLINICAL DATA:  Status post bilateral MRI guided breast biopsies, 2 sites of non mass enhancement within each breast (4 total sites). Recently diagnosed right breast cancer. EXAM: DIAGNOSTIC BILATERAL MAMMOGRAM POST MRI BIOPSY COMPARISON:  Previous exam(s). FINDINGS: Mammographic images were obtained following MRI guided biopsy of 4 sites of abnormal non mass enhancement within the bilateral breast (2 sites for breast). All clips appear appropriately positioned. IMPRESSION: 1. Biopsy site 1: Cylinder shaped clip is appropriately positioned within the upper inner quadrant of the left breast. 2. Biopsy site 2: Barbell shaped clip is appropriately positioned within the upper outer quadrant of the left breast. 3. Biopsy site 3: Cylinder shaped clip is appropriately positioned within the lower inner quadrant of the right breast. 4. Biopsy site 4: Barbell shaped clip is appropriately position within  the lower outer quadrant of the right breast. A ribbon shaped clip within the right breast corresponds to the site of a recent biopsy-proven breast cancer. Final Assessment: Post Procedure Mammograms for Marker Placement Electronically Signed   By: Franki Cabot M.D.   On: 04/11/2017 11:24   Mr Aundra Millet Breast Bx W Loc Dev 1st Lesion Image Bx Spec Mr Guide  Addendum Date: 04/12/2017   ADDENDUM REPORT: 04/12/2017 10:58 ADDENDUM: PATHOLOGY: 1.  Left breast, upper inner quadrant - hemorrhage. 2.  Left breast, upper outer quadrant -ALH in sclerosing adenosis 3.  Right breast, lower inner quadrant - grade 2 IMC and MCIS 4. Right breast, lower outer quadrant- MCIS, sclerosing adenosis, FCC CONCORDANT: 1.  Discordant - surgical excision recommended 2.   Concordant - surgical excision recommended. 3.  Concordant 4.  Concordant I telephoned the patient on 04/12/2017 at 10 a.m. and discussed these results and the recommendations stated below. All questions were answered. The patient denies significant pain or bleeding from the biopsy site. Patient was instructed to contact the [facility] if pain increased or bleeding reoccurred. RECOMMENDATION: Surgical excision for all biopsy sites if breast conservation is considered. Also, there is an additional focus of non mass enhancement within the upper left breast, 12 o'clock axis region, between the 2 biopsy sites, for which additional MRI guided biopsy is recommended if breast conservation is considered for the left breast. Findings and recommendations discussed with Dr. Lucia Gaskins. Electronically Signed   By: Franki Cabot M.D.   On: 04/12/2017 10:58   Result Date: 04/12/2017 CLINICAL DATA:  Recently diagnosed right triple negative breast cancer. Subsequent breast MRI showing 2 suspicious sites of non mass enhancement within each breast (4 sites total) for which MRI guided biopsies were recommended. Patient presents today for the MRI guided biopsies. EXAM: MRI GUIDED CORE NEEDLE BIOPSY OF THE BILATERAL BREAST TECHNIQUE: Multiplanar, multisequence MR imaging of the bilateral breasts was performed both before and after administration of intravenous contrast. CONTRAST:  44m MULTIHANCE GADOBENATE DIMEGLUMINE 529 MG/ML IV SOLN COMPARISON:  Previous exams. FINDINGS: I met with the patient, and we discussed the procedure of MRI guided biopsy, including risks, benefits, and alternatives. Specifically, we discussed the risks of infection, bleeding, tissue injury, clip migration, and inadequate sampling. Informed, written consent was given. The usual time out protocol was performed immediately prior to the procedure. Left breast: Using sterile technique, 1% Lidocaine, MRI guidance, and a 9 gauge vacuum  assisted device, biopsy was performed of linear non mass enhancement within the upper inner quadrant of the left breast, at anterior depth, using a lateral approach. At the conclusion of the procedure, a cylinder shaped tissue marker clip was deployed into the biopsy cavity. Next, using sterile technique, 1% lidocaine, MRI guidance, and a 9 gauge vacuum assisted device, biopsy was performed of non mass enhancement within the upper-outer quadrant of the left breast, at middle depth, using a lateral approach. At the conclusion of the procedure, a barbell shaped tissue marker clip was deployed into the biopsy cavity. Right breast: Next, using sterile technique, 1% lidocaine, MRI guidance, and a 9 gauge vacuum assisted device, biopsy was performed of the focal non mass enhancement within the lower inner quadrant of the right breast, at anterior depth, using a lateral approach. At the conclusion of the procedure, a cylinder shaped tissue marker clip was deployed into the biopsy cavity. Next, using sterile technique, 1% lidocaine, MRI guidance, and a 9 gauge vacuum assisted device, biopsy was performed of  the linear non mass enhancement within the lower outer quadrant of the right breast, at posterior depth, using a lateral approach. At the conclusion of the procedure, a barbell shaped tissue clip was deployed in the biopsy cavity. Follow-up 2-view mammogram was performed and dictated separately. IMPRESSION: MRI guided biopsy of 2 sites of abnormal non mass enhancement within each breast (4 total sites), as detailed above. No apparent complications. Electronically Signed: By: Franki Cabot M.D. On: 04/11/2017 11:10   Mr Aundra Millet Breast Bx W Loc Dev Ea Add Lesion Image Bx Spec Mr Guide  Addendum Date: 04/12/2017   ADDENDUM REPORT: 04/12/2017 10:58 ADDENDUM: PATHOLOGY: 1.  Left breast, upper inner quadrant - hemorrhage. 2.  Left breast, upper outer quadrant -ALH in sclerosing adenosis 3.  Right breast, lower inner quadrant -  grade 2 IMC and MCIS 4. Right breast, lower outer quadrant- MCIS, sclerosing adenosis, FCC CONCORDANT: 1.  Discordant - surgical excision recommended 2.  Concordant - surgical excision recommended. 3.  Concordant 4.  Concordant I telephoned the patient on 04/12/2017 at 10 a.m. and discussed these results and the recommendations stated below. All questions were answered. The patient denies significant pain or bleeding from the biopsy site. Patient was instructed to contact the [facility] if pain increased or bleeding reoccurred. RECOMMENDATION: Surgical excision for all biopsy sites if breast conservation is considered. Also, there is an additional focus of non mass enhancement within the upper left breast, 12 o'clock axis region, between the 2 biopsy sites, for which additional MRI guided biopsy is recommended if breast conservation is considered for the left breast. Findings and recommendations discussed with Dr. Lucia Gaskins. Electronically Signed   By: Franki Cabot M.D.   On: 04/12/2017 10:58   Result Date: 04/12/2017 CLINICAL DATA:  Recently diagnosed right triple negative breast cancer. Subsequent breast MRI showing 2 suspicious sites of non mass enhancement within each breast (4 sites total) for which MRI guided biopsies were recommended. Patient presents today for the MRI guided biopsies. EXAM: MRI GUIDED CORE NEEDLE BIOPSY OF THE BILATERAL BREAST TECHNIQUE: Multiplanar, multisequence MR imaging of the bilateral breasts was performed both before and after administration of intravenous contrast. CONTRAST:  16m MULTIHANCE GADOBENATE DIMEGLUMINE 529 MG/ML IV SOLN COMPARISON:  Previous exams. FINDINGS: I met with the patient, and we discussed the procedure of MRI guided biopsy, including risks, benefits, and alternatives. Specifically, we discussed the risks of infection, bleeding, tissue injury, clip migration, and inadequate sampling. Informed, written consent was given. The usual time  out protocol was performed immediately prior to the procedure. Left breast: Using sterile technique, 1% Lidocaine, MRI guidance, and a 9 gauge vacuum assisted device, biopsy was performed of linear non mass enhancement within the upper inner quadrant of the left breast, at anterior depth, using a lateral approach. At the conclusion of the procedure, a cylinder shaped tissue marker clip was deployed into the biopsy cavity. Next, using sterile technique, 1% lidocaine, MRI guidance, and a 9 gauge vacuum assisted device, biopsy was performed of non mass enhancement within the upper-outer quadrant of the left breast, at middle depth, using a lateral approach. At the conclusion of the procedure, a barbell shaped tissue marker clip was deployed into the biopsy cavity. Right breast: Next, using sterile technique, 1% lidocaine, MRI guidance, and a 9 gauge vacuum assisted device, biopsy was performed of the focal non mass enhancement within the lower inner quadrant of the right breast, at anterior depth, using a lateral approach. At the conclusion of  the procedure, a cylinder shaped tissue marker clip was deployed into the biopsy cavity. Next, using sterile technique, 1% lidocaine, MRI guidance, and a 9 gauge vacuum assisted device, biopsy was performed of the linear non mass enhancement within the lower outer quadrant of the right breast, at posterior depth, using a lateral approach. At the conclusion of the procedure, a barbell shaped tissue clip was deployed in the biopsy cavity. Follow-up 2-view mammogram was performed and dictated separately. IMPRESSION: MRI guided biopsy of 2 sites of abnormal non mass enhancement within each breast (4 total sites), as detailed above. No apparent complications. Electronically Signed: By: Franki Cabot M.D. On: 04/11/2017 11:10   Mr Rick Duff Breast Bx W Loc Dev 1st Lesion Image Bx Spec Mr Guide  Addendum Date: 04/12/2017   ADDENDUM REPORT: 04/12/2017 10:58 ADDENDUM: PATHOLOGY: 1.  Left  breast, upper inner quadrant - hemorrhage. 2.  Left breast, upper outer quadrant -ALH in sclerosing adenosis 3.  Right breast, lower inner quadrant - grade 2 IMC and MCIS 4. Right breast, lower outer quadrant- MCIS, sclerosing adenosis, FCC CONCORDANT: 1.  Discordant - surgical excision recommended 2.  Concordant - surgical excision recommended. 3.  Concordant 4.  Concordant I telephoned the patient on 04/12/2017 at 10 a.m. and discussed these results and the recommendations stated below. All questions were answered. The patient denies significant pain or bleeding from the biopsy site. Patient was instructed to contact the [facility] if pain increased or bleeding reoccurred. RECOMMENDATION: Surgical excision for all biopsy sites if breast conservation is considered. Also, there is an additional focus of non mass enhancement within the upper left breast, 12 o'clock axis region, between the 2 biopsy sites, for which additional MRI guided biopsy is recommended if breast conservation is considered for the left breast. Findings and recommendations discussed with Dr. Lucia Gaskins. Electronically Signed   By: Franki Cabot M.D.   On: 04/12/2017 10:58   Result Date: 04/12/2017 CLINICAL DATA:  Recently diagnosed right triple negative breast cancer. Subsequent breast MRI showing 2 suspicious sites of non mass enhancement within each breast (4 sites total) for which MRI guided biopsies were recommended. Patient presents today for the MRI guided biopsies. EXAM: MRI GUIDED CORE NEEDLE BIOPSY OF THE BILATERAL BREAST TECHNIQUE: Multiplanar, multisequence MR imaging of the bilateral breasts was performed both before and after administration of intravenous contrast. CONTRAST:  82m MULTIHANCE GADOBENATE DIMEGLUMINE 529 MG/ML IV SOLN COMPARISON:  Previous exams. FINDINGS: I met with the patient, and we discussed the procedure of MRI guided biopsy, including risks, benefits, and alternatives. Specifically, we  discussed the risks of infection, bleeding, tissue injury, clip migration, and inadequate sampling. Informed, written consent was given. The usual time out protocol was performed immediately prior to the procedure. Left breast: Using sterile technique, 1% Lidocaine, MRI guidance, and a 9 gauge vacuum assisted device, biopsy was performed of linear non mass enhancement within the upper inner quadrant of the left breast, at anterior depth, using a lateral approach. At the conclusion of the procedure, a cylinder shaped tissue marker clip was deployed into the biopsy cavity. Next, using sterile technique, 1% lidocaine, MRI guidance, and a 9 gauge vacuum assisted device, biopsy was performed of non mass enhancement within the upper-outer quadrant of the left breast, at middle depth, using a lateral approach. At the conclusion of the procedure, a barbell shaped tissue marker clip was deployed into the biopsy cavity. Right breast: Next, using sterile technique, 1% lidocaine, MRI guidance, and a 9 gauge  vacuum assisted device, biopsy was performed of the focal non mass enhancement within the lower inner quadrant of the right breast, at anterior depth, using a lateral approach. At the conclusion of the procedure, a cylinder shaped tissue marker clip was deployed into the biopsy cavity. Next, using sterile technique, 1% lidocaine, MRI guidance, and a 9 gauge vacuum assisted device, biopsy was performed of the linear non mass enhancement within the lower outer quadrant of the right breast, at posterior depth, using a lateral approach. At the conclusion of the procedure, a barbell shaped tissue clip was deployed in the biopsy cavity. Follow-up 2-view mammogram was performed and dictated separately. IMPRESSION: MRI guided biopsy of 2 sites of abnormal non mass enhancement within each breast (4 total sites), as detailed above. No apparent complications. Electronically Signed: By: Franki Cabot M.D. On: 04/11/2017 11:10   Mr Rt  Breast Bx W Loc Dev Ea Add Lesion Image Bx Spec Mr Guide  Addendum Date: 04/12/2017   ADDENDUM REPORT: 04/12/2017 10:58 ADDENDUM: PATHOLOGY: 1.  Left breast, upper inner quadrant - hemorrhage. 2.  Left breast, upper outer quadrant -ALH in sclerosing adenosis 3.  Right breast, lower inner quadrant - grade 2 IMC and MCIS 4. Right breast, lower outer quadrant- MCIS, sclerosing adenosis, FCC CONCORDANT: 1.  Discordant - surgical excision recommended 2.  Concordant - surgical excision recommended. 3.  Concordant 4.  Concordant I telephoned the patient on 04/12/2017 at 10 a.m. and discussed these results and the recommendations stated below. All questions were answered. The patient denies significant pain or bleeding from the biopsy site. Patient was instructed to contact the [facility] if pain increased or bleeding reoccurred. RECOMMENDATION: Surgical excision for all biopsy sites if breast conservation is considered. Also, there is an additional focus of non mass enhancement within the upper left breast, 12 o'clock axis region, between the 2 biopsy sites, for which additional MRI guided biopsy is recommended if breast conservation is considered for the left breast. Findings and recommendations discussed with Dr. Lucia Gaskins. Electronically Signed   By: Franki Cabot M.D.   On: 04/12/2017 10:58   Result Date: 04/12/2017 CLINICAL DATA:  Recently diagnosed right triple negative breast cancer. Subsequent breast MRI showing 2 suspicious sites of non mass enhancement within each breast (4 sites total) for which MRI guided biopsies were recommended. Patient presents today for the MRI guided biopsies. EXAM: MRI GUIDED CORE NEEDLE BIOPSY OF THE BILATERAL BREAST TECHNIQUE: Multiplanar, multisequence MR imaging of the bilateral breasts was performed both before and after administration of intravenous contrast. CONTRAST:  9m MULTIHANCE GADOBENATE DIMEGLUMINE 529 MG/ML IV SOLN COMPARISON:  Previous exams.  FINDINGS: I met with the patient, and we discussed the procedure of MRI guided biopsy, including risks, benefits, and alternatives. Specifically, we discussed the risks of infection, bleeding, tissue injury, clip migration, and inadequate sampling. Informed, written consent was given. The usual time out protocol was performed immediately prior to the procedure. Left breast: Using sterile technique, 1% Lidocaine, MRI guidance, and a 9 gauge vacuum assisted device, biopsy was performed of linear non mass enhancement within the upper inner quadrant of the left breast, at anterior depth, using a lateral approach. At the conclusion of the procedure, a cylinder shaped tissue marker clip was deployed into the biopsy cavity. Next, using sterile technique, 1% lidocaine, MRI guidance, and a 9 gauge vacuum assisted device, biopsy was performed of non mass enhancement within the upper-outer quadrant of the left breast, at middle depth, using a lateral  approach. At the conclusion of the procedure, a barbell shaped tissue marker clip was deployed into the biopsy cavity. Right breast: Next, using sterile technique, 1% lidocaine, MRI guidance, and a 9 gauge vacuum assisted device, biopsy was performed of the focal non mass enhancement within the lower inner quadrant of the right breast, at anterior depth, using a lateral approach. At the conclusion of the procedure, a cylinder shaped tissue marker clip was deployed into the biopsy cavity. Next, using sterile technique, 1% lidocaine, MRI guidance, and a 9 gauge vacuum assisted device, biopsy was performed of the linear non mass enhancement within the lower outer quadrant of the right breast, at posterior depth, using a lateral approach. At the conclusion of the procedure, a barbell shaped tissue clip was deployed in the biopsy cavity. Follow-up 2-view mammogram was performed and dictated separately. IMPRESSION: MRI guided biopsy of 2 sites of abnormal non mass enhancement within  each breast (4 total sites), as detailed above. No apparent complications. Electronically Signed: By: Franki Cabot M.D. On: 04/11/2017 11:10    ELIGIBLE FOR AVAILABLE RESEARCH PROTOCOL:UPBEAT trial --patient refused  ASSESSMENT: 51 y.o. [place] woman status post right breast upper inner quadrant biopsy March 21, 2017 for a clinical T2 N0  Stage IIB invasive ductal carcinoma, grade 3, triple negative, with an MIB-1 of 80%.  (a) biopsy of 2 additional suspicious areas in the right breast and one in the left breast scheduled for April 11, 2017  (1) neoadjuvant chemotherapy will consist of doxorubicin and cyclophosphamide in dose dense fashion x4, followed by paclitaxel/carboplatin weekly x12  (2) definitive surgery to follow  (3) adjuvant radiation as appropriate  (4) genetics testing scheduled for 04/22/2017, results pending  PLAN:  Aleathea is doing well today.  She tolerated treatment with minimal issues.  Her WBC are normal today in addition to her [place].  I reviewed these results with her in detail.  Her CMET is pending.  She is struggling with the globus sensation that started after taking Dexamethasone.  It is not improving with the Pepcid.  I sent in Omeprazole 66m PO BID for her to start tonight.  I reviewed with her to take in first thing in the morning on an empty stomach and then at night.  We reviewed food and reflux recommendations as well.  After chemotherapy, she is going to take Dexamethasone 417mpo bid instead of 59m92m If it persists, she may need barium swallow, or further GI evaluation.    She will see us [place] each treatment and on each day 8 from the cycles and I have entered all the appropriate orders  She knows to call for any other problems that may develop before her next visit.  A total of (30) minutes of face-to-face time was spent with this patient with greater than 50% of that time in counseling and care-coordination.    LinWilber BihariP  05/01/17  10:11 AM Medical Oncology and Hematology [facility] [place] [id number]. [phone] Fax. [phone]

## 2017-05-02 ENCOUNTER — Ambulatory Visit: Payer: Self-pay | Admitting: Genetics

## 2017-05-02 ENCOUNTER — Encounter: Payer: Self-pay | Admitting: Genetics

## 2017-05-02 ENCOUNTER — Telehealth: Payer: Self-pay | Admitting: Genetics

## 2017-05-02 DIAGNOSIS — Z803 Family history of malignant neoplasm of breast: Secondary | ICD-10-CM

## 2017-05-02 DIAGNOSIS — C50411 Malignant neoplasm of upper-outer quadrant of right female breast: Secondary | ICD-10-CM

## 2017-05-02 DIAGNOSIS — Z800 Family history of malignant neoplasm of digestive organs: Secondary | ICD-10-CM

## 2017-05-02 DIAGNOSIS — Z1379 Encounter for other screening for genetic and chromosomal anomalies: Secondary | ICD-10-CM

## 2017-05-02 DIAGNOSIS — Z8051 Family history of malignant neoplasm of kidney: Secondary | ICD-10-CM

## 2017-05-02 DIAGNOSIS — Z171 Estrogen receptor negative status [ER-]: Principal | ICD-10-CM

## 2017-05-02 NOTE — Progress Notes (Signed)
HPI: Jennifer Beck was previously seen in the [place] clinic on 04/22/2017 due to a personal and family history of breast cancer and concerns regarding a hereditary predisposition to cancer. Please refer to our prior cancer genetics clinic note for more information regarding Jennifer Beck, social and family histories, and our assessment and recommendations, at the time. Jennifer Beck recent genetic test results were disclosed to her, as well as recommendations warranted by these results. These results and recommendations are discussed in more detail below.  CANCER HISTORY:    Malignant neoplasm of upper-outer quadrant of right breast in female, estrogen receptor negative ([place])   03/26/2017 Initial Diagnosis    Malignant neoplasm of upper-outer quadrant of right breast in female, estrogen receptor negative ([place])      05/02/2017 Genetic Testing    The Jennifer Beck had genetic testing due to a personal history of breast cancer and a family history of breast, kidney, and colon cancer. There was also a family history of a CHEK2 mutation identified in Jennifer Beck maternal Beck.   The Common Hereditary Cancer Panel + Renal/Urinary Tract Cancer Panel was ordered (60 genes). APC, ATM, AXIN2, BAP1, BARD1, BMPR1A, BRCA1, BRCA2, BRIP1, BUB1B, CDC73, CDH1, CDK4, CDKN1C, CDKN2A (p14ARF),CDKN2A (p16INK4a), CEP57, CHEK2, CTNNA1, DICER1, DIS3L2, EPCAM*, FH, FLCN, GPC3, GREM1*, KIT, MEN1, MET, MLH1, MSH2, MSH3, MSH6, MUTYH, NBN, NF1, PALB2, PDGFRA, PMS2, POLD1, POLE, PTEN, RAD50, RAD51C, RAD51D, SDHB, SDHC, SDHD, SMAD4, SMARCA4, SMARCB1, STK11, TP53, TSC1, TSC2, VHL, WT1. The following genes were evaluated for sequence changes only: HOXB13*, MITF*, NTHL1*, SDHA NTHL1*, SDHA  Results: No pathogenic variants were identified.  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  The familial CHEK2 mutation WAS NOT detected.  The date of this test report is 05/02/2017.         FAMILY HISTORY:  We obtained a detailed, 4-generation family history.  Significant diagnoses are listed below: Family History  Problem Relation Age of Onset  . Hypertension Father   . Diabetes Father   . Other Father        stomach mass suspicious for ca, never bx  . Coronary artery disease Mother   . Kidney cancer Mother 79  . Coronary artery disease Maternal Grandfather   . Colon cancer Maternal Grandfather 61       recurrence  . Coronary artery disease Maternal Uncle   . Coronary artery disease Maternal Uncle   . Diabetes Maternal Uncle   . Colon cancer Maternal Grandmother 30  . Breast cancer Beck 34       had GT- CHEK2+  . Cervical cancer Maternal Beck 35  . Diabetes Maternal Beck   . Alzheimer's disease Paternal Grandmother   . Colon cancer Paternal Grandfather 33  . Breast cancer Paternal Beck   . Breast cancer Paternal Beck   . Breast cancer Other 63   Ms. Berres has a 31 year-old daughter with no history of cancer.  She has a 82 year-old brother with no history of cancer.  Her brother has a 17 year-old son and a 52 year-old daughter.    Jennifer Beck father died at the age of 64 due to dementia.  Prior to his death, a stomach mass was found that was suspicious for cancer.  This was never biopsied and followed up as he was already in hospice at this time.  Jennifer Beck has 1 full paternal uncle who died in an accident.  He had 3 daughters and 3  sons who are in their 54's and 50's with no history of cancer.  Jennifer Beck also reports that she has several paternal half- aunts and uncles (through paternal grandmother).  She does not know a lot of information about them, but knows that 2 paternal half-aunts had breast cancer.  The age of diagnosis is unknown.  Jennifer Beck paternal grandfather died of colon cancer in his 92's and her paternal grandmother died in her 27's of Alzheimer's disease.    Jennifer Beck mother was diagnosed with kidney cancer at 73 and is now 57.  She reports  that her mother [place] and believes the well water was contaminated with chemicals from the farming.   Jennifer Beck has a maternal Beck who was diagnosed with cervical cancer in her 71's/40's and had no children.  Jennifer Beck has a maternal uncle who died in his late 98's of diabetes related complications. He had a daughter and 2 sons.  The daughter (pateint's Beck) had breast cancer at 63 and had genetic testing.  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  (Copy was made of this test report in genetics S drive).  Ms. Mckillop maternal grandfather died of a heart attack in his 68's and her maternal grandmother was diagnosed with colon cancer at 73.  This recurred at 25.  She had a sister (Jennifer Beck;s great Beck) with breast cancer diagnosed in her 73's.    Jennifer maternal ancestors are of Caucasian descent, and paternal ancestors are of Caucasian descent. There is no reported Ashkenazi Jewish ancestry. There is no known consanguinity.  GENETIC TEST RESULTS: Genetic testing performed through [facility] Common Hereditary Cancers panel + Renal/Urinary Tract Cancers Panel (60 genes) reported out on 05/02/2017 showed no pathogenic mutations.   The following genes were evaluated for sequence changes and exonic deletions/duplications: APC, ATM, AXIN2, BAP1, BARD1, BMPR1A, BRCA1, BRCA2, BRIP1, BUB1B, CDC73, CDH1, CDK4, CDKN1C, CDKN2A (p14ARF), CDKN2A (p16INK4a), CEP57, CHEK2, CTNNA1, DICER1, DIS3L2, EPCAM*, FH, FLCN, GPC3, GREM1*, KIT, MEN1, MET, MLH1, MSH2, MSH3, MSH6, MUTYH, NBN, NF1, PALB2, PDGFRA, PMS2, POLD1, POLE, PTEN, RAD50, RAD51C, RAD51D, SDHB, SDHC, SDHD, SMAD4, SMARCA4, SMARCB1, STK11, TP53, TSC1, TSC2, VHL, WT1. The following genes were evaluated for sequence changes only: HOXB13*, MITF*, NTHL1*, SDHA  A variant of uncertain significance (VUS) in a gene called POLD1 was also noted. c.2953C>T (p.Arg985Trp)  The test report will be scanned into EPIC and will  be located under the Molecular Pathology section of the Results Review tab.A portion of the result report is included below for reference.      We discussed with Ms. Mondry that because current genetic testing is not perfect, it is possible there may be a gene mutation in one of these genes that current testing cannot detect, but that chance is small. We also discussed, that there could be another gene that has not yet been discovered, or that we have not yet tested, that is responsible for the cancer diagnoses in the family. It is also possible there is a hereditary cause for the cancer in the family that Ms. Larsh did not inherit and therefore was not identified in her testing.  Therefore, it is important to remain in touch with cancer genetics in the future so that we can continue to offer Ms. Donnellan the most up to date genetic testing.   Regarding the VUS in POLD1: At this time, it is unknown if this variant is associated with increased cancer risk  or if this is a normal finding, but most variants such as this get reclassified to being inconsequential. It should not be used to make medical management decisions. With time, we suspect the lab will determine the significance of this variant, if any. If we do learn more about it, we will try to contact Ms. Mcnab to discuss it further. However, it is important to stay in touch with [place] periodically and keep the address and phone number up to date.  ADDITIONAL GENETIC TESTING: We discussed with Ms. Steinhoff that there are other genes that are associated with increased cancer risk that can be analyzed. The laboratories that offer this testing look at these additional genes via a hereditary cancer gene panel. Should Ms. Podgorski wish to pursue additional genetic testing, we are happy to discuss and coordinate this testing, at any time.    CANCER SCREENING RECOMMENDATIONS:  Ms. Pelland test result is negative (normal).  She does not have the familial CHEK2 c.1100delC  mutation.  This means that we have not identified a hereditary cause for her personal history of cancer at this time.   While reassuring, this does not definitively rule out a hereditary basis for her cancer. It is still possible that there could be genetic mutations that are undetectable by current technology, or genetic mutations in genes that have not been tested or identified to increase cancer risk.   Therefore, Ms. Covalt was advised to continue following the cancer screening guidelines provided by her primary healthcare providers. Other factors such as her personal and family history may still affect her cancer risk.    RECOMMENDATIONS FOR FAMILY MEMBERS: Women in this family might be at some increased risk of developing cancer, over the general population risk, simply due to the family history of cancer. We recommended women in this family have a yearly mammogram beginning at age 75, or 44 years younger than the earliest onset of cancer, an annual clinical breast exam, and perform monthly breast self-exams. Women in this family should also have a gynecological exam as recommended by their primary provider. All family members should have a colonoscopy by age 58.  All family members should inform their physicians about the family history of cancer so their doctors can make the most appropriate screening recommendations for them.  We recommend Ms. Magill's daughter tell her doctors about the family history of cancer and start annual breast mammography by age 19.   Based on Ms. Lor's family history, we recommended all maternal realtives, have genetic counseling and testing.  There is still a known CHEK2 pathogenic variant in Ms. Weston maternal Beck, and while her testing was negative all other maternal relatives are at risk to carry this familial mutation.  Ms. Cassarino will let us know if we can be of any assistance in coordinating genetic counseling and/or testing for these family members.   FOLLOW-UP:  Lastly, we discussed with Ms. Flesch that cancer genetics is a rapidly advancing field and it is possible that new genetic tests will be appropriate for her and/or her family members in the future. We encouraged her to remain in contact with cancer genetics on an annual basis so we can update her personal and family histories and let her know of advances in cancer genetics that may benefit this family.   Our contact number was provided. Ms. Antwi questions were answered to her satisfaction, and she knows she is welcome to call us at anytime with additional questions or concerns.   Ferol Luz, MS  Genetic Counselor [email or web address]

## 2017-05-02 NOTE — Telephone Encounter (Signed)
Revealed negative genetic testing.  Revealed that a VUS in POLD1 was identified.  Jennifer Beck does not have the familial CHEK2 mutation.    This normal result is reassuring and indicates that it is unlikely Jennifer Beck's cancer is due to a hereditary cause.  It is unlikely that there is an increased risk of another cancer due to a mutation in one of these genes.  However, genetic testing is not perfect, and cannot definitively rule out a hereditary cause.  It will be important for her to keep in contact with genetics to learn if any additional testing may be needed in the future.   We discussed that family history can still increase cancer risk even with negative test results.  I recommended her mother and all other maternal relatives have genetic testing for the CHEK2 familial mutation.  Her daughter is recommended to start annual breast screening at 49 years of age.

## 2017-05-03 ENCOUNTER — Telehealth: Payer: Self-pay | Admitting: *Deleted

## 2017-05-03 MED ORDER — FIRST-DUKES MOUTHWASH MT SUSP: OROMUCOSAL | 3 refills | Status: DC

## 2017-05-03 NOTE — Telephone Encounter (Signed)
This RN received VM from pt stating she has onset of " like the roof of my mouth the skin is loose "  Per message she stated " there are not any sores "  This RN attempted to reach pt and obtained identified VM - message left informing pt MMW will be called in to her pharmacy. Requested return call if she develops sores or notices a white film coating in her mouth.  Prescription call in to her pharmacy.

## 2017-05-07 NOTE — Progress Notes (Signed)
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, set her up on 03/18/2017 for a unilateral right diagnostic mammography with ultrasonography, showing: Beck density D. The palpable right Beck mass at 12:30 was indeterminate, and biopsy was warranted at that time. No evidence of right axillary lymphadenopathy was noted. Biopsy of the lesion in question on 03/21/2017 at the right Beck 12:30 position showed ([id number])  invasive ductal carcinoma with lymphovascular invasion.  The tumor was grade 3, triple negative, with an MIB-1 of 80%.  She is accompanied by her husband, [name] to the office today. She reports that she is doing well overall. She initially felt a lump in September in the shower and notes that she doesn't complete monthly self Beck exams. She had a routine mammogram in March 2018 at [facility] and she is followed by Dr. [name].  As far as surgeries, she has had two laparoscopic procedures for endometriosis as well as a bilateral tubal ligation. She still has occasional cramping, painful ovulation, and vaginal spotting that comes and goes. She has had cyst  to her Beck before that have been evaluated and ruled as negative. She notes that she still has her tonsils, gallbladder, and appendix. She has a prior hx of pericarditis in 2003 that she notes was brought onby a virus. She has since been cleared by her prior Cardiologist.   She has a Restaurant manager, fast food, [name] that she sees every 5 weeks for maintenance of her neck and back issues. She has a dermatologist, [name] at [facility] and she has had an biopsy of an area from her left chest that resulted as basal cell carcinoma in 2015. She denies having a GI specialist, Cardiologist, or Orthopedist at this time. She denies prior history of seizures, migraines, acid reflux, asthma, emphysema, palpitations or GI issues.    [name] is as detailed below.  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 Today is day 1 cycle 3 of 4 planned cycles of cyclophosphamide and doxorubicin given every 14 days. She reports that she had some heart burn and was given omeprazole that she was taking twice a day at first, but she had some "fisting" in her throat and cramping in her right leg. She notes that she swallowing and throat issues go away after about 2 days. She reports that she will be taking the omeprazole and the decadron once per day at night. She notes that if she feels any nausea, she will take the compazine.    REVIEW OF SYSTEMS:  Jennifer Beck reports that she has lost most of her hair. She notes that for the most part she is tolerating her treatment well, other than noticing some "fisting" sensations  in her throat. She notes that her gets tired in the eveings. She reports that on mondays and tuesdays, she feels some pain and soreness in her collar bone, which she aids with tylenol. After Wednesday she feels better. She reports that she has some sinus drainage along which a small  amount of blood when she blows her nose in the morning. She is taking suddafed and claratin. She notes that this could be due the the cold, dry air. She got a humidifier to aid with this issue. She denies unusual headaches, visual changes, nausea, vomiting, or dizziness. There has been no unusual cough, phlegm production, or pleurisy. This been no change in bowel or bladder habits. She denies unexplained fatigue or unexplained weight loss, bleeding, rash, or fever. A detailed review of systems was otherwise stable.    PAST MEDICAL HISTORY: Past Medical History:  Diagnosis Date  . Chest pain   . Complication of anesthesia   . Endometriosis   . Family history of Beck cancer 04/22/2017  . Family history of colon cancer 04/22/2017  . Family history of kidney cancer 04/22/2017  . Fatigue   . GERD (gastroesophageal reflux disease)   . GI bleed    caused by ischemic colitis following an episode of hypertension  . Heart palpitations   . Hx of echocardiogram    a. echo 9/13:  EF 55-60%, Gr 2 diast dysfn  . Hypercholesterolemia   . Ischemic colitis ([place])   . 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 Myocarditis ([place])   . Pericarditis   . PONV (postoperative nausea and vomiting)   . SOB (shortness of breath)     PAST SURGICAL HISTORY: Past Surgical History:  Procedure Laterality Date  . CARDIAC CATHETERIZATION  08/26/2001   EF of 60% --- smooth & normal coronary arteries -- there is a Averhart motion defect consistent with posterolateral MI -- she quite possibly had a coronary spasm -- she may have some pericarditis secondary to her MI   . DILATION AND CURETTAGE OF UTERUS  10/15/2003   Uterine enlargement, menorrhagia  . DILATION AND CURETTAGE OF UTERUS  05/27/2002   Dysfunctional uterine bleeding  . LAPAROSCOPY    . NOVASURE ABLATION  10/15/2003   for management of her extreme menorrhagi  . PORTACATH PLACEMENT Right 04/02/2017    Procedure: INSERTION PORT-A-CATH;  Surgeon: [name], MD;  Location: [facility];  Service: General;  Laterality: Right;  . TUBAL LIGATION  09/2000   bilateral    FAMILY HISTORY Family History  Problem Relation Age of Onset  . Hypertension Father   . Diabetes Father   . Other Father        stomach mass suspicious for ca, never bx  . Coronary artery disease Mother   . Kidney cancer Mother 54  . Coronary artery disease Maternal Grandfather   . Colon cancer Maternal Grandfather 61       recurrence  . Coronary artery disease Maternal Uncle   . Coronary artery disease Maternal Uncle   . Diabetes Maternal Uncle   . Colon cancer Maternal Grandmother 60  . Beck cancer Cousin 27       had GT- CHEK2+  . Cervical cancer Maternal Aunt 35  . Diabetes Maternal Aunt   . Alzheimer's disease Paternal Grandmother   . Colon cancer Paternal Grandfather 65  . Beck cancer Paternal Aunt   . Beck cancer Paternal Aunt   . Beck cancer  Other 50   Her father died last Feb 13, 2016 at age 36 just 3 week shy of his 83rd birthday. Her mother is 17 years old as of October 2018. Patient has one brother and no sisters. Her mother was diagnosed with kidney cancer at age 4 with a nephrectomy following. Her maternal grandmother was diagnosed with colon cancer at age 23.  The patient paternal grandfather was diagnosed with colon cancer in his 77's. She has paternal cousins through her fathers half-sisters that have been diagnosed with Beck cancer, she is unsure of the number of Beck cancer occurrences due to the distance . A maternal cousin was diagnosed with Beck cancer at age 76 and she had genetic testing. The patient does have a copy of this testing as well as her PCP.  This was not available for review on the nasal  GYNECOLOGIC HISTORY:  No LMP recorded. Patient has had an ablation.   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 Her husband [name] is a Airline pilot. Her daughter [name] is 32 and currently a sophomore at [employer]. 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  The patient attends [employer]. She lives in [place].      ADVANCED DIRECTIVES:    HEALTH MAINTENANCE: Social History   Tobacco Use  . Smoking status: Never Smoker  . Smokeless tobacco: Never Used  Substance Use Topics  . Alcohol use: Yes    Comment: Occasionally drinks wine  . Drug use: No     Colonoscopy: Yes, 2003  PAP: March 2018   Bone density: N/A   Allergies  Allergen Reactions  . Codeine Itching  . Biaxin [Clarithromycin]     Heart Burn    Current Outpatient Medications  Medication Sig Dispense Refill  . acetaminophen (TYLENOL) 325 MG tablet Take 650 mg by mouth every 6 (six) hours as needed.    . ALPRAZolam (XANAX) 0.25 MG tablet Take 0.25 mg by mouth daily as needed for anxiety.    . Cholecalciferol (VITAMIN D) 2000 units CAPS Take 2,000 Units by mouth daily.    [name] dexamethasone (DECADRON) 4 MG tablet Take 2 tablets by mouth once a day on the day after chemotherapy and then take 2 tablets two times a day for 2 days. Take with food. 30 tablet 1  . Diphenhyd-Hydrocort-Nystatin (FIRST-DUKES MOUTHWASH) SUSP 5-10 ml qid prn swish swallow or spit 240 mL 3  . Evening Primrose Oil 1000 MG CAPS Take 1 capsule by mouth every evening.     . famotidine (PEPCID) 10 MG tablet Take 10 mg by mouth 2 (two) times daily.    . fluocinonide cream (LIDEX) 1.85 % Apply 1 application topically 2 (two) times daily as needed.     [name] glucosamine-chondroitin 500-400 MG tablet Take 1 tablet by mouth daily.     [name] ibuprofen (ADVIL,MOTRIN) 200 MG tablet Take 400 mg by mouth every 8 (eight) hours as needed for headache or  mild pain.     [name] lidocaine-prilocaine (EMLA) cream Apply to affected area once 30 g 3  . LORazepam (ATIVAN) 0.5 MG tablet Take 1 tablet (0.5 mg total) by mouth at bedtime as needed (Nausea or vomiting). [place]  tablet 0  . Melatonin 5 MG TABS Take 5 mg by mouth at bedtime as needed (sleep).     . Multiple Minerals-Vitamins (CALCIUM-MAGNESIUM-ZINC) TABS Take 1 tablet by mouth daily.     . Multiple Vitamins-Minerals (MULTIVITAMINS THER. W/MINERALS) TABS Take 1 tablet by mouth daily.     . Omega-3 Fatty Acids (FISH OIL) 1000 MG CAPS Take 1,000 mg by mouth 2 (two) times a week.     [name] omeprazole (PRILOSEC) 40 MG capsule Take 1 capsule (40 mg total) by mouth 2 (two) times daily. 60 capsule 0  . ondansetron (ZOFRAN) 8 MG tablet Take 1 tablet (8 mg total) by mouth every 8 (eight) hours as needed for nausea or vomiting. 20 tablet 3  . Polyethyl Glycol-Propyl Glycol (SYSTANE OP) Apply 1 drop to eye 2 (two) times daily.    . prochlorperazine (COMPAZINE) 10 MG tablet Take 1 tablet (10 mg total) by mouth every 6 (six) hours as needed (Nausea or vomiting). 30 tablet 1  . VITAMIN E PO Take 1 tablet by mouth daily.      No current facility-administered medications for this visit.     OBJECTIVE: Middle-aged white woman who appears younger than stated age  There were no vitals filed for this visit.   There is no height or weight on file to calculate BMI.   Wt Readings from Last 3 Encounters:  05/01/17 161 lb 11.2 oz (73.3 kg)  04/25/17 161 lb 11.2 oz (73.3 kg)  04/17/17 161 lb (73 kg)      ECOG FS:1  Sclerae unicteric, EOMs intact Oropharynx clear and moist No cervical or supraclavicular adenopathy Lungs no rales or rhonchi Heart regular rate and rhythm Abd soft, nontender, positive bowel sounds MSK no focal spinal tenderness, no upper extremity lymphedema Neuro: nonfocal, well oriented, appropriate affect Breasts: Deferred      LAB RESULTS:  CMP     Component Value Date/Time   NA 136  05/01/2017 0915   K 4.1 05/01/2017 0915   CL 106 02/05/2012 1742   CO2 23 05/01/2017 0915   GLUCOSE 91 05/01/2017 0915   BUN 10.0 05/01/2017 0915   CREATININE 0.6 05/01/2017 0915   CALCIUM 8.5 05/01/2017 0915   PROT 6.3 (L) 05/01/2017 0915   ALBUMIN 3.5 05/01/2017 0915   AST 11 05/01/2017 0915   ALT 10 05/01/2017 0915   ALKPHOS 104 05/01/2017 0915   BILITOT 0.31 05/01/2017 0915    No results found for: TOTALPROTELP, ALBUMINELP, A1GS, A2GS, BETS, BETA2SER, GAMS, MSPIKE, SPEI  No results found for: KPAFRELGTCHN, LAMBDASER, KAPLAMBRATIO  Lab Results  Component Value Date   WBC 19.4 (H) 05/09/2017   NEUTROABS 17.0 (H) 05/09/2017   HGB 12.0 05/09/2017   HCT 34.9 05/09/2017   MCV 95.6 05/09/2017   PLT 222 05/09/2017    _0 @  No results found for: LABCA2  No components found for: [id number]  No results for input(s): INR in the last 168 hours.  No results found for: LABCA2  No results found for: YKZ993  No results found for: TTS177  No results found for: LTJ030  No results found for: CA2729  No components found for: HGQUANT  No results found for: CEA1 / No results found for: CEA1   No results found for: AFPTUMOR  No results found for: CHROMOGRNA  No results found for: PSA1  Appointment on 05/09/2017  Component Date Value Ref Range Status  . WBC 05/09/2017 19.4* 3.9 - 10.3 10e3/uL Final  . NEUT# 05/09/2017 17.0* 1.5 -  6.5 10e3/uL Final  . HGB 05/09/2017 12.0  11.6 - 15.9 g/dL Final  . HCT 05/09/2017 34.9  34.8 - 46.6 % Final  . Platelets 05/09/2017 222  145 - 400 10e3/uL Final  . MCV 05/09/2017 95.6  79.5 - 101.0 fL Final  . MCH 05/09/2017 32.9  25.1 - 34.0 pg Final  . MCHC 05/09/2017 34.4  31.5 - 36.0 g/dL Final  . RBC 05/09/2017 3.65* 3.70 - 5.45 10e6/uL Final  . RDW 05/09/2017 12.6  11.2 - 14.5 % Final  . lymph# 05/09/2017 1.2  0.9 - 3.3 10e3/uL Final  . MONO# 05/09/2017 1.0* 0.1 - 0.9 10e3/uL Final  . Eosinophils Absolute 05/09/2017 0.0   0.0 - 0.5 10e3/uL Final  . Basophils Absolute 05/09/2017 0.1  0.0 - 0.1 10e3/uL Final  . NEUT% 05/09/2017 87.8* 38.4 - 76.8 % Final  . LYMPH% 05/09/2017 6.4* 14.0 - 49.7 % Final  . MONO% 05/09/2017 5.1  0.0 - 14.0 % Final  . EOS% 05/09/2017 0.2  0.0 - 7.0 % Final  . BASO% 05/09/2017 0.5  0.0 - 2.0 % Final    (this displays the last labs from the last 3 days)  No results found for: TOTALPROTELP, ALBUMINELP, A1GS, A2GS, BETS, BETA2SER, GAMS, MSPIKE, SPEI (this displays SPEP labs)  No results found for: KPAFRELGTCHN, LAMBDASER, KAPLAMBRATIO (kappa/lambda light chains)  No results found for: HGBA, HGBA2QUANT, HGBFQUANT, HGBSQUAN (Hemoglobinopathy evaluation)   No results found for: LDH  No results found for: IRON, TIBC, IRONPCTSAT (Iron and TIBC)  No results found for: FERRITIN  Urinalysis No results found for: COLORURINE, APPEARANCEUR, LABSPEC, PHURINE, GLUCOSEU, HGBUR, BILIRUBINUR, KETONESUR, PROTEINUR, UROBILINOGEN, NITRITE, LEUKOCYTESUR   STUDIES: Mm Clip Placement Left  Result Date: 04/11/2017 CLINICAL DATA:  Status post bilateral MRI guided Beck biopsies, 2 sites of non mass enhancement within each Beck (4 total sites). Recently diagnosed right Beck cancer. EXAM: DIAGNOSTIC BILATERAL MAMMOGRAM POST MRI BIOPSY COMPARISON:  Previous exam(s). FINDINGS: Mammographic images were obtained following MRI guided biopsy of 4 sites of abnormal non mass enhancement within the bilateral Beck (2 sites for Beck). All clips appear appropriately positioned. IMPRESSION: 1. Biopsy site 1: Cylinder shaped clip is appropriately positioned within the upper inner quadrant of the left Beck. 2. Biopsy site 2: Barbell shaped clip is appropriately positioned within the upper outer quadrant of the left Beck. 3. Biopsy site 3: Cylinder shaped clip is appropriately positioned within the lower inner quadrant of the right Beck. 4. Biopsy site 4: Barbell shaped clip is appropriately position  within the lower outer quadrant of the right Beck. A ribbon shaped clip within the right Beck corresponds to the site of a recent biopsy-proven Beck cancer. Final Assessment: Post Procedure Mammograms for Marker Placement Electronically Signed   By: Franki Cabot M.D.   On: 04/11/2017 11:24   Mm Clip Placement Right  Result Date: 04/11/2017 CLINICAL DATA:  Status post bilateral MRI guided Beck biopsies, 2 sites of non mass enhancement within each Beck (4 total sites). Recently diagnosed right Beck cancer. EXAM: DIAGNOSTIC BILATERAL MAMMOGRAM POST MRI BIOPSY COMPARISON:  Previous exam(s). FINDINGS: Mammographic images were obtained following MRI guided biopsy of 4 sites of abnormal non mass enhancement within the bilateral Beck (2 sites for Beck). All clips appear appropriately positioned. IMPRESSION: 1. Biopsy site 1: Cylinder shaped clip is appropriately positioned within the upper inner quadrant of the left Beck. 2. Biopsy site 2: Barbell shaped clip is appropriately positioned within the upper outer quadrant of the left Beck. 3. Biopsy  site 3: Cylinder shaped clip is appropriately positioned within the lower inner quadrant of the right Beck. 4. Biopsy site 4: Barbell shaped clip is appropriately position within the lower outer quadrant of the right Beck. A ribbon shaped clip within the right Beck corresponds to the site of a recent biopsy-proven Beck cancer. Final Assessment: Post Procedure Mammograms for Marker Placement Electronically Signed   By: Franki Cabot M.D.   On: 04/11/2017 11:24   Mr Aundra Millet Beck Bx W Loc Dev 1st Lesion Image Bx Spec Mr Guide  Addendum Date: 04/12/2017   ADDENDUM REPORT: 04/12/2017 10:58 ADDENDUM: PATHOLOGY: 1.  Left Beck, upper inner quadrant - hemorrhage. 2.  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  Right Beck, lower inner quadrant - grade 2 IMC and MCIS 4. Right Beck, lower outer quadrant- MCIS, sclerosing adenosis,  [facility] CONCORDANT: 1.  Discordant - surgical excision recommended 2.  Concordant - surgical excision recommended. 3.  Concordant 4.  Concordant I telephoned the patient on 04/12/2017 at 10 a.m. and discussed these results and the recommendations stated below. All questions were answered. The patient denies significant pain or bleeding from the biopsy site. Patient was instructed to contact the [place] if pain increased or bleeding reoccurred. RECOMMENDATION: Surgical excision for all biopsy sites if Beck conservation is considered. Also, there is an additional focus of non mass enhancement within the upper left Beck, 12 o'clock axis region, between the 2 biopsy sites, for which additional MRI guided biopsy is recommended if Beck conservation is considered for the left Beck. Findings and recommendations discussed with Dr. Lucia Gaskins. Electronically Signed   By: Franki Cabot M.D.   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 Subsequent Beck MRI showing 2 suspicious sites of non mass enhancement within each Beck (4 sites total) for which MRI guided biopsies were recommended. Patient presents today for the MRI guided biopsies. EXAM: MRI GUIDED CORE NEEDLE BIOPSY OF THE BILATERAL Beck TECHNIQUE: Multiplanar, multisequence MR imaging of the bilateral breasts was performed both before and after administration of intravenous contrast. CONTRAST:  63m MULTIHANCE GADOBENATE DIMEGLUMINE 529 MG/ML IV SOLN COMPARISON:  Previous exams. FINDINGS: I met with the patient, and we discussed the procedure of MRI guided biopsy, including risks, benefits, and alternatives. Specifically, we discussed the risks of infection, bleeding, tissue injury, clip migration, and inadequate sampling. Informed, written consent was given. The usual time out protocol was performed immediately prior to the procedure. Left Beck: Using  sterile technique, 1% Lidocaine, MRI guidance, and a 9 gauge vacuum assisted device, biopsy was performed of linear non mass enhancement within the upper inner quadrant of the left Beck, at anterior depth, using a lateral approach. At the conclusion of the procedure, a cylinder shaped tissue marker clip was deployed into the biopsy cavity. Next, using sterile technique, 1% lidocaine, MRI guidance, and a 9 gauge vacuum assisted device, biopsy was performed of non mass enhancement within the upper-outer quadrant of the left Beck, at middle depth, using a lateral approach. At the conclusion of the procedure, a barbell shaped tissue marker clip was deployed into the biopsy cavity. Right Beck: Next, using sterile technique, 1% lidocaine, MRI guidance, and a 9 gauge vacuum assisted device, biopsy was performed of the focal non mass enhancement within the lower inner quadrant of the right Beck, at anterior depth, using a lateral approach. At the conclusion of the procedure, a cylinder shaped  tissue marker clip was deployed into the biopsy cavity. Next, using sterile technique, 1% lidocaine, MRI guidance, and a 9 gauge vacuum assisted device, biopsy was performed of the linear non mass enhancement within the lower outer quadrant of the right Beck, at posterior depth, using a lateral approach. At the conclusion of the procedure, a barbell shaped tissue clip was deployed in the biopsy cavity. Follow-up 2-view mammogram was performed and dictated separately. IMPRESSION: MRI guided biopsy of 2 sites of abnormal non mass enhancement within each Beck (4 total sites), as detailed above. No apparent complications. Electronically Signed: By: Franki Cabot M.D. On: 04/11/2017 11:10   Mr Aundra Millet Beck Bx W Loc Dev Ea Add Lesion Image Bx Spec Mr Guide  Addendum Date: 04/12/2017   ADDENDUM REPORT: 04/12/2017 10:58 ADDENDUM: PATHOLOGY: 1.  Left Beck, upper inner quadrant - hemorrhage. 2.  Left Beck, upper outer quadrant  -ALH in sclerosing adenosis 3.  Right Beck, lower inner quadrant - grade 2 IMC and MCIS 4. Right Beck, lower outer quadrant- MCIS, sclerosing adenosis, [facility] CONCORDANT: 1.  Discordant - surgical excision recommended 2.  Concordant - surgical excision recommended. 3.  Concordant 4.  Concordant I telephoned the patient on 04/12/2017 at 10 a.m. and discussed these results and the recommendations stated below. All questions were answered. The patient denies significant pain or bleeding from the biopsy site. Patient was instructed to contact the [place] if pain increased or bleeding reoccurred. RECOMMENDATION: Surgical excision for all biopsy sites if Beck conservation is considered. Also, there is an additional focus of non mass enhancement within the upper left Beck, 12 o'clock axis region, between the 2 biopsy sites, for which additional MRI guided biopsy is recommended if Beck conservation is considered for the left Beck. Findings and recommendations discussed with Dr. Lucia Gaskins. Electronically Signed   By: Franki Cabot M.D.   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 Subsequent Beck MRI showing 2 suspicious sites of non mass enhancement within each Beck (4 sites total) for which MRI guided biopsies were recommended. Patient presents today for the MRI guided biopsies. EXAM: MRI GUIDED CORE NEEDLE BIOPSY OF THE BILATERAL Beck TECHNIQUE: Multiplanar, multisequence MR imaging of the bilateral breasts was performed both before and after administration of intravenous contrast. CONTRAST:  46m MULTIHANCE GADOBENATE DIMEGLUMINE 529 MG/ML IV SOLN COMPARISON:  Previous exams. FINDINGS: I met with the patient, and we discussed the procedure of MRI guided biopsy, including risks, benefits, and alternatives. Specifically, we discussed the risks of infection, bleeding, tissue injury, clip migration, and  inadequate sampling. Informed, written consent was given. The usual time out protocol was performed immediately prior to the procedure. Left Beck: Using sterile technique, 1% Lidocaine, MRI guidance, and a 9 gauge vacuum assisted device, biopsy was performed of linear non mass enhancement within the upper inner quadrant of the left Beck, at anterior depth, using a lateral approach. At the conclusion of the procedure, a cylinder shaped tissue marker clip was deployed into the biopsy cavity. Next, using sterile technique, 1% lidocaine, MRI guidance, and a 9 gauge vacuum assisted device, biopsy was performed of non mass enhancement within the upper-outer quadrant of the left Beck, at middle depth, using a lateral approach. At the conclusion of the procedure, a barbell shaped tissue marker clip was deployed into the biopsy cavity. Right Beck: Next, using sterile technique, 1% lidocaine, MRI guidance, and a 9 gauge vacuum assisted device, biopsy  was performed of the focal non mass enhancement within the lower inner quadrant of the right Beck, at anterior depth, using a lateral approach. At the conclusion of the procedure, a cylinder shaped tissue marker clip was deployed into the biopsy cavity. Next, using sterile technique, 1% lidocaine, MRI guidance, and a 9 gauge vacuum assisted device, biopsy was performed of the linear non mass enhancement within the lower outer quadrant of the right Beck, at posterior depth, using a lateral approach. At the conclusion of the procedure, a barbell shaped tissue clip was deployed in the biopsy cavity. Follow-up 2-view mammogram was performed and dictated separately. IMPRESSION: MRI guided biopsy of 2 sites of abnormal non mass enhancement within each Beck (4 total sites), as detailed above. No apparent complications. Electronically Signed: By: Franki Cabot M.D. On: 04/11/2017 11:10   Mr Jennifer Beck Beck Bx W Loc Dev 1st Lesion Image Bx Spec Mr Guide  Addendum Date:  04/12/2017   ADDENDUM REPORT: 04/12/2017 10:58 ADDENDUM: PATHOLOGY: 1.  Left Beck, upper inner quadrant - hemorrhage. 2.  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  Right Beck, lower inner quadrant - grade 2 IMC and MCIS 4. Right Beck, lower outer quadrant- MCIS, sclerosing adenosis, FCC CONCORDANT: 1.  Discordant - surgical excision recommended 2.  Concordant - surgical excision recommended. 3.  Concordant 4.  Concordant I telephoned the patient on 04/12/2017 at 10 a.m. and discussed these results and the recommendations stated below. All questions were answered. The patient denies significant pain or bleeding from the biopsy site. Patient was instructed to contact the Jennifer Beck if pain increased or bleeding reoccurred. RECOMMENDATION: Surgical excision for all biopsy sites if Beck conservation is considered. Also, there is an additional focus of non mass enhancement within the upper left Beck, 12 o'clock axis region, between the 2 biopsy sites, for which additional MRI guided biopsy is recommended if Beck conservation is considered for the left Beck. Findings and recommendations discussed with Dr. Lucia Gaskins. Electronically Signed   By: Franki Cabot M.D.   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 Subsequent Beck MRI showing 2 suspicious sites of non mass enhancement within each Beck (4 sites total) for which MRI guided biopsies were recommended. Patient presents today for the MRI guided biopsies. EXAM: MRI GUIDED CORE NEEDLE BIOPSY OF THE BILATERAL Beck TECHNIQUE: Multiplanar, multisequence MR imaging of the bilateral breasts was performed both before and after administration of intravenous contrast. CONTRAST:  69m MULTIHANCE GADOBENATE DIMEGLUMINE 529 MG/ML IV SOLN COMPARISON:  Previous exams. FINDINGS: I met with the patient, and we discussed the procedure of MRI  guided biopsy, including risks, benefits, and alternatives. Specifically, we discussed the risks of infection, bleeding, tissue injury, clip migration, and inadequate sampling. Informed, written consent was given. The usual time out protocol was performed immediately prior to the procedure. Left Beck: Using sterile technique, 1% Lidocaine, MRI guidance, and a 9 gauge vacuum assisted device, biopsy was performed of linear non mass enhancement within the upper inner quadrant of the left Beck, at anterior depth, using a lateral approach. At the conclusion of the procedure, a cylinder shaped tissue marker clip was deployed into the biopsy cavity. Next, using sterile technique, 1% lidocaine, MRI guidance, and a 9 gauge vacuum assisted device, biopsy was performed of non mass enhancement within the upper-outer quadrant of the left Beck, at middle depth, using a lateral approach. At the conclusion of  the procedure, a barbell shaped tissue marker clip was deployed into the biopsy cavity. Right Beck: Next, using sterile technique, 1% lidocaine, MRI guidance, and a 9 gauge vacuum assisted device, biopsy was performed of the focal non mass enhancement within the lower inner quadrant of the right Beck, at anterior depth, using a lateral approach. At the conclusion of the procedure, a cylinder shaped tissue marker clip was deployed into the biopsy cavity. Next, using sterile technique, 1% lidocaine, MRI guidance, and a 9 gauge vacuum assisted device, biopsy was performed of the linear non mass enhancement within the lower outer quadrant of the right Beck, at posterior depth, using a lateral approach. At the conclusion of the procedure, a barbell shaped tissue clip was deployed in the biopsy cavity. Follow-up 2-view mammogram was performed and dictated separately. IMPRESSION: MRI guided biopsy of 2 sites of abnormal non mass enhancement within each Beck (4 total sites), as detailed above. No apparent complications.  Electronically Signed: By: Franki Cabot M.D. On: 04/11/2017 11:10   Mr Jennifer Beck Bx W Loc Dev Ea Add Lesion Image Bx Spec Mr Guide  Addendum Date: 04/12/2017   ADDENDUM REPORT: 04/12/2017 10:58 ADDENDUM: PATHOLOGY: 1.  Left Beck, upper inner quadrant - hemorrhage. 2.  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  Right Beck, lower inner quadrant - grade 2 IMC and MCIS 4. Right Beck, lower outer quadrant- MCIS, sclerosing adenosis, FCC CONCORDANT: 1.  Discordant - surgical excision recommended 2.  Concordant - surgical excision recommended. 3.  Concordant 4.  Concordant I telephoned the patient on 04/12/2017 at 10 a.m. and discussed these results and the recommendations stated below. All questions were answered. The patient denies significant pain or bleeding from the biopsy site. Patient was instructed to contact the [facility] if pain increased or bleeding reoccurred. RECOMMENDATION: Surgical excision for all biopsy sites if Beck conservation is considered. Also, there is an additional focus of non mass enhancement within the upper left Beck, 12 o'clock axis region, between the 2 biopsy sites, for which additional MRI guided biopsy is recommended if Beck conservation is considered for the left Beck. Findings and recommendations discussed with Dr. Lucia Gaskins. Electronically Signed   By: Franki Cabot M.D.   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 Subsequent Beck MRI showing 2 suspicious sites of non mass enhancement within each Beck (4 sites total) for which MRI guided biopsies were recommended. Patient presents today for the MRI guided biopsies. EXAM: MRI GUIDED CORE NEEDLE BIOPSY OF THE BILATERAL Beck TECHNIQUE: Multiplanar, multisequence MR imaging of the bilateral breasts was performed both before and after administration of intravenous contrast. CONTRAST:  68m  MULTIHANCE GADOBENATE DIMEGLUMINE 529 MG/ML IV SOLN COMPARISON:  Previous exams. FINDINGS: I met with the patient, and we discussed the procedure of MRI guided biopsy, including risks, benefits, and alternatives. Specifically, we discussed the risks of infection, bleeding, tissue injury, clip migration, and inadequate sampling. Informed, written consent was given. The usual time out protocol was performed immediately prior to the procedure. Left Beck: Using sterile technique, 1% Lidocaine, MRI guidance, and a 9 gauge vacuum assisted device, biopsy was performed of linear non mass enhancement within the upper inner quadrant of the left Beck, at anterior depth, using a lateral approach. At the conclusion of the procedure, a cylinder shaped tissue marker clip was deployed into the biopsy cavity. Next, using sterile technique, 1% lidocaine, MRI guidance,  and a 9 gauge vacuum assisted device, biopsy was performed of non mass enhancement within the upper-outer quadrant of the left Beck, at middle depth, using a lateral approach. At the conclusion of the procedure, a barbell shaped tissue marker clip was deployed into the biopsy cavity. Right Beck: Next, using sterile technique, 1% lidocaine, MRI guidance, and a 9 gauge vacuum assisted device, biopsy was performed of the focal non mass enhancement within the lower inner quadrant of the right Beck, at anterior depth, using a lateral approach. At the conclusion of the procedure, a cylinder shaped tissue marker clip was deployed into the biopsy cavity. Next, using sterile technique, 1% lidocaine, MRI guidance, and a 9 gauge vacuum assisted device, biopsy was performed of the linear non mass enhancement within the lower outer quadrant of the right Beck, at posterior depth, using a lateral approach. At the conclusion of the procedure, a barbell shaped tissue clip was deployed in the biopsy cavity. Follow-up 2-view mammogram was performed and dictated separately.  IMPRESSION: MRI guided biopsy of 2 sites of abnormal non mass enhancement within each Beck (4 total sites), as detailed above. No apparent complications. Electronically Signed: By: Franki Cabot M.D. On: 04/11/2017 11:10    ELIGIBLE FOR AVAILABLE RESEARCH PROTOCOL:UPBEAT trial --patient refused  ASSESSMENT: 51 y.o. [place] woman status post right Beck upper inner quadrant biopsy March 21, 2017 for a clinical T2 N0  Stage IIB invasive ductal carcinoma, grade 3, triple negative, with an MIB-1 of 80%.  (a) biopsy of 2 additional suspicious areas in the right Beck and one in the left Beck scheduled for April 11, 2017  (1) neoadjuvant chemotherapy will consist of doxorubicin and cyclophosphamide in dose dense fashion x4, followed by paclitaxel/carboplatin weekly x12  (2) definitive surgery to follow  (3) adjuvant radiation as appropriate  (4) genetics testing 04/22/2017 through the common hereditary cancer panel plus renal/urinary tract cancel panel showed no deleterious mutations in APC, ATM, AXIN2, BAP1, BARD1, BMPR1A, BRCA1, BRCA2, BRIP1, BUB1B, CDC73, CDH1, CDK4, CDKN1C, CDKN2A (p14ARF), CDKN2A (p16INK4a), CEP57, CHEK2, CTNNA1, DICER1, DIS3L2, EPCAM*, FH, FLCN, GPC3, GREM1*, KIT, MEN1, MET, MLH1, MSH2, MSH3, MSH6, MUTYH, NBN, NF1, PALB2, PDGFRA, PMS2, POLD1, POLE, PTEN, RAD50, RAD51C, RAD51D, SDHB, SDHC, SDHD, SMAD4, SMARCA4, SMARCB1, STK11, TP53, TSC1, TSC2, VHL, WT1 The following genes were evaluated for sequence changes only: HOXB13*, MITF*, NTHL1*, SDHA  (a) there was a Variant of Uncertain Significance identified in POLD1, namely c.2953C>T (p.[id number])  PLAN:  Lauryl is tolerating her treatment remarkably well and will proceed to cycle #3 of 4 planned today.  She will see [place] again next week to troubleshoot problems.  This cycle she is only going to do 4 mg twice a day on the dexamethasone.  Hopefully that plus the omeprazole will take care of the swallowing  difficulty.  She does not seem to have had any thrush issues associated with this.  I refilled her Compazine and also wrote for ondansetron which she may take beginning on day 3 if nausea is a problem  She is already scheduled for a further visit in a week and for repeat treatment in 2 weeks.  That will be her last cycle of the current treatment.  Because she does not menstruate due to her prior D&C I am going to check an [facility] and estradiol level with the next lab draw to assess her current ovarian function  She is going to see me 05/30/2017.  Today we already started discussing the possible toxicities, side effects and  complications of paclitaxel which she will start June 06, 2017   Chauncey Cruel, MD  05/09/17 1:03 PM Medical Oncology and Hematology [facility] [address] [place], [place] [address] Tel. [phone]    Fax. [phone]  This document serves as a record of services personally performed by Lurline Del, MD. It was created on his behalf by Sheron Nightingale, a trained medical scribe. The creation of this record is based on the scribe's personal observations and the provider's statements to them.   I have reviewed the above documentation for accuracy and completeness, and I agree with the above.

## 2017-05-09 ENCOUNTER — Ambulatory Visit (HOSPITAL_BASED_OUTPATIENT_CLINIC_OR_DEPARTMENT_OTHER): Payer: 59 | Admitting: Oncology

## 2017-05-09 ENCOUNTER — Other Ambulatory Visit (HOSPITAL_BASED_OUTPATIENT_CLINIC_OR_DEPARTMENT_OTHER): Payer: 59

## 2017-05-09 ENCOUNTER — Ambulatory Visit (HOSPITAL_BASED_OUTPATIENT_CLINIC_OR_DEPARTMENT_OTHER): Payer: 59

## 2017-05-09 ENCOUNTER — Ambulatory Visit: Payer: 59

## 2017-05-09 ENCOUNTER — Encounter: Payer: Self-pay | Admitting: *Deleted

## 2017-05-09 ENCOUNTER — Other Ambulatory Visit: Payer: 59

## 2017-05-09 VITALS — BP 118/74 | HR 101 | Temp 98.6°F | Resp 18 | Ht 65.0 in | Wt 163.3 lb

## 2017-05-09 DIAGNOSIS — Z171 Estrogen receptor negative status [ER-]: Secondary | ICD-10-CM

## 2017-05-09 DIAGNOSIS — C50411 Malignant neoplasm of upper-outer quadrant of right female breast: Secondary | ICD-10-CM | POA: Diagnosis not present

## 2017-05-09 DIAGNOSIS — Z5111 Encounter for antineoplastic chemotherapy: Secondary | ICD-10-CM | POA: Diagnosis not present

## 2017-05-09 LAB — CBC WITH DIFFERENTIAL/PLATELET
BASO%: 0.5 % (ref 0.0–2.0)
Basophils Absolute: 0.1 10*3/uL (ref 0.0–0.1)
EOS%: 0.2 % (ref 0.0–7.0)
Eosinophils Absolute: 0 10*3/uL (ref 0.0–0.5)
HCT: 34.9 % (ref 34.8–46.6)
HEMOGLOBIN: 12 g/dL (ref 11.6–15.9)
LYMPH%: 6.4 % — AB (ref 14.0–49.7)
MCH: 32.9 pg (ref 25.1–34.0)
MCHC: 34.4 g/dL (ref 31.5–36.0)
MCV: 95.6 fL (ref 79.5–101.0)
MONO#: 1 10*3/uL — AB (ref 0.1–0.9)
MONO%: 5.1 % (ref 0.0–14.0)
NEUT%: 87.8 % — ABNORMAL HIGH (ref 38.4–76.8)
NEUTROS ABS: 17 10*3/uL — AB (ref 1.5–6.5)
Platelets: 222 10*3/uL (ref 145–400)
RBC: 3.65 10*6/uL — AB (ref 3.70–5.45)
RDW: 12.6 % (ref 11.2–14.5)
WBC: 19.4 10*3/uL — AB (ref 3.9–10.3)
lymph#: 1.2 10*3/uL (ref 0.9–3.3)

## 2017-05-09 LAB — COMPREHENSIVE METABOLIC PANEL
ALBUMIN: 3.9 g/dL (ref 3.5–5.0)
ALK PHOS: 124 U/L (ref 40–150)
ALT: 12 U/L (ref 0–55)
ANION GAP: 12 meq/L — AB (ref 3–11)
AST: 14 U/L (ref 5–34)
BUN: 5.8 mg/dL — ABNORMAL LOW (ref 7.0–26.0)
CALCIUM: 9.2 mg/dL (ref 8.4–10.4)
CHLORIDE: 104 meq/L (ref 98–109)
CO2: 22 mEq/L (ref 22–29)
Creatinine: 0.7 mg/dL (ref 0.6–1.1)
EGFR: >60 mL/min/{1.73_m2} (ref >60)
Glucose: 101 mg/dl (ref 70–140)
POTASSIUM: 3.9 meq/L (ref 3.5–5.1)
Sodium: 138 mEq/L (ref 136–145)
Total Bilirubin: 0.23 mg/dL (ref 0.20–1.20)
Total Protein: 7.1 g/dL (ref 6.4–8.3)

## 2017-05-09 MED ORDER — ONDANSETRON HCL 8 MG PO TABS: 8.0000 mg | ORAL_TABLET | Freq: Three times a day (TID) | ORAL | 3 refills | Status: DC | PRN

## 2017-05-09 MED ORDER — SODIUM CHLORIDE 0.9 % IV SOLN
Freq: Once | INTRAVENOUS | Status: AC
  Administered 2017-05-09: 15:00:00 via INTRAVENOUS

## 2017-05-09 MED ORDER — PALONOSETRON HCL INJECTION 0.25 MG/5ML
INTRAVENOUS | Status: AC
  Filled 2017-05-09: qty 5

## 2017-05-09 MED ORDER — HEPARIN SOD (PORK) LOCK FLUSH 100 UNIT/ML IV SOLN
500.0000 [IU] | Freq: Once | INTRAVENOUS | Status: AC | PRN
  Administered 2017-05-09: 500 [IU]
  Filled 2017-05-09: qty 5

## 2017-05-09 MED ORDER — SODIUM CHLORIDE 0.9% FLUSH
10.0000 mL | INTRAVENOUS | Status: DC | PRN
  Administered 2017-05-09: 10 mL
  Filled 2017-05-09: qty 10

## 2017-05-09 MED ORDER — SODIUM CHLORIDE 0.9 % IV SOLN
Freq: Once | INTRAVENOUS | Status: AC
  Administered 2017-05-09: 15:00:00 via INTRAVENOUS
  Filled 2017-05-09: qty 5

## 2017-05-09 MED ORDER — PALONOSETRON HCL INJECTION 0.25 MG/5ML
0.2500 mg | Freq: Once | INTRAVENOUS | Status: AC
  Administered 2017-05-09: 0.25 mg via INTRAVENOUS

## 2017-05-09 MED ORDER — SODIUM CHLORIDE 0.9 % IV SOLN
600.0000 mg/m2 | Freq: Once | INTRAVENOUS | Status: AC
  Administered 2017-05-09: 1100 mg via INTRAVENOUS
  Filled 2017-05-09: qty 55

## 2017-05-09 MED ORDER — SODIUM CHLORIDE 0.9% FLUSH
10.0000 mL | Freq: Once | INTRAVENOUS | Status: AC
  Administered 2017-05-09: 10 mL
  Filled 2017-05-09: qty 10

## 2017-05-09 MED ORDER — PROCHLORPERAZINE MALEATE 10 MG PO TABS: 10.0000 mg | ORAL_TABLET | Freq: Four times a day (QID) | ORAL | 1 refills | Status: DC | PRN

## 2017-05-09 MED ORDER — DOXORUBICIN HCL CHEMO IV INJECTION 2 MG/ML
60.0000 mg/m2 | Freq: Once | INTRAVENOUS | Status: AC
  Administered 2017-05-09: 110 mg via INTRAVENOUS
  Filled 2017-05-09: qty 55

## 2017-05-09 NOTE — Patient Instructions (Signed)
[facility] Discharge Instructions for Patients Receiving Chemotherapy  Today you received the following chemotherapy agents Adriamycin, Cytoxan.  To help prevent nausea and vomiting after your treatment, we encourage you to take your nausea medication as prescribed.   If you develop nausea and vomiting that is not controlled by your nausea medication, call the clinic.   BELOW ARE SYMPTOMS THAT SHOULD BE REPORTED IMMEDIATELY:  *FEVER GREATER THAN 100.5 F  *CHILLS WITH OR WITHOUT FEVER  NAUSEA AND VOMITING THAT IS NOT CONTROLLED WITH YOUR NAUSEA MEDICATION  *UNUSUAL SHORTNESS OF BREATH  *UNUSUAL BRUISING OR BLEEDING  TENDERNESS IN MOUTH AND THROAT WITH OR WITHOUT PRESENCE OF ULCERS  *URINARY PROBLEMS  *BOWEL PROBLEMS  UNUSUAL RASH Items with * indicate a potential emergency and should be followed up as soon as possible.  Feel free to call the clinic should you have any questions or concerns. The clinic phone number is [phone].  Please show the CHEMO ALERT CARD at check-in to the Emergency Department and triage nurse.   

## 2017-05-11 ENCOUNTER — Ambulatory Visit: Payer: 59

## 2017-05-11 ENCOUNTER — Ambulatory Visit (HOSPITAL_BASED_OUTPATIENT_CLINIC_OR_DEPARTMENT_OTHER): Payer: 59

## 2017-05-11 VITALS — BP 133/69 | HR 84 | Temp 98.1°F | Resp 18

## 2017-05-11 DIAGNOSIS — Z5189 Encounter for other specified aftercare: Secondary | ICD-10-CM | POA: Diagnosis not present

## 2017-05-11 DIAGNOSIS — C50411 Malignant neoplasm of upper-outer quadrant of right female breast: Secondary | ICD-10-CM | POA: Diagnosis not present

## 2017-05-11 DIAGNOSIS — Z171 Estrogen receptor negative status [ER-]: Principal | ICD-10-CM

## 2017-05-11 MED ORDER — PEGFILGRASTIM INJECTION 6 MG/0.6ML ~~LOC~~
6.0000 mg | PREFILLED_SYRINGE | Freq: Once | SUBCUTANEOUS | Status: AC
  Administered 2017-05-11: 6 mg via SUBCUTANEOUS

## 2017-05-11 NOTE — Patient Instructions (Signed)
Pegfilgrastim injection What is this medicine? PEGFILGRASTIM (PEG fil gra stim) is a long-acting granulocyte colony-stimulating factor that stimulates the growth of neutrophils, a type of white blood cell important in the body's fight against infection. It is used to reduce the incidence of fever and infection in patients with certain types of cancer who are receiving chemotherapy that affects the bone marrow, and to increase survival after being exposed to high doses of radiation. This medicine may be used for other purposes; ask your health care provider or pharmacist if you have questions. COMMON BRAND NAME(S): Neulasta What should I tell my health care provider before I take this medicine? They need to know if you have any of these conditions: -kidney disease -latex allergy -ongoing radiation therapy -sickle cell disease -skin reactions to acrylic adhesives (On-Body Injector only) -an unusual or allergic reaction to pegfilgrastim, filgrastim, other medicines, foods, dyes, or preservatives -pregnant or trying to get pregnant -breast-feeding How should I use this medicine? This medicine is for injection under the skin. If you get this medicine at home, you will be taught how to prepare and give the pre-filled syringe or how to use the On-body Injector. Refer to the patient Instructions for Use for detailed instructions. Use exactly as directed. Tell your healthcare provider immediately if you suspect that the On-body Injector may not have performed as intended or if you suspect the use of the On-body Injector resulted in a missed or partial dose. It is important that you put your used needles and syringes in a special sharps container. Do not put them in a trash can. If you do not have a sharps container, call your pharmacist or healthcare provider to get one. Talk to your pediatrician regarding the use of this medicine in children. While this drug may be prescribed for selected conditions,  precautions do apply. Overdosage: If you think you have taken too much of this medicine contact a poison control center or emergency room at once. NOTE: This medicine is only for you. Do not share this medicine with others. What if I miss a dose? It is important not to miss your dose. Call your doctor or health care professional if you miss your dose. If you miss a dose due to an On-body Injector failure or leakage, a new dose should be administered as soon as possible using a single prefilled syringe for manual use. What may interact with this medicine? Interactions have not been studied. Give your health care provider a list of all the medicines, herbs, non-prescription drugs, or dietary supplements you use. Also tell them if you smoke, drink alcohol, or use illegal drugs. Some items may interact with your medicine. This list may not describe all possible interactions. Give your health care provider a list of all the medicines, herbs, non-prescription drugs, or dietary supplements you use. Also tell them if you smoke, drink alcohol, or use illegal drugs. Some items may interact with your medicine. What should I watch for while using this medicine? You may need blood work done while you are taking this medicine. If you are going to need a MRI, CT scan, or other procedure, tell your doctor that you are using this medicine (On-Body Injector only). What side effects may I notice from receiving this medicine? Side effects that you should report to your doctor or health care professional as soon as possible: -allergic reactions like skin rash, itching or hives, swelling of the face, lips, or tongue -dizziness -fever -pain, redness, or irritation at site   where injected -pinpoint red spots on the skin -red or dark-brown urine -shortness of breath or breathing problems -stomach or side pain, or pain at the shoulder -swelling -tiredness -trouble passing urine or change in the amount of urine Side  effects that usually do not require medical attention (report to your doctor or health care professional if they continue or are bothersome): -bone pain -muscle pain This list may not describe all possible side effects. Call your doctor for medical advice about side effects. You may report side effects to FDA at 1-800-FDA-1088. Where should I keep my medicine? Keep out of the reach of children. Store pre-filled syringes in a refrigerator between 2 and 8 degrees C (36 and 46 degrees F). Do not freeze. Keep in carton to protect from light. Throw away this medicine if it is left out of the refrigerator for more than 48 hours. Throw away any unused medicine after the expiration date. NOTE: This sheet is a summary. It may not cover all possible information. If you have questions about this medicine, talk to your doctor, pharmacist, or health care provider.  2018 Elsevier/Gold Standard (2016-05-10 12:58:03)  

## 2017-05-14 ENCOUNTER — Telehealth: Payer: Self-pay

## 2017-05-14 NOTE — Telephone Encounter (Signed)
Pt called reporting coughing up yellow green mucous during the night last night.  No fevers or congestion.  No other symptoms reported.  Pt advised to continue guaifenesin and claritin and, to call back if symptoms worsen.  Pt is scheduled to see [place] and have blood work on 12/20.

## 2017-05-16 ENCOUNTER — Other Ambulatory Visit (HOSPITAL_BASED_OUTPATIENT_CLINIC_OR_DEPARTMENT_OTHER): Payer: 59

## 2017-05-16 ENCOUNTER — Ambulatory Visit (HOSPITAL_BASED_OUTPATIENT_CLINIC_OR_DEPARTMENT_OTHER): Payer: 59

## 2017-05-16 ENCOUNTER — Ambulatory Visit (HOSPITAL_BASED_OUTPATIENT_CLINIC_OR_DEPARTMENT_OTHER): Payer: 59 | Admitting: Adult Health

## 2017-05-16 ENCOUNTER — Other Ambulatory Visit: Payer: 59

## 2017-05-16 ENCOUNTER — Encounter: Payer: Self-pay | Admitting: Adult Health

## 2017-05-16 VITALS — BP 132/67 | HR 64 | Temp 98.1°F | Resp 18 | Ht 65.0 in | Wt 161.6 lb

## 2017-05-16 DIAGNOSIS — R059 Cough, unspecified: Secondary | ICD-10-CM

## 2017-05-16 DIAGNOSIS — R05 Cough: Secondary | ICD-10-CM | POA: Diagnosis not present

## 2017-05-16 DIAGNOSIS — C50411 Malignant neoplasm of upper-outer quadrant of right female breast: Secondary | ICD-10-CM

## 2017-05-16 DIAGNOSIS — Z171 Estrogen receptor negative status [ER-]: Secondary | ICD-10-CM

## 2017-05-16 LAB — COMPREHENSIVE METABOLIC PANEL
ALBUMIN: 3.6 g/dL (ref 3.5–5.0)
ALT: 12 U/L (ref 0–55)
AST: 11 U/L (ref 5–34)
Alkaline Phosphatase: 129 U/L (ref 40–150)
Anion Gap: 8 mEq/L (ref 3–11)
BUN: 7.3 mg/dL (ref 7.0–26.0)
CALCIUM: 8.8 mg/dL (ref 8.4–10.4)
CHLORIDE: 105 meq/L (ref 98–109)
CO2: 24 mEq/L (ref 22–29)
CREATININE: 0.6 mg/dL (ref 0.6–1.1)
EGFR: >60 mL/min/{1.73_m2} (ref >60)
Glucose: 107 mg/dl (ref 70–140)
Potassium: 3.9 mEq/L (ref 3.5–5.1)
Sodium: 138 mEq/L (ref 136–145)
Total Bilirubin: 0.26 mg/dL (ref 0.20–1.20)
Total Protein: 6.5 g/dL (ref 6.4–8.3)

## 2017-05-16 LAB — CBC WITH DIFFERENTIAL/PLATELET
BASO%: 0.6 % (ref 0.0–2.0)
Basophils Absolute: 0 10*3/uL (ref 0.0–0.1)
EOS ABS: 0.1 10*3/uL (ref 0.0–0.5)
EOS%: 2.3 % (ref 0.0–7.0)
HCT: 31.1 % — ABNORMAL LOW (ref 34.8–46.6)
HGB: 10.9 g/dL — ABNORMAL LOW (ref 11.6–15.9)
LYMPH%: 13 % — AB (ref 14.0–49.7)
MCH: 33.1 pg (ref 25.1–34.0)
MCHC: 35.1 g/dL (ref 31.5–36.0)
MCV: 94.3 fL (ref 79.5–101.0)
MONO#: 0.4 10*3/uL (ref 0.1–0.9)
MONO%: 10.6 % (ref 0.0–14.0)
NEUT%: 73.5 % (ref 38.4–76.8)
NEUTROS ABS: 2.7 10*3/uL (ref 1.5–6.5)
Platelets: 269 10*3/uL (ref 145–400)
RBC: 3.3 10*6/uL — AB (ref 3.70–5.45)
RDW: 12.5 % (ref 11.2–14.5)
WBC: 3.7 10*3/uL — AB (ref 3.9–10.3)
lymph#: 0.5 10*3/uL — ABNORMAL LOW (ref 0.9–3.3)

## 2017-05-16 MED ORDER — HYDROCOD POLST-CPM POLST ER 10-8 MG/5ML PO SUER: 5.0000 mL | Freq: Every evening | ORAL | 0 refills | Status: DC | PRN

## 2017-05-16 MED ORDER — HEPARIN SOD (PORK) LOCK FLUSH 100 UNIT/ML IV SOLN
250.0000 [IU] | Freq: Once | INTRAVENOUS | Status: AC
  Administered 2017-05-16: 250 [IU]
  Filled 2017-05-16: qty 5

## 2017-05-16 MED ORDER — SODIUM CHLORIDE 0.9% FLUSH
10.0000 mL | Freq: Once | INTRAVENOUS | Status: AC
  Administered 2017-05-16: 10 mL
  Filled 2017-05-16: qty 10

## 2017-05-16 NOTE — Progress Notes (Signed)
[place]  [phone] [phone]     ID: Jennifer Beck DOB: 1966/05/22  MR#: [id number]  VVO#:[id number]  Patient Care Team: Jennifer Shirts, MD as PCP - General (Internal Medicine) Jennifer Sine, MD as Consulting Physician (Dermatology) Jennifer Beck, DC as Referring Physician (Chiropractic Medicine) Jennifer Overall, MD as Consulting Physician (General Surgery) Jennifer Gibson, MD as Attending Physician (Radiation Oncology) Jennifer Fus, MD as Consulting Physician (Obstetrics and Gynecology) OTHER MD:  CHIEF COMPLAINT: Triple negative breast cancer  CURRENT TREATMENT: Neoadjuvant chemotherapy   HISTORY OF CURRENT ILLNESS:  From the original intake note:  Jennifer Beck palpated a mass in her right breast sometime in September 2018.[name] She brought this to medical attention and her PCP, Dr. Bing Beck, set her up on 03/18/2017 for a unilateral right diagnostic mammography with ultrasonography, showing: Breast density D. The palpable right breast mass at 12:30 was indeterminate, and biopsy was warranted at that time. No evidence of right axillary lymphadenopathy was noted. Biopsy of the lesion in question on 03/21/2017 at the right breast 12:30 position showed ([id number])  invasive ductal carcinoma with lymphovascular invasion.  The tumor was grade 3, triple negative, with an MIB-1 of 80%.  She is accompanied by her husband, Jennifer Beck to the office today. She reports that she is doing well Beck. She initially felt a lump in September in the shower and notes that she doesn't complete monthly self breast exams. She had a routine mammogram in March 2018 at [facility] and she is followed by Dr. Evette Beck.  As far as surgeries, she has had two laparoscopic procedures for endometriosis as well as a bilateral tubal ligation. She still has occasional cramping, painful ovulation, and vaginal spotting that comes and goes. She has had cyst  to her breast before that have been evaluated and ruled as negative. She notes that she still has her tonsils, gallbladder, and appendix. She has a prior hx of pericarditis in 2003 that she notes was brought onby a virus. She has since been cleared by her prior Cardiologist.   She has a Restaurant manager, fast food, Jennifer Beck that she sees every 5 weeks for maintenance of her neck and back issues. She has a dermatologist, Jennifer Beck at [facility] and she has had an biopsy of an area from her left chest that resulted as basal cell carcinoma in 2015. She denies having a GI specialist, Cardiologist, or Orthopedist at this time. She denies prior history of seizures, migraines, acid reflux, asthma, emphysema, palpitations or GI issues.    [name] is as detailed below.  INTERVAL HISTORY: Jennifer Beck returns today for follow-up and treatment of her triple negative breast cancer accompanied by her husband. Today is day 8 cycle 3 of 4 planned cycles of cyclophosphamide and doxorubicin given every 14 days. She tolerated treatment relatively well.     REVIEW OF SYSTEMS:  Jennifer Beck is doing better than she did after her last treatment.  She is experiencing a cough and post nasal drip x 1 week (clear drainage).  She is taking Robitussin at night and it isn't helping.  She has not ran a fever.  She denies pleuritic chest pain, shortness of breath.   She started on Omeprazole and cut down the dexamethasone and her reflux is resolved.  She again had aching in her shoulders on Monday and Tuesday following the Neulasta. Otherwise a detailed ros is non contributory.    PAST MEDICAL HISTORY: Past Medical History:  Diagnosis  Date  . Chest pain   . Complication of anesthesia   . Endometriosis   . Family history of breast cancer 04/22/2017  . Family history of colon cancer 04/22/2017  . Family history of kidney cancer 04/22/2017  . Fatigue   . GERD (gastroesophageal reflux disease)     . GI bleed    caused by ischemic colitis following an episode of hypertension  . Heart palpitations   . Hx of echocardiogram    a. echo 9/13:  EF 55-60%, Gr 2 diast dysfn  . Hypercholesterolemia   . Ischemic colitis ([place])   . 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 Myocarditis ([place])   . Pericarditis   . PONV (postoperative nausea and vomiting)   . SOB (shortness of breath)     PAST SURGICAL HISTORY: Past Surgical History:  Procedure Laterality Date  . CARDIAC CATHETERIZATION  08/26/2001   EF of 60% --- smooth & normal coronary arteries -- there is a Pirozzi motion defect consistent with posterolateral MI -- she quite possibly had a coronary spasm -- she may have some pericarditis secondary to her MI   . DILATION AND CURETTAGE OF UTERUS  10/15/2003   Uterine enlargement, menorrhagia  . DILATION AND CURETTAGE OF UTERUS  05/27/2002   Dysfunctional uterine bleeding  . LAPAROSCOPY    . NOVASURE ABLATION  10/15/2003   for management of her extreme menorrhagi  . PORTACATH PLACEMENT Right 04/02/2017   Procedure: INSERTION PORT-A-CATH;  Surgeon: Jennifer Overall, MD;  Location: [facility];  Service: General;  Laterality: Right;  . TUBAL LIGATION  09/2000   bilateral    FAMILY HISTORY Family History  Problem Relation Age of Onset  . Hypertension Father   . Diabetes Father   . Other Father        stomach mass suspicious for ca, never bx  . Coronary artery disease Mother   . Kidney cancer Mother 61  . Coronary artery disease Maternal Grandfather   . Colon cancer Maternal Grandfather 61       recurrence  . Coronary artery disease Maternal Uncle   . Coronary artery disease Maternal Uncle   . Diabetes Maternal Uncle   . Colon cancer Maternal Grandmother 60  . Breast cancer Cousin 29       had GT- CHEK2+  . Cervical cancer Maternal Aunt 35  . Diabetes Maternal Aunt   . Alzheimer's disease Paternal Grandmother   . Colon cancer  Paternal Grandfather 2  . Breast cancer Paternal Aunt   . Breast cancer Paternal Aunt   . Breast cancer Other 61   Her father died last Mar 01, 2016 at age 72 just 69 week shy of his 83rd birthday. Her mother is 46 years old as of October 2018. Patient has one brother and no sisters. Her mother was diagnosed with kidney cancer at age 44 with a nephrectomy following. Her maternal grandmother was diagnosed with colon cancer at age 6.  The patient paternal grandfather was diagnosed with colon cancer in his 31's. She has paternal cousins through her fathers half-sisters that have been diagnosed with breast cancer, she is unsure of the number of breast cancer occurrences due to the distance . A maternal cousin was diagnosed with breast cancer at age 65 and she had genetic testing. The patient does have a copy of this testing as well as her PCP.  This was not available for review on the nasal  GYNECOLOGIC  HISTORY:  No LMP recorded. Patient has had an ablation.   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 Her husband Jennifer Beck is a Airline pilot. Her daughter Jennifer Beck is 44 and currently a sophomore at [employer]. 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  The patient attends [employer]. She lives in [place].      ADVANCED DIRECTIVES:    HEALTH MAINTENANCE: Social History   Tobacco Use  . Smoking status: Never Smoker  . Smokeless tobacco: Never Used  Substance Use Topics  . Alcohol use: Yes    Comment: Occasionally drinks wine  . Drug use: No     Colonoscopy: Yes, 2003  PAP: March 2018   Bone density: N/A   Allergies  Allergen Reactions  . Codeine Itching  . Biaxin [Clarithromycin]     Heart  Burn    Current Outpatient Medications  Medication Sig Dispense Refill  . acetaminophen (TYLENOL) 325 MG tablet Take 650 mg by mouth every 6 (six) hours as needed.    . ALPRAZolam (XANAX) 0.25 MG tablet Take 0.25 mg by mouth daily as needed for anxiety.    . Cholecalciferol (VITAMIN D) 2000 units CAPS Take 2,000 Units by mouth daily.    [name] dexamethasone (DECADRON) 4 MG tablet Take 2 tablets by mouth once a day on the day after chemotherapy and then take 2 tablets two times a day for 2 days. Take with food. 30 tablet 1  . Diphenhyd-Hydrocort-Nystatin (FIRST-DUKES MOUTHWASH) SUSP 5-10 ml qid prn swish swallow or spit 240 mL 3  . Evening Primrose Oil 1000 MG CAPS Take 1 capsule by mouth every evening.     . famotidine (PEPCID) 10 MG tablet Take 10 mg by mouth 2 (two) times daily.    . fluocinonide cream (LIDEX) 1.61 % Apply 1 application topically 2 (two) times daily as needed.     [name] glucosamine-chondroitin 500-400 MG tablet Take 1 tablet by mouth daily.     [name] ibuprofen (ADVIL,MOTRIN) 200 MG tablet Take 400 mg by mouth every 8 (eight) hours as needed for headache or mild pain.     [name] lidocaine-prilocaine (EMLA) cream Apply to affected area once 30 g 3  . LORazepam (ATIVAN) 0.5 MG tablet Take 1 tablet (0.5 mg total) by mouth at bedtime as needed (Nausea or vomiting). 30 tablet 0  . Melatonin 5 MG TABS Take 5 mg by mouth at bedtime as needed (sleep).     . Multiple Minerals-Vitamins (CALCIUM-MAGNESIUM-ZINC) TABS Take 1 tablet by mouth daily.     . Multiple Vitamins-Minerals (MULTIVITAMINS THER. W/MINERALS) TABS Take 1 tablet by mouth daily.     . Omega-3 Fatty Acids (FISH OIL) 1000 MG CAPS Take 1,000 mg by mouth 2 (two) times a week.     [name] omeprazole (PRILOSEC) 40 MG capsule Take 1 capsule (40 mg total) by mouth 2 (two) times daily. 60 capsule 0  . ondansetron (ZOFRAN) 8 MG tablet Take 1 tablet (8 mg total) by mouth every 8 (eight) hours as needed for nausea or vomiting. 20 tablet 3  . Polyethyl  Glycol-Propyl Glycol (SYSTANE OP) Apply 1 drop to eye 2 (two) times daily.    . prochlorperazine (COMPAZINE)  10 MG tablet Take 1 tablet (10 mg total) by mouth every 6 (six) hours as needed (Nausea or vomiting). 30 tablet 1  . VITAMIN E PO Take 1 tablet by mouth daily.      No current facility-administered medications for this visit.     OBJECTIVE:   Vitals:   05/16/17 0824  BP: 132/67  Pulse: 64  Resp: 18  Temp: 98.1 F (36.7 C)  SpO2: 100%     Body mass index is 26.89 kg/m.   Wt Readings from Last 3 Encounters:  05/16/17 161 lb 9.6 oz (73.3 kg)  05/09/17 163 lb 4.8 oz (74.1 kg)  05/01/17 161 lb 11.2 oz (73.3 kg)      ECOG FS:1 GENERAL: Patient is a well appearing female in no acute distress HEENT:  Sclerae anicteric.  Oropharynx clear and moist. No ulcerations or evidence of oropharyngeal candidiasis. Neck is supple. No sinus tenderness to palpation NODES:  No cervical, supraclavicular, or axillary lymphadenopathy palpated.  BREAST EXAM:  Deferred. LUNGS:  Clear to auscultation bilaterally.  No wheezes or rhonchi. HEART:  Regular rate and rhythm. No murmur appreciated. ABDOMEN:  Soft, nontender.  Positive, normoactive bowel sounds. No organomegaly palpated. MSK:  No focal spinal tenderness to palpation. Full range of motion bilaterally in the upper extremities. EXTREMITIES:  No peripheral edema.   SKIN:  Clear with no obvious rashes or skin changes. No nail dyscrasia. NEURO:  Nonfocal. Well oriented.  Appropriate affect.       LAB RESULTS:  CMP     Component Value Date/Time   NA 138 05/09/2017 1212   K 3.9 05/09/2017 1212   CL 106 02/05/2012 1742   CO2 22 05/09/2017 1212   GLUCOSE 101 05/09/2017 1212   BUN 5.8 (L) 05/09/2017 1212   CREATININE 0.7 05/09/2017 1212   CALCIUM 9.2 05/09/2017 1212   PROT 7.1 05/09/2017 1212   ALBUMIN 3.9 05/09/2017 1212   AST 14 05/09/2017 1212   ALT 12 05/09/2017 1212   ALKPHOS 124 05/09/2017 1212   BILITOT 0.23 05/09/2017  1212    No results found for: TOTALPROTELP, ALBUMINELP, A1GS, A2GS, BETS, BETA2SER, GAMS, MSPIKE, SPEI  No results found for: KPAFRELGTCHN, LAMBDASER, KAPLAMBRATIO  Lab Results  Component Value Date   WBC 19.4 (H) 05/09/2017   NEUTROABS 17.0 (H) 05/09/2017   HGB 12.0 05/09/2017   HCT 34.9 05/09/2017   MCV 95.6 05/09/2017   PLT 222 05/09/2017    _0 @  No results found for: LABCA2  No components found for: UGQBVQ945  No results for input(s): INR in the last 168 hours.  No results found for: LABCA2  No results found for: WTU882  No results found for: CMK349  No results found for: ZPH150  No results found for: CA2729  No components found for: HGQUANT  No results found for: CEA1 / No results found for: CEA1   No results found for: AFPTUMOR  No results found for: CHROMOGRNA  No results found for: PSA1  No visits with results within 3 Day(s) from this visit.  Latest known visit with results is:  Appointment on 05/09/2017  Component Date Value Ref Range Status  . Sodium 05/09/2017 138  136 - 145 mEq/L Final  . Potassium 05/09/2017 3.9  3.5 - 5.1 mEq/L Final  . Chloride 05/09/2017 104  98 - 109 mEq/L Final  . CO2 05/09/2017 22  22 - 29 mEq/L Final  . Glucose 05/09/2017 101  70 - 140 mg/dl Final   Glucose reference range  is for nonfasting patients. Fasting glucose reference range is 70- 100.  [name] BUN 05/09/2017 5.8* 7.0 - 26.0 mg/dL Final  . Creatinine 05/09/2017 0.7  0.6 - 1.1 mg/dL Final  . Total Bilirubin 05/09/2017 0.23  0.20 - 1.20 mg/dL Final  . Alkaline Phosphatase 05/09/2017 124  40 - 150 U/L Final  . AST 05/09/2017 14  5 - 34 U/L Final  . ALT 05/09/2017 12  0 - 55 U/L Final  . Total Protein 05/09/2017 7.1  6.4 - 8.3 g/dL Final  . Albumin 05/09/2017 3.9  3.5 - 5.0 g/dL Final  . Calcium 05/09/2017 9.2  8.4 - 10.4 mg/dL Final  . Anion Gap 05/09/2017 12* 3 - 11 mEq/L Final  . EGFR 05/09/2017 >60  >60 ml/min/1.73 m2 Final   eGFR is calculated using  the CKD-EPI Creatinine Equation (2009)  . WBC 05/09/2017 19.4* 3.9 - 10.3 10e3/uL Final  . NEUT# 05/09/2017 17.0* 1.5 - 6.5 10e3/uL Final  . HGB 05/09/2017 12.0  11.6 - 15.9 g/dL Final  . HCT 05/09/2017 34.9  34.8 - 46.6 % Final  . Platelets 05/09/2017 222  145 - 400 10e3/uL Final  . MCV 05/09/2017 95.6  79.5 - 101.0 fL Final  . MCH 05/09/2017 32.9  25.1 - 34.0 pg Final  . MCHC 05/09/2017 34.4  31.5 - 36.0 g/dL Final  . RBC 05/09/2017 3.65* 3.70 - 5.45 10e6/uL Final  . RDW 05/09/2017 12.6  11.2 - 14.5 % Final  . lymph# 05/09/2017 1.2  0.9 - 3.3 10e3/uL Final  . MONO# 05/09/2017 1.0* 0.1 - 0.9 10e3/uL Final  . Eosinophils Absolute 05/09/2017 0.0  0.0 - 0.5 10e3/uL Final  . Basophils Absolute 05/09/2017 0.1  0.0 - 0.1 10e3/uL Final  . NEUT% 05/09/2017 87.8* 38.4 - 76.8 % Final  . LYMPH% 05/09/2017 6.4* 14.0 - 49.7 % Final  . MONO% 05/09/2017 5.1  0.0 - 14.0 % Final  . EOS% 05/09/2017 0.2  0.0 - 7.0 % Final  . BASO% 05/09/2017 0.5  0.0 - 2.0 % Final    (this displays the last labs from the last 3 days)  No results found for: TOTALPROTELP, ALBUMINELP, A1GS, A2GS, BETS, BETA2SER, GAMS, MSPIKE, SPEI (this displays SPEP labs)  No results found for: KPAFRELGTCHN, LAMBDASER, KAPLAMBRATIO (kappa/lambda light chains)  No results found for: HGBA, HGBA2QUANT, HGBFQUANT, HGBSQUAN (Hemoglobinopathy evaluation)   No results found for: LDH  No results found for: IRON, TIBC, IRONPCTSAT (Iron and TIBC)  No results found for: FERRITIN  Urinalysis No results found for: COLORURINE, APPEARANCEUR, LABSPEC, PHURINE, GLUCOSEU, HGBUR, BILIRUBINUR, KETONESUR, PROTEINUR, UROBILINOGEN, NITRITE, LEUKOCYTESUR   STUDIES: No results found.  ELIGIBLE FOR AVAILABLE RESEARCH PROTOCOL:UPBEAT trial --patient refused  ASSESSMENT: 51 y.o. [place] woman status post right breast upper inner quadrant biopsy March 21, 2017 for a clinical T2 N0  Stage IIB invasive ductal carcinoma, grade 3, triple  negative, with an MIB-1 of 80%.  (a) biopsy of 2 additional suspicious areas in the right breast and one in the left breast scheduled for April 11, 2017  (1) neoadjuvant chemotherapy will consist of doxorubicin and cyclophosphamide in dose dense fashion x4, followed by paclitaxel/carboplatin weekly x12  (2) definitive surgery to follow  (3) adjuvant radiation as appropriate  (4) genetics testing 04/22/2017 through the common hereditary cancer panel plus renal/urinary tract cancel panel showed no deleterious mutations in APC, ATM, AXIN2, BAP1, BARD1, BMPR1A, BRCA1, BRCA2, BRIP1, BUB1B, CDC73, CDH1, CDK4, CDKN1C, CDKN2A (p14ARF), CDKN2A (p16INK4a), CEP57, CHEK2, CTNNA1, DICER1, DIS3L2, EPCAM*, FH,  FLCN, GPC3, GREM1*, KIT, MEN1, MET, MLH1, MSH2, MSH3, MSH6, MUTYH, NBN, NF1, PALB2, PDGFRA, PMS2, POLD1, POLE, PTEN, RAD50, RAD51C, RAD51D, SDHB, SDHC, SDHD, SMAD4, SMARCA4, SMARCB1, STK11, TP53, TSC1, TSC2, VHL, WT1 The following genes were evaluated for sequence changes only: HOXB13*, MITF*, NTHL1*, SDHA  (a) there was a Variant of Uncertain Significance identified in POLD1, namely c.2953C>T (p.Arg985Trp)  PLAN: Areya is doing well today.  I reviewed her CBC with her in detail.  She is not neutropenic. She is slightly anemic.  I think her cough is likely viral related.  I wrote her a prescription for Tussionex to take at night for her cough.  She has a codeine allergy, but tells me that she has tolerated Vicodin in the past without difficulty.  She will return in one week for labs, follow up, and cycle 4 of Doxorubicin and cyclophosphamide.    Jenai knows to call for any questions or concerns prior to her next appointment with [place].    A total of (30) minutes of face-to-face time was spent with this patient with greater than 50% of that time in counseling and care-coordination.   Wilber Bihari, NP  05/16/17 8:26 AM Medical Oncology and Hematology [facility] [place], [place] [address] Tel. [phone]    Fax. [phone]

## 2017-05-17 ENCOUNTER — Telehealth: Payer: Self-pay | Admitting: Adult Health

## 2017-05-17 LAB — FOLLICLE STIMULATING HORMONE: FSH: 53.4 m[IU]/mL

## 2017-05-17 NOTE — Telephone Encounter (Signed)
No 12/20 los °

## 2017-05-19 LAB — ESTRADIOL, ULTRA SENS: Estradiol, Sensitive: 8.3 pg/mL

## 2017-05-20 ENCOUNTER — Telehealth: Payer: Self-pay | Admitting: *Deleted

## 2017-05-20 MED ORDER — AZITHROMYCIN 250 MG PO TABS: ORAL_TABLET | ORAL | 0 refills | Status: DC

## 2017-05-20 NOTE — Telephone Encounter (Signed)
This RN spoke with pt per her call stating she is continuing with congestion ( discussed at visit last week )- " which I think is more sinus congestion then just in my chest "  Jennifer Beck states she is coughing green phlegm as well as nasal mucous. She has headaches " and even my teeth are sore "  Per above - Z Pak prescription obtained ( pt states she has been on before with good tolerance ) and recommended over the counter sudafed ( 12 Hour ) as well as Muccinex DM for chest congestion.  Pharmacy verified and prescription sent.  Pt understands to call if symptoms worsen or she develops a fever.

## 2017-05-23 ENCOUNTER — Ambulatory Visit (HOSPITAL_BASED_OUTPATIENT_CLINIC_OR_DEPARTMENT_OTHER): Payer: 59

## 2017-05-23 ENCOUNTER — Other Ambulatory Visit: Payer: 59

## 2017-05-23 ENCOUNTER — Ambulatory Visit: Payer: 59

## 2017-05-23 ENCOUNTER — Ambulatory Visit: Payer: 59 | Admitting: Adult Health

## 2017-05-23 ENCOUNTER — Ambulatory Visit (HOSPITAL_BASED_OUTPATIENT_CLINIC_OR_DEPARTMENT_OTHER): Payer: 59 | Admitting: Adult Health

## 2017-05-23 ENCOUNTER — Other Ambulatory Visit (HOSPITAL_BASED_OUTPATIENT_CLINIC_OR_DEPARTMENT_OTHER): Payer: 59

## 2017-05-23 ENCOUNTER — Telehealth: Payer: Self-pay | Admitting: Adult Health

## 2017-05-23 VITALS — BP 112/72 | HR 89 | Temp 98.5°F | Resp 18 | Wt 167.5 lb

## 2017-05-23 DIAGNOSIS — C50411 Malignant neoplasm of upper-outer quadrant of right female breast: Secondary | ICD-10-CM

## 2017-05-23 DIAGNOSIS — Z171 Estrogen receptor negative status [ER-]: Principal | ICD-10-CM

## 2017-05-23 DIAGNOSIS — Z5111 Encounter for antineoplastic chemotherapy: Secondary | ICD-10-CM | POA: Diagnosis not present

## 2017-05-23 LAB — COMPREHENSIVE METABOLIC PANEL
ALT: 15 U/L (ref 0–55)
ANION GAP: 8 meq/L (ref 3–11)
AST: 16 U/L (ref 5–34)
Albumin: 3.5 g/dL (ref 3.5–5.0)
Alkaline Phosphatase: 102 U/L (ref 40–150)
BUN: 9 mg/dL (ref 7.0–26.0)
CHLORIDE: 105 meq/L (ref 98–109)
CO2: 24 meq/L (ref 22–29)
CREATININE: 0.7 mg/dL (ref 0.6–1.1)
Calcium: 8.8 mg/dL (ref 8.4–10.4)
EGFR: >60 mL/min/{1.73_m2} (ref >60)
GLUCOSE: 105 mg/dL (ref 70–140)
Potassium: 4 mEq/L (ref 3.5–5.1)
SODIUM: 138 meq/L (ref 136–145)
Total Bilirubin: <0.22 mg/dL (ref 0.20–1.20)
Total Protein: 6.4 g/dL (ref 6.4–8.3)

## 2017-05-23 LAB — CBC WITH DIFFERENTIAL/PLATELET
BASO%: 0.4 % (ref 0.0–2.0)
BASOS ABS: 0.1 10*3/uL (ref 0.0–0.1)
EOS ABS: 0.1 10*3/uL (ref 0.0–0.5)
EOS%: 0.8 % (ref 0.0–7.0)
HCT: 31.5 % — ABNORMAL LOW (ref 34.8–46.6)
HEMOGLOBIN: 10.8 g/dL — AB (ref 11.6–15.9)
LYMPH%: 10 % — ABNORMAL LOW (ref 14.0–49.7)
MCH: 32.8 pg (ref 25.1–34.0)
MCHC: 34.3 g/dL (ref 31.5–36.0)
MCV: 95.5 fL (ref 79.5–101.0)
MONO#: 1.1 10*3/uL — ABNORMAL HIGH (ref 0.1–0.9)
MONO%: 9.1 % (ref 0.0–14.0)
NEUT#: 9.4 10*3/uL — ABNORMAL HIGH (ref 1.5–6.5)
NEUT%: 79.7 % — ABNORMAL HIGH (ref 38.4–76.8)
NRBC: 1 % — AB (ref 0–0)
Platelets: 225 10*3/uL (ref 145–400)
RBC: 3.3 10*6/uL — AB (ref 3.70–5.45)
RDW: 13.6 % (ref 11.2–14.5)
WBC: 11.8 10*3/uL — ABNORMAL HIGH (ref 3.9–10.3)
lymph#: 1.2 10*3/uL (ref 0.9–3.3)

## 2017-05-23 MED ORDER — HEPARIN SOD (PORK) LOCK FLUSH 100 UNIT/ML IV SOLN
500.0000 [IU] | Freq: Once | INTRAVENOUS | Status: AC | PRN
  Administered 2017-05-23: 500 [IU]
  Filled 2017-05-23: qty 5

## 2017-05-23 MED ORDER — SODIUM CHLORIDE 0.9% FLUSH
10.0000 mL | INTRAVENOUS | Status: DC | PRN
  Administered 2017-05-23: 10 mL
  Filled 2017-05-23: qty 10

## 2017-05-23 MED ORDER — SODIUM CHLORIDE 0.9 % IV SOLN
600.0000 mg/m2 | Freq: Once | INTRAVENOUS | Status: AC
  Administered 2017-05-23: 1100 mg via INTRAVENOUS
  Filled 2017-05-23: qty 55

## 2017-05-23 MED ORDER — SODIUM CHLORIDE 0.9% FLUSH
10.0000 mL | Freq: Once | INTRAVENOUS | Status: AC
  Administered 2017-05-23: 10 mL
  Filled 2017-05-23: qty 10

## 2017-05-23 MED ORDER — SODIUM CHLORIDE 0.9 % IV SOLN
Freq: Once | INTRAVENOUS | Status: AC
  Administered 2017-05-23: 16:00:00 via INTRAVENOUS
  Filled 2017-05-23: qty 5

## 2017-05-23 MED ORDER — PALONOSETRON HCL INJECTION 0.25 MG/5ML
INTRAVENOUS | Status: AC
Start: 2017-05-23 — End: ?
  Filled 2017-05-23: qty 5

## 2017-05-23 MED ORDER — DOXORUBICIN HCL CHEMO IV INJECTION 2 MG/ML
60.0000 mg/m2 | Freq: Once | INTRAVENOUS | Status: AC
  Administered 2017-05-23: 110 mg via INTRAVENOUS
  Filled 2017-05-23: qty 55

## 2017-05-23 MED ORDER — SODIUM CHLORIDE 0.9 % IV SOLN
Freq: Once | INTRAVENOUS | Status: AC
  Administered 2017-05-23: 15:00:00 via INTRAVENOUS

## 2017-05-23 MED ORDER — PALONOSETRON HCL INJECTION 0.25 MG/5ML
0.2500 mg | Freq: Once | INTRAVENOUS | Status: AC
  Administered 2017-05-23: 0.25 mg via INTRAVENOUS

## 2017-05-23 NOTE — Patient Instructions (Signed)
[facility] Discharge Instructions for Patients Receiving Chemotherapy  Today you received the following chemotherapy agents doxorubicin (Adriamycin) and cyclophosphamide (Cytoxan).   To help prevent nausea and vomiting after your treatment, we encourage you to take your nausea medication as prescribed.    If you develop nausea and vomiting that is not controlled by your nausea medication, call the clinic.   BELOW ARE SYMPTOMS THAT SHOULD BE REPORTED IMMEDIATELY:  *FEVER GREATER THAN 100.5 F  *CHILLS WITH OR WITHOUT FEVER  NAUSEA AND VOMITING THAT IS NOT CONTROLLED WITH YOUR NAUSEA MEDICATION  *UNUSUAL SHORTNESS OF BREATH  *UNUSUAL BRUISING OR BLEEDING  TENDERNESS IN MOUTH AND THROAT WITH OR WITHOUT PRESENCE OF ULCERS  *URINARY PROBLEMS  *BOWEL PROBLEMS  UNUSUAL RASH Items with * indicate a potential emergency and should be followed up as soon as possible.  Feel free to call the clinic should you have any questions or concerns. The clinic phone number is [phone].  Please show the CHEMO ALERT CARD at check-in to the Emergency Department and triage nurse. 

## 2017-05-23 NOTE — Telephone Encounter (Signed)
Patient will receive updated schedule in treatment area. Patient scheduled January through March 2019 per 12/27 los.

## 2017-05-23 NOTE — Patient Instructions (Signed)
Implanted Port Home Guide An implanted port is a type of central line that is placed under the skin. Central lines are used to provide IV access when treatment or nutrition needs to be given through a person's veins. Implanted ports are used for long-term IV access. An implanted port may be placed because:  You need IV medicine that would be irritating to the small veins in your hands or arms.  You need long-term IV medicines, such as antibiotics.  You need IV nutrition for a long period.  You need frequent blood draws for lab tests.  You need dialysis.  Implanted ports are usually placed in the chest area, but they can also be placed in the upper arm, the abdomen, or the leg. An implanted port has two main parts:  Reservoir. The reservoir is round and will appear as a small, raised area under your skin. The reservoir is the part where a needle is inserted to give medicines or draw blood.  Catheter. The catheter is a thin, flexible tube that extends from the reservoir. The catheter is placed into a large vein. Medicine that is inserted into the reservoir goes into the catheter and then into the vein.  How will I care for my incision site? Do not get the incision site wet. Bathe or shower as directed by your health care provider. How is my port accessed? Special steps must be taken to access the port:  Before the port is accessed, a numbing cream can be placed on the skin. This helps numb the skin over the port site.  Your health care provider uses a sterile technique to access the port. ? Your health care provider must put on a mask and sterile gloves. ? The skin over your port is cleaned carefully with an antiseptic and allowed to dry. ? The port is gently pinched between sterile gloves, and a needle is inserted into the port.  Only "non-coring" port needles should be used to access the port. Once the port is accessed, a blood return should be checked. This helps ensure that the port  is in the vein and is not clogged.  If your port needs to remain accessed for a constant infusion, a clear (transparent) bandage will be placed over the needle site. The bandage and needle will need to be changed every week, or as directed by your health care provider.  Keep the bandage covering the needle clean and dry. Do not get it wet. Follow your health care provider's instructions on how to take a shower or bath while the port is accessed.  If your port does not need to stay accessed, no bandage is needed over the port.  What is flushing? Flushing helps keep the port from getting clogged. Follow your health care provider's instructions on how and when to flush the port. Ports are usually flushed with saline solution or a medicine called heparin. The need for flushing will depend on how the port is used.  If the port is used for intermittent medicines or blood draws, the port will need to be flushed: ? After medicines have been given. ? After blood has been drawn. ? As part of routine maintenance.  If a constant infusion is running, the port may not need to be flushed.  How long will my port stay implanted? The port can stay in for as long as your health care provider thinks it is needed. When it is time for the port to come out, surgery will be   done to remove it. The procedure is similar to the one performed when the port was put in. When should I seek immediate medical care? When you have an implanted port, you should seek immediate medical care if:  You notice a bad smell coming from the incision site.  You have swelling, redness, or drainage at the incision site.  You have more swelling or pain at the port site or the surrounding area.  You have a fever that is not controlled with medicine.  This information is not intended to replace advice given to you by your health care provider. Make sure you discuss any questions you have with your health care provider. Document  Released: 05/14/2005 Document Revised: 10/20/2015 Document Reviewed: 01/19/2013 Elsevier Interactive Patient Education  2017 Elsevier Inc.  

## 2017-05-23 NOTE — Progress Notes (Signed)
[place]  [phone] [phone]     ID: Jennifer Beck DOB: 07-24-1965  MR#: [id number]  QBH#:[id number]  Patient Care Team: Jennifer Shirts, MD as PCP - General (Internal Medicine) Jennifer Sine, MD as Consulting Physician (Dermatology) Jennifer Beck, DC as Referring Physician (Chiropractic Medicine) Jennifer Overall, MD as Consulting Physician (General Surgery) Jennifer Gibson, MD as Attending Physician (Radiation Oncology) Jennifer Fus, MD as Consulting Physician (Obstetrics and Gynecology) OTHER MD:  CHIEF COMPLAINT: Triple negative breast cancer  CURRENT TREATMENT: Neoadjuvant chemotherapy   HISTORY OF CURRENT ILLNESS:  From the original intake note:  Jennifer Beck palpated a mass in her right breast sometime in September 2018.[name] She brought this to medical attention and her PCP, Dr. Bing Beck, set her up on 03/18/2017 for a unilateral right diagnostic mammography with ultrasonography, showing: Breast density D. The palpable right breast mass at 12:30 was indeterminate, and biopsy was warranted at that time. No evidence of right axillary lymphadenopathy was noted. Biopsy of the lesion in question on 03/21/2017 at the right breast 12:30 position showed ([id number])  invasive ductal carcinoma with lymphovascular invasion.  The tumor was grade 3, triple negative, with an MIB-1 of 80%.  She is accompanied by her husband, Jennifer Beck to the office today. She reports that she is doing well Beck. She initially felt a lump in September in the shower and notes that she doesn't complete monthly self breast exams. She had a routine mammogram in March 2018 at [facility] and she is followed by Jennifer Beck.  As far as surgeries, she has had two laparoscopic procedures for endometriosis as well as a bilateral tubal ligation. She still has occasional cramping, painful ovulation, and vaginal spotting that comes and goes. She has had cyst  to her breast before that have been evaluated and ruled as negative. She notes that she still has her tonsils, gallbladder, and appendix. She has a prior hx of pericarditis in 2003 that she notes was brought onby a virus. She has since been cleared by her prior Cardiologist.   She has a Restaurant manager, fast food, Dr. Milus Beck that she sees every 5 weeks for maintenance of her neck and back issues. She has a dermatologist, Dr. Elvera Beck at [facility] and she has had an biopsy of an area from her left chest that resulted as basal cell carcinoma in 2015. She denies having a GI specialist, Cardiologist, or Orthopedist at this time. She denies prior history of seizures, migraines, acid reflux, asthma, emphysema, palpitations or GI issues.    [name] is as detailed below.  INTERVAL HISTORY: Jennifer Beck returns today for follow-up and treatment of her triple negative breast cancer accompanied by her husband. Today is day 1 cycle 4 of 4 planned cycles of cyclophosphamide and doxorubicin given every 14 days.   REVIEW OF SYSTEMS:  Jennifer Beck is doing well today.  She did have a cough and sinus issues, but those are much better after taking Tussionex and Zpak.  She enjoyed her Christmas.  She denies any fevers, chills, nausea, vomiting, constipation, diarrhea, mucositis, shortness of breath or any other concerns.  A detailed ROS is non contributory.    PAST MEDICAL HISTORY: Past Medical History:  Diagnosis Date  . Chest pain   . Complication of anesthesia   . Endometriosis   . Family history of breast cancer 04/22/2017  . Family history of colon cancer 04/22/2017  . Family history of kidney cancer 04/22/2017  . Fatigue   .  GERD (gastroesophageal reflux disease)   . GI bleed    caused by ischemic colitis following an episode of hypertension  . Heart palpitations   . Hx of echocardiogram    a. echo 9/13:  EF 55-60%, Gr 2 diast dysfn  . Hypercholesterolemia   . 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 Myocarditis ([place])   . Pericarditis   . PONV (postoperative nausea and vomiting)   . SOB (shortness of breath)     PAST SURGICAL HISTORY: Past Surgical History:  Procedure Laterality Date  . CARDIAC CATHETERIZATION  08/26/2001   EF of 60% --- smooth & normal coronary arteries -- there is a Casler motion defect consistent with posterolateral MI -- she quite possibly had a coronary spasm -- she may have some pericarditis secondary to her MI   . DILATION AND CURETTAGE OF UTERUS  10/15/2003   Uterine enlargement, menorrhagia  . DILATION AND CURETTAGE OF UTERUS  05/27/2002   Dysfunctional uterine bleeding  . LAPAROSCOPY    . NOVASURE ABLATION  10/15/2003   for management of her extreme menorrhagi  . PORTACATH PLACEMENT Right 04/02/2017   Procedure: INSERTION PORT-A-CATH;  Surgeon: Jennifer Overall, MD;  Location: [facility];  Service: General;  Laterality: Right;  . TUBAL LIGATION  09/2000   bilateral    FAMILY HISTORY Family History  Problem Relation Age of Onset  . Hypertension Father   . Diabetes Father   . Other Father        stomach mass suspicious for ca, never bx  . Coronary artery disease Mother   . Kidney cancer Mother 36  . Coronary artery disease Maternal Grandfather   . Colon cancer Maternal Grandfather 61       recurrence  . Coronary artery disease Maternal Uncle   . Coronary artery disease Maternal Uncle   . Diabetes Maternal Uncle   . Colon cancer Maternal Grandmother 35  . Breast cancer Cousin 26       had GT- CHEK2+  . Cervical cancer Maternal Aunt 35  . Diabetes Maternal Aunt   . Alzheimer's disease Paternal Grandmother   . Colon cancer Paternal Grandfather 25  . Breast cancer Paternal Aunt   . Breast cancer Paternal Aunt   . Breast cancer Other 65   Her father died last February 07, 2016 at age 81 just 35 week shy of his 83rd birthday. Her mother is 8  years old as of October 2018. Patient has one brother and no sisters. Her mother was diagnosed with kidney cancer at age 61 with a nephrectomy following. Her maternal grandmother was diagnosed with colon cancer at age 89.  The patient paternal grandfather was diagnosed with colon cancer in his 31's. She has paternal cousins through her fathers half-sisters that have been diagnosed with breast cancer, she is unsure of the number of breast cancer occurrences due to the distance . A maternal cousin was diagnosed with breast cancer at age 46 and she had genetic testing. The patient does have a copy of this testing as well as her PCP.  This was not available for review on the nasal  GYNECOLOGIC HISTORY:  No LMP recorded. Patient has had an ablation.   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 Her husband Jennifer Beck is a Airline pilot. Her daughter [name] is 68 and currently a sophomore at [employer]. 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  The patient attends [employer]. She lives in [place].      ADVANCED DIRECTIVES:    HEALTH MAINTENANCE: Social History   Tobacco Use  . Smoking status: Never Smoker  . Smokeless tobacco: Never Used  Substance Use Topics  . Alcohol use: Yes    Comment: Occasionally drinks wine  . Drug use: No     Colonoscopy: Yes, 2003  PAP: March 2018   Bone density: N/A   Allergies  Allergen Reactions  . Codeine Itching  . Biaxin [Clarithromycin]     Heart Burn    Current Outpatient Medications  Medication Sig Dispense Refill  . acetaminophen (TYLENOL) 325 MG tablet Take 650 mg by mouth every 6 (six) hours as needed.    . ALPRAZolam (XANAX) 0.25 MG tablet Take 0.25 mg  by mouth daily as needed for anxiety.    [name] azithromycin (ZITHROMAX Z-PAK) 250 MG tablet Take as directed 6 each 0  . chlorpheniramine-HYDROcodone (TUSSIONEX) 10-8 MG/5ML SUER Take 5 mLs by mouth at bedtime as needed for cough. 140 mL 0  . Cholecalciferol (VITAMIN D) 2000 units CAPS Take 2,000 Units by mouth daily.    [name] dexamethasone (DECADRON) 4 MG tablet Take 2 tablets by mouth once a day on the day after chemotherapy and then take 2 tablets two times a day for 2 days. Take with food. 30 tablet 1  . Diphenhyd-Hydrocort-Nystatin (FIRST-DUKES MOUTHWASH) SUSP 5-10 ml qid prn swish swallow or spit 240 mL 3  . Evening Primrose Oil 1000 MG CAPS Take 1 capsule by mouth every evening.     . famotidine (PEPCID) 10 MG tablet Take 10 mg by mouth 2 (two) times daily.    . fluocinonide cream (LIDEX) 1.74 % Apply 1 application topically 2 (two) times daily as needed.     [name] glucosamine-chondroitin 500-400 MG tablet Take 1 tablet by mouth daily.     [name] ibuprofen (ADVIL,MOTRIN) 200 MG tablet Take 400 mg by mouth every 8 (eight) hours as needed for headache or mild pain.     [name] lidocaine-prilocaine (EMLA) cream Apply to affected area once 30 g 3  . LORazepam (ATIVAN) 0.5 MG tablet Take 1 tablet (0.5 mg total) by mouth at bedtime as needed (Nausea or vomiting). 30 tablet 0  . Melatonin 5 MG TABS Take 5 mg by mouth at bedtime as needed (sleep).     . Multiple Minerals-Vitamins (CALCIUM-MAGNESIUM-ZINC) TABS Take 1 tablet by mouth daily.     . Multiple Vitamins-Minerals (MULTIVITAMINS THER. W/MINERALS) TABS Take 1 tablet by mouth daily.     . Omega-3 Fatty Acids (FISH OIL) 1000 MG CAPS Take 1,000 mg by mouth 2 (two) times a week.     [name] omeprazole (PRILOSEC) 40 MG capsule Take 1 capsule (40 mg total) by mouth 2 (two) times daily. (Patient taking differently: Take 40 mg by mouth daily. ) 60 capsule 0  . ondansetron (ZOFRAN) 8 MG tablet Take 1 tablet (8 mg total) by mouth every 8 (eight) hours as needed for nausea or  vomiting. 20 tablet 3  . Polyethyl Glycol-Propyl Glycol (SYSTANE OP) Apply 1 drop to eye 2 (two) times daily.    . prochlorperazine (COMPAZINE) 10 MG  tablet Take 1 tablet (10 mg total) by mouth every 6 (six) hours as needed (Nausea or vomiting). 30 tablet 1  . VITAMIN E PO Take 1 tablet by mouth daily.      No current facility-administered medications for this visit.     OBJECTIVE:   Vitals:   05/23/17 1411  BP: 112/72  Pulse: 89  Resp: 18  Temp: 98.5 F (36.9 C)  SpO2: 100%     Body mass index is 27.87 kg/m.   Wt Readings from Last 3 Encounters:  05/23/17 167 lb 8 oz (76 kg)  05/16/17 161 lb 9.6 oz (73.3 kg)  05/09/17 163 lb 4.8 oz (74.1 kg)      ECOG FS:1 GENERAL: Patient is a well appearing female in no acute distress HEENT:  Sclerae anicteric.  Oropharynx clear and moist. No ulcerations or evidence of oropharyngeal candidiasis. Neck is supple. No sinus tenderness to palpation NODES:  No cervical, supraclavicular, or axillary lymphadenopathy palpated.  BREAST EXAM:  Deferred. LUNGS:  Clear to auscultation bilaterally.  No wheezes or rhonchi. HEART:  Regular rate and rhythm. No murmur appreciated. ABDOMEN:  Soft, nontender.  Positive, normoactive bowel sounds. No organomegaly palpated. MSK:  No focal spinal tenderness to palpation. Full range of motion bilaterally in the upper extremities. EXTREMITIES:  No peripheral edema.   SKIN:  Clear with no obvious rashes or skin changes. No nail dyscrasia. NEURO:  Nonfocal. Well oriented.  Appropriate affect.       LAB RESULTS:  CMP     Component Value Date/Time   NA 138 05/16/2017 0801   K 3.9 05/16/2017 0801   CL 106 02/05/2012 1742   CO2 24 05/16/2017 0801   GLUCOSE 107 05/16/2017 0801   BUN 7.3 05/16/2017 0801   CREATININE 0.6 05/16/2017 0801   CALCIUM 8.8 05/16/2017 0801   PROT 6.5 05/16/2017 0801   ALBUMIN 3.6 05/16/2017 0801   AST 11 05/16/2017 0801   ALT 12 05/16/2017 0801   ALKPHOS 129 05/16/2017 0801    BILITOT 0.26 05/16/2017 0801    No results found for: TOTALPROTELP, ALBUMINELP, A1GS, A2GS, BETS, BETA2SER, GAMS, MSPIKE, SPEI  No results found for: KPAFRELGTCHN, LAMBDASER, KAPLAMBRATIO  Lab Results  Component Value Date   WBC 11.8 (H) 05/23/2017   NEUTROABS 9.4 (H) 05/23/2017   HGB 10.8 (L) 05/23/2017   HCT 31.5 (L) 05/23/2017   MCV 95.5 05/23/2017   PLT 225 05/23/2017    '@LASTCHEMISTRY'$ @  No results found for: LABCA2  No components found for: BDZHGD924  No results for input(s): INR in the last 168 hours.  No results found for: LABCA2  No results found for: QAS341  No results found for: DQQ229  No results found for: NLG921  No results found for: CA2729  No components found for: HGQUANT  No results found for: CEA1 / No results found for: CEA1   No results found for: AFPTUMOR  No results found for: CHROMOGRNA  No results found for: PSA1  Appointment on 05/23/2017  Component Date Value Ref Range Status  . WBC 05/23/2017 11.8* 3.9 - 10.3 10e3/uL Final  . NEUT# 05/23/2017 9.4* 1.5 - 6.5 10e3/uL Final  . HGB 05/23/2017 10.8* 11.6 - 15.9 g/dL Final  . HCT 05/23/2017 31.5* 34.8 - 46.6 % Final  . Platelets 05/23/2017 225  145 - 400 10e3/uL Final  . MCV 05/23/2017 95.5  79.5 - 101.0 fL Final  . MCH 05/23/2017 32.8  25.1 - 34.0 pg Final  . MCHC 05/23/2017 34.3  31.5 - 36.0 g/dL Final  . RBC 05/23/2017 3.30* 3.70 - 5.45 10e6/uL Final  . RDW 05/23/2017 13.6  11.2 - 14.5 % Final  . lymph# 05/23/2017 1.2  0.9 - 3.3 10e3/uL Final  . MONO# 05/23/2017 1.1* 0.1 - 0.9 10e3/uL Final  . Eosinophils Absolute 05/23/2017 0.1  0.0 - 0.5 10e3/uL Final  . Basophils Absolute 05/23/2017 0.1  0.0 - 0.1 10e3/uL Final  . NEUT% 05/23/2017 79.7* 38.4 - 76.8 % Final  . LYMPH% 05/23/2017 10.0* 14.0 - 49.7 % Final  . MONO% 05/23/2017 9.1  0.0 - 14.0 % Final  . EOS% 05/23/2017 0.8  0.0 - 7.0 % Final  . BASO% 05/23/2017 0.4  0.0 - 2.0 % Final  . nRBC 05/23/2017 1* 0 - 0 % Final    (this  displays the last labs from the last 3 days)  No results found for: TOTALPROTELP, ALBUMINELP, A1GS, A2GS, BETS, BETA2SER, GAMS, MSPIKE, SPEI (this displays SPEP labs)  No results found for: KPAFRELGTCHN, LAMBDASER, KAPLAMBRATIO (kappa/lambda light chains)  No results found for: HGBA, HGBA2QUANT, HGBFQUANT, HGBSQUAN (Hemoglobinopathy evaluation)   No results found for: LDH  No results found for: IRON, TIBC, IRONPCTSAT (Iron and TIBC)  No results found for: FERRITIN  Urinalysis No results found for: COLORURINE, APPEARANCEUR, LABSPEC, PHURINE, GLUCOSEU, HGBUR, BILIRUBINUR, KETONESUR, PROTEINUR, UROBILINOGEN, NITRITE, LEUKOCYTESUR   STUDIES: No results found.  ELIGIBLE FOR AVAILABLE RESEARCH PROTOCOL:UPBEAT trial --patient refused  ASSESSMENT: 51 y.o. [place] woman status post right breast upper inner quadrant biopsy March 21, 2017 for a clinical T2 N0  Stage IIB invasive ductal carcinoma, grade 3, triple negative, with an MIB-1 of 80%.  (a) biopsy of 2 additional suspicious areas in the right breast and one in the left breast scheduled for April 11, 2017  (1) neoadjuvant chemotherapy will consist of doxorubicin and cyclophosphamide in dose dense fashion x4, followed by paclitaxel/carboplatin weekly x12  (2) definitive surgery to follow  (3) adjuvant radiation as appropriate  (4) genetics testing 04/22/2017 through the common hereditary cancer panel plus renal/urinary tract cancel panel showed no deleterious mutations in APC, ATM, AXIN2, BAP1, BARD1, BMPR1A, BRCA1, BRCA2, BRIP1, BUB1B, CDC73, CDH1, CDK4, CDKN1C, CDKN2A (p14ARF), CDKN2A (p16INK4a), CEP57, CHEK2, CTNNA1, DICER1, DIS3L2, EPCAM*, FH, FLCN, GPC3, GREM1*, KIT, MEN1, MET, MLH1, MSH2, MSH3, MSH6, MUTYH, NBN, NF1, PALB2, PDGFRA, PMS2, POLD1, POLE, PTEN, RAD50, RAD51C, RAD51D, SDHB, SDHC, SDHD, SMAD4, SMARCA4, SMARCB1, STK11, TP53, TSC1, TSC2, VHL, WT1 The following genes were evaluated for sequence changes  only: HOXB13*, MITF*, NTHL1*, SDHA  (a) there was a Variant of Uncertain Significance identified in POLD1, namely c.2953C>T (p.Arg985Trp)  PLAN: Graceanne is doing well today.  Her CBC is stable and I reviewed it with her in detail.  She will proceed with chemotherapy today (assuming the CMET is within parameters).  She will return in 2 days for Neulasta and in 1 week for labs and f/u with Dr. Jana Hakim.    Mannat knows to call for any questions or concerns prior to her next appointment with [place].    A total of (20) minutes of face-to-face time was spent with this patient with greater than 50% of that time in counseling and care-coordination.   Wilber Bihari, NP  05/23/17 2:22 PM Medical Oncology and Hematology [facility] [address] [place], [place] [address] Tel. [phone]    Fax. [phone]

## 2017-05-24 ENCOUNTER — Other Ambulatory Visit: Payer: 59

## 2017-05-24 ENCOUNTER — Ambulatory Visit: Payer: 59

## 2017-05-24 ENCOUNTER — Encounter: Payer: Self-pay | Admitting: Adult Health

## 2017-05-25 ENCOUNTER — Ambulatory Visit (HOSPITAL_BASED_OUTPATIENT_CLINIC_OR_DEPARTMENT_OTHER): Payer: 59

## 2017-05-25 VITALS — BP 128/71 | HR 89 | Temp 97.9°F | Resp 17

## 2017-05-25 DIAGNOSIS — Z5189 Encounter for other specified aftercare: Secondary | ICD-10-CM

## 2017-05-25 DIAGNOSIS — C50411 Malignant neoplasm of upper-outer quadrant of right female breast: Secondary | ICD-10-CM

## 2017-05-25 DIAGNOSIS — Z171 Estrogen receptor negative status [ER-]: Principal | ICD-10-CM

## 2017-05-25 MED ORDER — PEGFILGRASTIM INJECTION 6 MG/0.6ML ~~LOC~~
6.0000 mg | PREFILLED_SYRINGE | Freq: Once | SUBCUTANEOUS | Status: AC
  Administered 2017-05-25: 6 mg via SUBCUTANEOUS

## 2017-05-25 MED ORDER — PEGFILGRASTIM INJECTION 6 MG/0.6ML ~~LOC~~
PREFILLED_SYRINGE | SUBCUTANEOUS | Status: AC
Start: 2017-05-25 — End: ?
  Filled 2017-05-25: qty 0.6

## 2017-05-25 NOTE — Patient Instructions (Addendum)
Pegfilgrastim injection (Neulasta) What is this medicine? PEGFILGRASTIM (PEG fil gra stim) is a long-acting granulocyte colony-stimulating factor that stimulates the growth of neutrophils, a type of white blood cell important in the body's fight against infection. It is used to reduce the incidence of fever and infection in patients with certain types of cancer who are receiving chemotherapy that affects the bone marrow, and to increase survival after being exposed to high doses of radiation. This medicine may be used for other purposes; ask your health care provider or pharmacist if you have questions. COMMON BRAND NAME(S): Neulasta What should I tell my health care provider before I take this medicine? They need to know if you have any of these conditions: -kidney disease -latex allergy -ongoing radiation therapy -sickle cell disease -skin reactions to acrylic adhesives (On-Body Injector only) -an unusual or allergic reaction to pegfilgrastim, filgrastim, other medicines, foods, dyes, or preservatives -pregnant or trying to get pregnant -breast-feeding How should I use this medicine? This medicine is for injection under the skin. If you get this medicine at home, you will be taught how to prepare and give the pre-filled syringe or how to use the On-body Injector. Refer to the patient Instructions for Use for detailed instructions. Use exactly as directed. Tell your healthcare provider immediately if you suspect that the On-body Injector may not have performed as intended or if you suspect the use of the On-body Injector resulted in a missed or partial dose. It is important that you put your used needles and syringes in a special sharps container. Do not put them in a trash can. If you do not have a sharps container, call your pharmacist or healthcare provider to get one. Talk to your pediatrician regarding the use of this medicine in children. While this drug may be prescribed for selected  conditions, precautions do apply. Overdosage: If you think you have taken too much of this medicine contact a poison control center or emergency room at once. NOTE: This medicine is only for you. Do not share this medicine with others. What if I miss a dose? It is important not to miss your dose. Call your doctor or health care professional if you miss your dose. If you miss a dose due to an On-body Injector failure or leakage, a new dose should be administered as soon as possible using a single prefilled syringe for manual use. What may interact with this medicine? Interactions have not been studied. Give your health care provider a list of all the medicines, herbs, non-prescription drugs, or dietary supplements you use. Also tell them if you smoke, drink alcohol, or use illegal drugs. Some items may interact with your medicine. This list may not describe all possible interactions. Give your health care provider a list of all the medicines, herbs, non-prescription drugs, or dietary supplements you use. Also tell them if you smoke, drink alcohol, or use illegal drugs. Some items may interact with your medicine. What should I watch for while using this medicine? You may need blood work done while you are taking this medicine. If you are going to need a MRI, CT scan, or other procedure, tell your doctor that you are using this medicine (On-Body Injector only). What side effects may I notice from receiving this medicine? Side effects that you should report to your doctor or health care professional as soon as possible: -allergic reactions like skin rash, itching or hives, swelling of the face, lips, or tongue -dizziness -fever -pain, redness, or irritation at   site where injected -pinpoint red spots on the skin -red or dark-brown urine -shortness of breath or breathing problems -stomach or side pain, or pain at the shoulder -swelling -tiredness -trouble passing urine or change in the amount of  urine Side effects that usually do not require medical attention (report to your doctor or health care professional if they continue or are bothersome): -bone pain -muscle pain This list may not describe all possible side effects. Call your doctor for medical advice about side effects. You may report side effects to FDA at 1-800-FDA-1088. Where should I keep my medicine? Keep out of the reach of children. Store pre-filled syringes in a refrigerator between 2 and 8 degrees C (36 and 46 degrees F). Do not freeze. Keep in carton to protect from light. Throw away this medicine if it is left out of the refrigerator for more than 48 hours. Throw away any unused medicine after the expiration date. NOTE: This sheet is a summary. It may not cover all possible information. If you have questions about this medicine, talk to your doctor, pharmacist, or health care provider.  2018 Elsevier/Gold Standard (2016-05-10 12:58:03)  

## 2017-05-30 ENCOUNTER — Ambulatory Visit (HOSPITAL_BASED_OUTPATIENT_CLINIC_OR_DEPARTMENT_OTHER): Payer: 59 | Admitting: Oncology

## 2017-05-30 ENCOUNTER — Ambulatory Visit (HOSPITAL_BASED_OUTPATIENT_CLINIC_OR_DEPARTMENT_OTHER): Payer: 59

## 2017-05-30 ENCOUNTER — Other Ambulatory Visit (HOSPITAL_BASED_OUTPATIENT_CLINIC_OR_DEPARTMENT_OTHER): Payer: 59

## 2017-05-30 ENCOUNTER — Other Ambulatory Visit: Payer: 59

## 2017-05-30 ENCOUNTER — Telehealth: Payer: Self-pay | Admitting: Oncology

## 2017-05-30 VITALS — BP 118/60 | HR 94 | Temp 98.8°F | Resp 17 | Wt 161.9 lb

## 2017-05-30 DIAGNOSIS — Z171 Estrogen receptor negative status [ER-]: Principal | ICD-10-CM

## 2017-05-30 DIAGNOSIS — C50411 Malignant neoplasm of upper-outer quadrant of right female breast: Secondary | ICD-10-CM

## 2017-05-30 LAB — CBC WITH DIFFERENTIAL/PLATELET
BASO%: 0.4 % (ref 0.0–2.0)
Basophils Absolute: 0 10*3/uL (ref 0.0–0.1)
EOS%: 1.7 % (ref 0.0–7.0)
Eosinophils Absolute: 0.1 10*3/uL (ref 0.0–0.5)
HCT: 30.7 % — ABNORMAL LOW (ref 34.8–46.6)
HGB: 10.5 g/dL — ABNORMAL LOW (ref 11.6–15.9)
LYMPH%: 13.8 % — AB (ref 14.0–49.7)
MCH: 32.6 pg (ref 25.1–34.0)
MCHC: 34.2 g/dL (ref 31.5–36.0)
MCV: 95.3 fL (ref 79.5–101.0)
MONO#: 0.4 10*3/uL (ref 0.1–0.9)
MONO%: 7.6 % (ref 0.0–14.0)
NEUT%: 76.5 % (ref 38.4–76.8)
NEUTROS ABS: 3.6 10*3/uL (ref 1.5–6.5)
Platelets: 231 10*3/uL (ref 145–400)
RBC: 3.22 10*6/uL — AB (ref 3.70–5.45)
RDW: 13.4 % (ref 11.2–14.5)
WBC: 4.7 10*3/uL (ref 3.9–10.3)
lymph#: 0.7 10*3/uL — ABNORMAL LOW (ref 0.9–3.3)

## 2017-05-30 LAB — COMPREHENSIVE METABOLIC PANEL
ALBUMIN: 3.7 g/dL (ref 3.5–5.0)
ALT: 14 U/L (ref 0–55)
ANION GAP: 8 meq/L (ref 3–11)
AST: 12 U/L (ref 5–34)
Alkaline Phosphatase: 137 U/L (ref 40–150)
BILIRUBIN TOTAL: 0.38 mg/dL (ref 0.20–1.20)
BUN: 8.9 mg/dL (ref 7.0–26.0)
CO2: 25 mEq/L (ref 22–29)
CREATININE: 0.7 mg/dL (ref 0.6–1.1)
Calcium: 9.4 mg/dL (ref 8.4–10.4)
Chloride: 104 mEq/L (ref 98–109)
Glucose: 122 mg/dl (ref 70–140)
Potassium: 3.8 mEq/L (ref 3.5–5.1)
Sodium: 137 mEq/L (ref 136–145)
TOTAL PROTEIN: 6.4 g/dL (ref 6.4–8.3)

## 2017-05-30 MED ORDER — SODIUM CHLORIDE 0.9% FLUSH
10.0000 mL | Freq: Once | INTRAVENOUS | Status: AC
  Administered 2017-05-30: 10 mL
  Filled 2017-05-30: qty 10

## 2017-05-30 MED ORDER — HEPARIN SOD (PORK) LOCK FLUSH 100 UNIT/ML IV SOLN
250.0000 [IU] | Freq: Once | INTRAVENOUS | Status: AC
  Administered 2017-05-30: 250 [IU]
  Filled 2017-05-30: qty 5

## 2017-05-30 NOTE — Telephone Encounter (Signed)
Gave patient AVS and calendar of upcoming January through March appointments.  °

## 2017-05-30 NOTE — Progress Notes (Signed)
[place]  [phone] [phone]     ID: Jennifer Beck DOB: 1965/07/26  MR#: [id number]  ATF#:[id number]  Patient Care Team: [name], MD as PCP - General (Internal Medicine) [name], MD as Consulting Physician (Dermatology) [name], DC as Referring Physician (Chiropractic Medicine) [name], MD as Consulting Physician (General Surgery) [name], MD as Attending Physician (Radiation Oncology) [name], MD as Consulting Physician (Obstetrics and Gynecology) OTHER MD:  CHIEF COMPLAINT: Triple negative breast cancer  CURRENT TREATMENT: Neoadjuvant chemotherapy   HISTORY OF CURRENT ILLNESS:  From the original intake note:  Jennifer Beck palpated a mass in her right breast sometime in September 2018.[name] She brought this to medical attention and her PCP, [name], set her up on 03/18/2017 for a unilateral right diagnostic mammography with ultrasonography, showing: Breast density D. The palpable right breast mass at 12:30 was indeterminate, and biopsy was warranted at that time. No evidence of right axillary lymphadenopathy was noted. Biopsy of the lesion in question on 03/21/2017 at the right breast 12:30 position showed ([id number])  invasive ductal carcinoma with lymphovascular invasion.  The tumor was grade 3, triple negative, with an MIB-1 of 80%.  She is accompanied by her husband, Jennifer Beck to the office today. She reports that she is doing well overall. She initially felt a lump in September in the shower and notes that she doesn't complete monthly self breast exams. She had a routine mammogram in March 2018 at [facility] and she is followed by Dr. [name].  As far as surgeries, she has had two laparoscopic procedures for endometriosis as well as a bilateral tubal ligation. She still has occasional cramping, painful ovulation, and vaginal spotting that comes and goes. She has had cyst  to her breast before that have been evaluated and ruled as negative. She notes that she still has her tonsils, gallbladder, and appendix. She has a prior hx of pericarditis in 2003 that she notes was brought onby a virus. She has since been cleared by her prior Cardiologist.   She has a Restaurant manager, fast food, [name] that she sees every 5 weeks for maintenance of her neck and back issues. She has a dermatologist, [name] at [facility] and she has had an biopsy of an area from her left chest that resulted as basal cell carcinoma in 2015. She denies having a GI specialist, Cardiologist, or Orthopedist at this time. She denies prior history of seizures, migraines, acid reflux, asthma, emphysema, palpitations or GI issues.    [name] is as detailed below.  INTERVAL HISTORY: Jennifer Beck returns today for follow-up on treatment of her estrogen receptor negative breast cancer. Today is day 8 cycle 4 of 4 planned cycles of cyclophosphamide and doxorubicin.  She is looking ahead to weekly treatments with carboplatin and paclitaxel.  She is tolerating chemotherapy treatment well. However, she has not been tolerating the [place] shot as well. She experiences nausea and discomfort for the first two days after receiving the shot.   REVIEW OF SYSTEMS:  Jennifer Beck is doing well overall. She endorses hiccups and reflux. She is trying to stay active. She has been walking mainly for exercise. She is interested in attending yoga classes. Had an ablation and is currently in menopause. She denies unusual headaches, visual changes, vomiting, or dizziness. There has been no unusual cough, phlegm production, or pleurisy. This been no change in bowel or bladder habits. She denies unexplained fatigue or unexplained  weight loss, bleeding, rash, or fever. A detailed review of systems was otherwise entirely stable.   PAST MEDICAL HISTORY: Past Medical History:  Diagnosis Date  . Chest  pain   . Complication of anesthesia   . Endometriosis   . Family history of breast cancer 04/22/2017  . Family history of colon cancer 04/22/2017  . Family history of kidney cancer 04/22/2017  . Fatigue   . GERD (gastroesophageal reflux disease)   . GI bleed    caused by ischemic colitis following an episode of hypertension  . Heart palpitations   . Hx of echocardiogram    a. echo 9/13:  EF 55-60%, Gr 2 diast dysfn  . Hypercholesterolemia   . Ischemic colitis ([place])   . 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 Myocarditis ([place])   . Pericarditis   . PONV (postoperative nausea and vomiting)   . SOB (shortness of breath)     PAST SURGICAL HISTORY: Past Surgical History:  Procedure Laterality Date  . CARDIAC CATHETERIZATION  08/26/2001   EF of 60% --- smooth & normal coronary arteries -- there is a Nilan motion defect consistent with posterolateral MI -- she quite possibly had a coronary spasm -- she may have some pericarditis secondary to her MI   . DILATION AND CURETTAGE OF UTERUS  10/15/2003   Uterine enlargement, menorrhagia  . DILATION AND CURETTAGE OF UTERUS  05/27/2002   Dysfunctional uterine bleeding  . LAPAROSCOPY    . NOVASURE ABLATION  10/15/2003   for management of her extreme menorrhagi  . PORTACATH PLACEMENT Right 04/02/2017   Procedure: INSERTION PORT-A-CATH;  Surgeon: [name], MD;  Location: [facility];  Service: General;  Laterality: Right;  . TUBAL LIGATION  09/2000   bilateral    FAMILY HISTORY Family History  Problem Relation Age of Onset  . Hypertension Father   . Diabetes Father   . Other Father        stomach mass suspicious for ca, never bx  . Coronary artery disease Mother   . Kidney cancer Mother 21  . Coronary artery disease Maternal Grandfather   . Colon cancer Maternal Grandfather 61       recurrence  . Coronary artery disease Maternal Uncle   . Coronary artery disease Maternal Uncle     . Diabetes Maternal Uncle   . Colon cancer Maternal Grandmother 76  . Breast cancer Cousin 61       had GT- CHEK2+  . Cervical cancer Maternal Aunt 35  . Diabetes Maternal Aunt   . Alzheimer's disease Paternal Grandmother   . Colon cancer Paternal Grandfather 72  . Breast cancer Paternal Aunt   . Breast cancer Paternal Aunt   . Breast cancer Other 74   Her father died last 18-Feb-2016 at age 49 just 61 week shy of his 83rd birthday. Her mother is 90 years old as of October 2018. Patient has one brother and no sisters. Her mother was diagnosed with kidney cancer at age 50 with a nephrectomy following. Her maternal grandmother was diagnosed with colon cancer at age 57.  The patient paternal grandfather was diagnosed with colon cancer in his 60's. She has paternal cousins through her fathers half-sisters that have been diagnosed with breast cancer, she is unsure of the number of breast cancer occurrences due to the distance . A maternal cousin was diagnosed with breast cancer at age 88 and she had genetic testing. The  patient does have a copy of this testing as well as her PCP.  This was not available for review on the nasal  GYNECOLOGIC HISTORY:  No LMP recorded. Patient has had an ablation.   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 Her husband Jennifer Beck is a Airline pilot. Her daughter Jennifer Beck is 76 and currently a sophomore at [employer]. 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  The patient attends [employer]. She lives in [place].      ADVANCED DIRECTIVES:    HEALTH MAINTENANCE: Social History   Tobacco Use  . Smoking status: Never Smoker  . Smokeless tobacco: Never Used  Substance Use  Topics  . Alcohol use: Yes    Comment: Occasionally drinks wine  . Drug use: No     Colonoscopy: Yes, 2003  PAP: March 2018   Bone density: N/A   Allergies  Allergen Reactions  . Codeine Itching  . Biaxin [Clarithromycin]     Heart Burn    Current Outpatient Medications  Medication Sig Dispense Refill  . acetaminophen (TYLENOL) 325 MG tablet Take 650 mg by mouth every 6 (six) hours as needed.    . ALPRAZolam (XANAX) 0.25 MG tablet Take 0.25 mg by mouth daily as needed for anxiety.    [name] azithromycin (ZITHROMAX Z-PAK) 250 MG tablet Take as directed 6 each 0  . chlorpheniramine-HYDROcodone (TUSSIONEX) 10-8 MG/5ML SUER Take 5 mLs by mouth at bedtime as needed for cough. 140 mL 0  . Cholecalciferol (VITAMIN D) 2000 units CAPS Take 2,000 Units by mouth daily.    [name] dexamethasone (DECADRON) 4 MG tablet Take 2 tablets by mouth once a day on the day after chemotherapy and then take 2 tablets two times a day for 2 days. Take with food. 30 tablet 1  . Diphenhyd-Hydrocort-Nystatin (FIRST-DUKES MOUTHWASH) SUSP 5-10 ml qid prn swish swallow or spit 240 mL 3  . Evening Primrose Oil 1000 MG CAPS Take 1 capsule by mouth every evening.     . famotidine (PEPCID) 10 MG tablet Take 10 mg by mouth 2 (two) times daily.    . fluocinonide cream (LIDEX) 6.38 % Apply 1 application topically 2 (two) times daily as needed.     [name] glucosamine-chondroitin 500-400 MG tablet Take 1 tablet by mouth daily.     [name] ibuprofen (ADVIL,MOTRIN) 200 MG tablet Take 400 mg by mouth every 8 (eight) hours as needed for headache or mild pain.     [name] lidocaine-prilocaine (EMLA) cream Apply to affected area once 30 g 3  . LORazepam (ATIVAN) 0.5 MG tablet Take 1 tablet (0.5 mg total) by mouth at bedtime as needed (Nausea or vomiting). 30 tablet 0  . Melatonin 5 MG TABS Take 5 mg by mouth at bedtime as needed (sleep).     . Multiple Minerals-Vitamins (CALCIUM-MAGNESIUM-ZINC) TABS Take 1 tablet by mouth daily.     . Multiple  Vitamins-Minerals (MULTIVITAMINS THER. W/MINERALS) TABS Take 1 tablet by mouth daily.     . Omega-3 Fatty Acids (FISH OIL) 1000 MG CAPS Take 1,000 mg by mouth 2 (two) times a week.     [name] omeprazole (PRILOSEC) 40 MG capsule Take 1 capsule (40 mg  total) by mouth 2 (two) times daily. (Patient taking differently: Take 40 mg by mouth daily. ) 60 capsule 0  . ondansetron (ZOFRAN) 8 MG tablet Take 1 tablet (8 mg total) by mouth every 8 (eight) hours as needed for nausea or vomiting. 20 tablet 3  . Polyethyl Glycol-Propyl Glycol (SYSTANE OP) Apply 1 drop to eye 2 (two) times daily.    . prochlorperazine (COMPAZINE) 10 MG tablet Take 1 tablet (10 mg total) by mouth every 6 (six) hours as needed (Nausea or vomiting). 30 tablet 1  . VITAMIN E PO Take 1 tablet by mouth daily.      No current facility-administered medications for this visit.     OBJECTIVE: Middle-aged white woman who appears younger than stated age  63:   05/30/17 1304  BP: 118/60  Pulse: 94  Resp: 17  Temp: 98.8 F (37.1 C)  SpO2: 100%     Body mass index is 26.94 kg/m.   Wt Readings from Last 3 Encounters:  05/30/17 161 lb 14.4 oz (73.4 kg)  05/23/17 167 lb 8 oz (76 kg)  05/16/17 161 lb 9.6 oz (73.3 kg)      ECOG FS:0  Sclerae unicteric, EOMs intact Oropharynx clear and moist No cervical or supraclavicular adenopathy Lungs no rales or rhonchi Heart regular rate and rhythm Abd soft, nontender, positive bowel sounds MSK no focal spinal tenderness, no upper extremity lymphedema Neuro: nonfocal, well oriented, appropriate affect Breasts: The right breast is lumpy but so is the left breast and if I did not know the patient had a right breast cancer I would have found the exam unremarkable.  Both axillae are benign   LAB RESULTS:  CMP     Component Value Date/Time   NA 137 05/30/2017 1219   K 3.8 05/30/2017 1219   CL 106 02/05/2012 1742   CO2 25 05/30/2017 1219   GLUCOSE 122 05/30/2017 1219   BUN 8.9 05/30/2017  1219   CREATININE 0.7 05/30/2017 1219   CALCIUM 9.4 05/30/2017 1219   PROT 6.4 05/30/2017 1219   ALBUMIN 3.7 05/30/2017 1219   AST 12 05/30/2017 1219   ALT 14 05/30/2017 1219   ALKPHOS 137 05/30/2017 1219   BILITOT 0.38 05/30/2017 1219    No results found for: TOTALPROTELP, ALBUMINELP, A1GS, A2GS, BETS, BETA2SER, GAMS, MSPIKE, SPEI  No results found for: KPAFRELGTCHN, LAMBDASER, KAPLAMBRATIO  Lab Results  Component Value Date   WBC 4.7 05/30/2017   NEUTROABS 3.6 05/30/2017   HGB 10.5 (L) 05/30/2017   HCT 30.7 (L) 05/30/2017   MCV 95.3 05/30/2017   PLT 231 05/30/2017    _0 @  No results found for: LABCA2  No components found for: EGBTDV761  No results for input(s): INR in the last 168 hours.  No results found for: LABCA2  No results found for: YWV371  No results found for: GGY694  No results found for: WNI627  No results found for: CA2729  No components found for: HGQUANT  No results found for: CEA1 / No results found for: CEA1   No results found for: AFPTUMOR  No results found for: CHROMOGRNA  No results found for: PSA1  Appointment on 05/30/2017  Component Date Value Ref Range Status  . Sodium 05/30/2017 137  136 - 145 mEq/L Final  . Potassium 05/30/2017 3.8  3.5 - 5.1 mEq/L Final  . Chloride 05/30/2017 104  98 - 109 mEq/L Final  . CO2 05/30/2017 25  22 - 29 mEq/L Final  . Glucose 05/30/2017 122  70 - 140 mg/dl Final   Glucose reference range is for nonfasting patients. Fasting glucose reference range is 70- 100.  [name] BUN 05/30/2017 8.9  7.0 - 26.0 mg/dL Final  . Creatinine 05/30/2017 0.7  0.6 - 1.1 mg/dL Final  . Total Bilirubin 05/30/2017 0.38  0.20 - 1.20 mg/dL Final  . Alkaline Phosphatase 05/30/2017 137  40 - 150 U/L Final  . AST 05/30/2017 12  5 - 34 U/L Final  . ALT 05/30/2017 14  0 - 55 U/L Final  . Total Protein 05/30/2017 6.4  6.4 - 8.3 g/dL Final  . Albumin 05/30/2017 3.7  3.5 - 5.0 g/dL Final  . Calcium 05/30/2017 9.4  8.4 -  10.4 mg/dL Final  . Anion Gap 05/30/2017 8  3 - 11 mEq/L Final  . EGFR 05/30/2017 >60  >60 ml/min/1.73 m2 Final   eGFR is calculated using the CKD-EPI Creatinine Equation (2009)  . WBC 05/30/2017 4.7  3.9 - 10.3 10e3/uL Final  . NEUT# 05/30/2017 3.6  1.5 - 6.5 10e3/uL Final  . HGB 05/30/2017 10.5* 11.6 - 15.9 g/dL Final  . HCT 05/30/2017 30.7* 34.8 - 46.6 % Final  . Platelets 05/30/2017 231  145 - 400 10e3/uL Final  . MCV 05/30/2017 95.3  79.5 - 101.0 fL Final  . MCH 05/30/2017 32.6  25.1 - 34.0 pg Final  . MCHC 05/30/2017 34.2  31.5 - 36.0 g/dL Final  . RBC 05/30/2017 3.22* 3.70 - 5.45 10e6/uL Final  . RDW 05/30/2017 13.4  11.2 - 14.5 % Final  . lymph# 05/30/2017 0.7* 0.9 - 3.3 10e3/uL Final  . MONO# 05/30/2017 0.4  0.1 - 0.9 10e3/uL Final  . Eosinophils Absolute 05/30/2017 0.1  0.0 - 0.5 10e3/uL Final  . Basophils Absolute 05/30/2017 0.0  0.0 - 0.1 10e3/uL Final  . NEUT% 05/30/2017 76.5  38.4 - 76.8 % Final  . LYMPH% 05/30/2017 13.8* 14.0 - 49.7 % Final  . MONO% 05/30/2017 7.6  0.0 - 14.0 % Final  . EOS% 05/30/2017 1.7  0.0 - 7.0 % Final  . BASO% 05/30/2017 0.4  0.0 - 2.0 % Final    (this displays the last labs from the last 3 days)  No results found for: TOTALPROTELP, ALBUMINELP, A1GS, A2GS, BETS, BETA2SER, GAMS, MSPIKE, SPEI (this displays SPEP labs)  No results found for: KPAFRELGTCHN, LAMBDASER, KAPLAMBRATIO (kappa/lambda light chains)  No results found for: HGBA, HGBA2QUANT, HGBFQUANT, HGBSQUAN (Hemoglobinopathy evaluation)   No results found for: LDH  No results found for: IRON, TIBC, IRONPCTSAT (Iron and TIBC)  No results found for: FERRITIN  Urinalysis No results found for: COLORURINE, APPEARANCEUR, LABSPEC, PHURINE, GLUCOSEU, HGBUR, BILIRUBINUR, KETONESUR, PROTEINUR, UROBILINOGEN, NITRITE, LEUKOCYTESUR   STUDIES: No results found.  ELIGIBLE FOR AVAILABLE RESEARCH PROTOCOL:UPBEAT trial --patient refused  ASSESSMENT: 52 y.o. [place] woman status post  right breast upper inner quadrant biopsy March 21, 2017 for a clinical T2 N0  Stage IIB invasive ductal carcinoma, grade 3, triple negative, with an MIB-1 of 80%.  (a) biopsy of 2 additional suspicious areas in the right breast and one in the left breast scheduled for April 11, 2017  (1) neoadjuvant chemotherapy will consist of doxorubicin and cyclophosphamide in dose dense fashion x4 completed 05/23/2017, followed by paclitaxel/carboplatin weekly x12, first dose 06/06/2017  (2) definitive surgery to follow  (3) adjuvant radiation as appropriate  (4) genetics testing 04/22/2017 through the common hereditary cancer panel plus renal/urinary tract cancel panel showed no deleterious mutations in APC, ATM, AXIN2, BAP1, BARD1, BMPR1A, BRCA1,  BRCA2, BRIP1, BUB1B, CDC73, CDH1, CDK4, CDKN1C, CDKN2A (p14ARF), CDKN2A (p16INK4a), CEP57, CHEK2, CTNNA1, DICER1, DIS3L2, EPCAM*, FH, FLCN, GPC3, GREM1*, KIT, MEN1, MET, MLH1, MSH2, MSH3, MSH6, MUTYH, NBN, NF1, PALB2, PDGFRA, PMS2, POLD1, POLE, PTEN, RAD50, RAD51C, RAD51D, SDHB, SDHC, SDHD, SMAD4, SMARCA4, SMARCB1, STK11, TP53, TSC1, TSC2, VHL, WT1 The following genes were evaluated for sequence changes only: HOXB13*, MITF*, NTHL1*, SDHA  (a) there was a Variant of Uncertain Significance identified in POLD1, namely c.2953C>T (p.Arg985Trp)  PLAN: Nakeysha did remarkably well with her cyclophosphamide.  Hopefully she will do equally well with the treatments.  We discussed the possible toxicity side effects and complications of carboplatin and paclitaxel in detail.  Because she receives palonosetron as a premed she will not be able to take ondansetron for the first 3 days but she will use Compazine pretty much on an as-needed basis.  I do not think she will need the dexamethasone.  She has a very good understanding of how to manage side effects so I am canceling her 06/06/2017 appointment with me.  That will be day 1 cycle 1.  She will see me a week later with  cycle 2.  I offered her to take a week off every second or third treatment, but she would like to run the treatments through as much as possible and there is of course nothing wrong with that so long as her counts hold up and she does not develop peripheral neuropathy issues  We discussed the fact that she is currently in menopause but she may not stay in it.  In any case she is status post bilateral tubal ligation.  She knows to call for any other issues that may develop before the next  Magrinat, Virgie Dad, MD  05/30/17 1:29 PM Medical Oncology and Hematology [facility] [place], [place] [address] Tel. [phone]    Fax. [phone]  This document serves as a record of services personally performed by Chauncey Cruel, MD. It was created on his behalf by Margit Banda, a trained medical scribe. The creation of this record is based on the scribe's personal observations and the provider's statements to them.   I have reviewed the above documentation for accuracy and completeness, and I agree with the above.

## 2017-05-31 DIAGNOSIS — L853 Xerosis cutis: Secondary | ICD-10-CM | POA: Diagnosis not present

## 2017-05-31 DIAGNOSIS — D1801 Hemangioma of skin and subcutaneous tissue: Secondary | ICD-10-CM | POA: Diagnosis not present

## 2017-05-31 DIAGNOSIS — L814 Other melanin hyperpigmentation: Secondary | ICD-10-CM | POA: Diagnosis not present

## 2017-06-06 ENCOUNTER — Inpatient Hospital Stay: Payer: 59

## 2017-06-06 ENCOUNTER — Inpatient Hospital Stay: Payer: 59 | Attending: Oncology

## 2017-06-06 ENCOUNTER — Other Ambulatory Visit: Payer: 59

## 2017-06-06 ENCOUNTER — Ambulatory Visit: Payer: 59 | Admitting: Oncology

## 2017-06-06 VITALS — BP 117/68 | HR 91 | Temp 98.5°F | Resp 17

## 2017-06-06 DIAGNOSIS — Z803 Family history of malignant neoplasm of breast: Secondary | ICD-10-CM | POA: Insufficient documentation

## 2017-06-06 DIAGNOSIS — C50411 Malignant neoplasm of upper-outer quadrant of right female breast: Secondary | ICD-10-CM

## 2017-06-06 DIAGNOSIS — Z171 Estrogen receptor negative status [ER-]: Principal | ICD-10-CM

## 2017-06-06 DIAGNOSIS — Z800 Family history of malignant neoplasm of digestive organs: Secondary | ICD-10-CM | POA: Insufficient documentation

## 2017-06-06 DIAGNOSIS — R530 Neoplastic (malignant) related fatigue: Secondary | ICD-10-CM | POA: Diagnosis not present

## 2017-06-06 DIAGNOSIS — N809 Endometriosis, unspecified: Secondary | ICD-10-CM | POA: Diagnosis not present

## 2017-06-06 DIAGNOSIS — Z5111 Encounter for antineoplastic chemotherapy: Secondary | ICD-10-CM | POA: Insufficient documentation

## 2017-06-06 DIAGNOSIS — Z302 Encounter for sterilization: Secondary | ICD-10-CM | POA: Insufficient documentation

## 2017-06-06 DIAGNOSIS — Z8051 Family history of malignant neoplasm of kidney: Secondary | ICD-10-CM | POA: Insufficient documentation

## 2017-06-06 DIAGNOSIS — Z85828 Personal history of other malignant neoplasm of skin: Secondary | ICD-10-CM | POA: Diagnosis not present

## 2017-06-06 DIAGNOSIS — Z79899 Other long term (current) drug therapy: Secondary | ICD-10-CM | POA: Diagnosis not present

## 2017-06-06 LAB — COMPREHENSIVE METABOLIC PANEL
ALT: 18 U/L (ref 0–55)
AST: 15 U/L (ref 5–34)
Albumin: 3.9 g/dL (ref 3.5–5.0)
Alkaline Phosphatase: 116 U/L (ref 40–150)
Anion gap: 8 (ref 3–11)
BUN: 11 mg/dL (ref 7–26)
CHLORIDE: 105 mmol/L (ref 98–109)
CO2: 26 mmol/L (ref 22–29)
Calcium: 9.4 mg/dL (ref 8.4–10.4)
Creatinine, Ser: 0.8 mg/dL (ref 0.60–1.10)
Glucose, Bld: 146 mg/dL — ABNORMAL HIGH (ref 70–140)
POTASSIUM: 3.5 mmol/L (ref 3.3–4.7)
Sodium: 139 mmol/L (ref 136–145)
Total Bilirubin: 0.3 mg/dL (ref 0.2–1.2)
Total Protein: 6.8 g/dL (ref 6.4–8.3)

## 2017-06-06 LAB — CBC WITH DIFFERENTIAL/PLATELET
Basophils Absolute: 0.1 10*3/uL (ref 0.0–0.1)
Basophils Relative: 1 %
EOS PCT: 1 %
Eosinophils Absolute: 0.1 10*3/uL (ref 0.0–0.5)
HEMATOCRIT: 34.6 % — AB (ref 34.8–46.6)
Hemoglobin: 11.7 g/dL (ref 11.6–15.9)
LYMPHS ABS: 1.1 10*3/uL (ref 0.9–3.3)
LYMPHS PCT: 8 %
MCH: 32.4 pg (ref 25.1–34.0)
MCHC: 33.7 g/dL (ref 31.5–36.0)
MCV: 96.2 fL (ref 79.5–101.0)
MONO ABS: 1.1 10*3/uL — AB (ref 0.1–0.9)
Monocytes Relative: 8 %
Neutro Abs: 11 10*3/uL — ABNORMAL HIGH (ref 1.5–6.5)
Neutrophils Relative %: 82 %
PLATELETS: 250 10*3/uL (ref 145–400)
RBC: 3.6 MIL/uL — ABNORMAL LOW (ref 3.70–5.45)
RDW: 14.2 % (ref 11.2–16.1)
WBC: 13.3 10*3/uL — ABNORMAL HIGH (ref 3.9–10.3)

## 2017-06-06 MED ORDER — SODIUM CHLORIDE 0.9% FLUSH
10.0000 mL | INTRAVENOUS | Status: DC | PRN
  Administered 2017-06-06: 10 mL
  Filled 2017-06-06: qty 10

## 2017-06-06 MED ORDER — DIPHENHYDRAMINE HCL 50 MG/ML IJ SOLN
25.0000 mg | Freq: Once | INTRAMUSCULAR | Status: AC
  Administered 2017-06-06: 25 mg via INTRAVENOUS

## 2017-06-06 MED ORDER — FAMOTIDINE IN NACL 20-0.9 MG/50ML-% IV SOLN
20.0000 mg | Freq: Once | INTRAVENOUS | Status: AC
  Administered 2017-06-06: 20 mg via INTRAVENOUS

## 2017-06-06 MED ORDER — SODIUM CHLORIDE 0.9% FLUSH
10.0000 mL | Freq: Once | INTRAVENOUS | Status: AC
  Administered 2017-06-06: 10 mL
  Filled 2017-06-06: qty 10

## 2017-06-06 MED ORDER — FAMOTIDINE IN NACL 20-0.9 MG/50ML-% IV SOLN
INTRAVENOUS | Status: AC
  Filled 2017-06-06: qty 50

## 2017-06-06 MED ORDER — SODIUM CHLORIDE 0.9 % IV SOLN
249.2000 mg | Freq: Once | INTRAVENOUS | Status: AC
  Administered 2017-06-06: 250 mg via INTRAVENOUS
  Filled 2017-06-06: qty 25

## 2017-06-06 MED ORDER — PALONOSETRON HCL INJECTION 0.25 MG/5ML
INTRAVENOUS | Status: AC
  Filled 2017-06-06: qty 5

## 2017-06-06 MED ORDER — SODIUM CHLORIDE 0.9 % IV SOLN
Freq: Once | INTRAVENOUS | Status: AC
  Administered 2017-06-06: 15:00:00 via INTRAVENOUS

## 2017-06-06 MED ORDER — HEPARIN SOD (PORK) LOCK FLUSH 100 UNIT/ML IV SOLN
500.0000 [IU] | Freq: Once | INTRAVENOUS | Status: AC | PRN
  Administered 2017-06-06: 500 [IU]
  Filled 2017-06-06: qty 5

## 2017-06-06 MED ORDER — DIPHENHYDRAMINE HCL 50 MG/ML IJ SOLN
INTRAMUSCULAR | Status: AC
  Filled 2017-06-06: qty 1

## 2017-06-06 MED ORDER — PALONOSETRON HCL INJECTION 0.25 MG/5ML
0.2500 mg | Freq: Once | INTRAVENOUS | Status: AC
  Administered 2017-06-06: 0.25 mg via INTRAVENOUS

## 2017-06-06 MED ORDER — SODIUM CHLORIDE 0.9 % IV SOLN
20.0000 mg | Freq: Once | INTRAVENOUS | Status: AC
  Administered 2017-06-06: 20 mg via INTRAVENOUS
  Filled 2017-06-06: qty 2

## 2017-06-06 MED ORDER — SODIUM CHLORIDE 0.9 % IV SOLN
80.0000 mg/m2 | Freq: Once | INTRAVENOUS | Status: AC
  Administered 2017-06-06: 150 mg via INTRAVENOUS
  Filled 2017-06-06: qty 25

## 2017-06-06 NOTE — Patient Instructions (Addendum)
[facility] Discharge Instructions for Patients Receiving Chemotherapy  Today you received the following chemotherapy agents: Paclitaxel (Taxol) and Carboplatin  To help prevent nausea and vomiting after your treatment, we encourage you to take your nausea medication as prescribed.  If you develop nausea and vomiting that is not controlled by your nausea medication, call the clinic.   BELOW ARE SYMPTOMS THAT SHOULD BE REPORTED IMMEDIATELY:  *FEVER GREATER THAN 100.5 F  *CHILLS WITH OR WITHOUT FEVER  NAUSEA AND VOMITING THAT IS NOT CONTROLLED WITH YOUR NAUSEA MEDICATION  *UNUSUAL SHORTNESS OF BREATH  *UNUSUAL BRUISING OR BLEEDING  TENDERNESS IN MOUTH AND THROAT WITH OR WITHOUT PRESENCE OF ULCERS  *URINARY PROBLEMS  *BOWEL PROBLEMS  UNUSUAL RASH Items with * indicate a potential emergency and should be followed up as soon as possible.  Feel free to call the clinic should you have any questions or concerns. The clinic phone number is [phone].  Please show the [facility] at check-in to the Emergency Department and triage nurse.     Carboplatin injection What is this medicine? CARBOPLATIN (KAR boe pla tin) is a chemotherapy drug. It targets fast dividing cells, like cancer cells, and causes these cells to die. This medicine is used to treat ovarian cancer and many other cancers. This medicine may be used for other purposes; ask your health care provider or pharmacist if you have questions. COMMON BRAND NAME(S): Paraplatin What should I tell my health care provider before I take this medicine? They need to know if you have any of these conditions: -blood disorders -hearing problems -kidney disease -recent or ongoing radiation therapy -an unusual or allergic reaction to carboplatin, cisplatin, other chemotherapy, other medicines, foods, dyes, or preservatives -pregnant or trying to get pregnant -breast-feeding How should I use this  medicine? This drug is usually given as an infusion into a vein. It is administered in a hospital or clinic by a specially trained health care professional. Talk to your pediatrician regarding the use of this medicine in children. Special care may be needed. Overdosage: If you think you have taken too much of this medicine contact a poison control center or emergency room at once. NOTE: This medicine is only for you. Do not share this medicine with others. What if I miss a dose? It is important not to miss a dose. Call your doctor or health care professional if you are unable to keep an appointment. What may interact with this medicine? -medicines for seizures -medicines to increase blood counts like filgrastim, pegfilgrastim, sargramostim -some antibiotics like amikacin, gentamicin, neomycin, streptomycin, tobramycin -vaccines Talk to your doctor or health care professional before taking any of these medicines: -acetaminophen -aspirin -ibuprofen -ketoprofen -naproxen This list may not describe all possible interactions. Give your health care provider a list of all the medicines, herbs, non-prescription drugs, or dietary supplements you use. Also tell them if you smoke, drink alcohol, or use illegal drugs. Some items may interact with your medicine. What should I watch for while using this medicine? Your condition will be monitored carefully while you are receiving this medicine. You will need important blood work done while you are taking this medicine. This drug may make you feel generally unwell. This is not uncommon, as chemotherapy can affect healthy cells as well as cancer cells. Report any side effects. Continue your course of treatment even though you feel ill unless your doctor tells you to stop. In some cases, you may be given additional medicines to help  with side effects. Follow all directions for their use. Call your doctor or health care professional for advice if you get a  fever, chills or sore throat, or other symptoms of a cold or flu. Do not treat yourself. This drug decreases your body's ability to fight infections. Try to avoid being around people who are sick. This medicine may increase your risk to bruise or bleed. Call your doctor or health care professional if you notice any unusual bleeding. Be careful brushing and flossing your teeth or using a toothpick because you may get an infection or bleed more easily. If you have any dental work done, tell your dentist you are receiving this medicine. Avoid taking products that contain aspirin, acetaminophen, ibuprofen, naproxen, or ketoprofen unless instructed by your doctor. These medicines may hide a fever. Do not become pregnant while taking this medicine. Women should inform their doctor if they wish to become pregnant or think they might be pregnant. There is a potential for serious side effects to an unborn child. Talk to your health care professional or pharmacist for more information. Do not breast-feed an infant while taking this medicine. What side effects may I notice from receiving this medicine? Side effects that you should report to your doctor or health care professional as soon as possible: -allergic reactions like skin rash, itching or hives, swelling of the face, lips, or tongue -signs of infection - fever or chills, cough, sore throat, pain or difficulty passing urine -signs of decreased platelets or bleeding - bruising, pinpoint red spots on the skin, black, tarry stools, nosebleeds -signs of decreased red blood cells - unusually weak or tired, fainting spells, lightheadedness -breathing problems -changes in hearing -changes in vision -chest pain -high blood pressure -low blood counts - This drug may decrease the number of white blood cells, red blood cells and platelets. You may be at increased risk for infections and bleeding. -nausea and vomiting -pain, swelling, redness or irritation at the  injection site -pain, tingling, numbness in the hands or feet -problems with balance, talking, walking -trouble passing urine or change in the amount of urine Side effects that usually do not require medical attention (report to your doctor or health care professional if they continue or are bothersome): -hair loss -loss of appetite -metallic taste in the mouth or changes in taste This list may not describe all possible side effects. Call your doctor for medical advice about side effects. You may report side effects to FDA at 1-800-FDA-1088. Where should I keep my medicine? This drug is given in a hospital or clinic and will not be stored at home. NOTE: This sheet is a summary. It may not cover all possible information. If you have questions about this medicine, talk to your doctor, pharmacist, or health care provider.  2018 [employer] Standard (2007-08-19 14:38:05)     Paclitaxel injection What is this medicine? PACLITAXEL (PAK li TAX el) is a chemotherapy drug. It targets fast dividing cells, like cancer cells, and causes these cells to die. This medicine is used to treat ovarian cancer, breast cancer, and other cancers. This medicine may be used for other purposes; ask your health care provider or pharmacist if you have questions. COMMON BRAND NAME(S): Onxol, Taxol What should I tell my health care provider before I take this medicine? They need to know if you have any of these conditions: -blood disorders -irregular heartbeat -infection (especially a virus infection such as chickenpox, cold sores, or herpes) -liver disease -previous or ongoing radiation  therapy -an unusual or allergic reaction to paclitaxel, alcohol, polyoxyethylated castor oil, other chemotherapy agents, other medicines, foods, dyes, or preservatives -pregnant or trying to get pregnant -breast-feeding How should I use this medicine? This drug is given as an infusion into a vein. It is administered in a  hospital or clinic by a specially trained health care professional. Talk to your pediatrician regarding the use of this medicine in children. Special care may be needed. Overdosage: If you think you have taken too much of this medicine contact a poison control center or emergency room at once. NOTE: This medicine is only for you. Do not share this medicine with others. What if I miss a dose? It is important not to miss your dose. Call your doctor or health care professional if you are unable to keep an appointment. What may interact with this medicine? Do not take this medicine with any of the following medications: -disulfiram -metronidazole This medicine may also interact with the following medications: -cyclosporine -diazepam -ketoconazole -medicines to increase blood counts like filgrastim, pegfilgrastim, sargramostim -other chemotherapy drugs like cisplatin, doxorubicin, epirubicin, etoposide, teniposide, vincristine -quinidine -testosterone -vaccines -verapamil Talk to your doctor or health care professional before taking any of these medicines: -acetaminophen -aspirin -ibuprofen -ketoprofen -naproxen This list may not describe all possible interactions. Give your health care provider a list of all the medicines, herbs, non-prescription drugs, or dietary supplements you use. Also tell them if you smoke, drink alcohol, or use illegal drugs. Some items may interact with your medicine. What should I watch for while using this medicine? Your condition will be monitored carefully while you are receiving this medicine. You will need important blood work done while you are taking this medicine. This medicine can cause serious allergic reactions. To reduce your risk you will need to take other medicine(s) before treatment with this medicine. If you experience allergic reactions like skin rash, itching or hives, swelling of the face, lips, or tongue, tell your doctor or health care  professional right away. In some cases, you may be given additional medicines to help with side effects. Follow all directions for their use. This drug may make you feel generally unwell. This is not uncommon, as chemotherapy can affect healthy cells as well as cancer cells. Report any side effects. Continue your course of treatment even though you feel ill unless your doctor tells you to stop. Call your doctor or health care professional for advice if you get a fever, chills or sore throat, or other symptoms of a cold or flu. Do not treat yourself. This drug decreases your body's ability to fight infections. Try to avoid being around people who are sick. This medicine may increase your risk to bruise or bleed. Call your doctor or health care professional if you notice any unusual bleeding. Be careful brushing and flossing your teeth or using a toothpick because you may get an infection or bleed more easily. If you have any dental work done, tell your dentist you are receiving this medicine. Avoid taking products that contain aspirin, acetaminophen, ibuprofen, naproxen, or ketoprofen unless instructed by your doctor. These medicines may hide a fever. Do not become pregnant while taking this medicine. Women should inform their doctor if they wish to become pregnant or think they might be pregnant. There is a potential for serious side effects to an unborn child. Talk to your health care professional or pharmacist for more information. Do not breast-feed an infant while taking this medicine. Men are advised not  to father a child while receiving this medicine. This product may contain alcohol. Ask your pharmacist or healthcare provider if this medicine contains alcohol. Be sure to tell all healthcare providers you are taking this medicine. Certain medicines, like metronidazole and disulfiram, can cause an unpleasant reaction when taken with alcohol. The reaction includes flushing, headache, nausea, vomiting,  sweating, and increased thirst. The reaction can last from 30 minutes to several hours. What side effects may I notice from receiving this medicine? Side effects that you should report to your doctor or health care professional as soon as possible: -allergic reactions like skin rash, itching or hives, swelling of the face, lips, or tongue -low blood counts - This drug may decrease the number of white blood cells, red blood cells and platelets. You may be at increased risk for infections and bleeding. -signs of infection - fever or chills, cough, sore throat, pain or difficulty passing urine -signs of decreased platelets or bleeding - bruising, pinpoint red spots on the skin, black, tarry stools, nosebleeds -signs of decreased red blood cells - unusually weak or tired, fainting spells, lightheadedness -breathing problems -chest pain -high or low blood pressure -mouth sores -nausea and vomiting -pain, swelling, redness or irritation at the injection site -pain, tingling, numbness in the hands or feet -slow or irregular heartbeat -swelling of the ankle, feet, hands Side effects that usually do not require medical attention (report to your doctor or health care professional if they continue or are bothersome): -bone pain -complete hair loss including hair on your head, underarms, pubic hair, eyebrows, and eyelashes -changes in the color of fingernails -diarrhea -loosening of the fingernails -loss of appetite -muscle or joint pain -red flush to skin -sweating This list may not describe all possible side effects. Call your doctor for medical advice about side effects. You may report side effects to FDA at 1-800-FDA-1088. Where should I keep my medicine? This drug is given in a hospital or clinic and will not be stored at home. NOTE: This sheet is a summary. It may not cover all possible information. If you have questions about this medicine, talk to your doctor, pharmacist, or health care  provider.  2018 [employer] Standard (2015-03-15 19:58:00)

## 2017-06-12 NOTE — Progress Notes (Signed)
[place]  [phone] [phone]) [phone]     ID: Jennifer Beck DOB: 10-28-1965  MR#: [id number]  JOI#:[id number]  Patient Care Team: Jennifer Shirts, MD as PCP - General (Internal Medicine) Jennifer Sine, MD as Consulting Physician (Dermatology) Jennifer Beck, DC as Referring Physician (Chiropractic Medicine) Jennifer Overall, MD as Consulting Physician (General Surgery) Jennifer Gibson, MD as Attending Physician (Radiation Oncology) Jennifer Fus, MD as Consulting Physician (Obstetrics and Gynecology) OTHER MD:  CHIEF COMPLAINT: Triple negative breast cancer  CURRENT TREATMENT: Neoadjuvant chemotherapy   HISTORY OF CURRENT ILLNESS:  From the original intake note:  Jennifer Beck palpated a mass in her right breast sometime in September 2018.[name] She brought this to medical attention and her PCP, Dr. Bing Beck, set her up on 03/18/2017 for a unilateral right diagnostic mammography with ultrasonography, showing: Breast density D. The palpable right breast mass at 12:30 was indeterminate, and biopsy was warranted at that time. No evidence of right axillary lymphadenopathy was noted. Biopsy of the lesion in question on 03/21/2017 at the right breast 12:30 position showed ([id number])  invasive ductal carcinoma with lymphovascular invasion.  The tumor was grade 3, triple negative, with an MIB-1 of 80%.  She is accompanied by her husband, Jennifer Beck to the office today. She reports that she is doing well Beck. She initially felt a lump in September in the shower and notes that she doesn't complete monthly self breast exams. She had a routine mammogram in March 2018 at [facility] and she is followed by Dr. [name].  As far as surgeries, she has had two laparoscopic procedures for endometriosis as well as a bilateral tubal ligation. She still has occasional cramping, painful ovulation, and vaginal spotting that comes and goes. She has had cyst  to her breast before that have been evaluated and ruled as negative. She notes that she still has her tonsils, gallbladder, and appendix. She has a prior hx of pericarditis in 2003 that she notes was brought onby a virus. She has since been cleared by her prior Cardiologist.   She has a Restaurant manager, fast food, Jennifer Beck that she sees every 5 weeks for maintenance of her neck and back issues. She has a dermatologist, Dr. Elvera Beck at [facility] and she has had an biopsy of an area from her left chest that resulted as basal cell carcinoma in 2015. She denies having a GI specialist, Cardiologist, or Orthopedist at this time. She denies prior history of seizures, migraines, acid reflux, asthma, emphysema, palpitations or GI issues.    [name] is as detailed below.  INTERVAL HISTORY: Jennifer Beck returns today for follow-up on treatment of her estrogen receptor negative breast cancer. Today is day 1 cycle 2 of carboplatin and paclitaxel given every 7 days with 12 doses planned. She tolerates this well. She notes that she had no immediate side affects other than redness and tight ness in her fingers and hands. She notes that the redness occurred before starting treatment, and she used lotion to aid dryness. She notes that tightness and burning started after she started treatment, but it has not gotten better over the last few days. She exepriences the tightness and burning when she wakes up in the morning and in the afternoons after work. She denies nausea due to taking compazine the 1st night after chemo. She denies sores in her mouth.   REVIEW OF SYSTEMS:  Jennifer Beck reports that she has continued to work while she is receiving chemotherapy.  She notes that she gets tired by the end of the day, but she hasn't felt completely drained of energy. She notes that she is tolerating this treatment better than the previous chemotherapy she had. She notes that her vision has gotten a  little worse since starting chemo. She notes that she has gas and some constipation which she aids by taking stool softeners and eating more veggietables. She denies a metallic taste in her mouth with this treatment, unlike the previous treatment. She denies unusual headaches, visual changes, nausea, vomiting, or dizziness. There has been no unusual cough, phlegm production, or pleurisy. This been no change in bowel or bladder habits. She denies unexplained fatigue or unexplained weight loss, bleeding, rash, or fever. A detailed review of systems was otherwise stable.    PAST MEDICAL HISTORY: Past Medical History:  Diagnosis Date  . Chest pain   . Complication of anesthesia   . Endometriosis   . Family history of breast cancer 04/22/2017  . Family history of colon cancer 04/22/2017  . Family history of kidney cancer 04/22/2017  . Fatigue   . GERD (gastroesophageal reflux disease)   . GI bleed    caused by ischemic colitis following an episode of hypertension  . Heart palpitations   . Hx of echocardiogram    a. echo 9/13:  EF 55-60%, Gr 2 diast dysfn  . Hypercholesterolemia   . Ischemic colitis ([place])   . 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 Myocarditis ([place])   . Pericarditis   . PONV (postoperative nausea and vomiting)   . SOB (shortness of breath)     PAST SURGICAL HISTORY: Past Surgical History:  Procedure Laterality Date  . CARDIAC CATHETERIZATION  08/26/2001   EF of 60% --- smooth & normal coronary arteries -- there is a Belli motion defect consistent with posterolateral MI -- she quite possibly had a coronary spasm -- she may have some pericarditis secondary to her MI   . DILATION AND CURETTAGE OF UTERUS  10/15/2003   Uterine enlargement, menorrhagia  . DILATION AND CURETTAGE OF UTERUS  05/27/2002   Dysfunctional uterine bleeding  . LAPAROSCOPY    . NOVASURE ABLATION  10/15/2003   for management of her extreme  menorrhagi  . PORTACATH PLACEMENT Right 04/02/2017   Procedure: INSERTION PORT-A-CATH;  Surgeon: Jennifer Overall, MD;  Location: [facility];  Service: General;  Laterality: Right;  . TUBAL LIGATION  09/2000   bilateral    FAMILY HISTORY Family History  Problem Relation Age of Onset  . Hypertension Father   . Diabetes Father   . Other Father        stomach mass suspicious for ca, never bx  . Coronary artery disease Mother   . Kidney cancer Mother 70  . Coronary artery disease Maternal Grandfather   . Colon cancer Maternal Grandfather 61       recurrence  . Coronary artery disease Maternal Uncle   . Coronary artery disease Maternal Uncle   . Diabetes Maternal Uncle   . Colon cancer Maternal Grandmother 8  . Breast cancer Cousin 54       had GT- CHEK2+  . Cervical cancer Maternal Aunt 35  . Diabetes Maternal Aunt   . Alzheimer's disease Paternal Grandmother   . Colon cancer Paternal Grandfather 6  . Breast cancer Paternal Aunt   . Breast cancer Paternal Aunt   . Breast cancer Other 21   Her father  died last September 2017 at age 46 just 3 week shy of his 83rd birthday. Her mother is 96 years old as of October 2018. Patient has one brother and no sisters. Her mother was diagnosed with kidney cancer at age 32 with a nephrectomy following. Her maternal grandmother was diagnosed with colon cancer at age 70.  The patient paternal grandfather was diagnosed with colon cancer in his 35's. She has paternal cousins through her fathers half-sisters that have been diagnosed with breast cancer, she is unsure of the number of breast cancer occurrences due to the distance . A maternal cousin was diagnosed with breast cancer at age 21 and she had genetic testing. The patient does have a copy of this testing as well as her PCP.  This was not available for review on the nasal  GYNECOLOGIC HISTORY:  No LMP recorded. Patient has had an ablation.   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 Her husband Jennifer Beck is a Airline pilot. Her daughter [name] is 60 and currently a sophomore at [employer]. 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  The patient attends [employer]. She lives in [place].      ADVANCED DIRECTIVES:    HEALTH MAINTENANCE: Social History   Tobacco Use  . Smoking status: Never Smoker  . Smokeless tobacco: Never Used  Substance Use Topics  . Alcohol use: Yes    Comment: Occasionally drinks wine  . Drug use: No     Colonoscopy: Yes, 2003  PAP: March 2018   Bone density: N/A   Allergies  Allergen Reactions  . Codeine Itching  . Biaxin [Clarithromycin]     Heart Burn    Current Outpatient Medications  Medication Sig Dispense Refill  . acetaminophen (TYLENOL) 325 MG tablet Take 650 mg by mouth every 6 (six) hours as needed.    . Cholecalciferol (VITAMIN D) 2000 units CAPS Take 2,000 Units by mouth daily.    . Diphenhyd-Hydrocort-Nystatin (FIRST-DUKES MOUTHWASH) SUSP 5-10 ml qid prn swish swallow or spit 240 mL 3  . Evening Primrose Oil 1000 MG CAPS Take 1 capsule by mouth every evening.     . famotidine (PEPCID) 10 MG tablet Take 10 mg by mouth 2 (two) times daily.    . fluocinonide cream (LIDEX) 1.19 % Apply 1 application topically 2 (two) times daily as needed.     [name] glucosamine-chondroitin 500-400 MG tablet Take 1 tablet by mouth daily.     [name] ibuprofen (ADVIL,MOTRIN) 200 MG tablet Take 400 mg by mouth every 8 (eight) hours as needed for headache or mild pain.     . Melatonin 5 MG TABS Take 5 mg by mouth at bedtime as needed (sleep).     . Multiple Minerals-Vitamins (CALCIUM-MAGNESIUM-ZINC) TABS Take 1 tablet by mouth daily.     . Multiple Vitamins-Minerals  (MULTIVITAMINS THER. W/MINERALS) TABS Take 1 tablet by mouth daily.     . Omega-3 Fatty Acids (FISH OIL) 1000 MG CAPS Take 1,000 mg by mouth 2 (two) times a week.     [name] omeprazole (PRILOSEC) 40 MG capsule Take 1 capsule (40 mg total) by mouth 2 (two) times daily. (Patient taking differently: Take 40 mg  by mouth daily. ) 60 capsule 0  . ondansetron (ZOFRAN) 8 MG tablet Take 1 tablet (8 mg total) by mouth every 8 (eight) hours as needed for nausea or vomiting. 20 tablet 3  . Polyethyl Glycol-Propyl Glycol (SYSTANE OP) Apply 1 drop to eye 2 (two) times daily.    [name] VITAMIN E PO Take 1 tablet by mouth daily.     [name] ALPRAZolam (XANAX) 0.25 MG tablet Take 0.25 mg by mouth daily as needed for anxiety.     No current facility-administered medications for this visit.     OBJECTIVE: Middle-aged white woman in no acute distress  Vitals:   06/13/17 1338  BP: (!) 102/40  Pulse: (!) 107  Resp: 18  Temp: 97.9 F (36.6 C)  SpO2: 100%     Body mass index is 27.24 kg/m.   Wt Readings from Last 3 Encounters:  06/13/17 163 lb 11.2 oz (74.3 kg)  05/30/17 161 lb 14.4 oz (73.4 kg)  05/23/17 167 lb 8 oz (76 kg)      ECOG FS:1  Sclerae unicteric, pupils round and equal Oropharynx clear and moist No cervical or supraclavicular adenopathy Lungs no rales or rhonchi Heart regular rate and rhythm Abd soft, nontender, positive bowel sounds MSK no focal spinal tenderness, no upper extremity lymphedema Neuro: nonfocal, well oriented, appropriate affect Breasts: Deferred Skin: nails show remarkable Beau's lines  LAB RESULTS:  CMP     Component Value Date/Time   NA 139 06/13/2017 1320   NA 137 05/30/2017 1219   K 3.7 06/13/2017 1320   K 3.8 05/30/2017 1219   CL 106 06/13/2017 1320   CO2 25 06/13/2017 1320   CO2 25 05/30/2017 1219   GLUCOSE 114 06/13/2017 1320   GLUCOSE 122 05/30/2017 1219   BUN 9 06/13/2017 1320   BUN 8.9 05/30/2017 1219   CREATININE 0.69 06/13/2017 1320   CREATININE 0.7  05/30/2017 1219   CALCIUM 8.9 06/13/2017 1320   CALCIUM 9.4 05/30/2017 1219   PROT 6.6 06/13/2017 1320   PROT 6.4 05/30/2017 1219   ALBUMIN 3.8 06/13/2017 1320   ALBUMIN 3.7 05/30/2017 1219   AST 16 06/13/2017 1320   AST 12 05/30/2017 1219   ALT 19 06/13/2017 1320   ALT 14 05/30/2017 1219   ALKPHOS 72 06/13/2017 1320   ALKPHOS 137 05/30/2017 1219   BILITOT 0.3 06/13/2017 1320   BILITOT 0.38 05/30/2017 1219   GFRNONAA >60 06/13/2017 1320   GFRAA >60 06/13/2017 1320    No results found for: TOTALPROTELP, ALBUMINELP, A1GS, A2GS, BETS, BETA2SER, GAMS, MSPIKE, SPEI  No results found for: KPAFRELGTCHN, LAMBDASER, KAPLAMBRATIO  Lab Results  Component Value Date   WBC 4.3 06/13/2017   NEUTROABS 2.7 06/13/2017   HGB 10.8 (L) 06/13/2017   HCT 30.8 (L) 06/13/2017   MCV 96.2 06/13/2017   PLT 406 (H) 06/13/2017    '@LASTCHEMISTRY' @  No results found for: LABCA2  No components found for: EXNTZG017  No results for input(s): INR in the last 168 hours.  No results found for: LABCA2  No results found for: CBS496  No results found for: PRF163  No results found for: WGY659  No results found for: CA2729  No components found for: HGQUANT  No results found for: CEA1 / No results found for: CEA1   No results found for: AFPTUMOR  No results found for: CHROMOGRNA  No results found for: PSA1  Appointment on 06/13/2017  Component Date Value Ref Range Status  . Sodium 06/13/2017 139  136 -  145 mmol/L Final  . Potassium 06/13/2017 3.7  3.3 - 4.7 mmol/L Final  . Chloride 06/13/2017 106  98 - 109 mmol/L Final  . CO2 06/13/2017 25  22 - 29 mmol/L Final  . Glucose, Bld 06/13/2017 114  70 - 140 mg/dL Final  . BUN 06/13/2017 9  7 - 26 mg/dL Final  . Creatinine, Ser 06/13/2017 0.69  0.60 - 1.10 mg/dL Final  . Calcium 06/13/2017 8.9  8.4 - 10.4 mg/dL Final  . Total Protein 06/13/2017 6.6  6.4 - 8.3 g/dL Final  . Albumin 06/13/2017 3.8  3.5 - 5.0 g/dL Final  . AST 06/13/2017 16  5 -  34 U/L Final  . ALT 06/13/2017 19  0 - 55 U/L Final  . Alkaline Phosphatase 06/13/2017 72  40 - 150 U/L Final  . Total Bilirubin 06/13/2017 0.3  0.2 - 1.2 mg/dL Final  . GFR calc non Af Amer 06/13/2017 >60  >60 mL/min Final  . GFR calc Af Amer 06/13/2017 >60  >60 mL/min Final   Comment: (NOTE) The eGFR has been calculated using the CKD EPI equation. This calculation has not been validated in all clinical situations. eGFR's persistently <60 mL/min signify possible Chronic Kidney Disease.   Georgiann Hahn gap 06/13/2017 8  3 - 11 Final   Performed at [facility], [place] [address], [place], [place] [address]  . WBC 06/13/2017 4.3  3.9 - 10.3 K/uL Final  . RBC 06/13/2017 3.20* 3.70 - 5.45 MIL/uL Final  . Hemoglobin 06/13/2017 10.8* 11.6 - 15.9 g/dL Final  . HCT 06/13/2017 30.8* 34.8 - 46.6 % Final  . MCV 06/13/2017 96.2  79.5 - 101.0 fL Final  . MCH 06/13/2017 33.8  25.1 - 34.0 pg Final  . MCHC 06/13/2017 35.1  31.5 - 36.0 g/dL Final  . RDW 06/13/2017 14.4  11.2 - 16.1 % Final  . Platelets 06/13/2017 406* 145 - 400 K/uL Final  . Neutrophils Relative % 06/13/2017 63  % Final  . Neutro Abs 06/13/2017 2.7  1.5 - 6.5 K/uL Final  . Lymphocytes Relative 06/13/2017 22  % Final  . Lymphs Abs 06/13/2017 0.9  0.9 - 3.3 K/uL Final  . Monocytes Relative 06/13/2017 12  % Final  . Monocytes Absolute 06/13/2017 0.5  0.1 - 0.9 K/uL Final  . Eosinophils Relative 06/13/2017 2  % Final  . Eosinophils Absolute 06/13/2017 0.1  0.0 - 0.5 K/uL Final  . Basophils Relative 06/13/2017 1  % Final  . Basophils Absolute 06/13/2017 0.1  0.0 - 0.1 K/uL Final   Performed at [facility], [place] [address], [place], [place] [address]    (this displays the last labs from the last 3 days)  No results found for: TOTALPROTELP, ALBUMINELP, A1GS, A2GS, BETS, BETA2SER, GAMS, MSPIKE, SPEI (this displays SPEP labs)  No results found for: KPAFRELGTCHN, LAMBDASER,  KAPLAMBRATIO (kappa/lambda light chains)  No results found for: HGBA, HGBA2QUANT, HGBFQUANT, HGBSQUAN (Hemoglobinopathy evaluation)   No results found for: LDH  No results found for: IRON, TIBC, IRONPCTSAT (Iron and TIBC)  No results found for: FERRITIN  Urinalysis No results found for: COLORURINE, APPEARANCEUR, LABSPEC, PHURINE, GLUCOSEU, HGBUR, BILIRUBINUR, KETONESUR, PROTEINUR, UROBILINOGEN, NITRITE, LEUKOCYTESUR   STUDIES: No results found.  ELIGIBLE FOR AVAILABLE RESEARCH PROTOCOL:UPBEAT trial --patient refused  ASSESSMENT: 52 y.o. [place] woman status post right breast upper inner quadrant biopsy March 21, 2017 for a clinical T2 N0  Stage IIB invasive ductal carcinoma, grade 3, triple negative, with  an MIB-1 of 80%.  (a) biopsy of 2 additional suspicious areas in the right breast and one in the left breast scheduled for April 11, 2017  (1) neoadjuvant chemotherapy consisting of doxorubicin and cyclophosphamide in dose dense fashion x4 completed 05/23/2017, followed by paclitaxel/carboplatin weekly x12, first dose 06/06/2017  (2) definitive surgery to follow  (3) adjuvant radiation as appropriate  (4) genetics testing 04/22/2017 through the common hereditary cancer panel plus renal/urinary tract cancel panel showed no deleterious mutations in APC, ATM, AXIN2, BAP1, BARD1, BMPR1A, BRCA1, BRCA2, BRIP1, BUB1B, CDC73, CDH1, CDK4, CDKN1C, CDKN2A (p14ARF), CDKN2A (p16INK4a), CEP57, CHEK2, CTNNA1, DICER1, DIS3L2, EPCAM*, FH, FLCN, GPC3, GREM1*, KIT, MEN1, MET, MLH1,MSH2, MSH3, MSH6, MUTYH, NBN, NF1, PALB2, PDGFRA, PMS2, POLD1, POLE, PTEN, RAD50, RAD51C, RAD51D, SDHB,SDHC, SDHD, SMAD4, SMARCA4, SMARCB1, STK11, TP53, TSC1, TSC2, VHL, WT1.The following genes were evaluated for sequence changes only: HOXB13*, MITF*, NTHL1*, SDHA  (a) there was a Variant of Uncertain Significance identified in POLD1, namely c.2953C>T (p.Arg985Trp)  PLAN: Jennifer Beck is doing remarkably well with  the carboplatin/paclitaxel treatments.  The only nausea treatment she got was a single dose of Compazine and she had no problems with nausea, no mouth sores, and no significant fatigue--it is remarkable that she has continued to work full-time and has not missed a day of work except for surgical procedures.  Also she is having no peripheral neuropathy symptoms, which is very favorable.  I do not have a simple explanation for the slight swelling and slight erythema of her hands except to say that it is more likely to be due to her earlier chemo than to the current one.  If so then those symptoms should be resolving over the next 2 weeks.  I am not going to see her next week but she will be able to ask for me if there is an issue.  I can always pop in and see her in the treatment area if necessary.  I will see her again 2 weeks from now.  Hopefully we will be able to get through most or all of the 12 planned weekly treatments, with or without breaks, after which we will follow-up with a repeat MRI and definitive surgery  She knows to call for any other problems that may develop before her next visit.    [place] Dodgen, Virgie Dad, MD  06/13/17 2:11 PM Medical Oncology and Hematology [facility] [address] [place], [place] [address] Tel. [phone]    Fax. [phone]  This document serves as a record of services personally performed by Lurline Del, MD. It was created on his behalf by Sheron Nightingale, a trained medical scribe. The creation of this record is based on the scribe's personal observations and the provider's statements to them.   I have reviewed the above documentation for accuracy and completeness, and I agree with the above.

## 2017-06-13 ENCOUNTER — Telehealth: Payer: Self-pay | Admitting: Oncology

## 2017-06-13 ENCOUNTER — Encounter: Payer: Self-pay | Admitting: *Deleted

## 2017-06-13 ENCOUNTER — Inpatient Hospital Stay: Payer: 59

## 2017-06-13 ENCOUNTER — Inpatient Hospital Stay (HOSPITAL_BASED_OUTPATIENT_CLINIC_OR_DEPARTMENT_OTHER): Payer: 59 | Admitting: Oncology

## 2017-06-13 VITALS — BP 124/61 | HR 91 | Resp 17

## 2017-06-13 VITALS — BP 102/40 | HR 107 | Temp 97.9°F | Resp 18 | Ht 65.0 in | Wt 163.7 lb

## 2017-06-13 DIAGNOSIS — C50411 Malignant neoplasm of upper-outer quadrant of right female breast: Secondary | ICD-10-CM

## 2017-06-13 DIAGNOSIS — Z800 Family history of malignant neoplasm of digestive organs: Secondary | ICD-10-CM

## 2017-06-13 DIAGNOSIS — Z5111 Encounter for antineoplastic chemotherapy: Secondary | ICD-10-CM | POA: Diagnosis not present

## 2017-06-13 DIAGNOSIS — Z803 Family history of malignant neoplasm of breast: Secondary | ICD-10-CM | POA: Diagnosis not present

## 2017-06-13 DIAGNOSIS — Z8051 Family history of malignant neoplasm of kidney: Secondary | ICD-10-CM | POA: Diagnosis not present

## 2017-06-13 DIAGNOSIS — Z171 Estrogen receptor negative status [ER-]: Principal | ICD-10-CM

## 2017-06-13 DIAGNOSIS — R530 Neoplastic (malignant) related fatigue: Secondary | ICD-10-CM

## 2017-06-13 LAB — CBC WITH DIFFERENTIAL/PLATELET
BASOS ABS: 0.1 10*3/uL (ref 0.0–0.1)
BASOS PCT: 1 %
EOS ABS: 0.1 10*3/uL (ref 0.0–0.5)
Eosinophils Relative: 2 %
HCT: 30.8 % — ABNORMAL LOW (ref 34.8–46.6)
HEMOGLOBIN: 10.8 g/dL — AB (ref 11.6–15.9)
Lymphocytes Relative: 22 %
Lymphs Abs: 0.9 10*3/uL (ref 0.9–3.3)
MCH: 33.8 pg (ref 25.1–34.0)
MCHC: 35.1 g/dL (ref 31.5–36.0)
MCV: 96.2 fL (ref 79.5–101.0)
Monocytes Absolute: 0.5 10*3/uL (ref 0.1–0.9)
Monocytes Relative: 12 %
NEUTROS PCT: 63 %
Neutro Abs: 2.7 10*3/uL (ref 1.5–6.5)
PLATELETS: 406 10*3/uL — AB (ref 145–400)
RBC: 3.2 MIL/uL — AB (ref 3.70–5.45)
RDW: 14.4 % (ref 11.2–16.1)
WBC: 4.3 10*3/uL (ref 3.9–10.3)

## 2017-06-13 LAB — COMPREHENSIVE METABOLIC PANEL
ALK PHOS: 72 U/L (ref 40–150)
ALT: 19 U/L (ref 0–55)
AST: 16 U/L (ref 5–34)
Albumin: 3.8 g/dL (ref 3.5–5.0)
Anion gap: 8 (ref 3–11)
BILIRUBIN TOTAL: 0.3 mg/dL (ref 0.2–1.2)
BUN: 9 mg/dL (ref 7–26)
CALCIUM: 8.9 mg/dL (ref 8.4–10.4)
CHLORIDE: 106 mmol/L (ref 98–109)
CO2: 25 mmol/L (ref 22–29)
CREATININE: 0.69 mg/dL (ref 0.60–1.10)
Glucose, Bld: 114 mg/dL (ref 70–140)
Potassium: 3.7 mmol/L (ref 3.3–4.7)
Sodium: 139 mmol/L (ref 136–145)
TOTAL PROTEIN: 6.6 g/dL (ref 6.4–8.3)

## 2017-06-13 MED ORDER — SODIUM CHLORIDE 0.9 % IV SOLN
250.0000 mg | Freq: Once | INTRAVENOUS | Status: AC
  Administered 2017-06-13: 250 mg via INTRAVENOUS
  Filled 2017-06-13: qty 25

## 2017-06-13 MED ORDER — FAMOTIDINE IN NACL 20-0.9 MG/50ML-% IV SOLN
INTRAVENOUS | Status: AC
Start: 2017-06-13 — End: ?
  Filled 2017-06-13: qty 50

## 2017-06-13 MED ORDER — SODIUM CHLORIDE 0.9 % IV SOLN
Freq: Once | INTRAVENOUS | Status: AC
  Administered 2017-06-13: 15:00:00 via INTRAVENOUS

## 2017-06-13 MED ORDER — DIPHENHYDRAMINE HCL 50 MG/ML IJ SOLN
25.0000 mg | Freq: Once | INTRAMUSCULAR | Status: AC
  Administered 2017-06-13: 25 mg via INTRAVENOUS

## 2017-06-13 MED ORDER — SODIUM CHLORIDE 0.9 % IV SOLN
20.0000 mg | Freq: Once | INTRAVENOUS | Status: AC
  Administered 2017-06-13: 20 mg via INTRAVENOUS
  Filled 2017-06-13: qty 2

## 2017-06-13 MED ORDER — HEPARIN SOD (PORK) LOCK FLUSH 100 UNIT/ML IV SOLN
500.0000 [IU] | Freq: Once | INTRAVENOUS | Status: AC | PRN
  Administered 2017-06-13: 500 [IU]
  Filled 2017-06-13: qty 5

## 2017-06-13 MED ORDER — FAMOTIDINE IN NACL 20-0.9 MG/50ML-% IV SOLN
20.0000 mg | Freq: Once | INTRAVENOUS | Status: AC
  Administered 2017-06-13: 20 mg via INTRAVENOUS

## 2017-06-13 MED ORDER — SODIUM CHLORIDE 0.9% FLUSH
10.0000 mL | INTRAVENOUS | Status: DC | PRN
  Administered 2017-06-13: 10 mL
  Filled 2017-06-13: qty 10

## 2017-06-13 MED ORDER — PALONOSETRON HCL INJECTION 0.25 MG/5ML
0.2500 mg | Freq: Once | INTRAVENOUS | Status: AC
  Administered 2017-06-13: 0.25 mg via INTRAVENOUS

## 2017-06-13 MED ORDER — SODIUM CHLORIDE 0.9 % IJ SOLN
10.0000 mL | Freq: Once | INTRAMUSCULAR | Status: AC
  Administered 2017-06-13: 10 mL via INTRAVENOUS
  Filled 2017-06-13: qty 10

## 2017-06-13 MED ORDER — SODIUM CHLORIDE 0.9 % IV SOLN
80.0000 mg/m2 | Freq: Once | INTRAVENOUS | Status: AC
  Administered 2017-06-13: 150 mg via INTRAVENOUS
  Filled 2017-06-13: qty 25

## 2017-06-13 MED ORDER — PALONOSETRON HCL INJECTION 0.25 MG/5ML
INTRAVENOUS | Status: AC
  Filled 2017-06-13: qty 5

## 2017-06-13 MED ORDER — DIPHENHYDRAMINE HCL 50 MG/ML IJ SOLN
INTRAMUSCULAR | Status: AC
  Filled 2017-06-13: qty 1

## 2017-06-13 NOTE — Patient Instructions (Signed)

## 2017-06-13 NOTE — Patient Instructions (Addendum)
[facility] Discharge Instructions for Patients Receiving Chemotherapy  Today you received the following chemotherapy agents: Paclitaxel (Taxol) and Carboplatin  To help prevent nausea and vomiting after your treatment, we encourage you to take your nausea medication  as prescribed.    If you develop nausea and vomiting that is not controlled by your nausea medication, call the clinic.   BELOW ARE SYMPTOMS THAT SHOULD BE REPORTED IMMEDIATELY:  *FEVER GREATER THAN 100.5 F  *CHILLS WITH OR WITHOUT FEVER  NAUSEA AND VOMITING THAT IS NOT CONTROLLED WITH YOUR NAUSEA MEDICATION  *UNUSUAL SHORTNESS OF BREATH  *UNUSUAL BRUISING OR BLEEDING  TENDERNESS IN MOUTH AND THROAT WITH OR WITHOUT PRESENCE OF ULCERS  *URINARY PROBLEMS  *BOWEL PROBLEMS  UNUSUAL RASH Items with * indicate a potential emergency and should be followed up as soon as possible.  Feel free to call the clinic should you have any questions or concerns. The clinic phone number is [phone].  Please show the CHEMO ALERT CARD at check-in to the Emergency Department and triage nurse.   

## 2017-06-13 NOTE — Telephone Encounter (Signed)
[place] PAL - moved 2/14 f/u to [facility] per LC due to both LC and GM out that afternoon. Left message for patient re change. Other appointments remain the same.

## 2017-06-20 ENCOUNTER — Inpatient Hospital Stay: Payer: 59

## 2017-06-20 VITALS — BP 126/67 | HR 92 | Temp 98.4°F | Resp 16

## 2017-06-20 DIAGNOSIS — C50411 Malignant neoplasm of upper-outer quadrant of right female breast: Secondary | ICD-10-CM

## 2017-06-20 DIAGNOSIS — Z171 Estrogen receptor negative status [ER-]: Principal | ICD-10-CM

## 2017-06-20 DIAGNOSIS — Z5111 Encounter for antineoplastic chemotherapy: Secondary | ICD-10-CM | POA: Diagnosis not present

## 2017-06-20 LAB — COMPREHENSIVE METABOLIC PANEL
ALBUMIN: 3.7 g/dL (ref 3.5–5.0)
ALK PHOS: 63 U/L (ref 40–150)
ALT: 23 U/L (ref 0–55)
AST: 18 U/L (ref 5–34)
Anion gap: 10 (ref 3–11)
BUN: 8 mg/dL (ref 7–26)
CALCIUM: 9.1 mg/dL (ref 8.4–10.4)
CO2: 26 mmol/L (ref 22–29)
CREATININE: 0.73 mg/dL (ref 0.60–1.10)
Chloride: 105 mmol/L (ref 98–109)
GFR calc non Af Amer: >60 mL/min (ref >60)
GLUCOSE: 124 mg/dL (ref 70–140)
Potassium: 3.9 mmol/L (ref 3.3–4.7)
Sodium: 141 mmol/L (ref 136–145)
Total Bilirubin: 0.3 mg/dL (ref 0.2–1.2)
Total Protein: 6.3 g/dL — ABNORMAL LOW (ref 6.4–8.3)

## 2017-06-20 LAB — CBC WITH DIFFERENTIAL/PLATELET
Basophils Absolute: 0 10*3/uL (ref 0.0–0.1)
Basophils Relative: 1 %
Eosinophils Absolute: 0.3 10*3/uL (ref 0.0–0.5)
Eosinophils Relative: 8 %
HCT: 29.8 % — ABNORMAL LOW (ref 34.8–46.6)
HEMOGLOBIN: 10.4 g/dL — AB (ref 11.6–15.9)
LYMPHS ABS: 0.8 10*3/uL — AB (ref 0.9–3.3)
Lymphocytes Relative: 20 %
MCH: 33.8 pg (ref 25.1–34.0)
MCHC: 34.9 g/dL (ref 31.5–36.0)
MCV: 96.8 fL (ref 79.5–101.0)
Monocytes Absolute: 0.6 10*3/uL (ref 0.1–0.9)
Monocytes Relative: 15 %
NEUTROS PCT: 56 %
Neutro Abs: 2.2 10*3/uL (ref 1.5–6.5)
Platelets: 313 10*3/uL (ref 145–400)
RBC: 3.08 MIL/uL — AB (ref 3.70–5.45)
RDW: 14.8 % (ref 11.2–16.1)
WBC: 3.9 10*3/uL (ref 3.9–10.3)

## 2017-06-20 MED ORDER — PALONOSETRON HCL INJECTION 0.25 MG/5ML
INTRAVENOUS | Status: AC
  Filled 2017-06-20: qty 5

## 2017-06-20 MED ORDER — DIPHENHYDRAMINE HCL 50 MG/ML IJ SOLN
INTRAMUSCULAR | Status: AC
  Filled 2017-06-20: qty 1

## 2017-06-20 MED ORDER — SODIUM CHLORIDE 0.9 % IV SOLN
80.0000 mg/m2 | Freq: Once | INTRAVENOUS | Status: AC
  Administered 2017-06-20: 150 mg via INTRAVENOUS
  Filled 2017-06-20: qty 25

## 2017-06-20 MED ORDER — FAMOTIDINE IN NACL 20-0.9 MG/50ML-% IV SOLN
20.0000 mg | Freq: Once | INTRAVENOUS | Status: AC
  Administered 2017-06-20: 20 mg via INTRAVENOUS

## 2017-06-20 MED ORDER — PALONOSETRON HCL INJECTION 0.25 MG/5ML
0.2500 mg | Freq: Once | INTRAVENOUS | Status: AC
  Administered 2017-06-20: 0.25 mg via INTRAVENOUS

## 2017-06-20 MED ORDER — FAMOTIDINE IN NACL 20-0.9 MG/50ML-% IV SOLN
INTRAVENOUS | Status: AC
Start: 2017-06-20 — End: ?
  Filled 2017-06-20: qty 50

## 2017-06-20 MED ORDER — SODIUM CHLORIDE 0.9 % IV SOLN
20.0000 mg | Freq: Once | INTRAVENOUS | Status: AC
  Administered 2017-06-20: 20 mg via INTRAVENOUS
  Filled 2017-06-20: qty 2

## 2017-06-20 MED ORDER — HEPARIN SOD (PORK) LOCK FLUSH 100 UNIT/ML IV SOLN
500.0000 [IU] | Freq: Once | INTRAVENOUS | Status: AC | PRN
  Administered 2017-06-20: 500 [IU]
  Filled 2017-06-20: qty 5

## 2017-06-20 MED ORDER — SODIUM CHLORIDE 0.9 % IV SOLN
249.2000 mg | Freq: Once | INTRAVENOUS | Status: AC
  Administered 2017-06-20: 250 mg via INTRAVENOUS
  Filled 2017-06-20: qty 25

## 2017-06-20 MED ORDER — SODIUM CHLORIDE 0.9% FLUSH
10.0000 mL | Freq: Once | INTRAVENOUS | Status: AC
  Administered 2017-06-20: 10 mL
  Filled 2017-06-20: qty 10

## 2017-06-20 MED ORDER — SODIUM CHLORIDE 0.9 % IV SOLN
Freq: Once | INTRAVENOUS | Status: AC
  Administered 2017-06-20: 15:00:00 via INTRAVENOUS

## 2017-06-20 MED ORDER — DIPHENHYDRAMINE HCL 50 MG/ML IJ SOLN
25.0000 mg | Freq: Once | INTRAMUSCULAR | Status: AC
  Administered 2017-06-20: 25 mg via INTRAVENOUS

## 2017-06-20 MED ORDER — SODIUM CHLORIDE 0.9% FLUSH
10.0000 mL | INTRAVENOUS | Status: DC | PRN
  Administered 2017-06-20: 10 mL
  Filled 2017-06-20: qty 10

## 2017-06-20 NOTE — Patient Instructions (Signed)
[facility] Discharge Instructions for Patients Receiving Chemotherapy  Today you received the following chemotherapy agents: Paclitaxel (Taxol) and Carboplatin  To help prevent nausea and vomiting after your treatment, we encourage you to take your nausea medication  as prescribed.    If you develop nausea and vomiting that is not controlled by your nausea medication, call the clinic.   BELOW ARE SYMPTOMS THAT SHOULD BE REPORTED IMMEDIATELY:  *FEVER GREATER THAN 100.5 F  *CHILLS WITH OR WITHOUT FEVER  NAUSEA AND VOMITING THAT IS NOT CONTROLLED WITH YOUR NAUSEA MEDICATION  *UNUSUAL SHORTNESS OF BREATH  *UNUSUAL BRUISING OR BLEEDING  TENDERNESS IN MOUTH AND THROAT WITH OR WITHOUT PRESENCE OF ULCERS  *URINARY PROBLEMS  *BOWEL PROBLEMS  UNUSUAL RASH Items with * indicate a potential emergency and should be followed up as soon as possible.  Feel free to call the clinic should you have any questions or concerns. The clinic phone number is [phone].  Please show the CHEMO ALERT CARD at check-in to the Emergency Department and triage nurse.   

## 2017-06-26 NOTE — Progress Notes (Signed)
[place]  [phone] [phone]     ID: Jennifer Beck DOB: 02-Nov-1972  MR#: [id number]  FUX#:[id number]  Patient Care Team: [name], MD as PCP - General (Internal Medicine) [name], MD as Consulting Physician (Dermatology) [name], DC as Referring Physician (Chiropractic Medicine) [name], MD as Consulting Physician (General Surgery) [name], MD as Attending Physician (Radiation Oncology) [name], MD as Consulting Physician (Obstetrics and Gynecology) OTHER MD:  CHIEF COMPLAINT: Triple negative breast cancer  CURRENT TREATMENT: Neoadjuvant chemotherapy  INTERVAL HISTORY: Maleaha returns today for follow-up on treatment of her estrogen receptor negative breast cancer.  She is receiving carboplatin and paclitaxel weekly, and today is the fourth of 12 planned doses, to be followed by definitive surgery.    REVIEW OF SYSTEMS:  Nimrit has been doing well overall. However, she has been experiencing some nosebleeds. She thinks it is due to the dry air. She reports that when she blows her nose she notices some blood. Only occasionally does her nose drip blood, which is mainly during the night. Her hands are dry, but they haven't been feeling tingling. During treatment she will soak her hands in cold ice water. She has been taking stool softener to help with constipation. The constipation is the worst right after treatment. Her sense of taste has not changed. She is starting a weekly yoga class. She denies unusual headaches, nausea, vomiting, or dizziness. There has been no unusual cough, phlegm production, or pleurisy. This been no change in bladder habits. She denies unexplained fatigue or unexplained weight loss, rash, or fever. A detailed review of systems was otherwise noncontributory.    HISTORY OF CURRENT ILLNESS:  From the original intake note:  [name] palpated a mass in her right breast  sometime in September 2018.[name] She brought this to medical attention and her PCP, [name], set her up on 03/18/2017 for a unilateral right diagnostic mammography with ultrasonography, showing: Breast density D. The palpable right breast mass at 12:30 was indeterminate, and biopsy was warranted at that time. No evidence of right axillary lymphadenopathy was noted. Biopsy of the lesion in question on 03/21/2017 at the right breast 12:30 position showed ([id number])  invasive ductal carcinoma with lymphovascular invasion.  The tumor was grade 3, triple negative, with an MIB-1 of 80%.  She is accompanied by her husband, [name] to the office today. She reports that she is doing well overall. She initially felt a lump in September in the shower and notes that she doesn't complete monthly self breast exams. She had a routine mammogram in March 2018 at [facility] and she is followed by Dr. [name].  As far as surgeries, she has had two laparoscopic procedures for endometriosis as well as a bilateral tubal ligation. She still has occasional cramping, painful ovulation, and vaginal spotting that comes and goes. She has had cyst to her breast before that have been evaluated and ruled as negative. She notes that she still has her tonsils, gallbladder, and appendix. She has a prior hx of pericarditis in 2003 that she notes was brought onby a virus. She has since been cleared by her prior Cardiologist.   She has a Restaurant manager, fast food, [name] that she sees every 5 weeks for maintenance of her neck and back issues. She has a dermatologist, [name] at [facility] and she has had an biopsy of an area from her left chest that resulted as basal cell carcinoma in  2015. She denies having a GI specialist, Cardiologist, or Orthopedist at this time. She denies prior history of seizures, migraines, acid reflux, asthma, emphysema, palpitations or GI issues.    The patient's subsequent  history is as detailed below.    PAST MEDICAL HISTORY: Past Medical History:  Diagnosis Date  . Chest pain   . Complication of anesthesia   . Endometriosis   . Family history of breast cancer 04/22/2017  . Family history of colon cancer 04/22/2017  . Family history of kidney cancer 04/22/2017  . Fatigue   . GERD (gastroesophageal reflux disease)   . GI bleed    caused by ischemic colitis following an episode of hypertension  . Heart palpitations   . Hx of echocardiogram    a. echo 9/13:  EF 55-60%, Gr 2 diast dysfn  . Hypercholesterolemia   . Ischemic colitis ([place])   . 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 Myocarditis ([place])   . Pericarditis   . PONV (postoperative nausea and vomiting)   . SOB (shortness of breath)     PAST SURGICAL HISTORY: Past Surgical History:  Procedure Laterality Date  . CARDIAC CATHETERIZATION  08/26/2001   EF of 60% --- smooth & normal coronary arteries -- there is a Lame motion defect consistent with posterolateral MI -- she quite possibly had a coronary spasm -- she may have some pericarditis secondary to her MI   . DILATION AND CURETTAGE OF UTERUS  10/15/2003   Uterine enlargement, menorrhagia  . DILATION AND CURETTAGE OF UTERUS  05/27/2002   Dysfunctional uterine bleeding  . LAPAROSCOPY    . NOVASURE ABLATION  10/15/2003   for management of her extreme menorrhagi  . PORTACATH PLACEMENT Right 04/02/2017   Procedure: INSERTION PORT-A-CATH;  Surgeon: [name], MD;  Location: [facility];  Service: General;  Laterality: Right;  . TUBAL LIGATION  09/2000   bilateral    FAMILY HISTORY Family History  Problem Relation Age of Onset  . Hypertension Father   . Diabetes Father   . Other Father        stomach mass suspicious for ca, never bx  . Coronary artery disease Mother   . Kidney cancer Mother 47  . Coronary artery disease Maternal Grandfather   . Colon cancer Maternal Grandfather  61       recurrence  . Coronary artery disease Maternal Uncle   . Coronary artery disease Maternal Uncle   . Diabetes Maternal Uncle   . Colon cancer Maternal Grandmother 7  . Breast cancer Cousin 52       had GT- CHEK2+  . Cervical cancer Maternal Aunt 35  . Diabetes Maternal Aunt   . Alzheimer's disease Paternal Grandmother   . Colon cancer Paternal Grandfather 61  . Breast cancer Paternal Aunt   . Breast cancer Paternal Aunt   . Breast cancer Other 82   Her father died last 02-11-16 at age 25 just 45 week shy of his 83rd birthday. Her mother is 26 years old as of October 2018. Patient has one brother and no sisters. Her mother was diagnosed with kidney cancer at age 53 with a nephrectomy following. Her maternal grandmother was diagnosed with colon cancer at age 59.  The patient paternal grandfather was diagnosed with colon cancer in his 8's. She has paternal cousins through her fathers half-sisters that have been diagnosed with breast cancer, she is unsure of the number of breast  cancer occurrences due to the distance . A maternal cousin was diagnosed with breast cancer at age 24 and she had genetic testing. The patient does have a copy of this testing as well as her PCP.  This was not available for review on the nasal  GYNECOLOGIC HISTORY:  No LMP recorded. Patient has had an ablation.   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 Her husband [name] is a Airline pilot. Her daughter [name] is 68 and currently a sophomore at [employer]. 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  The patient attends [employer]. She lives in [place].      ADVANCED DIRECTIVES:    HEALTH  MAINTENANCE: Social History   Tobacco Use  . Smoking status: Never Smoker  . Smokeless tobacco: Never Used  Substance Use Topics  . Alcohol use: Yes    Comment: Occasionally drinks wine  . Drug use: No     Colonoscopy: Yes, 2003  PAP: March 2018   Bone density: N/A   Allergies  Allergen Reactions  . Codeine Itching  . Biaxin [Clarithromycin]     Heart Burn    Current Outpatient Medications  Medication Sig Dispense Refill  . acetaminophen (TYLENOL) 325 MG tablet Take 650 mg by mouth every 6 (six) hours as needed.    . ALPRAZolam (XANAX) 0.25 MG tablet Take 0.25 mg by mouth daily as needed for anxiety.    . Cholecalciferol (VITAMIN D) 2000 units CAPS Take 2,000 Units by mouth daily.    . Diphenhyd-Hydrocort-Nystatin (FIRST-DUKES MOUTHWASH) SUSP 5-10 ml qid prn swish swallow or spit 240 mL 3  . Evening Primrose Oil 1000 MG CAPS Take 1 capsule by mouth every evening.     . famotidine (PEPCID) 10 MG tablet Take 10 mg by mouth 2 (two) times daily.    . fluocinonide cream (LIDEX) 8.09 % Apply 1 application topically 2 (two) times daily as needed.     [name] glucosamine-chondroitin 500-400 MG tablet Take 1 tablet by mouth daily.     [name] ibuprofen (ADVIL,MOTRIN) 200 MG tablet Take 400 mg by mouth every 8 (eight) hours as needed for headache or mild pain.     . Melatonin 5 MG TABS Take 5 mg by mouth at bedtime as needed (sleep).     . Multiple Minerals-Vitamins (CALCIUM-MAGNESIUM-ZINC) TABS Take 1 tablet by mouth daily.     . Multiple Vitamins-Minerals (MULTIVITAMINS THER. W/MINERALS) TABS Take 1 tablet by mouth daily.     . Omega-3 Fatty Acids (FISH OIL) 1000 MG CAPS Take 1,000 mg by mouth 2 (two) times a week.     [name] omeprazole (PRILOSEC) 40 MG capsule Take 1 capsule (40 mg total) by mouth 2 (two) times daily. (Patient taking differently: Take 40 mg by mouth daily. ) 60 capsule 0  . ondansetron (ZOFRAN) 8 MG tablet Take 1 tablet (8 mg total) by mouth every 8 (eight) hours as needed for nausea  or vomiting. 20 tablet 3  . Polyethyl Glycol-Propyl Glycol (SYSTANE OP) Apply 1 drop to eye 2 (two) times daily.    [name] VITAMIN E PO Take 1 tablet by mouth daily.      No current facility-administered medications for  this visit.     OBJECTIVE: Middle-aged white woman who appears well  Vitals:   06/27/17 1400  BP: 127/64  Pulse: (!) 104  Resp: 18  Temp: 98.4 F (36.9 C)  SpO2: 100%     Body mass index is 27.12 kg/m.   Wt Readings from Last 3 Encounters:  06/27/17 163 lb (73.9 kg)  06/13/17 163 lb 11.2 oz (74.3 kg)  05/30/17 161 lb 14.4 oz (73.4 kg)      ECOG FS:0  Sclerae unicteric, EOMs intact Oropharynx clear and moist No cervical or supraclavicular adenopathy Lungs no rales or rhonchi Heart regular rate and rhythm Abd soft, nontender, positive bowel sounds MSK no focal spinal tenderness, no upper extremity lymphedema Neuro: nonfocal, well oriented, appropriate affect Breasts: I do not palpate a well-defined mass in the right breast.  The left breast is unremarkable.  Both axillae are benign.   Skin: nails show remarkable Beau's lines (06/27/2017)    LAB RESULTS:  CMP     Component Value Date/Time   NA 141 06/20/2017 1315   NA 137 05/30/2017 1219   K 3.9 06/20/2017 1315   K 3.8 05/30/2017 1219   CL 105 06/20/2017 1315   CO2 26 06/20/2017 1315   CO2 25 05/30/2017 1219   GLUCOSE 124 06/20/2017 1315   GLUCOSE 122 05/30/2017 1219   BUN 8 06/20/2017 1315   BUN 8.9 05/30/2017 1219   CREATININE 0.73 06/20/2017 1315   CREATININE 0.7 05/30/2017 1219   CALCIUM 9.1 06/20/2017 1315   CALCIUM 9.4 05/30/2017 1219   PROT 6.3 (L) 06/20/2017 1315   PROT 6.4 05/30/2017 1219   ALBUMIN 3.7 06/20/2017 1315   ALBUMIN 3.7 05/30/2017 1219   AST 18 06/20/2017 1315   AST 12 05/30/2017 1219   ALT 23 06/20/2017 1315   ALT 14 05/30/2017 1219   ALKPHOS 63 06/20/2017 1315   ALKPHOS 137 05/30/2017 1219   BILITOT 0.3 06/20/2017 1315   BILITOT 0.38 05/30/2017 1219   GFRNONAA  >60 06/20/2017 1315   GFRAA >60 06/20/2017 1315    No results found for: TOTALPROTELP, ALBUMINELP, A1GS, A2GS, BETS, BETA2SER, GAMS, MSPIKE, SPEI  No results found for: KPAFRELGTCHN, LAMBDASER, KAPLAMBRATIO  Lab Results  Component Value Date   WBC 3.6 (L) 06/27/2017   NEUTROABS 2.0 06/27/2017   HGB 10.3 (L) 06/27/2017   HCT 29.6 (L) 06/27/2017   MCV 97.4 06/27/2017   PLT 257 06/27/2017    '@LASTCHEMISTRY' @  No results found for: LABCA2  No components found for: IFOYDX412  No results for input(s): INR in the last 168 hours.  No results found for: LABCA2  No results found for: INO676  No results found for: HMC947  No results found for: SJG283  No results found for: CA2729  No components found for: HGQUANT  No results found for: CEA1 / No results found for: CEA1   No results found for: AFPTUMOR  No results found for: CHROMOGRNA  No results found for: PSA1  Appointment on 06/27/2017  Component Date Value Ref Range Status  . WBC 06/27/2017 3.6* 3.9 - 10.3 K/uL Final  . RBC 06/27/2017 3.04* 3.70 - 5.45 MIL/uL Final  . Hemoglobin 06/27/2017 10.3* 11.6 - 15.9 g/dL Final  . HCT 06/27/2017 29.6* 34.8 - 46.6 % Final  . MCV 06/27/2017 97.4  79.5 - 101.0 fL Final  . MCH 06/27/2017 33.9  25.1 - 34.0 pg Final  . MCHC 06/27/2017 34.8  31.5 - 36.0 g/dL Final  . RDW 06/27/2017 14.8* 11.2 -  14.5 % Final  . Platelets 06/27/2017 257  145 - 400 K/uL Final  . Neutrophils Relative % 06/27/2017 55  % Final  . Neutro Abs 06/27/2017 2.0  1.5 - 6.5 K/uL Final  . Lymphocytes Relative 06/27/2017 25  % Final  . Lymphs Abs 06/27/2017 0.9  0.9 - 3.3 K/uL Final  . Monocytes Relative 06/27/2017 7  % Final  . Monocytes Absolute 06/27/2017 0.3  0.1 - 0.9 K/uL Final  . Eosinophils Relative 06/27/2017 12  % Final  . Eosinophils Absolute 06/27/2017 0.4  0.0 - 0.5 K/uL Final  . Basophils Relative 06/27/2017 1  % Final  . Basophils Absolute 06/27/2017 0.0  0.0 - 0.1 K/uL Final   Performed at  [facility], [place] [address], [place], [place] [address]    (this displays the last labs from the last 3 days)  No results found for: TOTALPROTELP, ALBUMINELP, A1GS, A2GS, BETS, BETA2SER, GAMS, MSPIKE, SPEI (this displays SPEP labs)  No results found for: KPAFRELGTCHN, LAMBDASER, KAPLAMBRATIO (kappa/lambda light chains)  No results found for: HGBA, HGBA2QUANT, HGBFQUANT, HGBSQUAN (Hemoglobinopathy evaluation)   No results found for: LDH  No results found for: IRON, TIBC, IRONPCTSAT (Iron and TIBC)  No results found for: FERRITIN  Urinalysis No results found for: COLORURINE, APPEARANCEUR, LABSPEC, PHURINE, GLUCOSEU, HGBUR, BILIRUBINUR, KETONESUR, PROTEINUR, UROBILINOGEN, NITRITE, LEUKOCYTESUR   STUDIES: No results found.  ELIGIBLE FOR AVAILABLE RESEARCH PROTOCOL:UPBEAT trial --patient refused  ASSESSMENT: 52 y.o. [place] woman status post right breast upper inner quadrant biopsy March 21, 2017 for a clinical T2 N0  Stage IIB invasive ductal carcinoma, grade 3, triple negative, with an MIB-1 of 80%.  (a) biopsy of 2 additional suspicious areas in the right breast and one in the left breast scheduled for April 11, 2017  (1) neoadjuvant chemotherapy consisting of doxorubicin and cyclophosphamide in dose dense fashion x4 completed 05/23/2017, followed by paclitaxel/carboplatin weekly x12, first dose 06/06/2017  (2) definitive surgery to follow  (3) adjuvant radiation as appropriate  (4) genetics testing 04/22/2017 through the common hereditary cancer panel plus renal/urinary tract cancel panel showed no deleterious mutations in APC, ATM, AXIN2, BAP1, BARD1, BMPR1A, BRCA1, BRCA2, BRIP1, BUB1B, CDC73, CDH1, CDK4, CDKN1C, CDKN2A (p14ARF), CDKN2A (p16INK4a), CEP57, CHEK2, CTNNA1, DICER1, DIS3L2, EPCAM*, FH, FLCN, GPC3, GREM1*, KIT, MEN1, MET, MLH1,MSH2, MSH3, MSH6, MUTYH, NBN, NF1, PALB2, PDGFRA, PMS2, POLD1, POLE, PTEN, RAD50, RAD51C, RAD51D,  SDHB,SDHC, SDHD, SMAD4, SMARCA4, SMARCB1, STK11, TP53, TSC1, TSC2, VHL, WT1.The following genes were evaluated for sequence changes only: HOXB13*, MITF*, NTHL1*, SDHA  (a) there was a Variant of Uncertain Significance identified in POLD1, namely c.2953C>T (p.[id number])  PLAN: Aamari is tolerating the Taxol remarkably well and will proceed to her fourth of 12 planned doses today.  We again discussed neuropathy issues.  She is bringing a little bowl in which she keeps her hands in cold water through most of the infusion.  This actually has been shown to help prevent neuropathy and we are going to be trying to implement this in our patients receiving Taxol.  She is just the first to actually do it (at her own initiative!).   I have urged her to let us know of any changes in her finger or toe pad sensation  She wonders if she will be able to go back on hormones.  The simple answer is no.  Of course if she does go on tamoxifen she can use vaginal estrogens.  If she did go on hormones however I do not  think that would improve her libido, which is a real concern.  What I think would help that is to get away from stress and that means work, children, and cancer, and does go off with her husband for 5 days and just enjoy herself somewhere, a year after she completes her treatments.  This is a very common complaints and I am afraid we do not have a simple answer for it  She is interested in having a repeat echocardiogram.  If she does wish to do that my recommendation is to do it after all her treatments are completed including surgery and radiation  She is seeing [place] every 2 weeks.  She will see my partner Dr. Payton Mccallum in 2 weeks since I will be giving a talk of the genetics program at [place] that afternoon.  She will see me again 2 weeks after that.  She knows to call for any other issues that may develop before her next visit.    Wyland Rastetter, Virgie Dad, MD  06/27/17 2:22 PM Medical Oncology and  Hematology [facility] [address] [place], [place] [address] Tel. [phone]    Fax. [phone]  This document serves as a record of services personally performed by Chauncey Cruel, MD. It was created on his behalf by Margit Banda, a trained medical scribe. The creation of this record is based on the scribe's personal observations and the provider's statements to them.   I have reviewed the above documentation for accuracy and completeness, and I agree with the above.

## 2017-06-27 ENCOUNTER — Inpatient Hospital Stay: Payer: 59

## 2017-06-27 ENCOUNTER — Inpatient Hospital Stay (HOSPITAL_BASED_OUTPATIENT_CLINIC_OR_DEPARTMENT_OTHER): Payer: 59 | Admitting: Oncology

## 2017-06-27 VITALS — BP 127/64 | HR 104 | Temp 98.4°F | Resp 18 | Ht 65.0 in | Wt 163.0 lb

## 2017-06-27 DIAGNOSIS — R530 Neoplastic (malignant) related fatigue: Secondary | ICD-10-CM

## 2017-06-27 DIAGNOSIS — Z171 Estrogen receptor negative status [ER-]: Principal | ICD-10-CM

## 2017-06-27 DIAGNOSIS — C50411 Malignant neoplasm of upper-outer quadrant of right female breast: Secondary | ICD-10-CM

## 2017-06-27 DIAGNOSIS — Z302 Encounter for sterilization: Secondary | ICD-10-CM | POA: Diagnosis not present

## 2017-06-27 DIAGNOSIS — Z85828 Personal history of other malignant neoplasm of skin: Secondary | ICD-10-CM | POA: Diagnosis not present

## 2017-06-27 DIAGNOSIS — Z800 Family history of malignant neoplasm of digestive organs: Secondary | ICD-10-CM

## 2017-06-27 DIAGNOSIS — Z79899 Other long term (current) drug therapy: Secondary | ICD-10-CM

## 2017-06-27 DIAGNOSIS — Z803 Family history of malignant neoplasm of breast: Secondary | ICD-10-CM | POA: Diagnosis not present

## 2017-06-27 DIAGNOSIS — Z8051 Family history of malignant neoplasm of kidney: Secondary | ICD-10-CM | POA: Diagnosis not present

## 2017-06-27 DIAGNOSIS — N809 Endometriosis, unspecified: Secondary | ICD-10-CM

## 2017-06-27 DIAGNOSIS — Z5111 Encounter for antineoplastic chemotherapy: Secondary | ICD-10-CM | POA: Diagnosis not present

## 2017-06-27 LAB — COMPREHENSIVE METABOLIC PANEL
ALBUMIN: 3.8 g/dL (ref 3.5–5.0)
ALT: 24 U/L (ref 0–55)
AST: 18 U/L (ref 5–34)
Alkaline Phosphatase: 65 U/L (ref 40–150)
Anion gap: 9 (ref 3–11)
BUN: 11 mg/dL (ref 7–26)
CHLORIDE: 106 mmol/L (ref 98–109)
CO2: 25 mmol/L (ref 22–29)
CREATININE: 0.66 mg/dL (ref 0.60–1.10)
Calcium: 9 mg/dL (ref 8.4–10.4)
GFR calc Af Amer: >60 mL/min (ref >60)
GFR calc non Af Amer: >60 mL/min (ref >60)
GLUCOSE: 127 mg/dL (ref 70–140)
POTASSIUM: 3.7 mmol/L (ref 3.5–5.1)
SODIUM: 140 mmol/L (ref 136–145)
Total Bilirubin: 0.3 mg/dL (ref 0.2–1.2)
Total Protein: 6.4 g/dL (ref 6.4–8.3)

## 2017-06-27 LAB — CBC WITH DIFFERENTIAL/PLATELET
BASOS ABS: 0 10*3/uL (ref 0.0–0.1)
BASOS PCT: 1 %
EOS ABS: 0.4 10*3/uL (ref 0.0–0.5)
EOS PCT: 12 %
HCT: 29.6 % — ABNORMAL LOW (ref 34.8–46.6)
Hemoglobin: 10.3 g/dL — ABNORMAL LOW (ref 11.6–15.9)
LYMPHS PCT: 25 %
Lymphs Abs: 0.9 10*3/uL (ref 0.9–3.3)
MCH: 33.9 pg (ref 25.1–34.0)
MCHC: 34.8 g/dL (ref 31.5–36.0)
MCV: 97.4 fL (ref 79.5–101.0)
Monocytes Absolute: 0.3 10*3/uL (ref 0.1–0.9)
Monocytes Relative: 7 %
Neutro Abs: 2 10*3/uL (ref 1.5–6.5)
Neutrophils Relative %: 55 %
PLATELETS: 257 10*3/uL (ref 145–400)
RBC: 3.04 MIL/uL — AB (ref 3.70–5.45)
RDW: 14.8 % — AB (ref 11.2–14.5)
WBC: 3.6 10*3/uL — AB (ref 3.9–10.3)

## 2017-06-27 MED ORDER — DIPHENHYDRAMINE HCL 50 MG/ML IJ SOLN
INTRAMUSCULAR | Status: AC
  Filled 2017-06-27: qty 1

## 2017-06-27 MED ORDER — DEXAMETHASONE SODIUM PHOSPHATE 10 MG/ML IJ SOLN
INTRAMUSCULAR | Status: AC
  Filled 2017-06-27: qty 1

## 2017-06-27 MED ORDER — DEXAMETHASONE SODIUM PHOSPHATE 10 MG/ML IJ SOLN
10.0000 mg | Freq: Once | INTRAMUSCULAR | Status: AC
Start: 2017-06-27 — End: 2017-06-27
  Administered 2017-06-27: 10 mg via INTRAVENOUS

## 2017-06-27 MED ORDER — HEPARIN SOD (PORK) LOCK FLUSH 100 UNIT/ML IV SOLN
500.0000 [IU] | Freq: Once | INTRAVENOUS | Status: AC | PRN
  Administered 2017-06-27: 500 [IU]
  Filled 2017-06-27: qty 5

## 2017-06-27 MED ORDER — PALONOSETRON HCL INJECTION 0.25 MG/5ML
INTRAVENOUS | Status: AC
  Filled 2017-06-27: qty 5

## 2017-06-27 MED ORDER — FAMOTIDINE IN NACL 20-0.9 MG/50ML-% IV SOLN
INTRAVENOUS | Status: AC
  Filled 2017-06-27: qty 50

## 2017-06-27 MED ORDER — SODIUM CHLORIDE 0.9 % IV SOLN
Freq: Once | INTRAVENOUS | Status: AC
  Administered 2017-06-27: 15:00:00 via INTRAVENOUS

## 2017-06-27 MED ORDER — SODIUM CHLORIDE 0.9% FLUSH
10.0000 mL | INTRAVENOUS | Status: DC | PRN
  Administered 2017-06-27: 10 mL
  Filled 2017-06-27: qty 10

## 2017-06-27 MED ORDER — FAMOTIDINE IN NACL 20-0.9 MG/50ML-% IV SOLN
20.0000 mg | Freq: Once | INTRAVENOUS | Status: AC
  Administered 2017-06-27: 20 mg via INTRAVENOUS

## 2017-06-27 MED ORDER — SODIUM CHLORIDE 0.9% FLUSH
10.0000 mL | Freq: Once | INTRAVENOUS | Status: AC
  Administered 2017-06-27: 10 mL
  Filled 2017-06-27: qty 10

## 2017-06-27 MED ORDER — SODIUM CHLORIDE 0.9 % IV SOLN
249.2000 mg | Freq: Once | INTRAVENOUS | Status: AC
  Administered 2017-06-27: 250 mg via INTRAVENOUS
  Filled 2017-06-27: qty 25

## 2017-06-27 MED ORDER — SODIUM CHLORIDE 0.9 % IV SOLN
10.0000 mg | Freq: Once | INTRAVENOUS | Status: DC
  Filled 2017-06-27: qty 1

## 2017-06-27 MED ORDER — SODIUM CHLORIDE 0.9 % IV SOLN
80.0000 mg/m2 | Freq: Once | INTRAVENOUS | Status: AC
  Administered 2017-06-27: 150 mg via INTRAVENOUS
  Filled 2017-06-27: qty 25

## 2017-06-27 MED ORDER — PALONOSETRON HCL INJECTION 0.25 MG/5ML
0.2500 mg | Freq: Once | INTRAVENOUS | Status: AC
  Administered 2017-06-27: 0.25 mg via INTRAVENOUS

## 2017-06-27 MED ORDER — DIPHENHYDRAMINE HCL 50 MG/ML IJ SOLN
25.0000 mg | Freq: Once | INTRAMUSCULAR | Status: AC
  Administered 2017-06-27: 25 mg via INTRAVENOUS

## 2017-06-27 NOTE — Patient Instructions (Signed)
Cancer Center Discharge Instructions for Patients Receiving Chemotherapy  Today you received the following chemotherapy agents: Paclitaxel (Taxol) and Carboplatin  To help prevent nausea and vomiting after your treatment, we encourage you to take your nausea medication  as prescribed.    If you develop nausea and vomiting that is not controlled by your nausea medication, call the clinic.   BELOW ARE SYMPTOMS THAT SHOULD BE REPORTED IMMEDIATELY:  *FEVER GREATER THAN 100.5 F  *CHILLS WITH OR WITHOUT FEVER  NAUSEA AND VOMITING THAT IS NOT CONTROLLED WITH YOUR NAUSEA MEDICATION  *UNUSUAL SHORTNESS OF BREATH  *UNUSUAL BRUISING OR BLEEDING  TENDERNESS IN MOUTH AND THROAT WITH OR WITHOUT PRESENCE OF ULCERS  *URINARY PROBLEMS  *BOWEL PROBLEMS  UNUSUAL RASH Items with * indicate a potential emergency and should be followed up as soon as possible.  Feel free to call the clinic should you have any questions or concerns. The clinic phone number is [phone].  Please show the CHEMO ALERT CARD at check-in to the Emergency Department and triage nurse.   

## 2017-07-04 ENCOUNTER — Inpatient Hospital Stay: Payer: 59

## 2017-07-04 ENCOUNTER — Inpatient Hospital Stay: Payer: 59 | Attending: Oncology

## 2017-07-04 VITALS — HR 91

## 2017-07-04 DIAGNOSIS — Z171 Estrogen receptor negative status [ER-]: Principal | ICD-10-CM

## 2017-07-04 DIAGNOSIS — C50411 Malignant neoplasm of upper-outer quadrant of right female breast: Secondary | ICD-10-CM | POA: Diagnosis not present

## 2017-07-04 DIAGNOSIS — Z5111 Encounter for antineoplastic chemotherapy: Secondary | ICD-10-CM | POA: Diagnosis not present

## 2017-07-04 LAB — CBC WITH DIFFERENTIAL/PLATELET
BASOS ABS: 0 10*3/uL (ref 0.0–0.1)
Basophils Relative: 1 %
Eosinophils Absolute: 0.3 10*3/uL (ref 0.0–0.5)
Eosinophils Relative: 8 %
HEMATOCRIT: 30.9 % — AB (ref 34.8–46.6)
Hemoglobin: 10.8 g/dL — ABNORMAL LOW (ref 11.6–15.9)
LYMPHS ABS: 1 10*3/uL (ref 0.9–3.3)
LYMPHS PCT: 27 %
MCH: 34.6 pg — AB (ref 25.1–34.0)
MCHC: 35 g/dL (ref 31.5–36.0)
MCV: 98.9 fL (ref 79.5–101.0)
Monocytes Absolute: 0.3 10*3/uL (ref 0.1–0.9)
Monocytes Relative: 8 %
NEUTROS ABS: 2.1 10*3/uL (ref 1.5–6.5)
Neutrophils Relative %: 56 %
Platelets: 283 10*3/uL (ref 145–400)
RBC: 3.13 MIL/uL — AB (ref 3.70–5.45)
RDW: 15.3 % — ABNORMAL HIGH (ref 11.2–14.5)
WBC: 3.8 10*3/uL — AB (ref 3.9–10.3)

## 2017-07-04 LAB — COMPREHENSIVE METABOLIC PANEL WITH GFR
ALT: 29 U/L (ref 0–55)
AST: 21 U/L (ref 5–34)
Albumin: 4.1 g/dL (ref 3.5–5.0)
Alkaline Phosphatase: 74 U/L (ref 40–150)
Anion gap: 11 (ref 3–11)
BUN: 10 mg/dL (ref 7–26)
CO2: 25 mmol/L (ref 22–29)
Calcium: 9.2 mg/dL (ref 8.4–10.4)
Chloride: 103 mmol/L (ref 98–109)
Creatinine, Ser: 0.74 mg/dL (ref 0.60–1.10)
GFR calc Af Amer: >60 mL/min
GFR calc non Af Amer: >60 mL/min
Glucose, Bld: 140 mg/dL (ref 70–140)
Potassium: 3.9 mmol/L (ref 3.5–5.1)
Sodium: 139 mmol/L (ref 136–145)
Total Bilirubin: 0.4 mg/dL (ref 0.2–1.2)
Total Protein: 6.8 g/dL (ref 6.4–8.3)

## 2017-07-04 MED ORDER — SODIUM CHLORIDE 0.9 % IV SOLN
Freq: Once | INTRAVENOUS | Status: AC
  Administered 2017-07-04: 15:00:00 via INTRAVENOUS

## 2017-07-04 MED ORDER — SODIUM CHLORIDE 0.9 % IV SOLN
25.0000 mg | Freq: Once | INTRAVENOUS | Status: AC
  Administered 2017-07-04: 25 mg via INTRAVENOUS
  Filled 2017-07-04: qty 0.5

## 2017-07-04 MED ORDER — SODIUM CHLORIDE 0.9 % IV SOLN
4.0000 mg | Freq: Once | INTRAVENOUS | Status: DC
  Filled 2017-07-04: qty 0.4

## 2017-07-04 MED ORDER — SODIUM CHLORIDE 0.9% FLUSH
10.0000 mL | INTRAVENOUS | Status: DC | PRN
  Administered 2017-07-04: 10 mL
  Filled 2017-07-04: qty 10

## 2017-07-04 MED ORDER — DEXAMETHASONE SODIUM PHOSPHATE 10 MG/ML IJ SOLN
INTRAMUSCULAR | Status: AC
  Filled 2017-07-04: qty 1

## 2017-07-04 MED ORDER — PALONOSETRON HCL INJECTION 0.25 MG/5ML
INTRAVENOUS | Status: AC
  Filled 2017-07-04: qty 5

## 2017-07-04 MED ORDER — DEXAMETHASONE 4 MG PO TABS
ORAL_TABLET | ORAL | Status: AC
  Filled 2017-07-04: qty 1

## 2017-07-04 MED ORDER — HEPARIN SOD (PORK) LOCK FLUSH 100 UNIT/ML IV SOLN
500.0000 [IU] | Freq: Once | INTRAVENOUS | Status: AC | PRN
  Administered 2017-07-04: 500 [IU]
  Filled 2017-07-04: qty 5

## 2017-07-04 MED ORDER — SODIUM CHLORIDE 0.9 % IV SOLN
249.2000 mg | Freq: Once | INTRAVENOUS | Status: AC
  Administered 2017-07-04: 250 mg via INTRAVENOUS
  Filled 2017-07-04: qty 25

## 2017-07-04 MED ORDER — PALONOSETRON HCL INJECTION 0.25 MG/5ML
0.2500 mg | Freq: Once | INTRAVENOUS | Status: AC
  Administered 2017-07-04: 0.25 mg via INTRAVENOUS

## 2017-07-04 MED ORDER — FAMOTIDINE IN NACL 20-0.9 MG/50ML-% IV SOLN
20.0000 mg | Freq: Once | INTRAVENOUS | Status: AC
  Administered 2017-07-04: 20 mg via INTRAVENOUS

## 2017-07-04 MED ORDER — FAMOTIDINE IN NACL 20-0.9 MG/50ML-% IV SOLN
INTRAVENOUS | Status: AC
  Filled 2017-07-04: qty 50

## 2017-07-04 MED ORDER — DIPHENHYDRAMINE HCL 50 MG/ML IJ SOLN: 25.0000 mg | Freq: Once | INTRAMUSCULAR | Status: DC

## 2017-07-04 MED ORDER — SODIUM CHLORIDE 0.9% FLUSH
10.0000 mL | Freq: Once | INTRAVENOUS | Status: AC
  Administered 2017-07-04: 10 mL
  Filled 2017-07-04: qty 10

## 2017-07-04 MED ORDER — SODIUM CHLORIDE 0.9 % IV SOLN
80.0000 mg/m2 | Freq: Once | INTRAVENOUS | Status: AC
  Administered 2017-07-04: 150 mg via INTRAVENOUS
  Filled 2017-07-04: qty 25

## 2017-07-04 MED ORDER — DEXAMETHASONE SODIUM PHOSPHATE 10 MG/ML IJ SOLN
4.0000 mg | Freq: Once | INTRAMUSCULAR | Status: AC
  Administered 2017-07-04: 4 mg via INTRAVENOUS

## 2017-07-04 NOTE — Addendum Note (Signed)
Addended by: Delane Ginger on: 07/04/2017 02:49 PM   Modules accepted: Orders

## 2017-07-04 NOTE — Patient Instructions (Signed)
[facility] Discharge Instructions for Patients Receiving Chemotherapy  Today you received the following chemotherapy agents: paclitaxel (Taxol) and Carboplatin.  To help prevent nausea and vomiting after your treatment, we encourage you to take your nausea medication as prescribed.  If you develop nausea and vomiting that is not controlled by your nausea medication, call the clinic.   BELOW ARE SYMPTOMS THAT SHOULD BE REPORTED IMMEDIATELY:  *FEVER GREATER THAN 100.5 F  *CHILLS WITH OR WITHOUT FEVER  NAUSEA AND VOMITING THAT IS NOT CONTROLLED WITH YOUR NAUSEA MEDICATION  *UNUSUAL SHORTNESS OF BREATH  *UNUSUAL BRUISING OR BLEEDING  TENDERNESS IN MOUTH AND THROAT WITH OR WITHOUT PRESENCE OF ULCERS  *URINARY PROBLEMS  *BOWEL PROBLEMS  UNUSUAL RASH Items with * indicate a potential emergency and should be followed up as soon as possible.  Feel free to call the clinic should you have any questions or concerns. The clinic phone number is [phone].  Please show the [place] at check-in to the Emergency Department and triage nurse.

## 2017-07-04 NOTE — Addendum Note (Signed)
Addended by: Johann Capers on: 07/04/2017 03:38 PM   Modules accepted: Orders

## 2017-07-11 ENCOUNTER — Inpatient Hospital Stay (HOSPITAL_BASED_OUTPATIENT_CLINIC_OR_DEPARTMENT_OTHER): Payer: 59 | Admitting: Hematology and Oncology

## 2017-07-11 ENCOUNTER — Inpatient Hospital Stay: Payer: 59

## 2017-07-11 DIAGNOSIS — C50411 Malignant neoplasm of upper-outer quadrant of right female breast: Secondary | ICD-10-CM | POA: Diagnosis not present

## 2017-07-11 DIAGNOSIS — Z171 Estrogen receptor negative status [ER-]: Secondary | ICD-10-CM | POA: Diagnosis not present

## 2017-07-11 DIAGNOSIS — Z5111 Encounter for antineoplastic chemotherapy: Secondary | ICD-10-CM | POA: Diagnosis not present

## 2017-07-11 LAB — COMPREHENSIVE METABOLIC PANEL
ALT: 27 U/L (ref 0–55)
ANION GAP: 8 (ref 3–11)
AST: 23 U/L (ref 5–34)
Albumin: 3.9 g/dL (ref 3.5–5.0)
Alkaline Phosphatase: 74 U/L (ref 40–150)
BUN: 9 mg/dL (ref 7–26)
CHLORIDE: 105 mmol/L (ref 98–109)
CO2: 26 mmol/L (ref 22–29)
Calcium: 9.4 mg/dL (ref 8.4–10.4)
Creatinine, Ser: 0.68 mg/dL (ref 0.60–1.10)
Glucose, Bld: 102 mg/dL (ref 70–140)
POTASSIUM: 3.9 mmol/L (ref 3.5–5.1)
SODIUM: 139 mmol/L (ref 136–145)
Total Bilirubin: 0.4 mg/dL (ref 0.2–1.2)
Total Protein: 6.5 g/dL (ref 6.4–8.3)

## 2017-07-11 LAB — CBC WITH DIFFERENTIAL/PLATELET
Basophils Absolute: 0 10*3/uL (ref 0.0–0.1)
Basophils Relative: 0 %
EOS ABS: 0.2 10*3/uL (ref 0.0–0.5)
EOS PCT: 6 %
HCT: 28.8 % — ABNORMAL LOW (ref 34.8–46.6)
Hemoglobin: 10 g/dL — ABNORMAL LOW (ref 11.6–15.9)
LYMPHS ABS: 0.9 10*3/uL (ref 0.9–3.3)
LYMPHS PCT: 32 %
MCH: 34.4 pg — AB (ref 25.1–34.0)
MCHC: 34.7 g/dL (ref 31.5–36.0)
MCV: 99 fL (ref 79.5–101.0)
MONO ABS: 0.2 10*3/uL (ref 0.1–0.9)
MONOS PCT: 7 %
Neutro Abs: 1.6 10*3/uL (ref 1.5–6.5)
Neutrophils Relative %: 55 %
PLATELETS: 303 10*3/uL (ref 145–400)
RBC: 2.91 MIL/uL — ABNORMAL LOW (ref 3.70–5.45)
RDW: 15 % — AB (ref 11.2–14.5)
WBC: 2.9 10*3/uL — ABNORMAL LOW (ref 3.9–10.3)

## 2017-07-11 MED ORDER — SODIUM CHLORIDE 0.9% FLUSH
10.0000 mL | INTRAVENOUS | Status: DC | PRN
  Administered 2017-07-11: 10 mL
  Filled 2017-07-11: qty 10

## 2017-07-11 MED ORDER — FAMOTIDINE IN NACL 20-0.9 MG/50ML-% IV SOLN: 20.0000 mg | Freq: Once | INTRAVENOUS | Status: DC

## 2017-07-11 MED ORDER — SODIUM CHLORIDE 0.9% FLUSH
10.0000 mL | Freq: Once | INTRAVENOUS | Status: AC
  Administered 2017-07-11: 10 mL
  Filled 2017-07-11: qty 10

## 2017-07-11 MED ORDER — DEXAMETHASONE SODIUM PHOSPHATE 10 MG/ML IJ SOLN
4.0000 mg | Freq: Once | INTRAMUSCULAR | Status: AC
  Administered 2017-07-11: 4 mg via INTRAVENOUS

## 2017-07-11 MED ORDER — CARBOPLATIN CHEMO INJECTION 450 MG/45ML
250.0000 mg | Freq: Once | INTRAVENOUS | Status: AC
  Administered 2017-07-11: 250 mg via INTRAVENOUS
  Filled 2017-07-11: qty 25

## 2017-07-11 MED ORDER — SODIUM CHLORIDE 0.9 % IV SOLN
25.0000 mg | Freq: Once | INTRAVENOUS | Status: AC
  Administered 2017-07-11: 25 mg via INTRAVENOUS
  Filled 2017-07-11: qty 0.5

## 2017-07-11 MED ORDER — PALONOSETRON HCL INJECTION 0.25 MG/5ML
INTRAVENOUS | Status: AC
  Filled 2017-07-11: qty 5

## 2017-07-11 MED ORDER — SODIUM CHLORIDE 0.9 % IV SOLN
Freq: Once | INTRAVENOUS | Status: AC
  Administered 2017-07-11: 14:00:00 via INTRAVENOUS

## 2017-07-11 MED ORDER — HEPARIN SOD (PORK) LOCK FLUSH 100 UNIT/ML IV SOLN
500.0000 [IU] | Freq: Once | INTRAVENOUS | Status: AC | PRN
  Administered 2017-07-11: 500 [IU]
  Filled 2017-07-11: qty 5

## 2017-07-11 MED ORDER — PALONOSETRON HCL INJECTION 0.25 MG/5ML
0.2500 mg | Freq: Once | INTRAVENOUS | Status: AC
  Administered 2017-07-11: 0.25 mg via INTRAVENOUS

## 2017-07-11 MED ORDER — SODIUM CHLORIDE 0.9 % IV SOLN
20.0000 mg | Freq: Once | INTRAVENOUS | Status: AC
  Administered 2017-07-11: 20 mg via INTRAVENOUS
  Filled 2017-07-11: qty 2

## 2017-07-11 MED ORDER — DEXAMETHASONE SODIUM PHOSPHATE 10 MG/ML IJ SOLN
INTRAMUSCULAR | Status: AC
  Filled 2017-07-11: qty 1

## 2017-07-11 MED ORDER — SODIUM CHLORIDE 0.9 % IV SOLN
80.0000 mg/m2 | Freq: Once | INTRAVENOUS | Status: AC
  Administered 2017-07-11: 150 mg via INTRAVENOUS
  Filled 2017-07-11: qty 25

## 2017-07-11 NOTE — Patient Instructions (Signed)
[facility] Discharge Instructions for Patients Receiving Chemotherapy  Today you received the following chemotherapy agents: Paclitaxel (Taxol) and Carboplatin (Paraplatin).  To help prevent nausea and vomiting after your treatment, we encourage you to take your nausea medication as prescribed. Received Aloxi during treatment-->take Compazine (not Zofran) for the next 3 days.  If you develop nausea and vomiting that is not controlled by your nausea medication, call the clinic.   BELOW ARE SYMPTOMS THAT SHOULD BE REPORTED IMMEDIATELY:  *FEVER GREATER THAN 100.5 F  *CHILLS WITH OR WITHOUT FEVER  NAUSEA AND VOMITING THAT IS NOT CONTROLLED WITH YOUR NAUSEA MEDICATION  *UNUSUAL SHORTNESS OF BREATH  *UNUSUAL BRUISING OR BLEEDING  TENDERNESS IN MOUTH AND THROAT WITH OR WITHOUT PRESENCE OF ULCERS  *URINARY PROBLEMS  *BOWEL PROBLEMS  UNUSUAL RASH Items with * indicate a potential emergency and should be followed up as soon as possible.  Feel free to call the clinic should you have any questions or concerns. The clinic phone number is [phone].  Please show the CHEMO ALERT CARD at check-in to the Emergency Department and triage nurse.   

## 2017-07-11 NOTE — Progress Notes (Signed)
Patient Care Team: Jennifer Shirts, MD as PCP - General (Internal Medicine) Jennifer Sine, MD as Consulting Physician (Dermatology) Jennifer Beck, [name] as Referring Physician (Chiropractic Medicine) Jennifer Overall, MD as Consulting Physician (General Surgery) Jennifer Gibson, MD as Attending Physician (Radiation Oncology) Jennifer Fus, MD as Consulting Physician (Obstetrics and Gynecology)  DIAGNOSIS:  Encounter Diagnosis  Name Primary?  . Malignant neoplasm of upper-outer quadrant of right breast in female, estrogen receptor negative ([place])     SUMMARY OF ONCOLOGIC HISTORY:   Malignant neoplasm of upper-outer quadrant of right breast in female, estrogen receptor negative ([place])   03/26/2017 Initial Diagnosis    Malignant neoplasm of upper-outer quadrant of right breast in female, estrogen receptor negative ([place])      05/02/2017 Genetic Testing    The patient had genetic testing due to a personal history of breast cancer and a family history of breast, kidney, and colon cancer. There was also a family history of a CHEK2 mutation identified in Jennifer Beck maternal cousin.   The Common Hereditary Cancer Panel + Renal/Urinary Tract Cancer Panel was ordered (60 genes). APC, ATM, AXIN2, BAP1, BARD1, BMPR1A, BRCA1, BRCA2, BRIP1, BUB1B, CDC73, CDH1, CDK4, CDKN1C, CDKN2A (p14ARF),CDKN2A (p16INK4a), CEP57, CHEK2, CTNNA1, DICER1, DIS3L2, EPCAM*, FH, FLCN, GPC3, GREM1*, KIT, MEN1, MET, MLH1, MSH2, MSH3, MSH6, MUTYH, NBN, NF1, PALB2, PDGFRA, PMS2, POLD1, POLE, PTEN, RAD50, RAD51C, RAD51D, SDHB, SDHC, SDHD, SMAD4, SMARCA4, SMARCB1, STK11, TP53, TSC1, TSC2, VHL, WT1. The following genes were evaluated for sequence changes only: HOXB13*, MITF*, NTHL1*, SDHA NTHL1*, SDHA  Results: No pathogenic variants were identified.  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  The familial CHEK2 mutation WAS NOT detected.  The date of this test report is  05/02/2017.        CHIEF COMPLIANT: Cycle 6 carbo- taxol  INTERVAL HISTORY: Jennifer Beck is a 52 year old with above-mentioned history of right breast cancer is currently on neoadjuvant chemotherapy.  Today is cycle 6 of carboplatin and paclitaxel.  She has noticed changes in her nails.  But so far no neuropathy.  She is using ice to decrease her hands as much as possible.  She has not been able to do it all the time during chemo but she is trying to do as much as she can.  REVIEW OF SYSTEMS:   Constitutional: Denies fevers, chills or abnormal weight loss Eyes: Denies blurriness of vision Ears, nose, mouth, throat, and face: Denies mucositis or sore throat Respiratory: Denies cough, dyspnea or wheezes Cardiovascular: Denies palpitation, chest discomfort Gastrointestinal:  Denies nausea, heartburn or change in bowel habits Skin: Denies abnormal skin rashes Lymphatics: Denies new lymphadenopathy or easy bruising Neurological:Denies numbness, tingling or new weaknesses Behavioral/Psych: Mood is stable, no new changes  Extremities: No lower extremity edema  All other systems were reviewed with the patient and are negative.  I have reviewed the past medical history, past surgical history, social history and family history with the patient and they are unchanged from previous note.  ALLERGIES:  is allergic to codeine and biaxin [clarithromycin].  MEDICATIONS:  Current Outpatient Medications  Medication Sig Dispense Refill  . acetaminophen (TYLENOL) 325 MG tablet Take 650 mg by mouth every 6 (six) hours as needed.    . ALPRAZolam (XANAX) 0.25 MG tablet Take 0.25 mg by mouth daily as needed for anxiety.    . Cholecalciferol (VITAMIN D) 2000 units CAPS Take 2,000 Units by mouth daily.    . Diphenhyd-Hydrocort-Nystatin (FIRST-DUKES MOUTHWASH)  SUSP 5-10 ml qid prn swish swallow or spit 240 mL 3  . Evening Primrose Oil 1000 MG CAPS Take 1 capsule by mouth every evening.     . famotidine  (PEPCID) 10 MG tablet Take 10 mg by mouth 2 (two) times daily.    . fluocinonide cream (LIDEX) 7.85 % Apply 1 application topically 2 (two) times daily as needed.     [name] glucosamine-chondroitin 500-400 MG tablet Take 1 tablet by mouth daily.     [name] ibuprofen (ADVIL,MOTRIN) 200 MG tablet Take 400 mg by mouth every 8 (eight) hours as needed for headache or mild pain.     . Melatonin 5 MG TABS Take 5 mg by mouth at bedtime as needed (sleep).     . Multiple Minerals-Vitamins (CALCIUM-MAGNESIUM-ZINC) TABS Take 1 tablet by mouth daily.     . Multiple Vitamins-Minerals (MULTIVITAMINS THER. W/MINERALS) TABS Take 1 tablet by mouth daily.     . Omega-3 Fatty Acids (FISH OIL) 1000 MG CAPS Take 1,000 mg by mouth 2 (two) times a week.     [name] omeprazole (PRILOSEC) 40 MG capsule Take 1 capsule (40 mg total) by mouth 2 (two) times daily. (Patient taking differently: Take 40 mg by mouth daily. ) 60 capsule 0  . ondansetron (ZOFRAN) 8 MG tablet Take 1 tablet (8 mg total) by mouth every 8 (eight) hours as needed for nausea or vomiting. 20 tablet 3  . Polyethyl Glycol-Propyl Glycol (SYSTANE OP) Apply 1 drop to eye 2 (two) times daily.    [name] VITAMIN E PO Take 1 tablet by mouth daily.      No current facility-administered medications for this visit.    Facility-Administered Medications Ordered in Other Visits  Medication Dose Route Frequency Provider Last Rate Last Dose  . sodium chloride flush (NS) 0.9 % injection 10 mL  10 mL Intracatheter PRN Magrinat, Virgie Dad, MD   10 mL at 07/04/17 1749    PHYSICAL EXAMINATION: ECOG PERFORMANCE STATUS: 1 - Symptomatic but completely ambulatory  Vitals:   07/11/17 1339  BP: 126/84  Pulse: 98  Resp: 18  Temp: 98.1 F (36.7 C)  SpO2: 100%   Filed Weights   07/11/17 1339  Weight: 161 lb 3.2 oz (73.1 kg)    GENERAL:alert, no distress and comfortable SKIN: skin color, texture, turgor are normal, no rashes or significant lesions EYES: normal, Conjunctiva are pink and  non-injected, sclera clear OROPHARYNX:no exudate, no erythema and lips, buccal mucosa, and tongue normal  NECK: supple, thyroid normal size, non-tender, without nodularity LYMPH:  no palpable lymphadenopathy in the cervical, axillary or inguinal LUNGS: clear to auscultation and percussion with normal breathing effort HEART: regular rate & rhythm and no murmurs and no lower extremity edema ABDOMEN:abdomen soft, non-tender and normal bowel sounds MUSCULOSKELETAL:no cyanosis of digits and no clubbing  NEURO: alert & oriented x 3 with fluent speech, no focal motor/sensory deficits EXTREMITIES: No lower extremity edema  LABORATORY DATA:  I have reviewed the data as listed CMP Latest Ref Rng & Units 07/04/2017 06/27/2017 06/20/2017  Glucose 70 - 140 mg/dL 140 127 124  BUN 7 - 26 mg/dL '10 11 8  ' Creatinine 0.60 - 1.10 mg/dL 0.74 0.66 0.73  Sodium 136 - 145 mmol/L 139 140 141  Potassium 3.5 - 5.1 mmol/L 3.9 3.7 3.9  Chloride 98 - 109 mmol/L 103 106 105  CO2 22 - 29 mmol/L '25 25 26  ' Calcium 8.4 - 10.4 mg/dL 9.2 9.0 9.1  Total Protein 6.4 - 8.3  g/dL 6.8 6.4 6.3(L)  Total Bilirubin 0.2 - 1.2 mg/dL 0.4 0.3 0.3  Alkaline Phos 40 - 150 U/L 74 65 63  AST 5 - 34 U/L '21 18 18  ' ALT 0 - 55 U/L '29 24 23    ' Lab Results  Component Value Date   WBC 2.9 (L) 07/11/2017   HGB 10.0 (L) 07/11/2017   HCT 28.8 (L) 07/11/2017   MCV 99.0 07/11/2017   PLT 303 07/11/2017   NEUTROABS 1.6 07/11/2017    ASSESSMENT & PLAN:  Malignant neoplasm of upper-outer quadrant of right breast in female, estrogen receptor negative ([place]) 52 y.o. [place] woman status post right breast upper inner quadrant biopsy March 21, 2017 for a clinical T2 N0  Stage IIB invasive ductal carcinoma, grade 3, triple negative, with an MIB-1 of 80%.             (a) biopsy of 2 additional suspicious areas in the right breast and one in the left breast scheduled for April 11, 2017  (1) neoadjuvant chemotherapy consisting of doxorubicin  and cyclophosphamide in dose dense fashion x4 completed 05/23/2017, followed by paclitaxel/carboplatin weekly x12, first dose 06/06/2017  (2) definitive surgery to follow  (3) adjuvant radiation as appropriate  (4) genetics testing 04/22/2017 through the common hereditary cancer panel plus renal/urinary tract cancel panel showed no deleterious mutations in APC, ATM, AXIN2, BAP1, BARD1, BMPR1A, BRCA1, BRCA2, BRIP1, BUB1B, CDC73, CDH1, CDK4, CDKN1C, CDKN2A (p14ARF), CDKN2A (p16INK4a), CEP57, CHEK2, CTNNA1, DICER1, DIS3L2, EPCAM*, FH, FLCN, GPC3, GREM1*, KIT, MEN1, MET, MLH1,MSH2, MSH3, MSH6, MUTYH, NBN, NF1, PALB2, PDGFRA, PMS2, POLD1, POLE, PTEN, RAD50, RAD51C, RAD51D, SDHB,SDHC, SDHD, SMAD4, SMARCA4, SMARCB1, STK11, TP53, TSC1, TSC2, VHL, WT1.The following genes were evaluated for sequence changes only: HOXB13*, MITF*, NTHL1*, SDHA             (a) there was a Variant of Uncertain Significance identified in POLD1, namely c.2953C>T (p.Arg985Trp)  PLAN: Jennifer Beck is tolerating the Taxol remarkably well and will proceed to her sixth of 12 planned doses today. Taxol toxicities: 1.  Patient is trying to prevent neuropathy by using ice cold water during infusion Denies any nausea or vomiting. 2. neutrophil count 1600 today: I discussed with her that sometimes if the neutrophil count does not come up for the chemo, we may have to either hold treatment or plan to give her Neupogen before each treatment to bring the neutrophil counts up.  It appears that the patient is thinking about getting mastectomies after completion of neoadjuvant chemotherapy.  She has not finalized her decision.  Patient will come weekly for chemotherapy in 2 weeks for follow-up with Dr. Jana Hakim  I spent 25 minutes talking to the patient of which more than half was spent in counseling and coordination of care.  No orders of the defined types were placed in this encounter.  The patient has a good understanding of the Beck  plan. she agrees with it. she will call with any problems that may develop before the next visit here.   Harriette Ohara, MD 07/11/17

## 2017-07-11 NOTE — Assessment & Plan Note (Signed)
52 y.o. [place] woman status post right breast upper inner quadrant biopsy March 21, 2017 for a clinical T2 N0  Stage IIB invasive ductal carcinoma, grade 3, triple negative, with an MIB-1 of 80%.             (a) biopsy of 2 additional suspicious areas in the right breast and one in the left breast scheduled for April 11, 2017  (1) neoadjuvant chemotherapy consisting of doxorubicin and cyclophosphamide in dose dense fashion x4 completed 05/23/2017, followed by paclitaxel/carboplatin weekly x12, first dose 06/06/2017  (2) definitive surgery to follow  (3) adjuvant radiation as appropriate  (4) genetics testing 04/22/2017 through the common hereditary cancer panel plus renal/urinary tract cancel panel showed no deleterious mutations in APC, ATM, AXIN2, BAP1, BARD1, BMPR1A, BRCA1, BRCA2, BRIP1, BUB1B, CDC73, CDH1, CDK4, CDKN1C, CDKN2A (p14ARF), CDKN2A (p16INK4a), CEP57, CHEK2, CTNNA1, DICER1, DIS3L2, EPCAM*, FH, FLCN, GPC3, GREM1*, KIT, MEN1, MET, MLH1,MSH2, MSH3, MSH6, MUTYH, NBN, NF1, PALB2, PDGFRA, PMS2, POLD1, POLE, PTEN, RAD50, RAD51C, RAD51D, SDHB,SDHC, SDHD, SMAD4, SMARCA4, SMARCB1, STK11, TP53, TSC1, TSC2, VHL, WT1.The following genes were evaluated for sequence changes only: HOXB13*, MITF*, NTHL1*, SDHA             (a) there was a Variant of Uncertain Significance identified in POLD1, namely c.2953C>T (p.Arg985Trp)  PLAN: Daliana is tolerating the Taxol remarkably well and will proceed to her sixth of 12 planned doses today. Taxol toxicities: 1.  Patient try to prevent neuropathy by using ice cold water during infusion Denies any nausea or vomiting.  Patient will come weekly for chemotherapy in 2 weeks for follow-up with Dr. Jana Hakim

## 2017-07-12 ENCOUNTER — Encounter: Payer: Self-pay | Admitting: *Deleted

## 2017-07-12 LAB — VITAMIN D 25 HYDROXY (VIT D DEFICIENCY, FRACTURES): VIT D 25 HYDROXY: 28.8 ng/mL — AB (ref 30.0–100.0)

## 2017-07-15 ENCOUNTER — Telehealth: Payer: Self-pay | Admitting: *Deleted

## 2017-07-15 NOTE — Telephone Encounter (Signed)
Per review with MD- this RN spoke with pt to inform her of his request for her to initiate omeprazole nightly.  Above discussed with Clorinda who verbalized understanding.

## 2017-07-15 NOTE — Telephone Encounter (Signed)
This RN returned call per VM left by pt -  Beyounce states she developed " like someone kicked me in the stomach " feelings over the weekend.  She had her 6 the carbo/taxol on 2/14 with symptoms starting on Friday late afternoon.  She feels symptoms are from below the breast bone down - " not like acid reflux ".  She does have a bitter taste in her mouth - which she states is usual for her post chemo.  Per discussion - Dorise states she had stopped the pepcid and omeprazole " because I wasn't having any acid reflux "  She ate a bland diet which seemed to help - baked potato - oatmeal and nongreasy hamburger.  Food seemed to lessen symptoms.  She she did resume the pepcid over the weekend and states today she is feeling better.  Of note she is having bowel movements facilitated with use of miralax - stools are hard and dark without any blood or coffee ground appearance.  This note will be reviewed with MD for further recommendations.

## 2017-07-18 ENCOUNTER — Other Ambulatory Visit: Payer: Self-pay | Admitting: Oncology

## 2017-07-18 ENCOUNTER — Inpatient Hospital Stay: Payer: 59

## 2017-07-18 VITALS — BP 129/76 | HR 98 | Temp 97.8°F | Resp 18

## 2017-07-18 DIAGNOSIS — C50411 Malignant neoplasm of upper-outer quadrant of right female breast: Secondary | ICD-10-CM

## 2017-07-18 DIAGNOSIS — Z171 Estrogen receptor negative status [ER-]: Principal | ICD-10-CM

## 2017-07-18 DIAGNOSIS — Z5111 Encounter for antineoplastic chemotherapy: Secondary | ICD-10-CM | POA: Diagnosis not present

## 2017-07-18 LAB — COMPREHENSIVE METABOLIC PANEL
ALK PHOS: 75 U/L (ref 40–150)
ALT: 32 U/L (ref 0–55)
AST: 23 U/L (ref 5–34)
Albumin: 3.9 g/dL (ref 3.5–5.0)
Anion gap: 8 (ref 3–11)
BILIRUBIN TOTAL: 0.3 mg/dL (ref 0.2–1.2)
BUN: 8 mg/dL (ref 7–26)
CALCIUM: 9.1 mg/dL (ref 8.4–10.4)
CO2: 25 mmol/L (ref 22–29)
CREATININE: 0.66 mg/dL (ref 0.60–1.10)
Chloride: 107 mmol/L (ref 98–109)
Glucose, Bld: 127 mg/dL (ref 70–140)
Potassium: 3.6 mmol/L (ref 3.5–5.1)
Sodium: 140 mmol/L (ref 136–145)
TOTAL PROTEIN: 6.4 g/dL (ref 6.4–8.3)

## 2017-07-18 LAB — CBC WITH DIFFERENTIAL/PLATELET
BASOS ABS: 0 10*3/uL (ref 0.0–0.1)
BASOS PCT: 1 %
EOS ABS: 0.1 10*3/uL (ref 0.0–0.5)
Eosinophils Relative: 5 %
HEMATOCRIT: 27.7 % — AB (ref 34.8–46.6)
Hemoglobin: 9.6 g/dL — ABNORMAL LOW (ref 11.6–15.9)
Lymphocytes Relative: 30 %
Lymphs Abs: 0.8 10*3/uL — ABNORMAL LOW (ref 0.9–3.3)
MCH: 35.1 pg — ABNORMAL HIGH (ref 25.1–34.0)
MCHC: 34.7 g/dL (ref 31.5–36.0)
MCV: 101 fL (ref 79.5–101.0)
Monocytes Absolute: 0.3 10*3/uL (ref 0.1–0.9)
Monocytes Relative: 10 %
NEUTROS ABS: 1.5 10*3/uL (ref 1.5–6.5)
NEUTROS PCT: 54 %
Platelets: 333 10*3/uL (ref 145–400)
RBC: 2.74 MIL/uL — ABNORMAL LOW (ref 3.70–5.45)
RDW: 15.8 % — ABNORMAL HIGH (ref 11.2–14.5)
WBC: 2.8 10*3/uL — ABNORMAL LOW (ref 3.9–10.3)

## 2017-07-18 MED ORDER — FAMOTIDINE IN NACL 20-0.9 MG/50ML-% IV SOLN: 20.0000 mg | Freq: Once | INTRAVENOUS | Status: DC

## 2017-07-18 MED ORDER — SODIUM CHLORIDE 0.9% FLUSH
10.0000 mL | INTRAVENOUS | Status: DC | PRN
  Administered 2017-07-18: 10 mL
  Filled 2017-07-18: qty 10

## 2017-07-18 MED ORDER — PALONOSETRON HCL INJECTION 0.25 MG/5ML
0.2500 mg | Freq: Once | INTRAVENOUS | Status: AC
  Administered 2017-07-18: 0.25 mg via INTRAVENOUS

## 2017-07-18 MED ORDER — SODIUM CHLORIDE 0.9 % IV SOLN
Freq: Once | INTRAVENOUS | Status: AC
  Administered 2017-07-18: 15:00:00 via INTRAVENOUS

## 2017-07-18 MED ORDER — SODIUM CHLORIDE 0.9 % IV SOLN
20.0000 mg | Freq: Once | INTRAVENOUS | Status: AC
  Administered 2017-07-18: 20 mg via INTRAVENOUS
  Filled 2017-07-18: qty 2

## 2017-07-18 MED ORDER — DEXAMETHASONE SODIUM PHOSPHATE 10 MG/ML IJ SOLN
INTRAMUSCULAR | Status: AC
  Filled 2017-07-18: qty 1

## 2017-07-18 MED ORDER — PALONOSETRON HCL INJECTION 0.25 MG/5ML
INTRAVENOUS | Status: AC
  Filled 2017-07-18: qty 5

## 2017-07-18 MED ORDER — SODIUM CHLORIDE 0.9 % IV SOLN
25.0000 mg | Freq: Once | INTRAVENOUS | Status: AC
  Administered 2017-07-18: 25 mg via INTRAVENOUS
  Filled 2017-07-18: qty 0.5

## 2017-07-18 MED ORDER — HEPARIN SOD (PORK) LOCK FLUSH 100 UNIT/ML IV SOLN
500.0000 [IU] | Freq: Once | INTRAVENOUS | Status: AC | PRN
  Administered 2017-07-18: 500 [IU]
  Filled 2017-07-18: qty 5

## 2017-07-18 MED ORDER — DEXAMETHASONE SODIUM PHOSPHATE 10 MG/ML IJ SOLN
4.0000 mg | Freq: Once | INTRAMUSCULAR | Status: AC
  Administered 2017-07-18: 4 mg via INTRAVENOUS

## 2017-07-18 MED ORDER — SODIUM CHLORIDE 0.9% FLUSH
10.0000 mL | Freq: Once | INTRAVENOUS | Status: AC
  Administered 2017-07-18: 10 mL
  Filled 2017-07-18: qty 10

## 2017-07-18 MED ORDER — SODIUM CHLORIDE 0.9 % IV SOLN
80.0000 mg/m2 | Freq: Once | INTRAVENOUS | Status: AC
  Administered 2017-07-18: 150 mg via INTRAVENOUS
  Filled 2017-07-18: qty 25

## 2017-07-18 MED ORDER — CARBOPLATIN CHEMO INJECTION 450 MG/45ML
249.2000 mg | Freq: Once | INTRAVENOUS | Status: AC
  Administered 2017-07-18: 250 mg via INTRAVENOUS
  Filled 2017-07-18: qty 25

## 2017-07-18 NOTE — Progress Notes (Signed)
skip

## 2017-07-18 NOTE — Patient Instructions (Signed)
[facility] Discharge Instructions for Patients Receiving Chemotherapy  Today you received the following chemotherapy agents: Paclitaxel (Taxol) and Carboplatin (Paraplatin).  To help prevent nausea and vomiting after your treatment, we encourage you to take your nausea medication as prescribed. Received Aloxi during treatment-->take Compazine (not Zofran) for the next 3 days.  If you develop nausea and vomiting that is not controlled by your nausea medication, call the clinic.   BELOW ARE SYMPTOMS THAT SHOULD BE REPORTED IMMEDIATELY:  *FEVER GREATER THAN 100.5 F  *CHILLS WITH OR WITHOUT FEVER  NAUSEA AND VOMITING THAT IS NOT CONTROLLED WITH YOUR NAUSEA MEDICATION  *UNUSUAL SHORTNESS OF BREATH  *UNUSUAL BRUISING OR BLEEDING  TENDERNESS IN MOUTH AND THROAT WITH OR WITHOUT PRESENCE OF ULCERS  *URINARY PROBLEMS  *BOWEL PROBLEMS  UNUSUAL RASH Items with * indicate a potential emergency and should be followed up as soon as possible.  Feel free to call the clinic should you have any questions or concerns. The clinic phone number is [phone].  Please show the CHEMO ALERT CARD at check-in to the Emergency Department and triage nurse.   

## 2017-07-19 ENCOUNTER — Telehealth: Payer: Self-pay | Admitting: Medical Oncology

## 2017-07-19 ENCOUNTER — Other Ambulatory Visit: Payer: Self-pay | Admitting: Medical Oncology

## 2017-07-19 DIAGNOSIS — Z171 Estrogen receptor negative status [ER-]: Principal | ICD-10-CM

## 2017-07-19 DIAGNOSIS — C50411 Malignant neoplasm of upper-outer quadrant of right female breast: Secondary | ICD-10-CM

## 2017-07-19 MED ORDER — OMEPRAZOLE 40 MG PO CPDR: 40.0000 mg | DELAYED_RELEASE_CAPSULE | Freq: Every day | ORAL | 0 refills | Status: DC

## 2017-07-19 NOTE — Telephone Encounter (Signed)
Prilosec refill needed asap  Only a few tablets left . Per nursing note on 2/18 I called in med refill for 1 po qhs.

## 2017-07-24 NOTE — Progress Notes (Signed)
[place]  [phone] [phone]     ID: Jennifer Beck DOB: 11-16-1965  MR#: [id number]  NTI#:[id number]  Patient Care Team: [name], MD as PCP - General (Internal Medicine) [name], MD as Consulting Physician (Dermatology) [name], DC as Referring Physician (Chiropractic Medicine) [name], MD as Consulting Physician (General Surgery) [name], MD as Attending Physician (Radiation Oncology) [name], MD as Consulting Physician (Obstetrics and Gynecology) OTHER MD:  CHIEF COMPLAINT: Triple negative breast cancer  CURRENT TREATMENT: Neoadjuvant chemotherapy  INTERVAL HISTORY: Jennifer Beck returns today for follow-up on treatment of her estrogen receptor negative breast cancer. She is receiving carboplatin and paclitaxel weekly, and today is day 1, cycle 8 of 12 planned doses, to be followed by definitive surgery. She is toleratint this well thus far.    REVIEW OF SYSTEMS:  Jennifer Beck reports that she has an appointment this week with Plastic Surgeon, Dr. Iran Planas on 07/31/2017 for possible reconstruction if she decides to go with a mastectomy. She has had increased fatigue due to her decreased iron levels. She has chills, but denies fever. She denies rigors. She is still working at this time and notes that she has the option to work from home, which she is considering. For exercise, she is only working at this time. She denies unusual headaches, visual changes, nausea, vomiting, or dizziness. There has been no unusual cough, phlegm production, or pleurisy. This been no change in bowel or bladder habits. She denies unexplained fatigue or unexplained weight loss, bleeding, rash, or fever. A detailed review of systems was otherwise stable.     HISTORY OF CURRENT ILLNESS:  From the original intake note:  Jennifer Beck palpated a mass in her right breast sometime in September 2018.[name] She brought this to medical  attention and her PCP, [name], set her up on 03/18/2017 for a unilateral right diagnostic mammography with ultrasonography, showing: Breast density D. The palpable right breast mass at 12:30 was indeterminate, and biopsy was warranted at that time. No evidence of right axillary lymphadenopathy was noted. Biopsy of the lesion in question on 03/21/2017 at the right breast 12:30 position showed ([id number])  invasive ductal carcinoma with lymphovascular invasion.  The tumor was grade 3, triple negative, with an MIB-1 of 80%.  She is accompanied by her husband, Jennifer Beck to the office today. She reports that she is doing well overall. She initially felt a lump in September in the shower and notes that she doesn't complete monthly self breast exams. She had a routine mammogram in March 2018 at [facility] and she is followed by Dr. [name].  As far as surgeries, she has had two laparoscopic procedures for endometriosis as well as a bilateral tubal ligation. She still has occasional cramping, painful ovulation, and vaginal spotting that comes and goes. She has had cyst to her breast before that have been evaluated and ruled as negative. She notes that she still has her tonsils, gallbladder, and appendix. She has a prior hx of pericarditis in 2003 that she notes was brought onby a virus. She has since been cleared by her prior Cardiologist.   She has a Restaurant manager, fast food, [name] that she sees every 5 weeks for maintenance of her neck and back issues. She has a dermatologist, [name] at [facility] and she has had an biopsy of an area from her left chest that resulted as basal cell carcinoma in 2015. She denies having a GI specialist, Cardiologist,  or Orthopedist at this time. She denies prior history of seizures, migraines, acid reflux, asthma, emphysema, palpitations or GI issues.    [name] is as detailed below.    PAST MEDICAL  HISTORY: Past Medical History:  Diagnosis Date  . Chest pain   . Complication of anesthesia   . Endometriosis   . Family history of breast cancer 04/22/2017  . Family history of colon cancer 04/22/2017  . Family history of kidney cancer 04/22/2017  . Fatigue   . GERD (gastroesophageal reflux disease)   . GI bleed    caused by ischemic colitis following an episode of hypertension  . Heart palpitations   . Hx of echocardiogram    a. echo 9/13:  EF 55-60%, Gr 2 diast dysfn  . Hypercholesterolemia   . Ischemic colitis ([place])   . 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 Myocarditis ([place])   . Pericarditis   . PONV (postoperative nausea and vomiting)   . SOB (shortness of breath)     PAST SURGICAL HISTORY: Past Surgical History:  Procedure Laterality Date  . CARDIAC CATHETERIZATION  08/26/2001   EF of 60% --- smooth & normal coronary arteries -- there is a Hoehn motion defect consistent with posterolateral MI -- she quite possibly had a coronary spasm -- she may have some pericarditis secondary to her MI   . DILATION AND CURETTAGE OF UTERUS  10/15/2003   Uterine enlargement, menorrhagia  . DILATION AND CURETTAGE OF UTERUS  05/27/2002   Dysfunctional uterine bleeding  . LAPAROSCOPY    . NOVASURE ABLATION  10/15/2003   for management of her extreme menorrhagi  . PORTACATH PLACEMENT Right 04/02/2017   Procedure: INSERTION PORT-A-CATH;  Surgeon: [name], MD;  Location: [facility];  Service: General;  Laterality: Right;  . TUBAL LIGATION  09/2000   bilateral    FAMILY HISTORY Family History  Problem Relation Age of Onset  . Hypertension Father   . Diabetes Father   . Other Father        stomach mass suspicious for ca, never bx  . Coronary artery disease Mother   . Kidney cancer Mother 53  . Coronary artery disease Maternal Grandfather   . Colon cancer Maternal Grandfather 61       recurrence  . Coronary artery disease  Maternal Uncle   . Coronary artery disease Maternal Uncle   . Diabetes Maternal Uncle   . Colon cancer Maternal Grandmother 64  . Breast cancer Cousin 65       had GT- CHEK2+  . Cervical cancer Maternal Aunt 35  . Diabetes Maternal Aunt   . Alzheimer's disease Paternal Grandmother   . Colon cancer Paternal Grandfather 41  . Breast cancer Paternal Aunt   . Breast cancer Paternal Aunt   . Breast cancer Other 17   Her father died last 2016/02/16 at age 16 just 22 week shy of his 83rd birthday. Her mother is 50 years old as of October 2018. Patient has one brother and no sisters. Her mother was diagnosed with kidney cancer at age 70 with a nephrectomy following. Her maternal grandmother was diagnosed with colon cancer at age 21.  The patient paternal grandfather was diagnosed with colon cancer in his 89's. She has paternal cousins through her fathers half-sisters that have been diagnosed with breast cancer, she is unsure of the number of breast cancer occurrences due to the distance . A  maternal cousin was diagnosed with breast cancer at age 30 and she had genetic testing. The patient does have a copy of this testing as well as her PCP.  This was not available for review on the nasal  GYNECOLOGIC HISTORY:  No LMP recorded. Patient has had an ablation.   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 Her husband Jennifer Beck is a Airline pilot. Her daughter Jennifer Beck is 57 and currently a sophomore at [employer]. 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  The patient attends [employer]. She lives in [place].      ADVANCED DIRECTIVES:    HEALTH MAINTENANCE: Social History   Tobacco Use  . Smoking status:  Never Smoker  . Smokeless tobacco: Never Used  Substance Use Topics  . Alcohol use: Yes    Comment: Occasionally drinks wine  . Drug use: No     Colonoscopy: Yes, 2003  PAP: March 2018   Bone density: N/A   Allergies  Allergen Reactions  . Codeine Itching  . Biaxin [Clarithromycin]     Heart Burn    Current Outpatient Medications  Medication Sig Dispense Refill  . acetaminophen (TYLENOL) 325 MG tablet Take 650 mg by mouth every 6 (six) hours as needed.    . ALPRAZolam (XANAX) 0.25 MG tablet Take 0.25 mg by mouth daily as needed for anxiety.    . Cholecalciferol (VITAMIN D) 2000 units CAPS Take 2,000 Units by mouth daily.    . Diphenhyd-Hydrocort-Nystatin (FIRST-DUKES MOUTHWASH) SUSP 5-10 ml qid prn swish swallow or spit 240 mL 3  . Evening Primrose Oil 1000 MG CAPS Take 1 capsule by mouth every evening.     . famotidine (PEPCID) 10 MG tablet Take 10 mg by mouth 2 (two) times daily.    . fluocinonide cream (LIDEX) 2.67 % Apply 1 application topically 2 (two) times daily as needed.     [name] glucosamine-chondroitin 500-400 MG tablet Take 1 tablet by mouth daily.     [name] ibuprofen (ADVIL,MOTRIN) 200 MG tablet Take 400 mg by mouth every 8 (eight) hours as needed for headache or mild pain.     . Melatonin 5 MG TABS Take 5 mg by mouth at bedtime as needed (sleep).     . Multiple Minerals-Vitamins (CALCIUM-MAGNESIUM-ZINC) TABS Take 1 tablet by mouth daily.     . Multiple Vitamins-Minerals (MULTIVITAMINS THER. W/MINERALS) TABS Take 1 tablet by mouth daily.     . Omega-3 Fatty Acids (FISH OIL) 1000 MG CAPS Take 1,000 mg by mouth 2 (two) times a week.     [name] omeprazole (PRILOSEC) 40 MG capsule Take 1 capsule (40 mg total) by mouth at bedtime. 30 capsule 0  . ondansetron (ZOFRAN) 8 MG tablet Take 1 tablet (8 mg total) by mouth every 8 (eight) hours as needed for nausea or vomiting. 20 tablet 3  . Polyethyl Glycol-Propyl Glycol (SYSTANE OP) Apply 1 drop to eye 2 (two) times daily.    [name] VITAMIN E PO  Take 1 tablet by mouth daily.      No current facility-administered medications for this visit.    Facility-Administered Medications Ordered in Other Visits  Medication Dose Route Frequency Provider Last Rate Last  Dose  . sodium chloride flush (NS) 0.9 % injection 10 mL  10 mL Intracatheter PRN Kirbi Farrugia, Virgie Dad, MD   10 mL at 07/04/17 1749    OBJECTIVE: Middle-aged white woman in no acute distress  Vitals:   07/25/17 1436  BP: 130/78  Pulse: 100  Resp: 18  Temp: 98.3 F (36.8 C)  SpO2: 100%     Body mass index is 26.53 kg/m.   Wt Readings from Last 3 Encounters:  07/25/17 159 lb 6.4 oz (72.3 kg)  07/11/17 161 lb 3.2 oz (73.1 kg)  06/27/17 163 lb (73.9 kg)      ECOG FS:1  Sclerae unicteric, EOMs intact Oropharynx clear and moist No cervical or supraclavicular adenopathy Lungs no rales or rhonchi Heart regular rate and rhythm Abd soft, nontender, positive bowel sounds MSK no focal spinal tenderness, no upper extremity lymphedema Neuro: nonfocal, well oriented, appropriate affect Breasts: I do not palpate a mass in the upper inner quadrant of the right breast which is where the mass previously was.  There are no skin or nipple changes of concern.  The left breast is benign.  Both axillae are benign.  Skin: nails show remarkable Beau's lines (06/27/2017)    LAB RESULTS:  CMP     Component Value Date/Time   NA 141 07/25/2017 1408   NA 137 05/30/2017 1219   K 3.8 07/25/2017 1408   K 3.8 05/30/2017 1219   CL 106 07/25/2017 1408   CO2 26 07/25/2017 1408   CO2 25 05/30/2017 1219   GLUCOSE 104 07/25/2017 1408   GLUCOSE 122 05/30/2017 1219   BUN 7 07/25/2017 1408   BUN 8.9 05/30/2017 1219   CREATININE 0.68 07/25/2017 1408   CREATININE 0.7 05/30/2017 1219   CALCIUM 9.1 07/25/2017 1408   CALCIUM 9.4 05/30/2017 1219   PROT 6.5 07/25/2017 1408   PROT 6.4 05/30/2017 1219   ALBUMIN 3.8 07/25/2017 1408   ALBUMIN 3.7 05/30/2017 1219   AST 21 07/25/2017 1408   AST 12  05/30/2017 1219   ALT 27 07/25/2017 1408   ALT 14 05/30/2017 1219   ALKPHOS 72 07/25/2017 1408   ALKPHOS 137 05/30/2017 1219   BILITOT 0.3 07/25/2017 1408   BILITOT 0.38 05/30/2017 1219   GFRNONAA >60 07/25/2017 1408   GFRAA >60 07/25/2017 1408    No results found for: TOTALPROTELP, ALBUMINELP, A1GS, A2GS, BETS, BETA2SER, GAMS, MSPIKE, SPEI  No results found for: KPAFRELGTCHN, LAMBDASER, KAPLAMBRATIO  Lab Results  Component Value Date   WBC 2.6 (L) 07/25/2017   NEUTROABS 1.3 (L) 07/25/2017   HGB 9.6 (L) 07/25/2017   HCT 27.6 (L) 07/25/2017   MCV 101.5 (H) 07/25/2017   PLT 265 07/25/2017    '@LASTCHEMISTRY' @  No results found for: LABCA2  No components found for: [id number]  No results for input(s): INR in the last 168 hours.  No results found for: LABCA2  No results found for: WEX937  No results found for: JIR678  No results found for: LFY101  No results found for: CA2729  No components found for: HGQUANT  No results found for: CEA1 / No results found for: CEA1   No results found for: AFPTUMOR  No results found for: CHROMOGRNA  No results found for: PSA1  Appointment on 07/25/2017  Component Date Value Ref Range Status  . Sodium 07/25/2017 141  136 - 145 mmol/L Final  . Potassium 07/25/2017 3.8  3.5 - 5.1 mmol/L Final  . Chloride 07/25/2017 106  98 - 109 mmol/L Final  .  CO2 07/25/2017 26  22 - 29 mmol/L Final  . Glucose, Bld 07/25/2017 104  70 - 140 mg/dL Final  . BUN 07/25/2017 7  7 - 26 mg/dL Final  . Creatinine, Ser 07/25/2017 0.68  0.60 - 1.10 mg/dL Final  . Calcium 07/25/2017 9.1  8.4 - 10.4 mg/dL Final  . Total Protein 07/25/2017 6.5  6.4 - 8.3 g/dL Final  . Albumin 07/25/2017 3.8  3.5 - 5.0 g/dL Final  . AST 07/25/2017 21  5 - 34 U/L Final  . ALT 07/25/2017 27  0 - 55 U/L Final  . Alkaline Phosphatase 07/25/2017 72  40 - 150 U/L Final  . Total Bilirubin 07/25/2017 0.3  0.2 - 1.2 mg/dL Final  . GFR calc non Af Amer 07/25/2017 >60  >60 mL/min  Final  . GFR calc Af Amer 07/25/2017 >60  >60 mL/min Final   Comment: (NOTE) The eGFR has been calculated using the CKD EPI equation. This calculation has not been validated in all clinical situations. eGFR's persistently <60 mL/min signify possible Chronic Kidney Disease.   Georgiann Hahn gap 07/25/2017 9  3 - 11 Final   Performed at [facility], [place] [address], [place], [place] [address]  . WBC 07/25/2017 2.6* 3.9 - 10.3 K/uL Final  . RBC 07/25/2017 2.72* 3.70 - 5.45 MIL/uL Final  . Hemoglobin 07/25/2017 9.6* 11.6 - 15.9 g/dL Final  . HCT 07/25/2017 27.6* 34.8 - 46.6 % Final  . MCV 07/25/2017 101.5* 79.5 - 101.0 fL Final  . MCH 07/25/2017 35.5* 25.1 - 34.0 pg Final  . MCHC 07/25/2017 35.0  31.5 - 36.0 g/dL Final  . RDW 07/25/2017 16.4* 11.2 - 14.5 % Final  . Platelets 07/25/2017 265  145 - 400 K/uL Final  . Neutrophils Relative % 07/25/2017 47  % Final  . Neutro Abs 07/25/2017 1.3* 1.5 - 6.5 K/uL Final  . Lymphocytes Relative 07/25/2017 34  % Final  . Lymphs Abs 07/25/2017 0.9  0.9 - 3.3 K/uL Final  . Monocytes Relative 07/25/2017 14  % Final  . Monocytes Absolute 07/25/2017 0.4  0.1 - 0.9 K/uL Final  . Eosinophils Relative 07/25/2017 4  % Final  . Eosinophils Absolute 07/25/2017 0.1  0.0 - 0.5 K/uL Final  . Basophils Relative 07/25/2017 1  % Final  . Basophils Absolute 07/25/2017 0.0  0.0 - 0.1 K/uL Final   Performed at [facility], [place] [address], [place], [place] [address]    (this displays the last labs from the last 3 days)  No results found for: TOTALPROTELP, ALBUMINELP, A1GS, A2GS, BETS, BETA2SER, GAMS, MSPIKE, SPEI (this displays SPEP labs)  No results found for: KPAFRELGTCHN, LAMBDASER, KAPLAMBRATIO (kappa/lambda light chains)  No results found for: HGBA, HGBA2QUANT, HGBFQUANT, HGBSQUAN (Hemoglobinopathy evaluation)   No results found for: LDH  No results found for: IRON, TIBC, IRONPCTSAT (Iron and TIBC)  No  results found for: FERRITIN  Urinalysis No results found for: COLORURINE, APPEARANCEUR, LABSPEC, PHURINE, GLUCOSEU, HGBUR, BILIRUBINUR, KETONESUR, PROTEINUR, UROBILINOGEN, NITRITE, LEUKOCYTESUR   STUDIES: No results found.  ELIGIBLE FOR AVAILABLE RESEARCH PROTOCOL:UPBEAT trial --patient refused  ASSESSMENT: 52 y.o. [place] woman status post right breast upper inner quadrant biopsy March 21, 2017 for a clinical T2 N0  Stage IIB invasive ductal carcinoma, grade 3, triple negative, with an MIB-1 of 80%.  (a) biopsy of 2 additional suspicious areas in the right breast and one in the left breast scheduled for April 11, 2017  (1) neoadjuvant chemotherapy  consisting of doxorubicin and cyclophosphamide in dose dense fashion x4 completed 05/23/2017, followed by paclitaxel/carboplatin weekly x12, first dose 06/06/2017  (2) definitive surgery to follow  (3) adjuvant radiation as appropriate  (4) genetics testing 04/22/2017 through the common hereditary cancer panel plus renal/urinary tract cancel panel showed no deleterious mutations in APC, ATM, AXIN2, BAP1, BARD1, BMPR1A, BRCA1, BRCA2, BRIP1, BUB1B, CDC73, CDH1, CDK4, CDKN1C, CDKN2A (p14ARF), CDKN2A (p16INK4a), CEP57, CHEK2, CTNNA1, DICER1, DIS3L2, EPCAM*, FH, FLCN, GPC3, GREM1*, KIT, MEN1, MET, MLH1,MSH2, MSH3, MSH6, MUTYH, NBN, NF1, PALB2, PDGFRA, PMS2, POLD1, POLE, PTEN, RAD50, RAD51C, RAD51D, SDHB,SDHC, SDHD, SMAD4, SMARCA4, SMARCB1, STK11, TP53, TSC1, TSC2, VHL, WT1.The following genes were evaluated for sequence changes only: HOXB13*, MITF*, NTHL1*, SDHA  (a) there was a Variant of Uncertain Significance identified in POLD1, namely c.2953C>T (p.Arg985Trp)  PLAN: Tela is beginning to show more side effects and her counts continue to drop.  I think it is safe to treat her today but I am going to not treat her next week to give her marrow and her a little time to recover.  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  I wrote her a note saying that to see if she can arrange to work out of her home.  She is now beginning to consider her surgical options.  She is very interested in bilateral mastectomies.  We discussed the possibility of nipple sparing mastectomies with implant reconstruction, as well as many other options..  She will discussed that further with Dr. Lucia Gaskins and Dr. Iran Planas over the next 2 weeks.  She is having a little bit of change sensation at the very tips of the fingers, not the pads of the fingers.  It is not a tingling it is not a numbness it is just a slight change.  We will continue to follow that closely  She knows to call for any other issues that may develop before her next visit.    Timberlee Roblero, Virgie Dad, MD  07/25/17 2:53 PM Medical Oncology and Hematology [facility] [address] [place], [place] [address] Tel. [phone]    Fax. [phone]  This document serves as a record of services personally performed by Chauncey Cruel, MD. It was created on his behalf by Steva Colder, a trained medical scribe. The creation of this record is based on the scribe's personal observations and the provider's statements to them.  I have reviewed the above documentation for accuracy and completeness, and I agree with the above.

## 2017-07-25 ENCOUNTER — Inpatient Hospital Stay: Payer: 59

## 2017-07-25 ENCOUNTER — Telehealth: Payer: Self-pay | Admitting: Oncology

## 2017-07-25 ENCOUNTER — Inpatient Hospital Stay (HOSPITAL_BASED_OUTPATIENT_CLINIC_OR_DEPARTMENT_OTHER): Payer: 59 | Admitting: Oncology

## 2017-07-25 VITALS — BP 130/78 | HR 100 | Temp 98.3°F | Resp 18 | Ht 65.0 in | Wt 159.4 lb

## 2017-07-25 DIAGNOSIS — Z800 Family history of malignant neoplasm of digestive organs: Secondary | ICD-10-CM

## 2017-07-25 DIAGNOSIS — R530 Neoplastic (malignant) related fatigue: Secondary | ICD-10-CM

## 2017-07-25 DIAGNOSIS — C50411 Malignant neoplasm of upper-outer quadrant of right female breast: Secondary | ICD-10-CM | POA: Diagnosis not present

## 2017-07-25 DIAGNOSIS — Z803 Family history of malignant neoplasm of breast: Secondary | ICD-10-CM | POA: Diagnosis not present

## 2017-07-25 DIAGNOSIS — Z171 Estrogen receptor negative status [ER-]: Principal | ICD-10-CM

## 2017-07-25 DIAGNOSIS — Z808 Family history of malignant neoplasm of other organs or systems: Secondary | ICD-10-CM | POA: Diagnosis not present

## 2017-07-25 DIAGNOSIS — Z79899 Other long term (current) drug therapy: Secondary | ICD-10-CM | POA: Diagnosis not present

## 2017-07-25 DIAGNOSIS — Z5111 Encounter for antineoplastic chemotherapy: Secondary | ICD-10-CM | POA: Diagnosis not present

## 2017-07-25 DIAGNOSIS — Z8051 Family history of malignant neoplasm of kidney: Secondary | ICD-10-CM

## 2017-07-25 LAB — CBC WITH DIFFERENTIAL/PLATELET
Basophils Absolute: 0 10*3/uL (ref 0.0–0.1)
Basophils Relative: 1 %
Eosinophils Absolute: 0.1 10*3/uL (ref 0.0–0.5)
Eosinophils Relative: 4 %
HEMATOCRIT: 27.6 % — AB (ref 34.8–46.6)
Hemoglobin: 9.6 g/dL — ABNORMAL LOW (ref 11.6–15.9)
LYMPHS ABS: 0.9 10*3/uL (ref 0.9–3.3)
LYMPHS PCT: 34 %
MCH: 35.5 pg — AB (ref 25.1–34.0)
MCHC: 35 g/dL (ref 31.5–36.0)
MCV: 101.5 fL — ABNORMAL HIGH (ref 79.5–101.0)
MONOS PCT: 14 %
Monocytes Absolute: 0.4 10*3/uL (ref 0.1–0.9)
NEUTROS ABS: 1.3 10*3/uL — AB (ref 1.5–6.5)
NEUTROS PCT: 47 %
Platelets: 265 10*3/uL (ref 145–400)
RBC: 2.72 MIL/uL — ABNORMAL LOW (ref 3.70–5.45)
RDW: 16.4 % — ABNORMAL HIGH (ref 11.2–14.5)
WBC: 2.6 10*3/uL — ABNORMAL LOW (ref 3.9–10.3)

## 2017-07-25 LAB — COMPREHENSIVE METABOLIC PANEL
ALBUMIN: 3.8 g/dL (ref 3.5–5.0)
ALT: 27 U/L (ref 0–55)
AST: 21 U/L (ref 5–34)
Alkaline Phosphatase: 72 U/L (ref 40–150)
Anion gap: 9 (ref 3–11)
BILIRUBIN TOTAL: 0.3 mg/dL (ref 0.2–1.2)
BUN: 7 mg/dL (ref 7–26)
CHLORIDE: 106 mmol/L (ref 98–109)
CO2: 26 mmol/L (ref 22–29)
Calcium: 9.1 mg/dL (ref 8.4–10.4)
Creatinine, Ser: 0.68 mg/dL (ref 0.60–1.10)
GFR calc Af Amer: >60 mL/min (ref >60)
GFR calc non Af Amer: >60 mL/min (ref >60)
GLUCOSE: 104 mg/dL (ref 70–140)
POTASSIUM: 3.8 mmol/L (ref 3.5–5.1)
Sodium: 141 mmol/L (ref 136–145)
TOTAL PROTEIN: 6.5 g/dL (ref 6.4–8.3)

## 2017-07-25 MED ORDER — SODIUM CHLORIDE 0.9 % IV SOLN
80.0000 mg/m2 | Freq: Once | INTRAVENOUS | Status: AC
  Administered 2017-07-25: 150 mg via INTRAVENOUS
  Filled 2017-07-25: qty 25

## 2017-07-25 MED ORDER — SODIUM CHLORIDE 0.9 % IV SOLN
Freq: Once | INTRAVENOUS | Status: AC
  Administered 2017-07-25: 16:00:00 via INTRAVENOUS

## 2017-07-25 MED ORDER — LORAZEPAM 0.5 MG PO TABS: 0.5000 mg | ORAL_TABLET | Freq: Every evening | ORAL | 1 refills | Status: DC | PRN

## 2017-07-25 MED ORDER — SODIUM CHLORIDE 0.9 % IV SOLN
249.2000 mg | Freq: Once | INTRAVENOUS | Status: AC
  Administered 2017-07-25: 250 mg via INTRAVENOUS
  Filled 2017-07-25: qty 25

## 2017-07-25 MED ORDER — FAMOTIDINE IN NACL 20-0.9 MG/50ML-% IV SOLN: 20.0000 mg | Freq: Once | INTRAVENOUS | Status: DC

## 2017-07-25 MED ORDER — SODIUM CHLORIDE 0.9% FLUSH
10.0000 mL | INTRAVENOUS | Status: DC | PRN
  Administered 2017-07-25: 10 mL
  Filled 2017-07-25: qty 10

## 2017-07-25 MED ORDER — DIPHENHYDRAMINE HCL 50 MG/ML IJ SOLN
INTRAMUSCULAR | Status: AC
  Filled 2017-07-25: qty 1

## 2017-07-25 MED ORDER — DEXAMETHASONE SODIUM PHOSPHATE 10 MG/ML IJ SOLN
INTRAMUSCULAR | Status: AC
  Filled 2017-07-25: qty 1

## 2017-07-25 MED ORDER — SODIUM CHLORIDE 0.9 % IV SOLN
25.0000 mg | Freq: Once | INTRAVENOUS | Status: AC
  Administered 2017-07-25: 25 mg via INTRAVENOUS
  Filled 2017-07-25: qty 0.5

## 2017-07-25 MED ORDER — PALONOSETRON HCL INJECTION 0.25 MG/5ML
0.2500 mg | Freq: Once | INTRAVENOUS | Status: AC
  Administered 2017-07-25: 0.25 mg via INTRAVENOUS

## 2017-07-25 MED ORDER — SODIUM CHLORIDE 0.9 % IV SOLN
20.0000 mg | Freq: Once | INTRAVENOUS | Status: AC
  Administered 2017-07-25: 20 mg via INTRAVENOUS
  Filled 2017-07-25: qty 2

## 2017-07-25 MED ORDER — PALONOSETRON HCL INJECTION 0.25 MG/5ML
INTRAVENOUS | Status: AC
  Filled 2017-07-25: qty 5

## 2017-07-25 MED ORDER — DEXAMETHASONE SODIUM PHOSPHATE 10 MG/ML IJ SOLN
4.0000 mg | Freq: Once | INTRAMUSCULAR | Status: AC
  Administered 2017-07-25: 4 mg via INTRAVENOUS

## 2017-07-25 MED ORDER — HEPARIN SOD (PORK) LOCK FLUSH 100 UNIT/ML IV SOLN
500.0000 [IU] | Freq: Once | INTRAVENOUS | Status: AC | PRN
  Administered 2017-07-25: 500 [IU]
  Filled 2017-07-25: qty 5

## 2017-07-25 MED ORDER — SODIUM CHLORIDE 0.9% FLUSH
10.0000 mL | Freq: Once | INTRAVENOUS | Status: AC
  Administered 2017-07-25: 10 mL
  Filled 2017-07-25: qty 10

## 2017-07-25 NOTE — Telephone Encounter (Signed)
Gave patient AVs and calendar of upcoming march appointments.

## 2017-07-25 NOTE — Progress Notes (Signed)
Okay to treat with ANC of 1.3 per Dr. Jana Hakim.

## 2017-07-25 NOTE — Patient Instructions (Signed)
[facility] Discharge Instructions for Patients Receiving Chemotherapy  Today you received the following chemotherapy agents Taxol and carboplatin  To help prevent nausea and vomiting after your treatment, we encourage you to take your nausea medication as directed   If you develop nausea and vomiting that is not controlled by your nausea medication, call the clinic.   BELOW ARE SYMPTOMS THAT SHOULD BE REPORTED IMMEDIATELY:  *FEVER GREATER THAN 100.5 F  *CHILLS WITH OR WITHOUT FEVER  NAUSEA AND VOMITING THAT IS NOT CONTROLLED WITH YOUR NAUSEA MEDICATION  *UNUSUAL SHORTNESS OF BREATH  *UNUSUAL BRUISING OR BLEEDING  TENDERNESS IN MOUTH AND THROAT WITH OR WITHOUT PRESENCE OF ULCERS  *URINARY PROBLEMS  *BOWEL PROBLEMS  UNUSUAL RASH Items with * indicate a potential emergency and should be followed up as soon as possible.  Feel free to call the clinic should you have any questions or concerns. The clinic phone number is [phone].  Please show the CHEMO ALERT CARD at check-in to the Emergency Department and triage nurse.   

## 2017-07-31 DIAGNOSIS — Z171 Estrogen receptor negative status [ER-]: Secondary | ICD-10-CM | POA: Diagnosis not present

## 2017-07-31 DIAGNOSIS — C50411 Malignant neoplasm of upper-outer quadrant of right female breast: Secondary | ICD-10-CM | POA: Diagnosis not present

## 2017-08-01 ENCOUNTER — Other Ambulatory Visit: Payer: 59

## 2017-08-01 ENCOUNTER — Ambulatory Visit: Payer: 59

## 2017-08-06 NOTE — Progress Notes (Signed)
[facility]  [phone] [phone]     ID: Jennifer Beck DOB: 1966-02-18  MR#: [id number]  JYN#:[id number]  Patient Care Team: [name], MD as PCP - General (Internal Medicine) [name], MD as Consulting Physician (Dermatology) [name], DC as Referring Physician (Chiropractic Medicine) [name], MD as Consulting Physician (General Surgery) [name], MD as Attending Physician (Radiation Oncology) [name], MD as Consulting Physician (Obstetrics and Gynecology) OTHER MD:  CHIEF COMPLAINT: Triple negative breast cancer  CURRENT TREATMENT: Neoadjuvant chemotherapy  INTERVAL HISTORY: Jennifer Beck returns today for follow-up and treatment of her estrogen receptor negative breast cancer. She is currently receiving carboplatin and paclitaxel weekly. She did not receive cycle 9 on 08/01/2017, so she will receive this today. She generally tolerates this treatment well. Since taking a week off of treatment, she feels back to normal. She denies having nausea, oral sores, or peripheral neuropathy. She has tightness in her fingers. She also has sensitivity in her back that feels like she is wearing extra clothes. Her appetite is good, but sense of taste is still altered.      REVIEW OF SYSTEMS:  Jennifer Beck reports that she has stayed home over the last 2 weeks. She has not gone out in public because she is trying to help her immune system and prevent sickness. She climbs that stairs for exercise, and she continues to work at home. She denies unusual headaches, visual changes, nausea, vomiting, or dizziness. There has been no unusual cough, phlegm production, or pleurisy. This been no change in bowel or bladder habits. She denies unexplained fatigue or unexplained weight loss, bleeding, rash, or fever. A detailed review of systems was otherwise stable.    HISTORY OF CURRENT ILLNESS:  From the original intake  note:  Jennifer Beck palpated a mass in her right breast sometime in September 2018.[name] She brought this to medical attention and her PCP, [name], set her up on 03/18/2017 for a unilateral right diagnostic mammography with ultrasonography, showing: Breast density D. The palpable right breast mass at 12:30 was indeterminate, and biopsy was warranted at that time. No evidence of right axillary lymphadenopathy was noted. Biopsy of the lesion in question on 03/21/2017 at the right breast 12:30 position showed ([id number])  invasive ductal carcinoma with lymphovascular invasion.  The tumor was grade 3, triple negative, with an MIB-1 of 80%.  She is accompanied by her husband, [name] to the office today. She reports that she is doing well overall. She initially felt a lump in September in the shower and notes that she doesn't complete monthly self breast exams. She had a routine mammogram in March 2018 at [facility] and she is followed by Dr. [name].  As far as surgeries, she has had two laparoscopic procedures for endometriosis as well as a bilateral tubal ligation. She still has occasional cramping, painful ovulation, and vaginal spotting that comes and goes. She has had cyst to her breast before that have been evaluated and ruled as negative. She notes that she still has her tonsils, gallbladder, and appendix. She has a prior hx of pericarditis in 2003 that she notes was brought onby a virus. She has since been cleared by her prior Cardiologist.   She has a Restaurant manager, fast food, [name] that she sees every 5 weeks for maintenance of her neck and back issues. She has a dermatologist, [name] at [facility] and she has had an biopsy of an area from her  left chest that resulted as basal cell carcinoma in 2015. She denies having a GI specialist, Cardiologist, or Orthopedist at this time. She denies prior history of seizures, migraines, acid reflux, asthma, emphysema,  palpitations or GI issues.    [name] is as detailed below.    PAST MEDICAL HISTORY: Past Medical History:  Diagnosis Date  . Chest pain   . Complication of anesthesia   . Endometriosis   . Family history of breast cancer 04/22/2017  . Family history of colon cancer 04/22/2017  . Family history of kidney cancer 04/22/2017  . Fatigue   . GERD (gastroesophageal reflux disease)   . GI bleed    caused by ischemic colitis following an episode of hypertension  . Heart palpitations   . Hx of echocardiogram    a. echo 03/01/23:  EF 55-60%, Gr 2 diast dysfn  . Hypercholesterolemia   . Ischemic colitis ([place])   . 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 Myocarditis ([place])   . Pericarditis   . PONV (postoperative nausea and vomiting)   . SOB (shortness of breath)     PAST SURGICAL HISTORY: Past Surgical History:  Procedure Laterality Date  . CARDIAC CATHETERIZATION  08/26/2001   EF of 60% --- smooth & normal coronary arteries -- there is a [place] motion defect consistent with posterolateral MI -- she quite possibly had a coronary spasm -- she may have some pericarditis secondary to her MI   . DILATION AND CURETTAGE OF UTERUS  10/15/2003   Uterine enlargement, menorrhagia  . DILATION AND CURETTAGE OF UTERUS  05/27/2002   Dysfunctional uterine bleeding  . LAPAROSCOPY    . NOVASURE ABLATION  10/15/2003   for management of her extreme menorrhagi  . PORTACATH PLACEMENT Right 04/02/2017   Procedure: INSERTION PORT-A-CATH;  Surgeon: [name], MD;  Location: [facility];  Service: General;  Laterality: Right;  . TUBAL LIGATION  09/2000   bilateral    FAMILY HISTORY Family History  Problem Relation Age of Onset  . Hypertension Father   . Diabetes Father   . Other Father        stomach mass suspicious for ca, never bx  . Coronary artery disease Mother   . Kidney cancer Mother 31  . Coronary artery disease  Maternal Grandfather   . Colon cancer Maternal Grandfather 61       recurrence  . Coronary artery disease Maternal Uncle   . Coronary artery disease Maternal Uncle   . Diabetes Maternal Uncle   . Colon cancer Maternal Grandmother 11  . Breast cancer Cousin 31       had GT- CHEK2+  . Cervical cancer Maternal Aunt 35  . Diabetes Maternal Aunt   . Alzheimer's disease Paternal Grandmother   . Colon cancer Paternal Grandfather 2  . Breast cancer Paternal Aunt   . Breast cancer Paternal Aunt   . Breast cancer Other 69   Her father died last Feb 29, 2016 at age 29 just 38 week shy of his 83rd birthday. Her mother is 13 years old as of October 2018. Patient has one brother and no sisters. Her mother was diagnosed with kidney cancer at age 70 with a nephrectomy following. Her maternal grandmother was diagnosed with colon cancer at age 39.  The patient paternal grandfather was diagnosed with colon cancer in his 40's. She has paternal cousins through her fathers half-sisters that have been diagnosed with breast  cancer, she is unsure of the number of breast cancer occurrences due to the distance . A maternal cousin was diagnosed with breast cancer at age 29 and she had genetic testing. The patient does have a copy of this testing as well as her PCP.  This was not available for review on the nasal  GYNECOLOGIC HISTORY:  No LMP recorded. Patient has had an ablation.   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 Her husband [name] is a Airline pilot. Her daughter [name] is 32 and currently a sophomore at [employer]. 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  The patient attends [employer]. She lives in [place].       ADVANCED DIRECTIVES:    HEALTH MAINTENANCE: Social History   Tobacco Use  . Smoking status: Never Smoker  . Smokeless tobacco: Never Used  Substance Use Topics  . Alcohol use: Yes    Comment: Occasionally drinks wine  . Drug use: No     Colonoscopy: Yes, 2003  PAP: March 2018   Bone density: N/A   Allergies  Allergen Reactions  . Codeine Itching  . Biaxin [Clarithromycin]     Heart Burn    Current Outpatient Medications  Medication Sig Dispense Refill  . acetaminophen (TYLENOL) 325 MG tablet Take 650 mg by mouth every 6 (six) hours as needed.    . Cholecalciferol (VITAMIN D) 2000 units CAPS Take 2,000 Units by mouth daily.    . Diphenhyd-Hydrocort-Nystatin (FIRST-DUKES MOUTHWASH) SUSP 5-10 ml qid prn swish swallow or spit 240 mL 3  . famotidine (PEPCID) 10 MG tablet Take 10 mg by mouth 2 (two) times daily.    [name] glucosamine-chondroitin 500-400 MG tablet Take 1 tablet by mouth daily.     [name] ibuprofen (ADVIL,MOTRIN) 200 MG tablet Take 400 mg by mouth every 8 (eight) hours as needed for headache or mild pain.     [name] LORazepam (ATIVAN) 0.5 MG tablet Take 1 tablet (0.5 mg total) by mouth at bedtime as needed for anxiety. 30 tablet 1  . Multiple Minerals-Vitamins (CALCIUM-MAGNESIUM-ZINC) TABS Take 1 tablet by mouth daily.     . Multiple Vitamins-Minerals (MULTIVITAMINS THER. W/MINERALS) TABS Take 1 tablet by mouth daily.     [name] omeprazole (PRILOSEC) 40 MG capsule Take 1 capsule (40 mg total) by mouth at bedtime. 30 capsule 0  . ondansetron (ZOFRAN) 8 MG tablet Take 1 tablet (8 mg total) by mouth every 8 (eight) hours as needed for nausea or vomiting. 20 tablet 3  . Polyethyl Glycol-Propyl Glycol (SYSTANE OP) Apply 1 drop to eye 2 (two) times daily.    [name] VITAMIN E PO Take 1 tablet by mouth daily.      No current facility-administered medications for this visit.    Facility-Administered Medications Ordered in Other Visits  Medication Dose Route Frequency Provider Last Rate  Last Dose  . sodium chloride flush (NS) 0.9 % injection 10 mL  10 mL Intracatheter PRN Magrinat, Virgie Dad, MD   10 mL at 07/04/17 1749    OBJECTIVE: Middle-aged white woman who appears well  Vitals:   08/08/17 1400  BP: 129/66  Pulse: (!) 103  Resp: 18  Temp: 98.5 F (  36.9 C)  SpO2: 100%     Body mass index is 26.58 kg/m.   Wt Readings from Last 3 Encounters:  08/08/17 159 lb 11.2 oz (72.4 kg)  07/25/17 159 lb 6.4 oz (72.3 kg)  07/11/17 161 lb 3.2 oz (73.1 kg)      ECOG FS:1  Sclerae unicteric, pupils round and equal Oropharynx clear and moist No cervical or supraclavicular adenopathy Lungs no rales or rhonchi Heart regular rate and rhythm Abd soft, nontender, positive bowel sounds MSK no focal spinal tenderness, no upper extremity lymphedema Neuro: nonfocal, well oriented, appropriate affect Breasts: Deferred  Skin: nails show remarkable Beau's lines (06/27/2017)    LAB RESULTS:  CMP     Component Value Date/Time   NA 140 08/08/2017 1330   NA 137 05/30/2017 1219   K 3.6 08/08/2017 1330   K 3.8 05/30/2017 1219   CL 107 08/08/2017 1330   CO2 26 08/08/2017 1330   CO2 25 05/30/2017 1219   GLUCOSE 117 08/08/2017 1330   GLUCOSE 122 05/30/2017 1219   BUN 7 08/08/2017 1330   BUN 8.9 05/30/2017 1219   CREATININE 0.71 08/08/2017 1330   CREATININE 0.7 05/30/2017 1219   CALCIUM 9.4 08/08/2017 1330   CALCIUM 9.4 05/30/2017 1219   PROT 6.7 08/08/2017 1330   PROT 6.4 05/30/2017 1219   ALBUMIN 3.8 08/08/2017 1330   ALBUMIN 3.7 05/30/2017 1219   AST 23 08/08/2017 1330   AST 12 05/30/2017 1219   ALT 31 08/08/2017 1330   ALT 14 05/30/2017 1219   ALKPHOS 78 08/08/2017 1330   ALKPHOS 137 05/30/2017 1219   BILITOT 0.3 08/08/2017 1330   BILITOT 0.38 05/30/2017 1219   GFRNONAA >60 08/08/2017 1330   GFRAA >60 08/08/2017 1330    No results found for: TOTALPROTELP, ALBUMINELP, A1GS, A2GS, BETS, BETA2SER, GAMS, MSPIKE, SPEI  No results found for: KPAFRELGTCHN,  LAMBDASER, KAPLAMBRATIO  Lab Results  Component Value Date   WBC 2.6 (L) 08/08/2017   NEUTROABS 1.2 (L) 08/08/2017   HGB 10.4 (L) 08/08/2017   HCT 29.7 (L) 08/08/2017   MCV 104.0 (H) 08/08/2017   PLT 333 08/08/2017    _0 @  No results found for: LABCA2  No components found for: ZOXWRU045  No results for input(s): INR in the last 168 hours.  No results found for: LABCA2  No results found for: WUJ811  No results found for: BJY782  No results found for: NFA213  No results found for: CA2729  No components found for: HGQUANT  No results found for: CEA1 / No results found for: CEA1   No results found for: AFPTUMOR  No results found for: CHROMOGRNA  No results found for: PSA1  Appointment on 08/08/2017  Component Date Value Ref Range Status  . WBC 08/08/2017 2.6* 3.9 - 10.3 K/uL Final  . RBC 08/08/2017 2.86* 3.70 - 5.45 MIL/uL Final  . Hemoglobin 08/08/2017 10.4* 11.6 - 15.9 g/dL Final  . HCT 08/08/2017 29.7* 34.8 - 46.6 % Final  . MCV 08/08/2017 104.0* 79.5 - 101.0 fL Final  . MCH 08/08/2017 36.4* 25.1 - 34.0 pg Final  . MCHC 08/08/2017 35.0  31.5 - 36.0 g/dL Final  . RDW 08/08/2017 17.1* 11.2 - 14.5 % Final  . Platelets 08/08/2017 333  145 - 400 K/uL Final  . Neutrophils Relative % 08/08/2017 44  % Final  . Neutro Abs 08/08/2017 1.2* 1.5 - 6.5 K/uL Final  . Lymphocytes Relative 08/08/2017 33  % Final  . Lymphs Abs 08/08/2017 0.9  0.9 - 3.3 K/uL Final  . Monocytes Relative 08/08/2017 17  % Final  . Monocytes Absolute 08/08/2017 0.4  0.1 - 0.9 K/uL Final  . Eosinophils Relative 08/08/2017 5  % Final  . Eosinophils Absolute 08/08/2017 0.1  0.0 - 0.5 K/uL Final  . Basophils Relative 08/08/2017 1  % Final  . Basophils Absolute 08/08/2017 0.0  0.0 - 0.1 K/uL Final   Performed at [facility], [place] [address] [place], [place] [address]  . Sodium 08/08/2017 140  136 - 145 mmol/L Final  . Potassium 08/08/2017 3.6  3.5 - 5.1 mmol/L  Final  . Chloride 08/08/2017 107  98 - 109 mmol/L Final  . CO2 08/08/2017 26  22 - 29 mmol/L Final  . Glucose, Bld 08/08/2017 117  70 - 140 mg/dL Final  . BUN 08/08/2017 7  7 - 26 mg/dL Final  . Creatinine, Ser 08/08/2017 0.71  0.60 - 1.10 mg/dL Final  . Calcium 08/08/2017 9.4  8.4 - 10.4 mg/dL Final  . Total Protein 08/08/2017 6.7  6.4 - 8.3 g/dL Final  . Albumin 08/08/2017 3.8  3.5 - 5.0 g/dL Final  . AST 08/08/2017 23  5 - 34 U/L Final  . ALT 08/08/2017 31  0 - 55 U/L Final  . Alkaline Phosphatase 08/08/2017 78  40 - 150 U/L Final  . Total Bilirubin 08/08/2017 0.3  0.2 - 1.2 mg/dL Final  . GFR calc non Af Amer 08/08/2017 >60  >60 mL/min Final  . GFR calc Af Amer 08/08/2017 >60  >60 mL/min Final   Comment: (NOTE) The eGFR has been calculated using the CKD EPI equation. This calculation has not been validated in all clinical situations. eGFR's persistently <60 mL/min signify possible Chronic Kidney Disease.   Georgiann Hahn gap 08/08/2017 7  3 - 11 Final   Performed at [facility], [place] [address], [place], [place] [address]    (this displays the last labs from the last 3 days)  No results found for: TOTALPROTELP, ALBUMINELP, A1GS, A2GS, BETS, BETA2SER, GAMS, MSPIKE, SPEI (this displays SPEP labs)  No results found for: KPAFRELGTCHN, LAMBDASER, KAPLAMBRATIO (kappa/lambda light chains)  No results found for: HGBA, HGBA2QUANT, HGBFQUANT, HGBSQUAN (Hemoglobinopathy evaluation)   No results found for: LDH  No results found for: IRON, TIBC, IRONPCTSAT (Iron and TIBC)  No results found for: FERRITIN  Urinalysis No results found for: COLORURINE, APPEARANCEUR, LABSPEC, PHURINE, GLUCOSEU, HGBUR, BILIRUBINUR, KETONESUR, PROTEINUR, UROBILINOGEN, NITRITE, LEUKOCYTESUR   STUDIES: No results found.  ELIGIBLE FOR AVAILABLE RESEARCH PROTOCOL:UPBEAT trial --patient refused  ASSESSMENT: 52 y.o. [place] woman status post right breast upper inner quadrant  biopsy March 21, 2017 for a clinical T2 N0  Stage IIB invasive ductal carcinoma, grade 3, triple negative, with an MIB-1 of 80%.  (a) biopsy of 2 additional suspicious areas in the right breast and one in the left breast scheduled for April 11, 2017  (1) neoadjuvant chemotherapy consisting of doxorubicin and cyclophosphamide in dose dense fashion x4 completed 05/23/2017, followed by paclitaxel/carboplatin weekly x12, first dose 06/06/2017  (2) definitive surgery to follow  (3) adjuvant radiation as appropriate  (4) genetics testing 04/22/2017 through the common hereditary cancer panel plus renal/urinary tract cancel panel showed no deleterious mutations in APC, ATM, AXIN2, BAP1, BARD1, BMPR1A, BRCA1, BRCA2, BRIP1, BUB1B, CDC73, CDH1, CDK4, CDKN1C, CDKN2A (p14ARF), CDKN2A (p16INK4a), CEP57, CHEK2, CTNNA1, DICER1, DIS3L2, EPCAM*, FH, FLCN, GPC3, GREM1*, KIT, MEN1, MET, MLH1,MSH2, MSH3, MSH6, MUTYH, NBN, NF1, PALB2, PDGFRA, PMS2, POLD1,  POLE, PTEN, RAD50, RAD51C, RAD51D, SDHB,SDHC, SDHD, SMAD4, SMARCA4, SMARCB1, STK11, TP53, TSC1, TSC2, VHL, WT1.The following genes were evaluated for sequence changes only: HOXB13*, MITF*, NTHL1*, SDHA  (a) there was a Variant of Uncertain Significance identified in POLD1, namely c.2953C>T (p.[id number])  PLAN: Jennifer Beck had to miss treatment last week because her counts were low.  There are really no better today.  We are still going to treat her but I have added Neupogen to be given Monday and Tuesday of next week.  If her counts remain low next week despite the Neupogen then we are done.  In addition she is having a feeling of tightness at the very fingertips, no loss of sensation and no tingling.  This is not constant.  I think it is safe to treat today but we will have to watch this very closely as we do not want her to have permanent neuropathy issues  Accordingly next week might well be the time when we decide that enough is enough and she can move on  towards surgery  In that regard she has already met with plastics and at this point is considering bilateral mastectomies  She does not know if she wants implant reconstruction or not  I have scheduled her for an MRI on the second week in April.  That will help plan the definitive surgery  She knows to call for any other issues that may develop before her next visit here.   Magrinat, Virgie Dad, MD  08/08/17 2:25 PM Medical Oncology and Hematology [facility] [address] [place], [place] [address] Tel. [phone]    Fax. [phone]  This document serves as a record of services personally performed by Lurline Del, MD. It was created on his behalf by Sheron Nightingale, a trained medical scribe. The creation of this record is based on the scribe's personal observations and the provider's statements to them.   I have reviewed the above documentation for accuracy and completeness, and I agree with the above.

## 2017-08-08 ENCOUNTER — Inpatient Hospital Stay: Payer: 59 | Attending: Oncology

## 2017-08-08 ENCOUNTER — Inpatient Hospital Stay: Payer: 59

## 2017-08-08 ENCOUNTER — Telehealth: Payer: Self-pay | Admitting: Adult Health

## 2017-08-08 ENCOUNTER — Inpatient Hospital Stay (HOSPITAL_BASED_OUTPATIENT_CLINIC_OR_DEPARTMENT_OTHER): Payer: 59 | Admitting: Oncology

## 2017-08-08 VITALS — BP 129/66 | HR 103 | Temp 98.5°F | Resp 18 | Ht 65.0 in | Wt 159.7 lb

## 2017-08-08 DIAGNOSIS — Z85828 Personal history of other malignant neoplasm of skin: Secondary | ICD-10-CM | POA: Diagnosis not present

## 2017-08-08 DIAGNOSIS — C50411 Malignant neoplasm of upper-outer quadrant of right female breast: Secondary | ICD-10-CM | POA: Diagnosis not present

## 2017-08-08 DIAGNOSIS — Z79899 Other long term (current) drug therapy: Secondary | ICD-10-CM | POA: Insufficient documentation

## 2017-08-08 DIAGNOSIS — Z5111 Encounter for antineoplastic chemotherapy: Secondary | ICD-10-CM | POA: Diagnosis not present

## 2017-08-08 DIAGNOSIS — Z8051 Family history of malignant neoplasm of kidney: Secondary | ICD-10-CM | POA: Insufficient documentation

## 2017-08-08 DIAGNOSIS — Z5189 Encounter for other specified aftercare: Secondary | ICD-10-CM | POA: Diagnosis present

## 2017-08-08 DIAGNOSIS — G620 Drug-induced polyneuropathy: Secondary | ICD-10-CM | POA: Insufficient documentation

## 2017-08-08 DIAGNOSIS — D701 Agranulocytosis secondary to cancer chemotherapy: Secondary | ICD-10-CM | POA: Insufficient documentation

## 2017-08-08 DIAGNOSIS — Z171 Estrogen receptor negative status [ER-]: Secondary | ICD-10-CM | POA: Insufficient documentation

## 2017-08-08 LAB — CBC WITH DIFFERENTIAL/PLATELET
Basophils Absolute: 0 10*3/uL (ref 0.0–0.1)
Basophils Relative: 1 %
Eosinophils Absolute: 0.1 10*3/uL (ref 0.0–0.5)
Eosinophils Relative: 5 %
HCT: 29.7 % — ABNORMAL LOW (ref 34.8–46.6)
Hemoglobin: 10.4 g/dL — ABNORMAL LOW (ref 11.6–15.9)
Lymphocytes Relative: 33 %
Lymphs Abs: 0.9 10*3/uL (ref 0.9–3.3)
MCH: 36.4 pg — ABNORMAL HIGH (ref 25.1–34.0)
MCHC: 35 g/dL (ref 31.5–36.0)
MCV: 104 fL — ABNORMAL HIGH (ref 79.5–101.0)
Monocytes Absolute: 0.4 10*3/uL (ref 0.1–0.9)
Monocytes Relative: 17 %
Neutro Abs: 1.2 10*3/uL — ABNORMAL LOW (ref 1.5–6.5)
Neutrophils Relative %: 44 %
Platelets: 333 10*3/uL (ref 145–400)
RBC: 2.86 MIL/uL — ABNORMAL LOW (ref 3.70–5.45)
RDW: 17.1 % — ABNORMAL HIGH (ref 11.2–14.5)
WBC: 2.6 10*3/uL — ABNORMAL LOW (ref 3.9–10.3)

## 2017-08-08 LAB — COMPREHENSIVE METABOLIC PANEL
ALT: 31 U/L (ref 0–55)
AST: 23 U/L (ref 5–34)
Albumin: 3.8 g/dL (ref 3.5–5.0)
Alkaline Phosphatase: 78 U/L (ref 40–150)
Anion gap: 7 (ref 3–11)
BUN: 7 mg/dL (ref 7–26)
CO2: 26 mmol/L (ref 22–29)
Calcium: 9.4 mg/dL (ref 8.4–10.4)
Chloride: 107 mmol/L (ref 98–109)
Creatinine, Ser: 0.71 mg/dL (ref 0.60–1.10)
GFR calc Af Amer: >60 mL/min (ref >60)
GFR calc non Af Amer: >60 mL/min (ref >60)
Glucose, Bld: 117 mg/dL (ref 70–140)
Potassium: 3.6 mmol/L (ref 3.5–5.1)
Sodium: 140 mmol/L (ref 136–145)
Total Bilirubin: 0.3 mg/dL (ref 0.2–1.2)
Total Protein: 6.7 g/dL (ref 6.4–8.3)

## 2017-08-08 MED ORDER — FAMOTIDINE IN NACL 20-0.9 MG/50ML-% IV SOLN: 20.0000 mg | Freq: Once | INTRAVENOUS | Status: DC

## 2017-08-08 MED ORDER — SODIUM CHLORIDE 0.9 % IV SOLN
20.0000 mg | Freq: Once | INTRAVENOUS | Status: AC
  Administered 2017-08-08: 20 mg via INTRAVENOUS
  Filled 2017-08-08: qty 2

## 2017-08-08 MED ORDER — HEPARIN SOD (PORK) LOCK FLUSH 100 UNIT/ML IV SOLN
500.0000 [IU] | Freq: Once | INTRAVENOUS | Status: AC | PRN
  Administered 2017-08-08: 500 [IU]
  Filled 2017-08-08: qty 5

## 2017-08-08 MED ORDER — CARBOPLATIN CHEMO INJECTION 450 MG/45ML
249.2000 mg | Freq: Once | INTRAVENOUS | Status: AC
  Administered 2017-08-08: 250 mg via INTRAVENOUS
  Filled 2017-08-08: qty 25

## 2017-08-08 MED ORDER — SODIUM CHLORIDE 0.9 % IV SOLN
Freq: Once | INTRAVENOUS | Status: AC
  Administered 2017-08-08: 15:00:00 via INTRAVENOUS

## 2017-08-08 MED ORDER — PACLITAXEL CHEMO INJECTION 300 MG/50ML
80.0000 mg/m2 | Freq: Once | INTRAVENOUS | Status: AC
  Administered 2017-08-08: 150 mg via INTRAVENOUS
  Filled 2017-08-08: qty 25

## 2017-08-08 MED ORDER — SODIUM CHLORIDE 0.9% FLUSH
10.0000 mL | INTRAVENOUS | Status: DC | PRN
  Administered 2017-08-08: 10 mL
  Filled 2017-08-08: qty 10

## 2017-08-08 MED ORDER — SODIUM CHLORIDE 0.9 % IV SOLN
25.0000 mg | Freq: Once | INTRAVENOUS | Status: AC
  Administered 2017-08-08: 25 mg via INTRAVENOUS
  Filled 2017-08-08: qty 0.5

## 2017-08-08 MED ORDER — DEXAMETHASONE SODIUM PHOSPHATE 10 MG/ML IJ SOLN
4.0000 mg | Freq: Once | INTRAMUSCULAR | Status: AC
  Administered 2017-08-08: 4 mg via INTRAVENOUS

## 2017-08-08 MED ORDER — PALONOSETRON HCL INJECTION 0.25 MG/5ML
0.2500 mg | Freq: Once | INTRAVENOUS | Status: AC
  Administered 2017-08-08: 0.25 mg via INTRAVENOUS

## 2017-08-08 MED ORDER — PALONOSETRON HCL INJECTION 0.25 MG/5ML
INTRAVENOUS | Status: AC
  Filled 2017-08-08: qty 5

## 2017-08-08 MED ORDER — DEXAMETHASONE SODIUM PHOSPHATE 10 MG/ML IJ SOLN
INTRAMUSCULAR | Status: AC
  Filled 2017-08-08: qty 1

## 2017-08-08 MED ORDER — SODIUM CHLORIDE 0.9% FLUSH
10.0000 mL | Freq: Once | INTRAVENOUS | Status: AC
  Administered 2017-08-08: 10 mL
  Filled 2017-08-08: qty 10

## 2017-08-08 NOTE — Patient Instructions (Signed)
[facility] Discharge Instructions for Patients Receiving Chemotherapy  Today you received the following chemotherapy agents Taxol and carboplatin  To help prevent nausea and vomiting after your treatment, we encourage you to take your nausea medication as directed   If you develop nausea and vomiting that is not controlled by your nausea medication, call the clinic.   BELOW ARE SYMPTOMS THAT SHOULD BE REPORTED IMMEDIATELY:  *FEVER GREATER THAN 100.5 F  *CHILLS WITH OR WITHOUT FEVER  NAUSEA AND VOMITING THAT IS NOT CONTROLLED WITH YOUR NAUSEA MEDICATION  *UNUSUAL SHORTNESS OF BREATH  *UNUSUAL BRUISING OR BLEEDING  TENDERNESS IN MOUTH AND THROAT WITH OR WITHOUT PRESENCE OF ULCERS  *URINARY PROBLEMS  *BOWEL PROBLEMS  UNUSUAL RASH Items with * indicate a potential emergency and should be followed up as soon as possible.  Feel free to call the clinic should you have any questions or concerns. The clinic phone number is [phone].  Please show the CHEMO ALERT CARD at check-in to the Emergency Department and triage nurse.   

## 2017-08-08 NOTE — Telephone Encounter (Signed)
Gave avs and calendar ° °

## 2017-08-09 ENCOUNTER — Other Ambulatory Visit: Payer: Self-pay | Admitting: *Deleted

## 2017-08-09 ENCOUNTER — Telehealth: Payer: Self-pay

## 2017-08-09 NOTE — Telephone Encounter (Signed)
Called to see how she is doing after her treatment yesterday. She is doing well. Instructed to call for questions or concerns.

## 2017-08-09 NOTE — Telephone Encounter (Signed)
-----   Message from Murvin Natal, RN sent at 08/08/2017  3:29 PM EDT ----- Regarding: pt of Dr. Alvy Bimler first time chemo follow up call  Pt of Dr. Alvy Bimler first time Doxil follow up call.  Pt tolerated treatment well.

## 2017-08-12 ENCOUNTER — Inpatient Hospital Stay: Payer: 59

## 2017-08-12 VITALS — BP 114/78 | HR 100 | Temp 98.5°F | Resp 20

## 2017-08-12 DIAGNOSIS — C50911 Malignant neoplasm of unspecified site of right female breast: Secondary | ICD-10-CM

## 2017-08-12 DIAGNOSIS — Z5111 Encounter for antineoplastic chemotherapy: Secondary | ICD-10-CM | POA: Diagnosis not present

## 2017-08-12 MED ORDER — TBO-FILGRASTIM 480 MCG/0.8ML ~~LOC~~ SOSY
480.0000 ug | PREFILLED_SYRINGE | Freq: Once | SUBCUTANEOUS | Status: AC
  Administered 2017-08-12: 480 ug via SUBCUTANEOUS

## 2017-08-12 NOTE — Patient Instructions (Signed)
Tbo-Filgrastim injection What is this medicine? TBO-FILGRASTIM (T B O fil GRA stim) is a granulocyte colony-stimulating factor that stimulates the growth of neutrophils, a type of white blood cell important in the body's fight against infection. It is used to reduce the incidence of fever and infection in patients with certain types of cancer who are receiving chemotherapy that affects the bone marrow. This medicine may be used for other purposes; ask your health care provider or pharmacist if you have questions. COMMON BRAND NAME(S): Granix What should I tell my health care provider before I take this medicine? They need to know if you have any of these conditions: -bone scan or tests planned -kidney disease -sickle cell anemia -an unusual or allergic reaction to tbo-filgrastim, filgrastim, pegfilgrastim, other medicines, foods, dyes, or preservatives -pregnant or trying to get pregnant -breast-feeding How should I use this medicine? This medicine is for injection under the skin. If you get this medicine at home, you will be taught how to prepare and give this medicine. Refer to the Instructions for Use that come with your medication packaging. Use exactly as directed. Take your medicine at regular intervals. Do not take your medicine more often than directed. It is important that you put your used needles and syringes in a special sharps container. Do not put them in a trash can. If you do not have a sharps container, call your pharmacist or healthcare provider to get one. Talk to your pediatrician regarding the use of this medicine in children. Special care may be needed. Overdosage: If you think you have taken too much of this medicine contact a poison control center or emergency room at once. NOTE: This medicine is only for you. Do not share this medicine with others. What if I miss a dose? It is important not to miss your dose. Call your doctor or health care professional if you miss a  dose. What may interact with this medicine? This medicine may interact with the following medications: -medicines that may cause a release of neutrophils, such as lithium This list may not describe all possible interactions. Give your health care provider a list of all the medicines, herbs, non-prescription drugs, or dietary supplements you use. Also tell them if you smoke, drink alcohol, or use illegal drugs. Some items may interact with your medicine. What should I watch for while using this medicine? You may need blood work done while you are taking this medicine. What side effects may I notice from receiving this medicine? Side effects that you should report to your doctor or health care professional as soon as possible: -allergic reactions like skin rash, itching or hives, swelling of the face, lips, or tongue -blood in the urine -dark urine -dizziness -fast heartbeat -feeling faint -shortness of breath or breathing problems -signs and symptoms of infection like fever or chills; cough; or sore throat -signs and symptoms of kidney injury like trouble passing urine or change in the amount of urine -stomach or side pain, or pain at the shoulder -sweating -swelling of the legs, ankles, or abdomen -tiredness Side effects that usually do not require medical attention (report to your doctor or health care professional if they continue or are bothersome): -bone pain -headache -muscle pain -vomiting This list may not describe all possible side effects. Call your doctor for medical advice about side effects. You may report side effects to FDA at 1-800-FDA-1088. Where should I keep my medicine? Keep out of the reach of children. Store in a refrigerator between   2 and 8 degrees C (36 and 46 degrees F). Keep in carton to protect from light. Throw away this medicine if it is left out of the refrigerator for more than 5 consecutive days. Throw away any unused medicine after the expiration  date. NOTE: This sheet is a summary. It may not cover all possible information. If you have questions about this medicine, talk to your doctor, pharmacist, or health care provider.  2018 Elsevier/Gold Standard (2015-07-04 19:07:04)  

## 2017-08-13 ENCOUNTER — Inpatient Hospital Stay: Payer: 59

## 2017-08-13 VITALS — BP 123/65 | HR 112 | Temp 98.1°F | Resp 18

## 2017-08-13 DIAGNOSIS — C50411 Malignant neoplasm of upper-outer quadrant of right female breast: Secondary | ICD-10-CM

## 2017-08-13 DIAGNOSIS — Z5111 Encounter for antineoplastic chemotherapy: Secondary | ICD-10-CM | POA: Diagnosis not present

## 2017-08-13 DIAGNOSIS — Z171 Estrogen receptor negative status [ER-]: Principal | ICD-10-CM

## 2017-08-13 MED ORDER — TBO-FILGRASTIM 480 MCG/0.8ML ~~LOC~~ SOSY
480.0000 ug | PREFILLED_SYRINGE | Freq: Once | SUBCUTANEOUS | Status: AC
  Administered 2017-08-13: 480 ug via SUBCUTANEOUS

## 2017-08-13 MED ORDER — TBO-FILGRASTIM 480 MCG/0.8ML ~~LOC~~ SOSY
PREFILLED_SYRINGE | SUBCUTANEOUS | Status: AC
  Filled 2017-08-13: qty 0.8

## 2017-08-13 MED ORDER — FILGRASTIM 480 MCG/0.8ML IJ SOSY: 480.0000 ug | PREFILLED_SYRINGE | Freq: Once | INTRAMUSCULAR | Status: DC

## 2017-08-15 ENCOUNTER — Encounter: Payer: Self-pay | Admitting: Adult Health

## 2017-08-15 ENCOUNTER — Other Ambulatory Visit: Payer: 59

## 2017-08-15 ENCOUNTER — Inpatient Hospital Stay: Payer: 59

## 2017-08-15 ENCOUNTER — Inpatient Hospital Stay (HOSPITAL_BASED_OUTPATIENT_CLINIC_OR_DEPARTMENT_OTHER): Payer: 59 | Admitting: Adult Health

## 2017-08-15 VITALS — BP 134/78 | HR 99 | Temp 98.4°F | Resp 18 | Ht 65.0 in | Wt 161.2 lb

## 2017-08-15 DIAGNOSIS — Z79899 Other long term (current) drug therapy: Secondary | ICD-10-CM | POA: Diagnosis not present

## 2017-08-15 DIAGNOSIS — C50911 Malignant neoplasm of unspecified site of right female breast: Secondary | ICD-10-CM | POA: Diagnosis not present

## 2017-08-15 DIAGNOSIS — C50411 Malignant neoplasm of upper-outer quadrant of right female breast: Secondary | ICD-10-CM

## 2017-08-15 DIAGNOSIS — N6092 Unspecified benign mammary dysplasia of left breast: Secondary | ICD-10-CM | POA: Diagnosis not present

## 2017-08-15 DIAGNOSIS — Z8051 Family history of malignant neoplasm of kidney: Secondary | ICD-10-CM | POA: Diagnosis not present

## 2017-08-15 DIAGNOSIS — Z171 Estrogen receptor negative status [ER-]: Secondary | ICD-10-CM

## 2017-08-15 DIAGNOSIS — Z85828 Personal history of other malignant neoplasm of skin: Secondary | ICD-10-CM

## 2017-08-15 DIAGNOSIS — Z5111 Encounter for antineoplastic chemotherapy: Secondary | ICD-10-CM | POA: Diagnosis not present

## 2017-08-15 DIAGNOSIS — D701 Agranulocytosis secondary to cancer chemotherapy: Secondary | ICD-10-CM

## 2017-08-15 LAB — CBC WITH DIFFERENTIAL/PLATELET
Basophils Absolute: 0 10*3/uL (ref 0.0–0.1)
Basophils Relative: 1 %
Eosinophils Absolute: 0.3 10*3/uL (ref 0.0–0.5)
Eosinophils Relative: 4 %
HEMATOCRIT: 29.3 % — AB (ref 34.8–46.6)
Hemoglobin: 10.2 g/dL — ABNORMAL LOW (ref 11.6–15.9)
Lymphocytes Relative: 21 %
Lymphs Abs: 1.4 10*3/uL (ref 0.9–3.3)
MCH: 36.4 pg — ABNORMAL HIGH (ref 25.1–34.0)
MCHC: 34.9 g/dL (ref 31.5–36.0)
MCV: 104.5 fL — ABNORMAL HIGH (ref 79.5–101.0)
MONO ABS: 0.5 10*3/uL (ref 0.1–0.9)
Monocytes Relative: 7 %
NEUTROS ABS: 4.4 10*3/uL (ref 1.5–6.5)
Neutrophils Relative %: 67 %
PLATELETS: 318 10*3/uL (ref 145–400)
RBC: 2.81 MIL/uL — ABNORMAL LOW (ref 3.70–5.45)
RDW: 16.2 % — AB (ref 11.2–14.5)
WBC: 6.6 10*3/uL (ref 3.9–10.3)

## 2017-08-15 LAB — COMPREHENSIVE METABOLIC PANEL
ALBUMIN: 3.8 g/dL (ref 3.5–5.0)
ALK PHOS: 97 U/L (ref 40–150)
ALT: 29 U/L (ref 0–55)
ANION GAP: 8 (ref 3–11)
AST: 23 U/L (ref 5–34)
BILIRUBIN TOTAL: 0.2 mg/dL (ref 0.2–1.2)
BUN: 7 mg/dL (ref 7–26)
CO2: 26 mmol/L (ref 22–29)
Calcium: 9.4 mg/dL (ref 8.4–10.4)
Chloride: 106 mmol/L (ref 98–109)
Creatinine, Ser: 0.81 mg/dL (ref 0.60–1.10)
GFR calc non Af Amer: >60 mL/min (ref >60)
GLUCOSE: 144 mg/dL — AB (ref 70–140)
Potassium: 3.6 mmol/L (ref 3.5–5.1)
Sodium: 140 mmol/L (ref 136–145)
Total Protein: 6.7 g/dL (ref 6.4–8.3)

## 2017-08-15 MED ORDER — PALONOSETRON HCL INJECTION 0.25 MG/5ML
0.2500 mg | Freq: Once | INTRAVENOUS | Status: AC
  Administered 2017-08-15: 0.25 mg via INTRAVENOUS

## 2017-08-15 MED ORDER — SODIUM CHLORIDE 0.9 % IV SOLN
Freq: Once | INTRAVENOUS | Status: AC
Start: 2017-08-15 — End: 2017-08-15
  Administered 2017-08-15: 15:00:00 via INTRAVENOUS

## 2017-08-15 MED ORDER — HEPARIN SOD (PORK) LOCK FLUSH 100 UNIT/ML IV SOLN
500.0000 [IU] | Freq: Once | INTRAVENOUS | Status: AC | PRN
  Administered 2017-08-15: 500 [IU]
  Filled 2017-08-15: qty 5

## 2017-08-15 MED ORDER — SODIUM CHLORIDE 0.9% FLUSH
10.0000 mL | Freq: Once | INTRAVENOUS | Status: AC
  Administered 2017-08-15: 10 mL
  Filled 2017-08-15: qty 10

## 2017-08-15 MED ORDER — PALONOSETRON HCL INJECTION 0.25 MG/5ML
INTRAVENOUS | Status: AC
  Filled 2017-08-15: qty 5

## 2017-08-15 MED ORDER — SODIUM CHLORIDE 0.9% FLUSH
10.0000 mL | INTRAVENOUS | Status: DC | PRN
  Administered 2017-08-15: 10 mL
  Filled 2017-08-15: qty 10

## 2017-08-15 MED ORDER — SODIUM CHLORIDE 0.9 % IV SOLN
25.0000 mg | Freq: Once | INTRAVENOUS | Status: AC
  Administered 2017-08-15: 25 mg via INTRAVENOUS
  Filled 2017-08-15: qty 0.5

## 2017-08-15 MED ORDER — DEXAMETHASONE SODIUM PHOSPHATE 10 MG/ML IJ SOLN
INTRAMUSCULAR | Status: AC
  Filled 2017-08-15: qty 1

## 2017-08-15 MED ORDER — SODIUM CHLORIDE 0.9 % IV SOLN
246.8000 mg | Freq: Once | INTRAVENOUS | Status: AC
  Administered 2017-08-15: 250 mg via INTRAVENOUS
  Filled 2017-08-15: qty 25

## 2017-08-15 MED ORDER — FAMOTIDINE IN NACL 20-0.9 MG/50ML-% IV SOLN
INTRAVENOUS | Status: AC
  Filled 2017-08-15: qty 50

## 2017-08-15 MED ORDER — SODIUM CHLORIDE 0.9 % IV SOLN
80.0000 mg/m2 | Freq: Once | INTRAVENOUS | Status: AC
  Administered 2017-08-15: 150 mg via INTRAVENOUS
  Filled 2017-08-15: qty 25

## 2017-08-15 MED ORDER — FAMOTIDINE IN NACL 20-0.9 MG/50ML-% IV SOLN
20.0000 mg | Freq: Once | INTRAVENOUS | Status: AC
  Administered 2017-08-15: 20 mg via INTRAVENOUS

## 2017-08-15 MED ORDER — DEXAMETHASONE SODIUM PHOSPHATE 10 MG/ML IJ SOLN
4.0000 mg | Freq: Once | INTRAMUSCULAR | Status: AC
  Administered 2017-08-15: 4 mg via INTRAVENOUS

## 2017-08-15 NOTE — Progress Notes (Signed)
[place]  [phone] [phone]     ID: Jennifer Beck DOB: 1966-03-28  MR#: [id number]  XBD#:[id number]  Patient Care Team: [name], MD as PCP - General (Internal Medicine) [name], MD as Consulting Physician (Dermatology) [name], DC as Referring Physician (Chiropractic Medicine) [name], MD as Consulting Physician (General Surgery) [name], MD as Attending Physician (Radiation Oncology) [name], MD as Consulting Physician (Obstetrics and Gynecology) OTHER MD:  CHIEF COMPLAINT: Triple negative breast cancer  CURRENT TREATMENT: Neoadjuvant chemotherapy  INTERVAL HISTORY: Julio returns today for follow-up and treatment of her estrogen receptor negative breast cancer. She is currently receiving carboplatin and paclitaxel weekly. She is doing well today.  She is tolerating chemotherapy well.  She is due for cycle 10 today    REVIEW OF SYSTEMS:  Halee denies any issues today.  She does have a tightening sensation in her fingers that remains the same as last week.  She denies neuropathy.  She continues with the odd sensation in her back that feels like she has extra clothes on.  She denies any other issues today and a detailed ROS was conducted and was non contributory.     HISTORY OF CURRENT ILLNESS:  From the original intake note:  Jennifer Beck palpated a mass in her right breast sometime in September 2018.[name] She brought this to medical attention and her PCP, [name], set her up on 03/18/2017 for a unilateral right diagnostic mammography with ultrasonography, showing: Breast density D. The palpable right breast mass at 12:30 was indeterminate, and biopsy was warranted at that time. No evidence of right axillary lymphadenopathy was noted. Biopsy of the lesion in question on 03/21/2017 at the right breast 12:30 position showed ([id number])  invasive ductal carcinoma with  lymphovascular invasion.  The tumor was grade 3, triple negative, with an MIB-1 of 80%.  She is accompanied by her husband, [name] to the office today. She reports that she is doing well overall. She initially felt a lump in September in the shower and notes that she doesn't complete monthly self breast exams. She had a routine mammogram in March 2018 at [facility] and she is followed by Dr. [name].  As far as surgeries, she has had two laparoscopic procedures for endometriosis as well as a bilateral tubal ligation. She still has occasional cramping, painful ovulation, and vaginal spotting that comes and goes. She has had cyst to her breast before that have been evaluated and ruled as negative. She notes that she still has her tonsils, gallbladder, and appendix. She has a prior hx of pericarditis in 2003 that she notes was brought onby a virus. She has since been cleared by her prior Cardiologist.   She has a Restaurant manager, fast food, [name] that she sees every 5 weeks for maintenance of her neck and back issues. She has a dermatologist, [name] at [facility] and she has had an biopsy of an area from her left chest that resulted as basal cell carcinoma in 2015. She denies having a GI specialist, Cardiologist, or Orthopedist at this time. She denies prior history of seizures, migraines, acid reflux, asthma, emphysema, palpitations or GI issues.    [name] is as detailed below.    PAST MEDICAL HISTORY: Past Medical History:  Diagnosis Date  . Chest pain   . Complication of anesthesia   . Endometriosis   . Family history of breast cancer 04/22/2017  . Family history  of colon cancer 04/22/2017  . Family history of kidney cancer 04/22/2017  . Fatigue   . GERD (gastroesophageal reflux disease)   . GI bleed    caused by ischemic colitis following an episode of hypertension  . Heart palpitations   . Hx of echocardiogram    a. echo 9/13:  EF  55-60%, Gr 2 diast dysfn  . Hypercholesterolemia   . Ischemic colitis ([place])   . 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 Myocarditis ([place])   . Pericarditis   . PONV (postoperative nausea and vomiting)   . SOB (shortness of breath)     PAST SURGICAL HISTORY: Past Surgical History:  Procedure Laterality Date  . CARDIAC CATHETERIZATION  08/26/2001   EF of 60% --- smooth & normal coronary arteries -- there is a Baca motion defect consistent with posterolateral MI -- she quite possibly had a coronary spasm -- she may have some pericarditis secondary to her MI   . DILATION AND CURETTAGE OF UTERUS  10/15/2003   Uterine enlargement, menorrhagia  . DILATION AND CURETTAGE OF UTERUS  05/27/2002   Dysfunctional uterine bleeding  . LAPAROSCOPY    . NOVASURE ABLATION  10/15/2003   for management of her extreme menorrhagi  . PORTACATH PLACEMENT Right 04/02/2017   Procedure: INSERTION PORT-A-CATH;  Surgeon: [name], MD;  Location: [facility];  Service: General;  Laterality: Right;  . TUBAL LIGATION  09/2000   bilateral    FAMILY HISTORY Family History  Problem Relation Age of Onset  . Hypertension Father   . Diabetes Father   . Other Father        stomach mass suspicious for ca, never bx  . Coronary artery disease Mother   . Kidney cancer Mother 72  . Coronary artery disease Maternal Grandfather   . Colon cancer Maternal Grandfather 61       recurrence  . Coronary artery disease Maternal Uncle   . Coronary artery disease Maternal Uncle   . Diabetes Maternal Uncle   . Colon cancer Maternal Grandmother 105  . Breast cancer Cousin 42       had GT- CHEK2+  . Cervical cancer Maternal Aunt 35  . Diabetes Maternal Aunt   . Alzheimer's disease Paternal Grandmother   . Colon cancer Paternal Grandfather 33  . Breast cancer Paternal Aunt   . Breast cancer Paternal Aunt   . Breast cancer Other 45   Her father died last 2016/02/23 at  age 38 just 63 week shy of his 83rd birthday. Her mother is 69 years old as of October 2018. Patient has one brother and no sisters. Her mother was diagnosed with kidney cancer at age 49 with a nephrectomy following. Her maternal grandmother was diagnosed with colon cancer at age 4.  The patient paternal grandfather was diagnosed with colon cancer in his 40's. She has paternal cousins through her fathers half-sisters that have been diagnosed with breast cancer, she is unsure of the number of breast cancer occurrences due to the distance . A maternal cousin was diagnosed with breast cancer at age 56 and she had genetic testing. The patient does have a copy of this testing as well as her PCP.  This was not available for review on the nasal  GYNECOLOGIC HISTORY:  No LMP recorded. Patient has had an ablation.   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 Her husband [name] is a Airline pilot. Her daughter [name] is 71 and currently a sophomore at [employer]. 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  The patient attends [employer]. She lives in [place].      ADVANCED DIRECTIVES:    HEALTH MAINTENANCE: Social History   Tobacco Use  . Smoking status: Never Smoker  . Smokeless tobacco: Never Used  Substance Use Topics  . Alcohol use: Yes    Comment: Occasionally drinks wine  . Drug use: No     Colonoscopy: Yes, 2003  PAP: March 2018   Bone density: N/A   Allergies  Allergen Reactions  . Codeine Itching  . Biaxin [Clarithromycin]     Heart Burn    Current Outpatient Medications  Medication Sig Dispense Refill  . acetaminophen (TYLENOL) 325 MG tablet Take 650 mg by mouth every 6 (six) hours as  needed.    . Cholecalciferol (VITAMIN D) 2000 units CAPS Take 2,000 Units by mouth daily.    . famotidine (PEPCID) 10 MG tablet Take 10 mg by mouth 2 (two) times daily.    [name] glucosamine-chondroitin 500-400 MG tablet Take 1 tablet by mouth daily.     [name] ibuprofen (ADVIL,MOTRIN) 200 MG tablet Take 400 mg by mouth every 8 (eight) hours as needed for headache or mild pain.     [name] LORazepam (ATIVAN) 0.5 MG tablet Take 1 tablet (0.5 mg total) by mouth at bedtime as needed for anxiety. 30 tablet 1  . Multiple Minerals-Vitamins (CALCIUM-MAGNESIUM-ZINC) TABS Take 1 tablet by mouth daily.     . Multiple Vitamins-Minerals (MULTIVITAMINS THER. W/MINERALS) TABS Take 1 tablet by mouth daily.     [name] omeprazole (PRILOSEC) 40 MG capsule Take 1 capsule (40 mg total) by mouth at bedtime. 30 capsule 0  . ondansetron (ZOFRAN) 8 MG tablet Take 1 tablet (8 mg total) by mouth every 8 (eight) hours as needed for nausea or vomiting. 20 tablet 3  . Polyethyl Glycol-Propyl Glycol (SYSTANE OP) Apply 1 drop to eye 2 (two) times daily.    [name] VITAMIN E PO Take 1 tablet by mouth daily.      No current facility-administered medications for this visit.    Facility-Administered Medications Ordered in Other Visits  Medication Dose Route Frequency Provider Last Rate Last Dose  . sodium chloride flush (NS) 0.9 % injection 10 mL  10 mL Intracatheter PRN Magrinat, Virgie Dad, MD   10 mL at 07/04/17 1749    OBJECTIVE:   Vitals:   08/15/17 1352  BP: 134/78  Pulse: 99  Resp: 18  Temp: 98.4 F (36.9 C)  SpO2: 100%     Body mass index is 26.83 kg/m.   Wt Readings from Last 3 Encounters:  08/15/17 161 lb 3.2 oz (73.1 kg)  08/08/17 159 lb 11.2 oz (72.4 kg)  07/25/17 159 lb 6.4 oz (72.3 kg)   ECOG FS:1 GENERAL: Patient is a well appearing female in no acute distress HEENT:  Sclerae anicteric.  Oropharynx clear and moist. No ulcerations or evidence of oropharyngeal candidiasis. Neck is supple.  NODES:  No cervical, supraclavicular,  or axillary lymphadenopathy palpated.  BREAST EXAM:  Deferred. LUNGS:  Clear to auscultation bilaterally.  No wheezes or rhonchi. HEART:  Regular  rate and rhythm. No murmur appreciated. ABDOMEN:  Soft, nontender.  Positive, normoactive bowel sounds. No organomegaly palpated. MSK:  No focal spinal tenderness to palpation. Full range of motion bilaterally in the upper extremities. EXTREMITIES:  No peripheral edema.   SKIN:  Clear with no obvious rashes or skin changes. No nail dyscrasia. NEURO:  Nonfocal. Well oriented.  Appropriate affect.   LAB RESULTS:  CMP     Component Value Date/Time   NA 140 08/08/2017 1330   NA 137 05/30/2017 1219   K 3.6 08/08/2017 1330   K 3.8 05/30/2017 1219   CL 107 08/08/2017 1330   CO2 26 08/08/2017 1330   CO2 25 05/30/2017 1219   GLUCOSE 117 08/08/2017 1330   GLUCOSE 122 05/30/2017 1219   BUN 7 08/08/2017 1330   BUN 8.9 05/30/2017 1219   CREATININE 0.71 08/08/2017 1330   CREATININE 0.7 05/30/2017 1219   CALCIUM 9.4 08/08/2017 1330   CALCIUM 9.4 05/30/2017 1219   PROT 6.7 08/08/2017 1330   PROT 6.4 05/30/2017 1219   ALBUMIN 3.8 08/08/2017 1330   ALBUMIN 3.7 05/30/2017 1219   AST 23 08/08/2017 1330   AST 12 05/30/2017 1219   ALT 31 08/08/2017 1330   ALT 14 05/30/2017 1219   ALKPHOS 78 08/08/2017 1330   ALKPHOS 137 05/30/2017 1219   BILITOT 0.3 08/08/2017 1330   BILITOT 0.38 05/30/2017 1219   GFRNONAA >60 08/08/2017 1330   GFRAA >60 08/08/2017 1330    No results found for: TOTALPROTELP, ALBUMINELP, A1GS, A2GS, BETS, BETA2SER, GAMS, MSPIKE, SPEI  No results found for: KPAFRELGTCHN, LAMBDASER, KAPLAMBRATIO  Lab Results  Component Value Date   WBC 6.6 08/15/2017   NEUTROABS 4.4 08/15/2017   HGB 10.2 (L) 08/15/2017   HCT 29.3 (L) 08/15/2017   MCV 104.5 (H) 08/15/2017   PLT 318 08/15/2017    '@LASTCHEMISTRY' @  No results found for: LABCA2  No components found for: IHKVQQ595  No results for input(s): INR in the last 168  hours.  No results found for: LABCA2  No results found for: GLO756  No results found for: EPP295  No results found for: JOA416  No results found for: CA2729  No components found for: HGQUANT  No results found for: CEA1 / No results found for: CEA1   No results found for: AFPTUMOR  No results found for: CHROMOGRNA  No results found for: PSA1  Appointment on 08/15/2017  Component Date Value Ref Range Status  . WBC 08/15/2017 6.6  3.9 - 10.3 K/uL Final  . RBC 08/15/2017 2.81* 3.70 - 5.45 MIL/uL Final  . Hemoglobin 08/15/2017 10.2* 11.6 - 15.9 g/dL Final  . HCT 08/15/2017 29.3* 34.8 - 46.6 % Final  . MCV 08/15/2017 104.5* 79.5 - 101.0 fL Final  . MCH 08/15/2017 36.4* 25.1 - 34.0 pg Final  . MCHC 08/15/2017 34.9  31.5 - 36.0 g/dL Final  . RDW 08/15/2017 16.2* 11.2 - 14.5 % Final  . Platelets 08/15/2017 318  145 - 400 K/uL Final  . Neutrophils Relative % 08/15/2017 67  % Final  . Neutro Abs 08/15/2017 4.4  1.5 - 6.5 K/uL Final  . Lymphocytes Relative 08/15/2017 21  % Final  . Lymphs Abs 08/15/2017 1.4  0.9 - 3.3 K/uL Final  . Monocytes Relative 08/15/2017 7  % Final  . Monocytes Absolute 08/15/2017 0.5  0.1 - 0.9 K/uL Final  . Eosinophils Relative 08/15/2017 4  % Final  . Eosinophils Absolute 08/15/2017 0.3  0.0 - 0.5 K/uL Final  .  Basophils Relative 08/15/2017 1  % Final  . Basophils Absolute 08/15/2017 0.0  0.0 - 0.1 K/uL Final   Performed at [facility], [place] [address] [place], [place] [address]    (this displays the last labs from the last 3 days)  No results found for: TOTALPROTELP, ALBUMINELP, A1GS, A2GS, BETS, BETA2SER, GAMS, MSPIKE, SPEI (this displays SPEP labs)  No results found for: KPAFRELGTCHN, LAMBDASER, KAPLAMBRATIO (kappa/lambda light chains)  No results found for: HGBA, HGBA2QUANT, HGBFQUANT, HGBSQUAN (Hemoglobinopathy evaluation)   No results found for: LDH  No results found for: IRON, TIBC, IRONPCTSAT (Iron and  TIBC)  No results found for: FERRITIN  Urinalysis No results found for: COLORURINE, APPEARANCEUR, LABSPEC, PHURINE, GLUCOSEU, HGBUR, BILIRUBINUR, KETONESUR, PROTEINUR, UROBILINOGEN, NITRITE, LEUKOCYTESUR   STUDIES: No results found.  ELIGIBLE FOR AVAILABLE RESEARCH PROTOCOL:UPBEAT trial --patient refused  ASSESSMENT: 52 y.o. [place] woman status post right breast upper inner quadrant biopsy March 21, 2017 for a clinical T2 N0  Stage IIB invasive ductal carcinoma, grade 3, triple negative, with an MIB-1 of 80%.  (a) biopsy of 2 additional suspicious areas in the right breast and one in the left breast scheduled for April 11, 2017  (1) neoadjuvant chemotherapy consisting of doxorubicin and cyclophosphamide in dose dense fashion x4 completed 05/23/2017, followed by paclitaxel/carboplatin weekly x12, first dose 06/06/2017  (2) definitive surgery to follow  (3) adjuvant radiation as appropriate  (4) genetics testing 04/22/2017 through the common hereditary cancer panel plus renal/urinary tract cancel panel showed no deleterious mutations in APC, ATM, AXIN2, BAP1, BARD1, BMPR1A, BRCA1, BRCA2, BRIP1, BUB1B, CDC73, CDH1, CDK4, CDKN1C, CDKN2A (p14ARF), CDKN2A (p16INK4a), CEP57, CHEK2, CTNNA1, DICER1, DIS3L2, EPCAM*, FH, FLCN, GPC3, GREM1*, KIT, MEN1, MET, MLH1,MSH2, MSH3, MSH6, MUTYH, NBN, NF1, PALB2, PDGFRA, PMS2, POLD1, POLE, PTEN, RAD50, RAD51C, RAD51D, SDHB,SDHC, SDHD, SMAD4, SMARCA4, SMARCB1, STK11, TP53, TSC1, TSC2, VHL, WT1.The following genes were evaluated for sequence changes only: HOXB13*, MITF*, NTHL1*, SDHA  (a) there was a Variant of Uncertain Significance identified in POLD1, namely c.2953C>T (p.Arg985Trp)  PLAN:  Tosca is feeling well today.  Her labs remain stable and she will proceed with her next cycle of Paclitaxel and carboplatin.  Her MRI breast hasn't been scheduled yet, so I will touch base with our navigators to ensure that it does.  She will continue with  Granix this week, will monitor WBC closely.  She will return in one week for labs, f/u and her next cycle of chemotherapy.  She knows to call for any questions or concerns prior to her next appointment with [place].  A total of (30) minutes of face-to-face time was spent with this patient with greater than 50% of that time in counseling and care-coordination.   Wilber Bihari, NP  08/15/17 2:00 PM Medical Oncology and Hematology [facility] [address] [place], [place] [address] Tel. [phone]    Fax. [phone]

## 2017-08-15 NOTE — Patient Instructions (Signed)
[facility] Discharge Instructions for Patients Receiving Chemotherapy  Today you received the following chemotherapy agents Taxol, Carboplatin  To help prevent nausea and vomiting after your treatment, we encourage you to take your nausea medication as directed  If you develop nausea and vomiting that is not controlled by your nausea medication, call the clinic.   BELOW ARE SYMPTOMS THAT SHOULD BE REPORTED IMMEDIATELY:  *FEVER GREATER THAN 100.5 F  *CHILLS WITH OR WITHOUT FEVER  NAUSEA AND VOMITING THAT IS NOT CONTROLLED WITH YOUR NAUSEA MEDICATION  *UNUSUAL SHORTNESS OF BREATH  *UNUSUAL BRUISING OR BLEEDING  TENDERNESS IN MOUTH AND THROAT WITH OR WITHOUT PRESENCE OF ULCERS  *URINARY PROBLEMS  *BOWEL PROBLEMS  UNUSUAL RASH Items with * indicate a potential emergency and should be followed up as soon as possible.  Feel free to call the clinic should you have any questions or concerns. The clinic phone number is [phone].  Please show the CHEMO ALERT CARD at check-in to the Emergency Department and triage nurse.   

## 2017-08-16 ENCOUNTER — Other Ambulatory Visit: Payer: Self-pay | Admitting: Oncology

## 2017-08-16 ENCOUNTER — Telehealth: Payer: Self-pay | Admitting: Adult Health

## 2017-08-16 DIAGNOSIS — Z171 Estrogen receptor negative status [ER-]: Principal | ICD-10-CM

## 2017-08-16 DIAGNOSIS — C50411 Malignant neoplasm of upper-outer quadrant of right female breast: Secondary | ICD-10-CM

## 2017-08-16 NOTE — Telephone Encounter (Signed)
Per 3/21 no los °

## 2017-08-19 ENCOUNTER — Inpatient Hospital Stay: Payer: 59

## 2017-08-19 DIAGNOSIS — Z01419 Encounter for gynecological examination (general) (routine) without abnormal findings: Secondary | ICD-10-CM | POA: Diagnosis not present

## 2017-08-19 DIAGNOSIS — Z5111 Encounter for antineoplastic chemotherapy: Secondary | ICD-10-CM | POA: Diagnosis not present

## 2017-08-19 DIAGNOSIS — Z171 Estrogen receptor negative status [ER-]: Principal | ICD-10-CM

## 2017-08-19 DIAGNOSIS — Z6825 Body mass index (BMI) 25.0-25.9, adult: Secondary | ICD-10-CM | POA: Diagnosis not present

## 2017-08-19 DIAGNOSIS — C50411 Malignant neoplasm of upper-outer quadrant of right female breast: Secondary | ICD-10-CM

## 2017-08-19 MED ORDER — TBO-FILGRASTIM 480 MCG/0.8ML ~~LOC~~ SOSY
PREFILLED_SYRINGE | SUBCUTANEOUS | Status: AC
  Filled 2017-08-19: qty 0.8

## 2017-08-19 MED ORDER — TBO-FILGRASTIM 480 MCG/0.8ML ~~LOC~~ SOSY
480.0000 ug | PREFILLED_SYRINGE | Freq: Once | SUBCUTANEOUS | Status: AC
  Administered 2017-08-19: 480 ug via SUBCUTANEOUS

## 2017-08-20 ENCOUNTER — Inpatient Hospital Stay: Payer: 59

## 2017-08-20 VITALS — BP 136/77 | HR 99 | Temp 98.5°F | Resp 20

## 2017-08-20 DIAGNOSIS — Z171 Estrogen receptor negative status [ER-]: Principal | ICD-10-CM

## 2017-08-20 DIAGNOSIS — Z5111 Encounter for antineoplastic chemotherapy: Secondary | ICD-10-CM | POA: Diagnosis not present

## 2017-08-20 DIAGNOSIS — C50411 Malignant neoplasm of upper-outer quadrant of right female breast: Secondary | ICD-10-CM

## 2017-08-20 MED ORDER — TBO-FILGRASTIM 480 MCG/0.8ML ~~LOC~~ SOSY
480.0000 ug | PREFILLED_SYRINGE | Freq: Once | SUBCUTANEOUS | Status: AC
  Administered 2017-08-20: 480 ug via SUBCUTANEOUS

## 2017-08-20 NOTE — Patient Instructions (Signed)
Tbo-Filgrastim injection What is this medicine? TBO-FILGRASTIM (T B O fil GRA stim) is a granulocyte colony-stimulating factor that stimulates the growth of neutrophils, a type of white blood cell important in the body's fight against infection. It is used to reduce the incidence of fever and infection in patients with certain types of cancer who are receiving chemotherapy that affects the bone marrow. This medicine may be used for other purposes; ask your health care provider or pharmacist if you have questions. COMMON BRAND NAME(S): Granix What should I tell my health care provider before I take this medicine? They need to know if you have any of these conditions: -bone scan or tests planned -kidney disease -sickle cell anemia -an unusual or allergic reaction to tbo-filgrastim, filgrastim, pegfilgrastim, other medicines, foods, dyes, or preservatives -pregnant or trying to get pregnant -breast-feeding How should I use this medicine? This medicine is for injection under the skin. If you get this medicine at home, you will be taught how to prepare and give this medicine. Refer to the Instructions for Use that come with your medication packaging. Use exactly as directed. Take your medicine at regular intervals. Do not take your medicine more often than directed. It is important that you put your used needles and syringes in a special sharps container. Do not put them in a trash can. If you do not have a sharps container, call your pharmacist or healthcare provider to get one. Talk to your pediatrician regarding the use of this medicine in children. Special care may be needed. Overdosage: If you think you have taken too much of this medicine contact a poison control center or emergency room at once. NOTE: This medicine is only for you. Do not share this medicine with others. What if I miss a dose? It is important not to miss your dose. Call your doctor or health care professional if you miss a  dose. What may interact with this medicine? This medicine may interact with the following medications: -medicines that may cause a release of neutrophils, such as lithium This list may not describe all possible interactions. Give your health care provider a list of all the medicines, herbs, non-prescription drugs, or dietary supplements you use. Also tell them if you smoke, drink alcohol, or use illegal drugs. Some items may interact with your medicine. What should I watch for while using this medicine? You may need blood work done while you are taking this medicine. What side effects may I notice from receiving this medicine? Side effects that you should report to your doctor or health care professional as soon as possible: -allergic reactions like skin rash, itching or hives, swelling of the face, lips, or tongue -blood in the urine -dark urine -dizziness -fast heartbeat -feeling faint -shortness of breath or breathing problems -signs and symptoms of infection like fever or chills; cough; or sore throat -signs and symptoms of kidney injury like trouble passing urine or change in the amount of urine -stomach or side pain, or pain at the shoulder -sweating -swelling of the legs, ankles, or abdomen -tiredness Side effects that usually do not require medical attention (report to your doctor or health care professional if they continue or are bothersome): -bone pain -headache -muscle pain -vomiting This list may not describe all possible side effects. Call your doctor for medical advice about side effects. You may report side effects to FDA at 1-800-FDA-1088. Where should I keep my medicine? Keep out of the reach of children. Store in a refrigerator between   2 and 8 degrees C (36 and 46 degrees F). Keep in carton to protect from light. Throw away this medicine if it is left out of the refrigerator for more than 5 consecutive days. Throw away any unused medicine after the expiration  date. NOTE: This sheet is a summary. It may not cover all possible information. If you have questions about this medicine, talk to your doctor, pharmacist, or health care provider.  2018 Elsevier/Gold Standard (2015-07-04 19:07:04)  

## 2017-08-21 ENCOUNTER — Ambulatory Visit: Payer: Self-pay | Admitting: Surgery

## 2017-08-21 DIAGNOSIS — C50911 Malignant neoplasm of unspecified site of right female breast: Secondary | ICD-10-CM

## 2017-08-22 ENCOUNTER — Inpatient Hospital Stay: Payer: 59

## 2017-08-22 ENCOUNTER — Telehealth: Payer: Self-pay | Admitting: Adult Health

## 2017-08-22 ENCOUNTER — Encounter: Payer: Self-pay | Admitting: *Deleted

## 2017-08-22 ENCOUNTER — Inpatient Hospital Stay (HOSPITAL_BASED_OUTPATIENT_CLINIC_OR_DEPARTMENT_OTHER): Payer: 59 | Admitting: Adult Health

## 2017-08-22 ENCOUNTER — Encounter: Payer: Self-pay | Admitting: Adult Health

## 2017-08-22 VITALS — BP 135/77 | HR 98 | Temp 98.3°F | Resp 18 | Ht 65.0 in | Wt 159.4 lb

## 2017-08-22 DIAGNOSIS — C50411 Malignant neoplasm of upper-outer quadrant of right female breast: Secondary | ICD-10-CM

## 2017-08-22 DIAGNOSIS — G620 Drug-induced polyneuropathy: Secondary | ICD-10-CM | POA: Diagnosis not present

## 2017-08-22 DIAGNOSIS — D701 Agranulocytosis secondary to cancer chemotherapy: Secondary | ICD-10-CM | POA: Diagnosis not present

## 2017-08-22 DIAGNOSIS — Z171 Estrogen receptor negative status [ER-]: Principal | ICD-10-CM

## 2017-08-22 DIAGNOSIS — Z8051 Family history of malignant neoplasm of kidney: Secondary | ICD-10-CM | POA: Diagnosis not present

## 2017-08-22 DIAGNOSIS — Z79899 Other long term (current) drug therapy: Secondary | ICD-10-CM | POA: Diagnosis not present

## 2017-08-22 DIAGNOSIS — Z5111 Encounter for antineoplastic chemotherapy: Secondary | ICD-10-CM | POA: Diagnosis not present

## 2017-08-22 DIAGNOSIS — Z85828 Personal history of other malignant neoplasm of skin: Secondary | ICD-10-CM

## 2017-08-22 LAB — CBC WITH DIFFERENTIAL/PLATELET
Basophils Absolute: 0 10*3/uL (ref 0.0–0.1)
Basophils Relative: 0 %
EOS ABS: 0.2 10*3/uL (ref 0.0–0.5)
Eosinophils Relative: 3 %
HCT: 28.2 % — ABNORMAL LOW (ref 34.8–46.6)
HEMOGLOBIN: 9.8 g/dL — AB (ref 11.6–15.9)
LYMPHS ABS: 1.2 10*3/uL (ref 0.9–3.3)
Lymphocytes Relative: 24 %
MCH: 36.7 pg — AB (ref 25.1–34.0)
MCHC: 34.8 g/dL (ref 31.5–36.0)
MCV: 105.5 fL — AB (ref 79.5–101.0)
MONOS PCT: 9 %
Monocytes Absolute: 0.4 10*3/uL (ref 0.1–0.9)
NEUTROS PCT: 64 %
Neutro Abs: 3.1 10*3/uL (ref 1.5–6.5)
Platelets: 316 10*3/uL (ref 145–400)
RBC: 2.67 MIL/uL — ABNORMAL LOW (ref 3.70–5.45)
RDW: 15.6 % — ABNORMAL HIGH (ref 11.2–14.5)
WBC: 4.9 10*3/uL (ref 3.9–10.3)

## 2017-08-22 LAB — COMPREHENSIVE METABOLIC PANEL
ALBUMIN: 3.9 g/dL (ref 3.5–5.0)
ALT: 26 U/L (ref 0–55)
AST: 21 U/L (ref 5–34)
Alkaline Phosphatase: 93 U/L (ref 40–150)
Anion gap: 8 (ref 3–11)
BUN: 8 mg/dL (ref 7–26)
CHLORIDE: 108 mmol/L (ref 98–109)
CO2: 24 mmol/L (ref 22–29)
CREATININE: 0.64 mg/dL (ref 0.60–1.10)
Calcium: 9.3 mg/dL (ref 8.4–10.4)
GFR calc Af Amer: >60 mL/min (ref >60)
GFR calc non Af Amer: >60 mL/min (ref >60)
Glucose, Bld: 88 mg/dL (ref 70–140)
Potassium: 3.9 mmol/L (ref 3.5–5.1)
Sodium: 140 mmol/L (ref 136–145)
Total Bilirubin: 0.3 mg/dL (ref 0.2–1.2)
Total Protein: 6.5 g/dL (ref 6.4–8.3)

## 2017-08-22 MED ORDER — SODIUM CHLORIDE 0.9% FLUSH
10.0000 mL | Freq: Once | INTRAVENOUS | Status: AC
  Administered 2017-08-22: 10 mL
  Filled 2017-08-22: qty 10

## 2017-08-22 MED ORDER — FAMOTIDINE IN NACL 20-0.9 MG/50ML-% IV SOLN
INTRAVENOUS | Status: AC
  Filled 2017-08-22: qty 50

## 2017-08-22 MED ORDER — PACLITAXEL CHEMO INJECTION 300 MG/50ML
80.0000 mg/m2 | Freq: Once | INTRAVENOUS | Status: AC
  Administered 2017-08-22: 150 mg via INTRAVENOUS
  Filled 2017-08-22: qty 25

## 2017-08-22 MED ORDER — DIPHENHYDRAMINE HCL 50 MG/ML IJ SOLN
INTRAMUSCULAR | Status: AC
  Filled 2017-08-22: qty 1

## 2017-08-22 MED ORDER — DEXAMETHASONE SODIUM PHOSPHATE 10 MG/ML IJ SOLN
INTRAMUSCULAR | Status: AC
  Filled 2017-08-22: qty 1

## 2017-08-22 MED ORDER — SODIUM CHLORIDE 0.9 % IV SOLN
25.0000 mg | Freq: Once | INTRAVENOUS | Status: AC
  Administered 2017-08-22: 25 mg via INTRAVENOUS
  Filled 2017-08-22: qty 0.5

## 2017-08-22 MED ORDER — SODIUM CHLORIDE 0.9 % IV SOLN
Freq: Once | INTRAVENOUS | Status: AC
  Administered 2017-08-22: 15:00:00 via INTRAVENOUS

## 2017-08-22 MED ORDER — PALONOSETRON HCL INJECTION 0.25 MG/5ML
0.2500 mg | Freq: Once | INTRAVENOUS | Status: AC
  Administered 2017-08-22: 0.25 mg via INTRAVENOUS

## 2017-08-22 MED ORDER — DEXAMETHASONE SODIUM PHOSPHATE 10 MG/ML IJ SOLN
4.0000 mg | Freq: Once | INTRAMUSCULAR | Status: AC
  Administered 2017-08-22: 4 mg via INTRAVENOUS

## 2017-08-22 MED ORDER — PALONOSETRON HCL INJECTION 0.25 MG/5ML
INTRAVENOUS | Status: AC
  Filled 2017-08-22: qty 5

## 2017-08-22 MED ORDER — HEPARIN SOD (PORK) LOCK FLUSH 100 UNIT/ML IV SOLN
500.0000 [IU] | Freq: Once | INTRAVENOUS | Status: AC | PRN
  Administered 2017-08-22: 500 [IU]
  Filled 2017-08-22: qty 5

## 2017-08-22 MED ORDER — SODIUM CHLORIDE 0.9% FLUSH
10.0000 mL | INTRAVENOUS | Status: DC | PRN
  Administered 2017-08-22: 10 mL
  Filled 2017-08-22: qty 10

## 2017-08-22 MED ORDER — FAMOTIDINE IN NACL 20-0.9 MG/50ML-% IV SOLN
20.0000 mg | Freq: Once | INTRAVENOUS | Status: AC
Start: 2017-08-22 — End: 2017-08-22
  Administered 2017-08-22: 20 mg via INTRAVENOUS

## 2017-08-22 MED ORDER — SODIUM CHLORIDE 0.9 % IV SOLN
249.2000 mg | Freq: Once | INTRAVENOUS | Status: AC
  Administered 2017-08-22: 250 mg via INTRAVENOUS
  Filled 2017-08-22: qty 25

## 2017-08-22 NOTE — Progress Notes (Signed)
[place]  [phone] [phone]     ID: Jennifer Beck DOB: [birth year]  MR#: [id number]  DTO#:[id number]  Patient Care Team: [name], MD as PCP - General (Internal Medicine) [name], MD as Consulting Physician (Dermatology) [name], DC as Referring Physician (Chiropractic Medicine) [name], MD as Consulting Physician (General Surgery) [name], MD as Attending Physician (Radiation Oncology) [name], MD as Consulting Physician (Obstetrics and Gynecology) OTHER MD:  CHIEF COMPLAINT: Triple negative breast cancer  CURRENT TREATMENT: Neoadjuvant chemotherapy  INTERVAL HISTORY: Jennifer Beck returns today for follow-up and treatment of her estrogen receptor negative breast cancer. She is currently receiving carboplatin and paclitaxel weekly. She is doing well today.  She is tolerating chemotherapy well.  She is due for cycle 11 today.    REVIEW OF SYSTEMS:  Jennifer Beck continues to tolerate chemotherapy well.  She continues to have mild intermittent peripheral neuropathy in the tips of her fingers, but otherwise she is doing well and is without concerns.  She denies fevers, chills, nausea, vomiting, constipation, diarrhea, headaches, new pain.  She is looking forward to her out of town trip on 4/11 with her husband for their anniversary.  A detailed ROS was otherwise non contributory today.    HISTORY OF CURRENT ILLNESS:  From the original intake note:  Jennifer Beck palpated a mass in her right breast sometime in September 2018.[name] She brought this to medical attention and her PCP, [name], set her up on 03/18/2017 for a unilateral right diagnostic mammography with ultrasonography, showing: Breast density D. The palpable right breast mass at 12:30 was indeterminate, and biopsy was warranted at that time. No evidence of right axillary lymphadenopathy was noted. Biopsy of the lesion in question on  03/21/2017 at the right breast 12:30 position showed ([id number])  invasive ductal carcinoma with lymphovascular invasion.  The tumor was grade 3, triple negative, with an MIB-1 of 80%.  She is accompanied by her husband, Jennifer Beck to the office today. She reports that she is doing well overall. She initially felt a lump in September in the shower and notes that she doesn't complete monthly self breast exams. She had a routine mammogram in March 2018 at [facility] and she is followed by Dr. [name].  As far as surgeries, she has had two laparoscopic procedures for endometriosis as well as a bilateral tubal ligation. She still has occasional cramping, painful ovulation, and vaginal spotting that comes and goes. She has had cyst to her breast before that have been evaluated and ruled as negative. She notes that she still has her tonsils, gallbladder, and appendix. She has a prior hx of pericarditis in 2003 that she notes was brought onby a virus. She has since been cleared by her prior Cardiologist.   She has a Restaurant manager, fast food, [name] that she sees every 5 weeks for maintenance of her neck and back issues. She has a dermatologist, [name] at [facility] and she has had an biopsy of an area from her left chest that resulted as basal cell carcinoma in 2015. She denies having a GI specialist, Cardiologist, or Orthopedist at this time. She denies prior history of seizures, migraines, acid reflux, asthma, emphysema, palpitations or GI issues.    [name] is as detailed below.    PAST MEDICAL HISTORY: Past Medical History:  Diagnosis Date  . Chest pain   . Complication of anesthesia   . Endometriosis   . Family  history of breast cancer 04/22/2017  . Family history of colon cancer 04/22/2017  . Family history of kidney cancer 04/22/2017  . Fatigue   . GERD (gastroesophageal reflux disease)   . GI bleed    caused by ischemic colitis following  an episode of hypertension  . Heart palpitations   . Hx of echocardiogram    a. echo 9/13:  EF 55-60%, Gr 2 diast dysfn  . Hypercholesterolemia   . Ischemic colitis ([place])   . 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 Myocarditis ([place])   . Pericarditis   . PONV (postoperative nausea and vomiting)   . SOB (shortness of breath)     PAST SURGICAL HISTORY: Past Surgical History:  Procedure Laterality Date  . CARDIAC CATHETERIZATION  08/26/2001   EF of 60% --- smooth & normal coronary arteries -- there is a Yannuzzi motion defect consistent with posterolateral MI -- she quite possibly had a coronary spasm -- she may have some pericarditis secondary to her MI   . DILATION AND CURETTAGE OF UTERUS  10/15/2003   Uterine enlargement, menorrhagia  . DILATION AND CURETTAGE OF UTERUS  05/27/2002   Dysfunctional uterine bleeding  . LAPAROSCOPY    . NOVASURE ABLATION  10/15/2003   for management of her extreme menorrhagi  . PORTACATH PLACEMENT Right 04/02/2017   Procedure: INSERTION PORT-A-CATH;  Surgeon: [name], MD;  Location: [facility];  Service: General;  Laterality: Right;  . TUBAL LIGATION  09/2000   bilateral    FAMILY HISTORY Family History  Problem Relation Age of Onset  . Hypertension Father   . Diabetes Father   . Other Father        stomach mass suspicious for ca, never bx  . Coronary artery disease Mother   . Kidney cancer Mother 46  . Coronary artery disease Maternal Grandfather   . Colon cancer Maternal Grandfather 61       recurrence  . Coronary artery disease Maternal Uncle   . Coronary artery disease Maternal Uncle   . Diabetes Maternal Uncle   . Colon cancer Maternal Grandmother 76  . Breast cancer Cousin 29       had GT- CHEK2+  . Cervical cancer Maternal Aunt 35  . Diabetes Maternal Aunt   . Alzheimer's disease Paternal Grandmother   . Colon cancer Paternal Grandfather 37  . Breast cancer Paternal Aunt   .  Breast cancer Paternal Aunt   . Breast cancer Other 70   Her father died last 2016-02-28 at age 56 just 50 week shy of his 83rd birthday. Her mother is 51 years old as of October 2018. Patient has one brother and no sisters. Her mother was diagnosed with kidney cancer at age 67 with a nephrectomy following. Her maternal grandmother was diagnosed with colon cancer at age 5.  The patient paternal grandfather was diagnosed with colon cancer in his 92's. She has paternal cousins through her fathers half-sisters that have been diagnosed with breast cancer, she is unsure of the number of breast cancer occurrences due to the distance . A maternal cousin was diagnosed with breast cancer at age 57 and she had genetic testing. The patient does have a copy of this testing as well as her PCP.  This was not available for review on the nasal  GYNECOLOGIC HISTORY:  No LMP recorded. Patient has had an ablation.   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 Her husband Jennifer Beck is a Airline pilot. Her daughter Jennifer Beck is 29 and currently a sophomore at [employer]. 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  The patient attends [employer]. She lives in [place].      ADVANCED DIRECTIVES:    HEALTH MAINTENANCE: Social History   Tobacco Use  . Smoking status: Never Smoker  . Smokeless tobacco: Never Used  Substance Use Topics  . Alcohol use: Yes    Comment: Occasionally drinks wine  . Drug use: No     Colonoscopy: Yes, 2003  PAP: March 2018   Bone density: N/A   Allergies  Allergen Reactions  . Codeine Itching  . Biaxin [Clarithromycin]     Heart Burn    Current Outpatient Medications  Medication Sig  Dispense Refill  . acetaminophen (TYLENOL) 325 MG tablet Take 650 mg by mouth every 6 (six) hours as needed.    . Cholecalciferol (VITAMIN D) 2000 units CAPS Take 2,000 Units by mouth daily.    . famotidine (PEPCID) 10 MG tablet Take 10 mg by mouth 2 (two) times daily.    [name] glucosamine-chondroitin 500-400 MG tablet Take 1 tablet by mouth daily.     [name] ibuprofen (ADVIL,MOTRIN) 200 MG tablet Take 400 mg by mouth every 8 (eight) hours as needed for headache or mild pain.     [name] LORazepam (ATIVAN) 0.5 MG tablet Take 1 tablet (0.5 mg total) by mouth at bedtime as needed for anxiety. 30 tablet 1  . Multiple Minerals-Vitamins (CALCIUM-MAGNESIUM-ZINC) TABS Take 1 tablet by mouth daily.     . Multiple Vitamins-Minerals (MULTIVITAMINS THER. W/MINERALS) TABS Take 1 tablet by mouth daily.     [name] omeprazole (PRILOSEC) 40 MG capsule TAKE 1 CAPSULE BY MOUTH AT BEDTIME 30 capsule 0  . ondansetron (ZOFRAN) 8 MG tablet Take 1 tablet (8 mg total) by mouth every 8 (eight) hours as needed for nausea or vomiting. 20 tablet 3  . Polyethyl Glycol-Propyl Glycol (SYSTANE OP) Apply 1 drop to eye 2 (two) times daily.    [name] VITAMIN E PO Take 1 tablet by mouth daily.      No current facility-administered medications for this visit.    Facility-Administered Medications Ordered in Other Visits  Medication Dose Route Frequency Provider Last Rate Last Dose  . sodium chloride flush (NS) 0.9 % injection 10 mL  10 mL Intracatheter PRN Magrinat, Virgie Dad, MD   10 mL at 07/04/17 1749    OBJECTIVE:   Vitals:   08/22/17 1255  BP: 135/77  Pulse: 98  Resp: 18  Temp: 98.3 F (36.8 C)  SpO2: 100%     Body mass index is 26.53 kg/m.   Wt Readings from Last 3 Encounters:  08/22/17 159 lb 6.4 oz (72.3 kg)  08/15/17 161 lb 3.2 oz (73.1 kg)  08/08/17 159 lb 11.2 oz (72.4 kg)   ECOG FS:1 GENERAL: Patient is a well appearing female in no acute distress HEENT:  Sclerae anicteric.  Oropharynx clear and moist. No ulcerations or  evidence of oropharyngeal candidiasis. Neck is supple.  NODES:  No cervical, supraclavicular, or axillary lymphadenopathy palpated.  BREAST EXAM:  Deferred. LUNGS:  Clear to auscultation bilaterally.  No  wheezes or rhonchi. HEART:  Regular rate and rhythm. No murmur appreciated. ABDOMEN:  Soft, nontender.  Positive, normoactive bowel sounds. No organomegaly palpated. MSK:  No focal spinal tenderness to palpation. Full range of motion bilaterally in the upper extremities. EXTREMITIES:  No peripheral edema.   SKIN:  Clear with no obvious rashes or skin changes. Fingernails appear brittle NEURO:  Nonfocal. Well oriented.  Appropriate affect.   LAB RESULTS:  CMP     Component Value Date/Time   NA 140 08/22/2017 1227   NA 137 05/30/2017 1219   K 3.9 08/22/2017 1227   K 3.8 05/30/2017 1219   CL 108 08/22/2017 1227   CO2 24 08/22/2017 1227   CO2 25 05/30/2017 1219   GLUCOSE 88 08/22/2017 1227   GLUCOSE 122 05/30/2017 1219   BUN 8 08/22/2017 1227   BUN 8.9 05/30/2017 1219   CREATININE 0.64 08/22/2017 1227   CREATININE 0.7 05/30/2017 1219   CALCIUM 9.3 08/22/2017 1227   CALCIUM 9.4 05/30/2017 1219   PROT 6.5 08/22/2017 1227   PROT 6.4 05/30/2017 1219   ALBUMIN 3.9 08/22/2017 1227   ALBUMIN 3.7 05/30/2017 1219   AST 21 08/22/2017 1227   AST 12 05/30/2017 1219   ALT 26 08/22/2017 1227   ALT 14 05/30/2017 1219   ALKPHOS 93 08/22/2017 1227   ALKPHOS 137 05/30/2017 1219   BILITOT 0.3 08/22/2017 1227   BILITOT 0.38 05/30/2017 1219   GFRNONAA >60 08/22/2017 1227   GFRAA >60 08/22/2017 1227    No results found for: TOTALPROTELP, ALBUMINELP, A1GS, A2GS, BETS, BETA2SER, GAMS, MSPIKE, SPEI  No results found for: KPAFRELGTCHN, LAMBDASER, KAPLAMBRATIO  Lab Results  Component Value Date   WBC 4.9 08/22/2017   NEUTROABS 3.1 08/22/2017   HGB 9.8 (L) 08/22/2017   HCT 28.2 (L) 08/22/2017   MCV 105.5 (H) 08/22/2017   PLT 316 08/22/2017    '@LASTCHEMISTRY' @  No results found for:  LABCA2  No components found for: TFTDDU202  No results for input(s): INR in the last 168 hours.  No results found for: LABCA2  No results found for: RKY706  No results found for: CBJ628  No results found for: BTD176  No results found for: CA2729  No components found for: HGQUANT  No results found for: CEA1 / No results found for: CEA1   No results found for: AFPTUMOR  No results found for: CHROMOGRNA  No results found for: PSA1  Appointment on 08/22/2017  Component Date Value Ref Range Status  . Sodium 08/22/2017 140  136 - 145 mmol/L Final  . Potassium 08/22/2017 3.9  3.5 - 5.1 mmol/L Final  . Chloride 08/22/2017 108  98 - 109 mmol/L Final  . CO2 08/22/2017 24  22 - 29 mmol/L Final  . Glucose, Bld 08/22/2017 88  70 - 140 mg/dL Final  . BUN 08/22/2017 8  7 - 26 mg/dL Final  . Creatinine, Ser 08/22/2017 0.64  0.60 - 1.10 mg/dL Final  . Calcium 08/22/2017 9.3  8.4 - 10.4 mg/dL Final  . Total Protein 08/22/2017 6.5  6.4 - 8.3 g/dL Final  . Albumin 08/22/2017 3.9  3.5 - 5.0 g/dL Final  . AST 08/22/2017 21  5 - 34 U/L Final  . ALT 08/22/2017 26  0 - 55 U/L Final  . Alkaline Phosphatase 08/22/2017 93  40 - 150 U/L Final  . Total Bilirubin 08/22/2017 0.3  0.2 - 1.2 mg/dL Final  . GFR calc non Af Amer 08/22/2017 >60  >60 mL/min Final  .  GFR calc Af Amer 08/22/2017 >60  >60 mL/min Final   Comment: (NOTE) The eGFR has been calculated using the CKD EPI equation. This calculation has not been validated in all clinical situations. eGFR's persistently <60 mL/min signify possible Chronic Kidney Disease.   Jennifer Beck gap 08/22/2017 8  3 - 11 Final   Performed at [facility], [place] [address], [place], [place] [address]  . WBC 08/22/2017 4.9  3.9 - 10.3 K/uL Final  . RBC 08/22/2017 2.67* 3.70 - 5.45 MIL/uL Final  . Hemoglobin 08/22/2017 9.8* 11.6 - 15.9 g/dL Final  . HCT 08/22/2017 28.2* 34.8 - 46.6 % Final  . MCV 08/22/2017 105.5* 79.5 - 101.0 fL Final  .  MCH 08/22/2017 36.7* 25.1 - 34.0 pg Final  . MCHC 08/22/2017 34.8  31.5 - 36.0 g/dL Final  . RDW 08/22/2017 15.6* 11.2 - 14.5 % Final  . Platelets 08/22/2017 316  145 - 400 K/uL Final  . Neutrophils Relative % 08/22/2017 64  % Final  . Neutro Abs 08/22/2017 3.1  1.5 - 6.5 K/uL Final  . Lymphocytes Relative 08/22/2017 24  % Final  . Lymphs Abs 08/22/2017 1.2  0.9 - 3.3 K/uL Final  . Monocytes Relative 08/22/2017 9  % Final  . Monocytes Absolute 08/22/2017 0.4  0.1 - 0.9 K/uL Final  . Eosinophils Relative 08/22/2017 3  % Final  . Eosinophils Absolute 08/22/2017 0.2  0.0 - 0.5 K/uL Final  . Basophils Relative 08/22/2017 0  % Final  . Basophils Absolute 08/22/2017 0.0  0.0 - 0.1 K/uL Final   Performed at [facility], [place] [address], [place], [place] [address]    (this displays the last labs from the last 3 days)  No results found for: TOTALPROTELP, ALBUMINELP, A1GS, A2GS, BETS, BETA2SER, GAMS, MSPIKE, SPEI (this displays SPEP labs)  No results found for: KPAFRELGTCHN, LAMBDASER, KAPLAMBRATIO (kappa/lambda light chains)  No results found for: HGBA, HGBA2QUANT, HGBFQUANT, HGBSQUAN (Hemoglobinopathy evaluation)   No results found for: LDH  No results found for: IRON, TIBC, IRONPCTSAT (Iron and TIBC)  No results found for: FERRITIN  Urinalysis No results found for: COLORURINE, APPEARANCEUR, LABSPEC, PHURINE, GLUCOSEU, HGBUR, BILIRUBINUR, KETONESUR, PROTEINUR, UROBILINOGEN, NITRITE, LEUKOCYTESUR   STUDIES: No results found.  ELIGIBLE FOR AVAILABLE RESEARCH PROTOCOL:UPBEAT trial --patient refused  ASSESSMENT: 52 y.o. Becker woman status post right breast upper inner quadrant biopsy March 21, 2017 for a clinical T2 N0  Stage IIB invasive ductal carcinoma, grade 3, triple negative, with an MIB-1 of 80%.  (a) biopsy of 2 additional suspicious areas in the right breast and one in the left breast scheduled for April 11, 2017  (1) neoadjuvant  chemotherapy consisting of doxorubicin and cyclophosphamide in dose dense fashion x4 completed 05/23/2017, followed by paclitaxel/carboplatin weekly x12, first dose 06/06/2017  (2) definitive surgery to follow  (3) adjuvant radiation as appropriate  (4) genetics testing 04/22/2017 through the common hereditary cancer panel plus renal/urinary tract cancel panel showed no deleterious mutations in APC, ATM, AXIN2, BAP1, BARD1, BMPR1A, BRCA1, BRCA2, BRIP1, BUB1B, CDC73, CDH1, CDK4, CDKN1C, CDKN2A (p14ARF), CDKN2A (p16INK4a), CEP57, CHEK2, CTNNA1, DICER1, DIS3L2, EPCAM*, FH, FLCN, GPC3, GREM1*, KIT, MEN1, MET, MLH1,MSH2, MSH3, MSH6, MUTYH, NBN, NF1, PALB2, PDGFRA, PMS2, POLD1, POLE, PTEN, RAD50, RAD51C, RAD51D, SDHB,SDHC, SDHD, SMAD4, SMARCA4, SMARCB1, STK11, TP53, TSC1, TSC2, VHL, WT1.The following genes were evaluated for sequence changes only: HOXB13*, MITF*, NTHL1*, SDHA  (a) there was a Variant of Uncertain Significance identified in POLD1, namely c.2953C>T (p.Arg985Trp)  PLAN:  Kamia is doing well today.  Her CBC remains stable.  She will proceed with weekly Paclitaxel and Carboplatin (so long as CMET is within parameters).  She will continue to monitor her mild intermittent peripheral neuropathy.  She will undergo MRI on 09/02/17.  We reviewed the possibility of eligibility for SWOG [phone], and that would mean leaving her port in during this surgery. She is anxious to know what her results will be, but knows she has to wait to see what they are, to determine some of her future treatments.    Blanka will return in one week for labs and chemotherapy.  She knows to call for any questions or concerns prior to her next appointment with [place].  A total of (20) minutes of face-to-face time was spent with this patient with greater than 50% of that time in counseling and care-coordination.   Wilber Bihari, NP  08/22/17 1:39 PM Medical Oncology and Hematology [facility] [place], [place] [address] Tel. [phone]    Fax. [phone]

## 2017-08-22 NOTE — Patient Instructions (Signed)
   [facility] Discharge Instructions for Patients Receiving Chemotherapy  Today you received the following chemotherapy agents Taxol and Carboplatin   To help prevent nausea and vomiting after your treatment, we encourage you to take your nausea medication as directed.    If you develop nausea and vomiting that is not controlled by your nausea medication, call the clinic.   BELOW ARE SYMPTOMS THAT SHOULD BE REPORTED IMMEDIATELY:  *FEVER GREATER THAN 100.5 F  *CHILLS WITH OR WITHOUT FEVER  NAUSEA AND VOMITING THAT IS NOT CONTROLLED WITH YOUR NAUSEA MEDICATION  *UNUSUAL SHORTNESS OF BREATH  *UNUSUAL BRUISING OR BLEEDING  TENDERNESS IN MOUTH AND THROAT WITH OR WITHOUT PRESENCE OF ULCERS  *URINARY PROBLEMS  *BOWEL PROBLEMS  UNUSUAL RASH Items with * indicate a potential emergency and should be followed up as soon as possible.  Feel free to call the clinic should you have any questions or concerns. The clinic phone number is [phone].  Please show the CHEMO ALERT CARD at check-in to the Emergency Department and triage nurse.   

## 2017-08-22 NOTE — Telephone Encounter (Signed)
Per 3/28 no los

## 2017-08-23 ENCOUNTER — Encounter: Payer: Self-pay | Admitting: Adult Health

## 2017-08-26 ENCOUNTER — Inpatient Hospital Stay: Payer: 59 | Attending: Oncology

## 2017-08-26 VITALS — BP 127/76 | HR 100 | Temp 98.5°F | Resp 20

## 2017-08-26 DIAGNOSIS — Z8051 Family history of malignant neoplasm of kidney: Secondary | ICD-10-CM | POA: Diagnosis not present

## 2017-08-26 DIAGNOSIS — Z5111 Encounter for antineoplastic chemotherapy: Secondary | ICD-10-CM | POA: Diagnosis not present

## 2017-08-26 DIAGNOSIS — Z800 Family history of malignant neoplasm of digestive organs: Secondary | ICD-10-CM | POA: Insufficient documentation

## 2017-08-26 DIAGNOSIS — C50411 Malignant neoplasm of upper-outer quadrant of right female breast: Secondary | ICD-10-CM | POA: Diagnosis not present

## 2017-08-26 DIAGNOSIS — Z171 Estrogen receptor negative status [ER-]: Secondary | ICD-10-CM | POA: Diagnosis not present

## 2017-08-26 DIAGNOSIS — G2581 Restless legs syndrome: Secondary | ICD-10-CM | POA: Insufficient documentation

## 2017-08-26 DIAGNOSIS — R52 Pain, unspecified: Secondary | ICD-10-CM | POA: Insufficient documentation

## 2017-08-26 DIAGNOSIS — Z803 Family history of malignant neoplasm of breast: Secondary | ICD-10-CM | POA: Insufficient documentation

## 2017-08-26 DIAGNOSIS — M791 Myalgia, unspecified site: Secondary | ICD-10-CM | POA: Diagnosis not present

## 2017-08-26 MED ORDER — TBO-FILGRASTIM 480 MCG/0.8ML ~~LOC~~ SOSY
480.0000 ug | PREFILLED_SYRINGE | Freq: Once | SUBCUTANEOUS | Status: AC
  Administered 2017-08-26: 480 ug via SUBCUTANEOUS

## 2017-08-26 MED ORDER — TBO-FILGRASTIM 480 MCG/0.8ML ~~LOC~~ SOSY
PREFILLED_SYRINGE | SUBCUTANEOUS | Status: AC
  Filled 2017-08-26: qty 0.8

## 2017-08-26 NOTE — Patient Instructions (Signed)
Tbo-Filgrastim injection What is this medicine? TBO-FILGRASTIM (T B O fil GRA stim) is a granulocyte colony-stimulating factor that stimulates the growth of neutrophils, a type of white blood cell important in the body's fight against infection. It is used to reduce the incidence of fever and infection in patients with certain types of cancer who are receiving chemotherapy that affects the bone marrow. This medicine may be used for other purposes; ask your health care provider or pharmacist if you have questions. COMMON BRAND NAME(S): Granix What should I tell my health care provider before I take this medicine? They need to know if you have any of these conditions: -bone scan or tests planned -kidney disease -sickle cell anemia -an unusual or allergic reaction to tbo-filgrastim, filgrastim, pegfilgrastim, other medicines, foods, dyes, or preservatives -pregnant or trying to get pregnant -breast-feeding How should I use this medicine? This medicine is for injection under the skin. If you get this medicine at home, you will be taught how to prepare and give this medicine. Refer to the Instructions for Use that come with your medication packaging. Use exactly as directed. Take your medicine at regular intervals. Do not take your medicine more often than directed. It is important that you put your used needles and syringes in a special sharps container. Do not put them in a trash can. If you do not have a sharps container, call your pharmacist or healthcare provider to get one. Talk to your pediatrician regarding the use of this medicine in children. Special care may be needed. Overdosage: If you think you have taken too much of this medicine contact a poison control center or emergency room at once. NOTE: This medicine is only for you. Do not share this medicine with others. What if I miss a dose? It is important not to miss your dose. Call your doctor or health care professional if you miss a  dose. What may interact with this medicine? This medicine may interact with the following medications: -medicines that may cause a release of neutrophils, such as lithium This list may not describe all possible interactions. Give your health care provider a list of all the medicines, herbs, non-prescription drugs, or dietary supplements you use. Also tell them if you smoke, drink alcohol, or use illegal drugs. Some items may interact with your medicine. What should I watch for while using this medicine? You may need blood work done while you are taking this medicine. What side effects may I notice from receiving this medicine? Side effects that you should report to your doctor or health care professional as soon as possible: -allergic reactions like skin rash, itching or hives, swelling of the face, lips, or tongue -blood in the urine -dark urine -dizziness -fast heartbeat -feeling faint -shortness of breath or breathing problems -signs and symptoms of infection like fever or chills; cough; or sore throat -signs and symptoms of kidney injury like trouble passing urine or change in the amount of urine -stomach or side pain, or pain at the shoulder -sweating -swelling of the legs, ankles, or abdomen -tiredness Side effects that usually do not require medical attention (report to your doctor or health care professional if they continue or are bothersome): -bone pain -headache -muscle pain -vomiting This list may not describe all possible side effects. Call your doctor for medical advice about side effects. You may report side effects to FDA at 1-800-FDA-1088. Where should I keep my medicine? Keep out of the reach of children. Store in a refrigerator between   2 and 8 degrees C (36 and 46 degrees F). Keep in carton to protect from light. Throw away this medicine if it is left out of the refrigerator for more than 5 consecutive days. Throw away any unused medicine after the expiration  date. NOTE: This sheet is a summary. It may not cover all possible information. If you have questions about this medicine, talk to your doctor, pharmacist, or health care provider.  2018 Elsevier/Gold Standard (2015-07-04 19:07:04)  

## 2017-08-27 ENCOUNTER — Inpatient Hospital Stay: Payer: 59

## 2017-08-27 ENCOUNTER — Other Ambulatory Visit: Payer: Self-pay | Admitting: Oncology

## 2017-08-27 VITALS — BP 125/68 | HR 100 | Temp 98.5°F | Resp 18

## 2017-08-27 DIAGNOSIS — Z171 Estrogen receptor negative status [ER-]: Principal | ICD-10-CM

## 2017-08-27 DIAGNOSIS — Z5111 Encounter for antineoplastic chemotherapy: Secondary | ICD-10-CM | POA: Diagnosis not present

## 2017-08-27 DIAGNOSIS — C50411 Malignant neoplasm of upper-outer quadrant of right female breast: Secondary | ICD-10-CM

## 2017-08-27 MED ORDER — TBO-FILGRASTIM 480 MCG/0.8ML ~~LOC~~ SOSY
480.0000 ug | PREFILLED_SYRINGE | Freq: Once | SUBCUTANEOUS | Status: AC
  Administered 2017-08-27: 480 ug via SUBCUTANEOUS

## 2017-08-27 MED ORDER — TBO-FILGRASTIM 480 MCG/0.8ML ~~LOC~~ SOSY
PREFILLED_SYRINGE | SUBCUTANEOUS | Status: AC
  Filled 2017-08-27: qty 0.8

## 2017-08-27 NOTE — Patient Instructions (Signed)
Tbo-Filgrastim injection What is this medicine? TBO-FILGRASTIM (T B O fil GRA stim) is a granulocyte colony-stimulating factor that stimulates the growth of neutrophils, a type of white blood cell important in the body's fight against infection. It is used to reduce the incidence of fever and infection in patients with certain types of cancer who are receiving chemotherapy that affects the bone marrow. This medicine may be used for other purposes; ask your health care provider or pharmacist if you have questions. COMMON BRAND NAME(S): Granix What should I tell my health care provider before I take this medicine? They need to know if you have any of these conditions: -bone scan or tests planned -kidney disease -sickle cell anemia -an unusual or allergic reaction to tbo-filgrastim, filgrastim, pegfilgrastim, other medicines, foods, dyes, or preservatives -pregnant or trying to get pregnant -breast-feeding How should I use this medicine? This medicine is for injection under the skin. If you get this medicine at home, you will be taught how to prepare and give this medicine. Refer to the Instructions for Use that come with your medication packaging. Use exactly as directed. Take your medicine at regular intervals. Do not take your medicine more often than directed. It is important that you put your used needles and syringes in a special sharps container. Do not put them in a trash can. If you do not have a sharps container, call your pharmacist or healthcare provider to get one. Talk to your pediatrician regarding the use of this medicine in children. Special care may be needed. Overdosage: If you think you have taken too much of this medicine contact a poison control center or emergency room at once. NOTE: This medicine is only for you. Do not share this medicine with others. What if I miss a dose? It is important not to miss your dose. Call your doctor or health care professional if you miss a  dose. What may interact with this medicine? This medicine may interact with the following medications: -medicines that may cause a release of neutrophils, such as lithium This list may not describe all possible interactions. Give your health care provider a list of all the medicines, herbs, non-prescription drugs, or dietary supplements you use. Also tell them if you smoke, drink alcohol, or use illegal drugs. Some items may interact with your medicine. What should I watch for while using this medicine? You may need blood work done while you are taking this medicine. What side effects may I notice from receiving this medicine? Side effects that you should report to your doctor or health care professional as soon as possible: -allergic reactions like skin rash, itching or hives, swelling of the face, lips, or tongue -blood in the urine -dark urine -dizziness -fast heartbeat -feeling faint -shortness of breath or breathing problems -signs and symptoms of infection like fever or chills; cough; or sore throat -signs and symptoms of kidney injury like trouble passing urine or change in the amount of urine -stomach or side pain, or pain at the shoulder -sweating -swelling of the legs, ankles, or abdomen -tiredness Side effects that usually do not require medical attention (report to your doctor or health care professional if they continue or are bothersome): -bone pain -headache -muscle pain -vomiting This list may not describe all possible side effects. Call your doctor for medical advice about side effects. You may report side effects to FDA at 1-800-FDA-1088. Where should I keep my medicine? Keep out of the reach of children. Store in a refrigerator between   2 and 8 degrees C (36 and 46 degrees F). Keep in carton to protect from light. Throw away this medicine if it is left out of the refrigerator for more than 5 consecutive days. Throw away any unused medicine after the expiration  date. NOTE: This sheet is a summary. It may not cover all possible information. If you have questions about this medicine, talk to your doctor, pharmacist, or health care provider.  2018 Elsevier/Gold Standard (2015-07-04 19:07:04)  

## 2017-08-29 ENCOUNTER — Other Ambulatory Visit: Payer: Self-pay

## 2017-08-29 ENCOUNTER — Inpatient Hospital Stay: Payer: 59

## 2017-08-29 VITALS — BP 125/67 | HR 97 | Temp 97.0°F | Resp 16

## 2017-08-29 DIAGNOSIS — Z5111 Encounter for antineoplastic chemotherapy: Secondary | ICD-10-CM | POA: Diagnosis not present

## 2017-08-29 DIAGNOSIS — Z171 Estrogen receptor negative status [ER-]: Principal | ICD-10-CM

## 2017-08-29 DIAGNOSIS — C50411 Malignant neoplasm of upper-outer quadrant of right female breast: Secondary | ICD-10-CM

## 2017-08-29 LAB — CBC WITH DIFFERENTIAL/PLATELET
Basophils Absolute: 0 10*3/uL (ref 0.0–0.1)
Basophils Relative: 0 %
EOS PCT: 1 %
Eosinophils Absolute: 0.1 10*3/uL (ref 0.0–0.5)
HEMATOCRIT: 30.1 % — AB (ref 34.8–46.6)
Hemoglobin: 10.3 g/dL — ABNORMAL LOW (ref 11.6–15.9)
LYMPHS ABS: 1.5 10*3/uL (ref 0.9–3.3)
LYMPHS PCT: 22 %
MCH: 36.3 pg — AB (ref 25.1–34.0)
MCHC: 34.2 g/dL (ref 31.5–36.0)
MCV: 106 fL — AB (ref 79.5–101.0)
MONO ABS: 0.6 10*3/uL (ref 0.1–0.9)
Monocytes Relative: 9 %
Neutro Abs: 4.6 10*3/uL (ref 1.5–6.5)
Neutrophils Relative %: 68 %
PLATELETS: 319 10*3/uL (ref 145–400)
RBC: 2.84 MIL/uL — ABNORMAL LOW (ref 3.70–5.45)
RDW: 15.1 % — AB (ref 11.2–14.5)
WBC: 6.9 10*3/uL (ref 3.9–10.3)

## 2017-08-29 LAB — COMPREHENSIVE METABOLIC PANEL
ALT: 26 U/L (ref 0–55)
AST: 22 U/L (ref 5–34)
Albumin: 3.9 g/dL (ref 3.5–5.0)
Alkaline Phosphatase: 105 U/L (ref 40–150)
Anion gap: 8 (ref 3–11)
BILIRUBIN TOTAL: 0.2 mg/dL (ref 0.2–1.2)
BUN: 8 mg/dL (ref 7–26)
CO2: 25 mmol/L (ref 22–29)
Calcium: 9.4 mg/dL (ref 8.4–10.4)
Chloride: 106 mmol/L (ref 98–109)
Creatinine, Ser: 0.68 mg/dL (ref 0.60–1.10)
Glucose, Bld: 126 mg/dL (ref 70–140)
Potassium: 3.5 mmol/L (ref 3.5–5.1)
Sodium: 139 mmol/L (ref 136–145)
TOTAL PROTEIN: 6.7 g/dL (ref 6.4–8.3)

## 2017-08-29 MED ORDER — DEXAMETHASONE SODIUM PHOSPHATE 10 MG/ML IJ SOLN
4.0000 mg | Freq: Once | INTRAMUSCULAR | Status: AC
  Administered 2017-08-29: 4 mg via INTRAVENOUS

## 2017-08-29 MED ORDER — HEPARIN SOD (PORK) LOCK FLUSH 100 UNIT/ML IV SOLN
500.0000 [IU] | Freq: Once | INTRAVENOUS | Status: AC | PRN
  Administered 2017-08-29: 500 [IU]
  Filled 2017-08-29: qty 5

## 2017-08-29 MED ORDER — PALONOSETRON HCL INJECTION 0.25 MG/5ML
0.2500 mg | Freq: Once | INTRAVENOUS | Status: AC
  Administered 2017-08-29: 0.25 mg via INTRAVENOUS

## 2017-08-29 MED ORDER — SODIUM CHLORIDE 0.9 % IV SOLN
25.0000 mg | Freq: Once | INTRAVENOUS | Status: AC
  Administered 2017-08-29: 25 mg via INTRAVENOUS
  Filled 2017-08-29: qty 0.5

## 2017-08-29 MED ORDER — SODIUM CHLORIDE 0.9 % IV SOLN
Freq: Once | INTRAVENOUS | Status: AC
  Administered 2017-08-29: 14:00:00 via INTRAVENOUS

## 2017-08-29 MED ORDER — DEXAMETHASONE SODIUM PHOSPHATE 10 MG/ML IJ SOLN
INTRAMUSCULAR | Status: AC
  Filled 2017-08-29: qty 1

## 2017-08-29 MED ORDER — FAMOTIDINE IN NACL 20-0.9 MG/50ML-% IV SOLN
20.0000 mg | Freq: Once | INTRAVENOUS | Status: AC
  Administered 2017-08-29: 20 mg via INTRAVENOUS

## 2017-08-29 MED ORDER — SODIUM CHLORIDE 0.9% FLUSH
10.0000 mL | INTRAVENOUS | Status: DC | PRN
  Administered 2017-08-29: 10 mL
  Filled 2017-08-29: qty 10

## 2017-08-29 MED ORDER — SODIUM CHLORIDE 0.9 % IV SOLN
80.0000 mg/m2 | Freq: Once | INTRAVENOUS | Status: AC
  Administered 2017-08-29: 150 mg via INTRAVENOUS
  Filled 2017-08-29: qty 25

## 2017-08-29 MED ORDER — CARBOPLATIN CHEMO INJECTION 450 MG/45ML
247.0000 mg | Freq: Once | INTRAVENOUS | Status: AC
  Administered 2017-08-29: 250 mg via INTRAVENOUS
  Filled 2017-08-29: qty 25

## 2017-08-29 MED ORDER — PALONOSETRON HCL INJECTION 0.25 MG/5ML
INTRAVENOUS | Status: AC
  Filled 2017-08-29: qty 5

## 2017-08-29 MED ORDER — SODIUM CHLORIDE 0.9% FLUSH
10.0000 mL | Freq: Once | INTRAVENOUS | Status: AC
  Administered 2017-08-29: 10 mL
  Filled 2017-08-29: qty 10

## 2017-08-29 MED ORDER — FAMOTIDINE IN NACL 20-0.9 MG/50ML-% IV SOLN
INTRAVENOUS | Status: AC
  Filled 2017-08-29: qty 50

## 2017-08-29 NOTE — Patient Instructions (Signed)
   [facility] Discharge Instructions for Patients Receiving Chemotherapy  Today you received the following chemotherapy agents Taxol and Carboplatin   To help prevent nausea and vomiting after your treatment, we encourage you to take your nausea medication as directed.    If you develop nausea and vomiting that is not controlled by your nausea medication, call the clinic.   BELOW ARE SYMPTOMS THAT SHOULD BE REPORTED IMMEDIATELY:  *FEVER GREATER THAN 100.5 F  *CHILLS WITH OR WITHOUT FEVER  NAUSEA AND VOMITING THAT IS NOT CONTROLLED WITH YOUR NAUSEA MEDICATION  *UNUSUAL SHORTNESS OF BREATH  *UNUSUAL BRUISING OR BLEEDING  TENDERNESS IN MOUTH AND THROAT WITH OR WITHOUT PRESENCE OF ULCERS  *URINARY PROBLEMS  *BOWEL PROBLEMS  UNUSUAL RASH Items with * indicate a potential emergency and should be followed up as soon as possible.  Feel free to call the clinic should you have any questions or concerns. The clinic phone number is [phone].  Please show the CHEMO ALERT CARD at check-in to the Emergency Department and triage nurse.   

## 2017-09-02 ENCOUNTER — Ambulatory Visit (HOSPITAL_COMMUNITY)
Admission: RE | Admit: 2017-09-02 | Discharge: 2017-09-02 | Disposition: A | Discharge status: Home / Self Care | Payer: 59 | Source: Ambulatory Visit | Attending: Oncology | Admitting: Oncology

## 2017-09-02 ENCOUNTER — Other Ambulatory Visit: Payer: Self-pay | Admitting: Oncology

## 2017-09-02 ENCOUNTER — Encounter: Payer: Self-pay | Admitting: Oncology

## 2017-09-02 DIAGNOSIS — C50411 Malignant neoplasm of upper-outer quadrant of right female breast: Secondary | ICD-10-CM | POA: Insufficient documentation

## 2017-09-02 DIAGNOSIS — C50911 Malignant neoplasm of unspecified site of right female breast: Secondary | ICD-10-CM | POA: Diagnosis not present

## 2017-09-02 DIAGNOSIS — Z171 Estrogen receptor negative status [ER-]: Secondary | ICD-10-CM | POA: Diagnosis not present

## 2017-09-02 MED ORDER — GADOBENATE DIMEGLUMINE 529 MG/ML IV SOLN
15.0000 mL | Freq: Once | INTRAVENOUS | Status: AC | PRN
  Administered 2017-09-02: 15 mL via INTRAVENOUS

## 2017-09-04 DIAGNOSIS — Z171 Estrogen receptor negative status [ER-]: Secondary | ICD-10-CM | POA: Diagnosis not present

## 2017-09-04 DIAGNOSIS — C50411 Malignant neoplasm of upper-outer quadrant of right female breast: Secondary | ICD-10-CM | POA: Diagnosis not present

## 2017-09-04 NOTE — H&P (Addendum)
Subjective:     Patient ID: Jennifer Beck is a 52 y.o. female.  HPI  Here for follow up discussion prior to planned bilateral breast reconstruction. Presented with palpable mass right breast. MMG/US demonstrated indeterminate appearing breast mass at 12:30, 4 cmfn.No evidence of right axillary lymphadenopathy was noted. Biopsy showed IDC with LVI, triple negative. MRI showed known malignancy involving the Right UIQ of measuring 1.8 x 1.7 x 2.5 cm. Two indeterminate foci of LOQ right breast noted, overall span of NME of LOQ measured 7 cm. Addition focus of NME of LEFT UOQ and UIQ noted. Additional biopsies labeled right LIQ with invasive mammary carcinoma ER/PR+, Her2- and right LOQ read as mammary carcinoma in situ. Biopsy LEFT UOQ with ALH and LEFT UIQ with hemorrhage.  Completed neoadjuvant chemotherapy. Final MRI with interval decrease, 1 cm mass in right UIQ that previously measured 2.5 cm, resolution other areas of enhancement.  Genetics negative.  She has elected for bilateral mastectomies.  Current B/C cup, desires no larger but lifted. Wt stable.  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 Can work from home if needed. Her husband Jennifer Beck is a firefighter   Objective:   Physical Exam  Cardiovascular: Normal rate, regular rhythm and normal heart sounds.   Pulmonary/Chest: Effort normal and breath sounds normal.  Lymphadenopathy:    She has no axillary adenopathy.   Right chest port Grade 1 ptosis SN to nipple R 26.5 L 27 BW R 16 L 17 cm (CW 13 cm) Nipple to IMF R 8 L 8 cm     Assessment:     Right breast ca UOQ ER- Neoadjuvant chemotherapy    Plan:    Plan bilateral mastectomies with immediate TE, ADM reconstruction. Reviewed incisions, drains, OR length, hospital stay and post operative limitations. Discussed process of expansion and implant based risks including rupture, surveillance for silicone implants, infection requiring  surgery or removal, contracture. Reviewed SSM vs NSM, noth will be asensate and not stimulate. Reviewed with risks mastectomy flap necrosis requiring additional surgery.  Discussed use of acellular dermis in reconstruction, cadaveric source, incorporation over several weeks, risk that if has seroma or infection can act as additional nidus for infection if not incorporated.  Discussed prepectoral vs sub pectoral reconstruction. Discussed with patient and benefit of this is no animation deformity, may be less pain. Risk may be more visible rippling over upper poles, greater need of ADM. Reviewed pre pectoral would require larger amount acellular dermis, more drains. Discussed any type reconstruction also risks long term displacement implant and visible rippling. If prepectoral counseled I would recommend she be comfortable with silicone implants as more options that have less rippling. She agrees to prepectoral placement.  Reviewed reconstruction will be asensate and not stimulate. Reviewed additional risks including but not limited to risks mastectomy flap necrosis requiring additional surgery, seroma, hematoma, asymmetry, need to additional procedures, fat necrosis, DVT/PE, damage to adjacent structures, cardiopulmonary complications.  At this time she is still considering NSM vs SSM. Will review with Dr. Newman that he is ok with NSM from oncologic perspective if desired. Reviewed we will sample undersurface of nipple at time of surgery in NSM.   Rx for [facility] to Nature given.   Brinda Thimmappa, MD MBA Plastic & Reconstructive Surgery [phone], pin [phone]  ADDENDUM: 5.3.19 Patient has elected to not have NSM. Plan skin reduction pattern, discussed anchor type scars.  Rx for oxycodone, Bactrim, Robaxin given.  

## 2017-09-05 ENCOUNTER — Other Ambulatory Visit: Payer: Self-pay | Admitting: *Deleted

## 2017-09-05 DIAGNOSIS — C50411 Malignant neoplasm of upper-outer quadrant of right female breast: Secondary | ICD-10-CM

## 2017-09-05 DIAGNOSIS — Z171 Estrogen receptor negative status [ER-]: Principal | ICD-10-CM

## 2017-09-16 ENCOUNTER — Ambulatory Visit (HOSPITAL_COMMUNITY)
Admission: RE | Admit: 2017-09-16 | Discharge: 2017-09-16 | Disposition: A | Discharge status: Home / Self Care | Payer: 59 | Source: Ambulatory Visit | Attending: Oncology | Admitting: Oncology

## 2017-09-16 DIAGNOSIS — Z171 Estrogen receptor negative status [ER-]: Secondary | ICD-10-CM | POA: Insufficient documentation

## 2017-09-16 DIAGNOSIS — E785 Hyperlipidemia, unspecified: Secondary | ICD-10-CM | POA: Insufficient documentation

## 2017-09-16 DIAGNOSIS — C50411 Malignant neoplasm of upper-outer quadrant of right female breast: Secondary | ICD-10-CM | POA: Insufficient documentation

## 2017-09-16 NOTE — Progress Notes (Signed)
  Echocardiogram 2D Echocardiogram has been performed.  Jennifer Beck T Jennifer Beck 09/16/2017, 10:43 AM

## 2017-09-19 ENCOUNTER — Other Ambulatory Visit: Payer: Self-pay

## 2017-09-19 ENCOUNTER — Telehealth: Payer: Self-pay

## 2017-09-19 DIAGNOSIS — C50411 Malignant neoplasm of upper-outer quadrant of right female breast: Secondary | ICD-10-CM

## 2017-09-19 DIAGNOSIS — Z171 Estrogen receptor negative status [ER-]: Principal | ICD-10-CM

## 2017-09-19 DIAGNOSIS — E559 Vitamin D deficiency, unspecified: Secondary | ICD-10-CM

## 2017-09-19 NOTE — Telephone Encounter (Signed)
Received VM from pt regarding she is having dull muscle aches in forearms and legs, states she been a few weeks since finished chemo.  Called pt and she reports she is drinking water, maybe not as much as should, denied fever or warmth at area, denied redness in extremities, reports she tried rubbing pain cream on legs before bed-helped but woke up in night with the dull aches in legs.  Reports happens mostly at night, tylenol helps some.  Reported to [facility] NP and she would like pt to come in tomorrow (Friday 4-25) to see Sandi Mealy PA and have labs: CMP, CBC, Magnesium, and VIt D level drawn.  Pt reports she can come in am for labs then leave and come back for appt with Lucianne Lei PA at 1:15 am as she has an appt for daughter at 3 pm.  Reminded her this is a symptom management area and unsure how long appt would take and she voiced understanding.

## 2017-09-20 ENCOUNTER — Inpatient Hospital Stay: Payer: 59

## 2017-09-20 ENCOUNTER — Telehealth: Payer: Self-pay | Admitting: Medical

## 2017-09-20 ENCOUNTER — Inpatient Hospital Stay (HOSPITAL_BASED_OUTPATIENT_CLINIC_OR_DEPARTMENT_OTHER): Payer: 59 | Admitting: Medical

## 2017-09-20 DIAGNOSIS — Z171 Estrogen receptor negative status [ER-]: Secondary | ICD-10-CM | POA: Diagnosis not present

## 2017-09-20 DIAGNOSIS — C50411 Malignant neoplasm of upper-outer quadrant of right female breast: Secondary | ICD-10-CM

## 2017-09-20 DIAGNOSIS — Z803 Family history of malignant neoplasm of breast: Secondary | ICD-10-CM

## 2017-09-20 DIAGNOSIS — Z800 Family history of malignant neoplasm of digestive organs: Secondary | ICD-10-CM

## 2017-09-20 DIAGNOSIS — Z5111 Encounter for antineoplastic chemotherapy: Secondary | ICD-10-CM | POA: Diagnosis not present

## 2017-09-20 DIAGNOSIS — G2581 Restless legs syndrome: Secondary | ICD-10-CM | POA: Diagnosis not present

## 2017-09-20 DIAGNOSIS — M791 Myalgia, unspecified site: Secondary | ICD-10-CM | POA: Diagnosis not present

## 2017-09-20 DIAGNOSIS — R52 Pain, unspecified: Secondary | ICD-10-CM

## 2017-09-20 DIAGNOSIS — Z8051 Family history of malignant neoplasm of kidney: Secondary | ICD-10-CM | POA: Diagnosis not present

## 2017-09-20 DIAGNOSIS — E559 Vitamin D deficiency, unspecified: Secondary | ICD-10-CM

## 2017-09-20 LAB — COMPREHENSIVE METABOLIC PANEL
ALT: 30 U/L (ref 14–54)
AST: 30 U/L (ref 15–41)
Albumin: 4.1 g/dL (ref 3.5–5.0)
Alkaline Phosphatase: 72 U/L (ref 38–126)
Anion gap: 13 (ref 5–15)
BUN: 13 mg/dL (ref 6–20)
CALCIUM: 9.7 mg/dL (ref 8.9–10.3)
CO2: 24 mmol/L (ref 22–32)
CREATININE: 0.69 mg/dL (ref 0.44–1.00)
Chloride: 105 mmol/L (ref 101–111)
GFR calc non Af Amer: >60 mL/min (ref >60)
Glucose, Bld: 98 mg/dL (ref 65–99)
Potassium: 4.2 mmol/L (ref 3.5–5.1)
SODIUM: 142 mmol/L (ref 135–145)
Total Bilirubin: 0.4 mg/dL (ref 0.3–1.2)
Total Protein: 6.9 g/dL (ref 6.5–8.1)

## 2017-09-20 LAB — CBC WITH DIFFERENTIAL (CANCER CENTER ONLY)
BASOS PCT: 1 %
Basophils Absolute: 0 10*3/uL (ref 0.0–0.1)
Eosinophils Absolute: 0.3 10*3/uL (ref 0.0–0.5)
Eosinophils Relative: 7 %
HEMATOCRIT: 35.3 % (ref 34.8–46.6)
HEMOGLOBIN: 12.3 g/dL (ref 11.6–15.9)
Lymphocytes Relative: 23 %
Lymphs Abs: 1 10*3/uL (ref 0.9–3.3)
MCH: 36.5 pg — ABNORMAL HIGH (ref 25.1–34.0)
MCHC: 34.9 g/dL (ref 31.5–36.0)
MCV: 104.6 fL — ABNORMAL HIGH (ref 79.5–101.0)
MONOS PCT: 12 %
Monocytes Absolute: 0.5 10*3/uL (ref 0.1–0.9)
NEUTROS ABS: 2.4 10*3/uL (ref 1.5–6.5)
NEUTROS PCT: 57 %
Platelet Count: 265 10*3/uL (ref 145–400)
RBC: 3.37 MIL/uL — AB (ref 3.70–5.45)
RDW: 14.7 % — ABNORMAL HIGH (ref 11.2–14.5)
WBC Count: 4.3 10*3/uL (ref 3.9–10.3)

## 2017-09-20 LAB — MAGNESIUM: Magnesium: 1.9 mg/dL (ref 1.7–2.4)

## 2017-09-20 MED ORDER — METHOCARBAMOL 500 MG PO TABS: 500.0000 mg | ORAL_TABLET | Freq: Four times a day (QID) | ORAL | 1 refills | Status: DC | PRN

## 2017-09-20 NOTE — Patient Instructions (Signed)
Implanted Port Home Guide An implanted port is a type of central line that is placed under the skin. Central lines are used to provide IV access when treatment or nutrition needs to be given through a person's veins. Implanted ports are used for long-term IV access. An implanted port may be placed because:  You need IV medicine that would be irritating to the small veins in your hands or arms.  You need long-term IV medicines, such as antibiotics.  You need IV nutrition for a long period.  You need frequent blood draws for lab tests.  You need dialysis.  Implanted ports are usually placed in the chest area, but they can also be placed in the upper arm, the abdomen, or the leg. An implanted port has two main parts:  Reservoir. The reservoir is round and will appear as a small, raised area under your skin. The reservoir is the part where a needle is inserted to give medicines or draw blood.  Catheter. The catheter is a thin, flexible tube that extends from the reservoir. The catheter is placed into a large vein. Medicine that is inserted into the reservoir goes into the catheter and then into the vein.  How will I care for my incision site? Do not get the incision site wet. Bathe or shower as directed by your health care provider. How is my port accessed? Special steps must be taken to access the port:  Before the port is accessed, a numbing cream can be placed on the skin. This helps numb the skin over the port site.  Your health care provider uses a sterile technique to access the port. ? Your health care provider must put on a mask and sterile gloves. ? The skin over your port is cleaned carefully with an antiseptic and allowed to dry. ? The port is gently pinched between sterile gloves, and a needle is inserted into the port.  Only "non-coring" port needles should be used to access the port. Once the port is accessed, a blood return should be checked. This helps ensure that the port  is in the vein and is not clogged.  If your port needs to remain accessed for a constant infusion, a clear (transparent) bandage will be placed over the needle site. The bandage and needle will need to be changed every week, or as directed by your health care provider.  Keep the bandage covering the needle clean and dry. Do not get it wet. Follow your health care provider's instructions on how to take a shower or bath while the port is accessed.  If your port does not need to stay accessed, no bandage is needed over the port.  What is flushing? Flushing helps keep the port from getting clogged. Follow your health care provider's instructions on how and when to flush the port. Ports are usually flushed with saline solution or a medicine called heparin. The need for flushing will depend on how the port is used.  If the port is used for intermittent medicines or blood draws, the port will need to be flushed: ? After medicines have been given. ? After blood has been drawn. ? As part of routine maintenance.  If a constant infusion is running, the port may not need to be flushed.  How long will my port stay implanted? The port can stay in for as long as your health care provider thinks it is needed. When it is time for the port to come out, surgery will be   done to remove it. The procedure is similar to the one performed when the port was put in. When should I seek immediate medical care? When you have an implanted port, you should seek immediate medical care if:  You notice a bad smell coming from the incision site.  You have swelling, redness, or drainage at the incision site.  You have more swelling or pain at the port site or the surrounding area.  You have a fever that is not controlled with medicine.  This information is not intended to replace advice given to you by your health care provider. Make sure you discuss any questions you have with your health care provider. Document  Released: 05/14/2005 Document Revised: 10/20/2015 Document Reviewed: 01/19/2013 Elsevier Interactive Patient Education  2017 Elsevier Inc.  

## 2017-09-20 NOTE — Telephone Encounter (Signed)
No LOS 4/26 °

## 2017-09-21 LAB — VITAMIN D 25 HYDROXY (VIT D DEFICIENCY, FRACTURES): Vit D, 25-Hydroxy: 39.7 ng/mL (ref 30.0–100.0)

## 2017-09-23 ENCOUNTER — Telehealth: Payer: Self-pay

## 2017-09-23 NOTE — Telephone Encounter (Signed)
LVM informing patient that vitamin d level is normal. Left center number for call back with questions/concerns.

## 2017-09-23 NOTE — Telephone Encounter (Signed)
-----   Message from Gardenia Phlegm, NP sent at 09/23/2017  2:23 PM EDT ----- Please let patient know that her vitamin d level is normal.    ----- Message ----- From: Interface, Lab In [place] Sent: 09/20/2017   8:57 AM To: Gardenia Phlegm, NP

## 2017-09-23 NOTE — Progress Notes (Addendum)
Symptoms Management Clinic Progress Note   Jennifer Beck [id number] 05-03-1966 52 y.o.  Jennifer Beck is managed by Dr. Jana Beck  Actively treated with chemotherapy: yes  Current Therapy: Carboplatin and paclitaxel  Last Treated: 08/29/2017 (cycle 13, day 1)  Assessment: Plan:    Myalgia - Plan: methocarbamol (ROBAXIN) 500 MG tablet  Restless leg syndrome - Plan: methocarbamol (ROBAXIN) 500 MG tablet  Malignant neoplasm of upper-outer quadrant of right breast in female, estrogen receptor negative (HCC)   Myalgias of the lower extremities and restless leg syndrome: The patient was given a prescription for Robaxin 500 mg with instructions that she could take this up to 4 times daily as needed.  I have recommended that she take this at least at bedtime.  ER negative malignant neoplasm of the upper outer quadrant of the right breast: The patient continues to be followed by Dr. Jana Beck with her next visit scheduled for 10/18/2017.  Please see After Visit Summary for patient specific instructions.  Future Appointments  Date Time Provider [place]  10/08/2017  7:00 AM [facility] 1 [facility]  10/18/2017  4:00 PM Magrinat, Virgie Dad, MD [address] [facility] None    No orders of the defined types were placed in this encounter.      Subjective:   Patient ID:  Jennifer Beck is a 52 y.o. (DOB 06-Dec-1965) female.  Chief Complaint:  Chief Complaint  Patient presents with  . 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  She continues to be followed by Dr. Jana Beck and is status post cycle 13, day 1 of carboplatin and paclitaxel which was last dosed on 08/29/2017.  She reports having generalized leg and forearm muscle aches.  These increase at nighttime.  She has a history of restless leg syndrome which is decreased with her use of Tylenol.  Medications: I have  reviewed the patient's current medications.  Allergies:  Allergies  Allergen Reactions  . Codeine Itching  . Biaxin [Clarithromycin]     Heart Burn    Past Medical History:  Diagnosis Date  . Chest pain   . Complication of anesthesia   . Endometriosis   . Family history of breast cancer 04/22/2017  . Family history of colon cancer 04/22/2017  . Family history of kidney cancer 04/22/2017  . Fatigue   . GERD (gastroesophageal reflux disease)   . GI bleed    caused by ischemic colitis following an episode of hypertension  . Heart palpitations   . Hx of echocardiogram    a. echo 9/13:  EF 55-60%, Gr 2 diast dysfn  . Hypercholesterolemia   . Ischemic colitis ([place])   . 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 Myocarditis ([place])   . Pericarditis   . PONV (postoperative nausea and vomiting)   . SOB (shortness of breath)     Past Surgical History:  Procedure Laterality Date  . CARDIAC CATHETERIZATION  08/26/2001   EF of 60% --- smooth & normal coronary arteries -- there is a Divelbiss motion defect consistent with posterolateral MI -- she quite possibly had a coronary spasm -- she may have some pericarditis secondary to her MI   . DILATION AND CURETTAGE OF UTERUS  10/15/2003   Uterine enlargement, menorrhagia  . DILATION AND CURETTAGE OF UTERUS  05/27/2002   Dysfunctional  uterine bleeding  . LAPAROSCOPY    . NOVASURE ABLATION  10/15/2003   for management of her extreme menorrhagi  . PORTACATH PLACEMENT Right 04/02/2017   Procedure: INSERTION PORT-A-CATH;  Surgeon: [name], MD;  Location: [facility];  Service: General;  Laterality: Right;  . TUBAL LIGATION  09/2000   bilateral    Family History  Problem Relation Age of Onset  . Hypertension Father   . Diabetes Father   . Other Father        stomach mass suspicious for ca, never bx  . Coronary artery disease Mother   . Kidney cancer Mother 17  . Coronary artery disease  Maternal Grandfather   . Colon cancer Maternal Grandfather 61       recurrence  . Coronary artery disease Maternal Uncle   . Coronary artery disease Maternal Uncle   . Diabetes Maternal Uncle   . Colon cancer Maternal Grandmother 70  . Breast cancer Cousin 44       had GT- CHEK2+  . Cervical cancer Maternal Aunt 35  . Diabetes Maternal Aunt   . Alzheimer's disease Paternal Grandmother   . Colon cancer Paternal Grandfather 37  . Breast cancer Paternal Aunt   . Breast cancer Paternal Aunt   . Breast cancer Other 70    Social History   Socioeconomic History  . Marital status: Married    Spouse name: Not on file  . Number of children: Not on file  . Years of education: Not on file  . Highest education level: Not on file  Occupational History  . Not on file  Social Needs  . Financial resource strain: Not on file  . Food insecurity:    Worry: Not on file    Inability: Not on file  . Transportation needs:    Medical: Not on file    Non-medical: Not on file  Tobacco Use  . Smoking status: Never Smoker  . Smokeless tobacco: Never Used  Substance and Sexual Activity  . Alcohol use: Yes    Comment: Occasionally drinks wine  . Drug use: No  . Sexual activity: Not on file  Lifestyle  . Physical activity:    Days per week: Not on file    Minutes per session: Not on file  . Stress: Not on file  Relationships  . Social connections:    Talks on phone: Not on file    Gets together: Not on file    Attends religious service: Not on file    Active member of club or organization: Not on file    Attends meetings of clubs or organizations: Not on file    Relationship status: Not on file  . Intimate partner violence:    Fear of current or ex partner: Not on file    Emotionally abused: Not on file    Physically abused: Not on file    Forced sexual activity: Not on file  Other Topics Concern  . Not on file  Social History Narrative  . Not on file    Past Medical History,  Surgical history, Social history, and Family history were reviewed and updated as appropriate.   Please see review of systems for further details on the patient's review from today.   Review of Systems:  Review of Systems  Constitutional: Negative for diaphoresis.  HENT: Negative for trouble swallowing.   Respiratory: Negative for cough, choking, chest tightness, shortness of breath and wheezing.   Cardiovascular: Negative for chest pain and palpitations.  Gastrointestinal: Negative for nausea and vomiting.  Musculoskeletal: Positive for myalgias. Negative for back pain.       Muscle spasms.  Skin: Negative for color change and rash.  Neurological: Negative for dizziness, light-headedness and headaches.    Objective:   Physical Exam:  There were no vitals taken for this visit. ECOG: 0  Physical Exam  Constitutional: No distress.  HENT:  Head: Normocephalic and atraumatic.  Cardiovascular: Normal rate, regular rhythm and normal heart sounds. Exam reveals no gallop and no friction rub.  No murmur heard. Pulmonary/Chest: Effort normal and breath sounds normal. No respiratory distress. She has no wheezes. She has no rales.  Neurological: She is alert.  Skin: Skin is warm and dry. No rash noted. She is not diaphoretic. No erythema.    Lab Review:     Component Value Date/Time   NA 142 09/20/2017 0845   NA 137 05/30/2017 1219   K 4.2 09/20/2017 0845   K 3.8 05/30/2017 1219   CL 105 09/20/2017 0845   CO2 24 09/20/2017 0845   CO2 25 05/30/2017 1219   GLUCOSE 98 09/20/2017 0845   GLUCOSE 122 05/30/2017 1219   BUN 13 09/20/2017 0845   BUN 8.9 05/30/2017 1219   CREATININE 0.69 09/20/2017 0845   CREATININE 0.7 05/30/2017 1219   CALCIUM 9.7 09/20/2017 0845   CALCIUM 9.4 05/30/2017 1219   PROT 6.9 09/20/2017 0845   PROT 6.4 05/30/2017 1219   ALBUMIN 4.1 09/20/2017 0845   ALBUMIN 3.7 05/30/2017 1219   AST 30 09/20/2017 0845   AST 12 05/30/2017 1219   ALT 30 09/20/2017 0845     ALT 14 05/30/2017 1219   ALKPHOS 72 09/20/2017 0845   ALKPHOS 137 05/30/2017 1219   BILITOT 0.4 09/20/2017 0845   BILITOT 0.38 05/30/2017 1219   GFRNONAA >60 09/20/2017 0845   GFRAA >60 09/20/2017 0845       Component Value Date/Time   WBC 4.3 09/20/2017 0845   WBC 6.9 08/29/2017 1244   RBC 3.37 (L) 09/20/2017 0845   HGB 12.3 09/20/2017 0845   HGB 10.5 (L) 05/30/2017 1219   HCT 35.3 09/20/2017 0845   HCT 30.7 (L) 05/30/2017 1219   PLT 265 09/20/2017 0845   PLT 231 05/30/2017 1219   MCV 104.6 (H) 09/20/2017 0845   MCV 95.3 05/30/2017 1219   MCH 36.5 (H) 09/20/2017 0845   MCHC 34.9 09/20/2017 0845   RDW 14.7 (H) 09/20/2017 0845   RDW 13.4 05/30/2017 1219   LYMPHSABS 1.0 09/20/2017 0845   LYMPHSABS 0.7 (L) 05/30/2017 1219   MONOABS 0.5 09/20/2017 0845   MONOABS 0.4 05/30/2017 1219   EOSABS 0.3 09/20/2017 0845   EOSABS 0.1 05/30/2017 1219   BASOSABS 0.0 09/20/2017 0845   BASOSABS 0.0 05/30/2017 1219   -------------------------------  Imaging from last 24 hours (if applicable):  Radiology interpretation: Mr Breast Bilateral W Wo Contrast Inc Cad  Result Date: 09/02/2017 CLINICAL DATA:  Biopsy proven triple negative invasive ductal carcinoma in the 12:30 region of the right breast (ribbon shaped clip), 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 Biopsy proven atypical lobular hyperplasia and sclerosing adenosis in the upper outer quadrant of the left breast (barbell shaped clip) and hemorrhage in the upper-inner quadrant of the left breast cylindrical shaped clip) which was felt to be discordant and excision recommended. An additional focus  of non masslike enhancement within the upper left breast, 12 o'clock located between the 2 biopsy sites was identified. Additional MR guided core biopsy was recommended if  breast conservation was being considered. LABS:  None obtained at the time of imaging. EXAM: BILATERAL BREAST MRI WITH AND WITHOUT CONTRAST TECHNIQUE: Multiplanar, multisequence MR images of both breasts were obtained prior to and following the intravenous administration of 15 ml of MultiHance. THREE-DIMENSIONAL MR IMAGE RENDERING ON INDEPENDENT WORKSTATION: Three-dimensional MR images were rendered by post-processing of the original MR data on an independent workstation. The three-dimensional MR images were interpreted, and findings are reported in the following complete MRI report for this study. Three dimensional images were evaluated at the independent DynaCad workstation COMPARISON:  Prior images including MRI dated 03/28/2017. FINDINGS: Breast composition: d. Extreme fibroglandular tissue. Background parenchymal enhancement: Moderate. Right breast: Right-sided Port-A-Cath. Enhancing mass in the upper-inner quadrant of right breast measures 0.8 x 0.7 x 1.0 cm. On the prior MRI it measured 1.8 x 1.7 x 2.5 cm. Signal void artifact is seen in the mass from the biopsy clip. There is a signal void artifact in the lower-inner quadrant of the right breast from the prior biopsy. There is no surrounding enhancement. There is a signal void artifact in the lateral aspect of the right breast from a biopsy clip. There is no surrounding enhancement. Left breast: No mass or abnormal enhancement. Signal void artifact from the biopsy clip in the upper-inner quadrant of the left breast is visualized. Minimal non masslike enhancement is seen in this area. Signal void artifact in the upper-outer quadrant of the left breast from a biopsy clip is visualized. There is no surrounding enhancement. Lymph nodes: No abnormal appearing lymph nodes. Ancillary findings:  None. IMPRESSION: Significant interval improvement in enhancement in both breast. There is a 1 cm mass in the upper-inner quadrant of the right breast that previously  measured 2.5 cm. The other areas of enhancement in both breast are no longer visualized. RECOMMENDATION: Treatment planning of the biopsy proven malignancies and MCIS in the right breast and atypical lobular hyperplasia in the upper-outer quadrant of the left breast as well as the hemorrhage in the upper-inner quadrant thought to be discordant and excision was recommended. On the patient's prior MRI dated 03/28/2017 an area of non masslike enhancement was seen between the 2 biopsy sites in the left breast and biopsy had been recommended if conservative therapy was being considered. This area is no longer visualized on the MRI. BI-RADS CATEGORY  6: Known biopsy-proven malignancy. Electronically Signed   By: Lillia Mountain M.D.   On: 09/02/2017 16:59

## 2017-09-27 DIAGNOSIS — Z171 Estrogen receptor negative status [ER-]: Secondary | ICD-10-CM | POA: Diagnosis not present

## 2017-09-27 DIAGNOSIS — C50411 Malignant neoplasm of upper-outer quadrant of right female breast: Secondary | ICD-10-CM | POA: Diagnosis not present

## 2017-10-01 ENCOUNTER — Encounter (HOSPITAL_BASED_OUTPATIENT_CLINIC_OR_DEPARTMENT_OTHER): Payer: Self-pay | Admitting: *Deleted

## 2017-10-01 ENCOUNTER — Other Ambulatory Visit: Payer: Self-pay

## 2017-10-01 NOTE — Progress Notes (Signed)
Pti in to pick up Ensure pre op drink. [place] tour given and questions answered.

## 2017-10-07 NOTE — Anesthesia Preprocedure Evaluation (Signed)
Anesthesia Evaluation  Patient identified by MRN, date of birth, ID band Patient awake    Reviewed: Allergy & Precautions, NPO status , [name] Chart, lab work & pertinent test results  History of Anesthesia Complications (+) PONV and history of anesthetic complications  Airway Mallampati: II  TM Distance: >3 FB Neck ROM: Full    Dental no notable dental hx.    Pulmonary neg pulmonary ROS,    Pulmonary exam normal breath sounds clear to auscultation       Cardiovascular Normal cardiovascular exam Rhythm:Regular Rate:Normal  Echo 11/18  The cavity size was normal. Systolic function was normal. The   estimated ejection fraction was in the range of 55% to 60%. [place]   motion was normal; there were no regional [place] motion   abnormalities.    Neuro/Psych negative neurological ROS  negative psych ROS   GI/Hepatic Neg liver ROS, GERD  Medicated,  Endo/Other  negative endocrine ROS  Renal/GU negative Renal ROS  negative genitourinary   Musculoskeletal negative musculoskeletal ROS (+)   Abdominal   Peds negative pediatric ROS (+)  Hematology negative hematology ROS (+)   Anesthesia Other Findings   Reproductive/Obstetrics negative OB ROS                            Anesthesia Physical  Anesthesia Plan  ASA: II  Anesthesia Plan: General   Post-op Pain Management:  Regional for Post-op pain   Induction: Intravenous  PONV Risk Score and Plan: 4 or greater and Treatment may vary due to age or medical condition, Dexamethasone, Ondansetron, Scopolamine patch - Pre-op and Propofol infusion  Airway Management Planned: LMA and Oral ETT  Additional Equipment:   Intra-op Plan:   Post-operative Plan: Extubation in OR  Informed Consent: I have reviewed the patients History and Physical, chart, labs and discussed the procedure including the risks, benefits and alternatives for the proposed  anesthesia with the patient or authorized representative who has indicated his/her understanding and acceptance.   Dental advisory given  Plan Discussed with: CRNA, Surgeon and Anesthesiologist  Anesthesia Plan Comments:        Anesthesia Quick Evaluation

## 2017-10-07 NOTE — H&P (Signed)
Jennifer Beck  Location: [facility] Patient #: [id number] DOB: 1965-09-02 Married / Language: English / Race: White Female  History of Present Illness   The patient is a 52 year old female who presents with a complaint of right breast cancer.  The PCP is Jennifer Beck [facility])  Gyn - Dr. Garth Schlatter, NP  The pateint was seen at the [facility] - Oncology is Jennifer Beck and Jennifer Beck  She  completed her neoadjuvant chemotx. She is complaining of tingling in her fingers, but not numbness. Patient complaining of changes in her back. Her chemotherapy is on hold because of low blood counts. Currently she is planned to be finished Jennifer 4. She will get her breast MRI about a week later. With 3 separate cancers in her right breast, I think that even if the MRI shows a good response, it would be impractical to try to "keep" her right breast. I think that she would be best served with a right mastectomy. It is more complicated on the left. Her left breast biopsy there is benign, but she should at least have the left breast ALH excised. It may be benign and she would need no further tx. But she is leaning towards a left mastectomy also. She has seen Jennifer Beck for consideration of immediate reconstruction. We talked about nipple sparing mastectomies versus the more traditional mastectomy. We talked about skin problems, nerve problems, recurrent caner, possible radiation tx, and overall long term follow up.  History of breast cancer: She had a negative mammogram in March 2018. She felt a mass in her right breast above her nipple in September. She did have repeat mammograms at the breast center. She's had an ablation, but still has the symptoms of having periods. She is not on hormone replacement medicine. Mammograms: [facility], 03/18/2017 - Ultrasound targeted to the right breast at 12:30, 4 cm from the nipple demonstrates an  irregular hypoechoic mass with indistinct margins measuring 2.4 x 1.3 x 1.9 cm. Biopsy: 03/21/2017 at 12:30 o'clock [phone]) - IDC, Grade 3, Triple negative, Ki67 - 80% Family history of breast or ovarian cancer: Mother's neice On hormone therapy: No  Addendum Note Jennifer Brow H. [name] MD; 04/16/2017 3:41 PM) Biopsies of right breast:  1. Left breast, upper inner quadrant - hemorrhage. 2. Left breast, upper outer quadrant -ALH in sclerosing adenosis 3. 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 Right breast, lower outer quadrant- DCIS, sclerosing adenosis, ----------------------------------------- She'll probably need a right mastectomy and furhter biopsies on the left or a prophylactic mastectomy on the left.  Past Medical History: 1. Right breast cancer x 3 Right breast biopsy: 03/21/2017 at 12:30 o'clock [phone]) - IDC, Grade 3, Triple negative, Ki67 - 80% Right breast biopsy 04/11/2018 ([id number]) - IDC lower inner quadrant and DCIS in lower outer quadrant  2. Left breast Left breast biopsy in the UOQ - 04/11/2018 ([id number]) - lobular neoplasia [facility]) 3. 2 laparoscopies for endometriosis - Jennifer Beck She still has some symptoms 4. Pericarditis - about 2003 She ahd a normal cardiac cath She saw Jennifer Beck, but last saw him in 2013 5. Power port placed, rigth IJ - 04/02/2017 - Jennifer Beck 6. Genetics are negative. She does have a VUS in POLD1.  Social History: Married, Jennifer Beck 1 daughter - Jennifer Beck - [employer] student  Her daughter left for [place] last week. works  for insurance benefits - Jennifer Beck and Jennifer Beck  She knows Jennifer Beck - a patient of mine who had bilateral mastectomies in 2008.   Allergies (Jennifer Beck, CMA; 08/15/2017 9:23 AM) Codeine Phosphate *ANALGESICS - OPIOID*  Biaxin  *MACROLIDES*   Medication History (Jennifer Beck, CMA; 08/15/2017 9:24 AM) Aspirin (81MG Tablet DR, Oral) Active. Vitamin D (2000UNIT Capsule, Oral) Active. CeleXA (10MG Tablet, Oral) Active. Ambien (10MG Tablet, Oral) Active. Vitamin E (100UNIT Tablet, Oral) Active. Multiple Vitamin (1 (one) Oral) Active. LORazepam (0.5MG Tablet, Oral) Active. Omeprazole (40MG Capsule DR, Oral) Active. Medications Reconciled    Review of Systems Jennifer Brow H. Lucia Gaskins, MD; 08/15/2017 8:20 AM) General Not Present- Appetite Loss, Chills, Fatigue, Fever, Night Sweats, Weight Gain and Weight Loss. Skin Present- Dryness. Not Present- Change in Wart/Mole, Hives, Jaundice, New Lesions, Non-Healing Wounds, Rash and Ulcer. HEENT Present- Wears glasses/contact lenses. Not Present- Earache, Hearing Loss, Hoarseness, Nose Bleed, Oral Ulcers, Ringing in the Ears, Seasonal Allergies, Sinus Pain, Sore Throat, Visual Disturbances and Yellow Eyes. Respiratory Not Present- Bloody sputum, Chronic Cough, Difficulty Breathing, Snoring and Wheezing. Breast Present- Breast Mass. Not Present- Breast Pain, Nipple Discharge and Skin Changes. Cardiovascular Not Present- Chest Pain, Difficulty Breathing Lying Down, Leg Cramps, Palpitations, Rapid Heart Rate, Shortness of Breath and Swelling of Extremities. Gastrointestinal Present- Constipation. Not Present- Abdominal Pain, Bloating, Bloody Stool, Change in Bowel Habits, Chronic diarrhea, Difficulty Swallowing, Excessive gas, Gets full quickly at meals, Hemorrhoids, Indigestion, Nausea, Rectal Pain and Vomiting. Female Genitourinary Not Present- Frequency, Nocturia, Painful Urination, Pelvic Pain and Urgency. Musculoskeletal Not Present- Back Pain, Joint Pain, Joint Stiffness, Muscle Pain, Muscle Weakness and Swelling of Extremities. Neurological Not Present- Decreased Memory, Fainting, Headaches, Numbness, Seizures, Tingling, Tremor, Trouble walking and Weakness. Psychiatric Not  Present- Anxiety, Bipolar, Change in Sleep Pattern, Depression, Fearful and Frequent crying. Endocrine Not Present- Cold Intolerance, Excessive Hunger, Hair Changes, Heat Intolerance, Hot flashes and New Diabetes. Hematology Present- Easy Bruising. Not Present- Blood Thinners, Excessive bleeding, Gland problems, HIV and Persistent Infections.  Vitals (Jennifer Beck CMA; 08/15/2017 9:24 AM) 08/15/2017 9:24 AM Weight: 160.13 lb Height: 62in Body Surface Area: 1.74 m Body Mass Index: 29.29 kg/m  Temp.: 98.51F(Oral)  Pulse: 103 (Regular)  BP: 134/78 (Sitting, Left Arm, Standard)   Physical Exam  General: WN WF alert and generally healthy appearing. She has lost her hair, but her wig looks good. She says that it has fooled many people Skin: Inspection and palpation of the skin unremarkable.  Eyes: Conjunctivae white, pupils equal. Face, ears, nose, mouth, and throat: Face - normal. Normal ears and nose. Lips and teeth normal.  Neck: Supple. No mass. Trachea midline. No thyroid mass.  Lymph Nodes: No supraclavicular or cervical adenopathy. No axillary adenopathy.  Lungs: Normal respiratory effort. Clear to auscultation and symmetric breath sounds. Cardiovascular: Regular rate and rythm. Normal auscultation of the heart. No murmur or rub.  Breasts: Right - The nodular area that I could feel before in her upper right breast is gone. She has a port in the UIQ of the right breast. She has some pigmentation of her skin where she had prior biopsies. Left - No mass or nodule  Musculoskeletal/extremities: Normal gait. Good strength and ROM in upper and lower extremities.   Assessment & Plan  1.  BREAST CANCER, STAGE 2, RIGHT (C50.911)  Story: Biopsy: 03/21/2017 at 12:30 o'clock ([id number]) - IDC, Grade 3, Triple negative, Ki67 - 80%  Oncology - Jennifer Beck and Squire  Plan:   1) Has completed neoadjuvant chemotx on  08/29/2017  2)    Her MRI from  09/02/2017 is favorable, in that most of the visible tumor is gone.   3) Bilateral mastectomies - right axillary sentinel lymph node biopsy, immediate reconstruction by Jennifer Beck    Addendum Note(Jennifer Brahmbhatt H. Lucia Gaskins MD; 09/29/2017 9:02 PM)   Hi   Just FYI patient does NOT want NSM. She is worried about nipples being in wrong place.   Thanks Jennifer Beck  2.  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 2 laparoscopies for endometriosis - Jennifer Beck  She still has some symptoms 4. Pericarditis - about 2003  She ahd a normal cardiac cath  She saw Jennifer Beck, but last saw him in 2013 5. Power port placed, rigth IJ - 04/02/2017 - D. Tysheena Ginzburg 6. Genetics are negative. She does have a VUS in POLD1.  [name], MD, [facility] Pager: [phone] Office phone:  [phone]

## 2017-10-08 ENCOUNTER — Ambulatory Visit (HOSPITAL_BASED_OUTPATIENT_CLINIC_OR_DEPARTMENT_OTHER)
Admission: RE | Admit: 2017-10-08 | Discharge: 2017-10-09 | Disposition: A | Discharge status: Home / Self Care | Payer: 59 | Source: Ambulatory Visit | Attending: Surgery | Admitting: Surgery

## 2017-10-08 ENCOUNTER — Ambulatory Visit (HOSPITAL_BASED_OUTPATIENT_CLINIC_OR_DEPARTMENT_OTHER): Payer: 59 | Admitting: Anesthesiology

## 2017-10-08 ENCOUNTER — Encounter (HOSPITAL_BASED_OUTPATIENT_CLINIC_OR_DEPARTMENT_OTHER)
Admission: RE | Disposition: A | Discharge status: Home / Self Care | Payer: Self-pay | Source: Ambulatory Visit | Attending: Surgery

## 2017-10-08 ENCOUNTER — Encounter (HOSPITAL_BASED_OUTPATIENT_CLINIC_OR_DEPARTMENT_OTHER): Payer: Self-pay | Admitting: *Deleted

## 2017-10-08 ENCOUNTER — Other Ambulatory Visit: Payer: Self-pay

## 2017-10-08 ENCOUNTER — Ambulatory Visit (HOSPITAL_COMMUNITY)
Admission: RE | Admit: 2017-10-08 | Discharge: 2017-10-08 | Disposition: A | Discharge status: Home / Self Care | Payer: 59 | Source: Ambulatory Visit | Attending: Surgery | Admitting: Surgery

## 2017-10-08 DIAGNOSIS — N6012 Diffuse cystic mastopathy of left breast: Secondary | ICD-10-CM | POA: Diagnosis not present

## 2017-10-08 DIAGNOSIS — K219 Gastro-esophageal reflux disease without esophagitis: Secondary | ICD-10-CM | POA: Insufficient documentation

## 2017-10-08 DIAGNOSIS — Z79899 Other long term (current) drug therapy: Secondary | ICD-10-CM | POA: Insufficient documentation

## 2017-10-08 DIAGNOSIS — C50911 Malignant neoplasm of unspecified site of right female breast: Secondary | ICD-10-CM | POA: Diagnosis not present

## 2017-10-08 DIAGNOSIS — Z7982 Long term (current) use of aspirin: Secondary | ICD-10-CM | POA: Insufficient documentation

## 2017-10-08 DIAGNOSIS — G8918 Other acute postprocedural pain: Secondary | ICD-10-CM | POA: Diagnosis not present

## 2017-10-08 DIAGNOSIS — C50411 Malignant neoplasm of upper-outer quadrant of right female breast: Secondary | ICD-10-CM | POA: Diagnosis not present

## 2017-10-08 DIAGNOSIS — N62 Hypertrophy of breast: Secondary | ICD-10-CM | POA: Diagnosis not present

## 2017-10-08 DIAGNOSIS — Z171 Estrogen receptor negative status [ER-]: Secondary | ICD-10-CM | POA: Diagnosis not present

## 2017-10-08 DIAGNOSIS — N6022 Fibroadenosis of left breast: Secondary | ICD-10-CM | POA: Diagnosis not present

## 2017-10-08 HISTORY — DX: Unspecified osteoarthritis, unspecified site: M19.90

## 2017-10-08 HISTORY — PX: MASTECTOMY W/ SENTINEL NODE BIOPSY: SHX2001

## 2017-10-08 HISTORY — PX: BREAST RECONSTRUCTION WITH PLACEMENT OF TISSUE EXPANDER AND ALLODERM: SHX6805

## 2017-10-08 SURGERY — MASTECTOMY WITH SENTINEL LYMPH NODE BIOPSY
Anesthesia: General | Site: Breast | Laterality: Bilateral

## 2017-10-08 MED ORDER — SUFENTANIL CITRATE 50 MCG/ML IV SOLN
INTRAVENOUS | Status: AC
  Filled 2017-10-08: qty 1

## 2017-10-08 MED ORDER — TECHNETIUM TC 99M SULFUR COLLOID FILTERED
1.0000 | Freq: Once | INTRAVENOUS | Status: AC | PRN
  Administered 2017-10-08: 1 via INTRADERMAL

## 2017-10-08 MED ORDER — ROCURONIUM BROMIDE 100 MG/10ML IV SOLN
INTRAVENOUS | Status: DC | PRN
  Administered 2017-10-08: 50 mg via INTRAVENOUS

## 2017-10-08 MED ORDER — ONDANSETRON HCL 4 MG/2ML IJ SOLN
INTRAMUSCULAR | Status: DC | PRN
  Administered 2017-10-08: 4 mg via INTRAVENOUS

## 2017-10-08 MED ORDER — MIDAZOLAM HCL 2 MG/2ML IJ SOLN
1.0000 mg | INTRAMUSCULAR | Status: DC | PRN
  Administered 2017-10-08 (×2): 2 mg via INTRAVENOUS

## 2017-10-08 MED ORDER — LIDOCAINE-EPINEPHRINE (PF) 1 %-1:200000 IJ SOLN
INTRAMUSCULAR | Status: DC | PRN
  Administered 2017-10-08: 30 mL

## 2017-10-08 MED ORDER — OXYCODONE HCL 5 MG/5ML PO SOLN: 5.0000 mg | Freq: Once | ORAL | Status: DC | PRN

## 2017-10-08 MED ORDER — GABAPENTIN 300 MG PO CAPS
300.0000 mg | ORAL_CAPSULE | Freq: Two times a day (BID) | ORAL | Status: DC
  Administered 2017-10-08: 300 mg via ORAL

## 2017-10-08 MED ORDER — ONDANSETRON HCL 4 MG/2ML IJ SOLN
4.0000 mg | Freq: Once | INTRAMUSCULAR | Status: DC | PRN
Start: 2017-10-08 — End: 2017-10-09

## 2017-10-08 MED ORDER — DEXMEDETOMIDINE HCL IN NACL 200 MCG/50ML IV SOLN
INTRAVENOUS | Status: AC
  Filled 2017-10-08: qty 50

## 2017-10-08 MED ORDER — SCOPOLAMINE 1 MG/3DAYS TD PT72
1.0000 | MEDICATED_PATCH | Freq: Once | TRANSDERMAL | Status: DC | PRN
  Administered 2017-10-08: 1.5 mg via TRANSDERMAL

## 2017-10-08 MED ORDER — FENTANYL CITRATE (PF) 100 MCG/2ML IJ SOLN: 25.0000 ug | INTRAMUSCULAR | Status: DC | PRN

## 2017-10-08 MED ORDER — PROPOFOL 10 MG/ML IV BOLUS
INTRAVENOUS | Status: DC | PRN
  Administered 2017-10-08: 50 mg via INTRAVENOUS

## 2017-10-08 MED ORDER — PANTOPRAZOLE SODIUM 40 MG PO TBEC
40.0000 mg | DELAYED_RELEASE_TABLET | Freq: Every day | ORAL | Status: DC
Start: 2017-10-08 — End: 2017-10-09

## 2017-10-08 MED ORDER — ROPIVACAINE HCL 7.5 MG/ML IJ SOLN
INTRAMUSCULAR | Status: DC | PRN
  Administered 2017-10-08: 25 mL via PERINEURAL

## 2017-10-08 MED ORDER — CHLORHEXIDINE GLUCONATE CLOTH 2 % EX PADS: 6.0000 | MEDICATED_PAD | Freq: Once | CUTANEOUS | Status: DC

## 2017-10-08 MED ORDER — GABAPENTIN 300 MG PO CAPS
ORAL_CAPSULE | ORAL | Status: AC
  Filled 2017-10-08: qty 1

## 2017-10-08 MED ORDER — FAMOTIDINE 10 MG PO TABS: 10.0000 mg | ORAL_TABLET | Freq: Two times a day (BID) | ORAL | Status: DC

## 2017-10-08 MED ORDER — LIDOCAINE HCL (CARDIAC) PF 100 MG/5ML IV SOSY
PREFILLED_SYRINGE | INTRAVENOUS | Status: AC
  Filled 2017-10-08: qty 5

## 2017-10-08 MED ORDER — BUPIVACAINE-EPINEPHRINE (PF) 0.25% -1:200000 IJ SOLN
INTRAMUSCULAR | Status: AC
Start: 2017-10-08 — End: ?
  Filled 2017-10-08: qty 30

## 2017-10-08 MED ORDER — PROPOFOL 10 MG/ML IV BOLUS
INTRAVENOUS | Status: AC
  Filled 2017-10-08: qty 20

## 2017-10-08 MED ORDER — FENTANYL CITRATE (PF) 100 MCG/2ML IJ SOLN
INTRAMUSCULAR | Status: AC
  Filled 2017-10-08: qty 2

## 2017-10-08 MED ORDER — ONDANSETRON 4 MG PO TBDP: 4.0000 mg | ORAL_TABLET | Freq: Four times a day (QID) | ORAL | Status: DC | PRN

## 2017-10-08 MED ORDER — PHENYLEPHRINE 40 MCG/ML (10ML) SYRINGE FOR IV PUSH (FOR BLOOD PRESSURE SUPPORT)
PREFILLED_SYRINGE | INTRAVENOUS | Status: AC
Start: 2017-10-08 — End: ?
  Filled 2017-10-08: qty 10

## 2017-10-08 MED ORDER — GABAPENTIN 100 MG PO CAPS
ORAL_CAPSULE | ORAL | Status: AC
  Filled 2017-10-08: qty 1

## 2017-10-08 MED ORDER — SUGAMMADEX SODIUM 200 MG/2ML IV SOLN
INTRAVENOUS | Status: AC
  Filled 2017-10-08: qty 2

## 2017-10-08 MED ORDER — CEFAZOLIN SODIUM-DEXTROSE 2-4 GM/100ML-% IV SOLN
2.0000 g | INTRAVENOUS | Status: AC
  Administered 2017-10-08: 2 g via INTRAVENOUS

## 2017-10-08 MED ORDER — HYDROMORPHONE HCL 1 MG/ML IJ SOLN
0.5000 mg | INTRAMUSCULAR | Status: DC | PRN
  Administered 2017-10-08 (×2): 0.5 mg via INTRAVENOUS
  Filled 2017-10-08: qty 0.5

## 2017-10-08 MED ORDER — DEXAMETHASONE SODIUM PHOSPHATE 4 MG/ML IJ SOLN
INTRAMUSCULAR | Status: DC | PRN
  Administered 2017-10-08: 10 mg via INTRAVENOUS

## 2017-10-08 MED ORDER — ENOXAPARIN SODIUM 40 MG/0.4ML ~~LOC~~ SOLN
40.0000 mg | SUBCUTANEOUS | Status: DC
  Administered 2017-10-09: 40 mg via SUBCUTANEOUS
  Filled 2017-10-08: qty 0.4

## 2017-10-08 MED ORDER — SCOPOLAMINE 1 MG/3DAYS TD PT72
MEDICATED_PATCH | TRANSDERMAL | Status: AC
Start: 2017-10-08 — End: ?
  Filled 2017-10-08: qty 1

## 2017-10-08 MED ORDER — ONDANSETRON HCL 4 MG/2ML IJ SOLN
INTRAMUSCULAR | Status: AC
  Filled 2017-10-08: qty 2

## 2017-10-08 MED ORDER — ACETAMINOPHEN 160 MG/5ML PO SOLN: 325.0000 mg | ORAL | Status: DC | PRN

## 2017-10-08 MED ORDER — METHYLENE BLUE 0.5 % INJ SOLN
INTRAVENOUS | Status: AC
  Filled 2017-10-08: qty 10

## 2017-10-08 MED ORDER — MIDAZOLAM HCL 2 MG/2ML IJ SOLN
INTRAMUSCULAR | Status: AC
  Filled 2017-10-08: qty 2

## 2017-10-08 MED ORDER — MEPERIDINE HCL 25 MG/ML IJ SOLN: 6.2500 mg | INTRAMUSCULAR | Status: DC | PRN

## 2017-10-08 MED ORDER — EPHEDRINE SULFATE 50 MG/ML IJ SOLN
INTRAMUSCULAR | Status: DC | PRN
  Administered 2017-10-08: 150 mg via INTRAVENOUS

## 2017-10-08 MED ORDER — LACTATED RINGERS IV SOLN
INTRAVENOUS | Status: DC
  Administered 2017-10-08 (×3): via INTRAVENOUS

## 2017-10-08 MED ORDER — CEFAZOLIN SODIUM-DEXTROSE 2-4 GM/100ML-% IV SOLN
INTRAVENOUS | Status: AC
  Filled 2017-10-08: qty 100

## 2017-10-08 MED ORDER — KETOROLAC TROMETHAMINE 30 MG/ML IJ SOLN
30.0000 mg | Freq: Three times a day (TID) | INTRAMUSCULAR | Status: AC
  Administered 2017-10-08 – 2017-10-09 (×3): 30 mg via INTRAVENOUS
  Filled 2017-10-08 (×3): qty 1

## 2017-10-08 MED ORDER — EPHEDRINE SULFATE 50 MG/ML IJ SOLN
INTRAMUSCULAR | Status: AC
Start: 2017-10-08 — End: ?
  Filled 2017-10-08: qty 1

## 2017-10-08 MED ORDER — METHOCARBAMOL 500 MG PO TABS
500.0000 mg | ORAL_TABLET | Freq: Four times a day (QID) | ORAL | Status: DC | PRN
  Administered 2017-10-08 – 2017-10-09 (×2): 500 mg via ORAL
  Filled 2017-10-08 (×2): qty 1

## 2017-10-08 MED ORDER — DEXMEDETOMIDINE HCL IN NACL 200 MCG/50ML IV SOLN
INTRAVENOUS | Status: DC | PRN
  Administered 2017-10-08: 30 ug via INTRAVENOUS

## 2017-10-08 MED ORDER — HYDROCODONE-ACETAMINOPHEN 5-325 MG PO TABS
1.0000 | ORAL_TABLET | ORAL | Status: DC | PRN
  Administered 2017-10-08: 1 via ORAL
  Administered 2017-10-08 – 2017-10-09 (×3): 2 via ORAL
  Filled 2017-10-08 (×2): qty 2
  Filled 2017-10-08: qty 1
  Filled 2017-10-08: qty 2

## 2017-10-08 MED ORDER — CEFAZOLIN SODIUM-DEXTROSE 1-4 GM/50ML-% IV SOLN
1.0000 g | Freq: Three times a day (TID) | INTRAVENOUS | Status: DC
  Administered 2017-10-08 – 2017-10-09 (×2): 1 g via INTRAVENOUS
  Filled 2017-10-08 (×2): qty 50

## 2017-10-08 MED ORDER — SODIUM CHLORIDE 0.9 % IJ SOLN
INTRAMUSCULAR | Status: AC
  Filled 2017-10-08: qty 10

## 2017-10-08 MED ORDER — DEXAMETHASONE SODIUM PHOSPHATE 10 MG/ML IJ SOLN
INTRAMUSCULAR | Status: AC
  Filled 2017-10-08: qty 1

## 2017-10-08 MED ORDER — GABAPENTIN 300 MG PO CAPS
300.0000 mg | ORAL_CAPSULE | ORAL | Status: AC
  Administered 2017-10-08: 300 mg via ORAL

## 2017-10-08 MED ORDER — SUGAMMADEX SODIUM 500 MG/5ML IV SOLN
INTRAVENOUS | Status: AC
  Filled 2017-10-08: qty 5

## 2017-10-08 MED ORDER — SUCCINYLCHOLINE CHLORIDE 200 MG/10ML IV SOSY
PREFILLED_SYRINGE | INTRAVENOUS | Status: AC
  Filled 2017-10-08: qty 10

## 2017-10-08 MED ORDER — KCL IN DEXTROSE-NACL 20-5-0.45 MEQ/L-%-% IV SOLN
INTRAVENOUS | Status: DC
  Administered 2017-10-08: 13:00:00 via INTRAVENOUS
  Filled 2017-10-08: qty 1000

## 2017-10-08 MED ORDER — SODIUM CHLORIDE 0.9 % IJ SOLN
INTRAMUSCULAR | Status: DC | PRN
  Administered 2017-10-08: 5 mL

## 2017-10-08 MED ORDER — ACETAMINOPHEN 325 MG PO TABS: 325.0000 mg | ORAL_TABLET | ORAL | Status: DC | PRN

## 2017-10-08 MED ORDER — ONDANSETRON HCL 4 MG/2ML IJ SOLN: 4.0000 mg | Freq: Four times a day (QID) | INTRAMUSCULAR | Status: DC | PRN

## 2017-10-08 MED ORDER — LORAZEPAM 0.5 MG PO TABS: 0.5000 mg | ORAL_TABLET | Freq: Every evening | ORAL | Status: DC | PRN

## 2017-10-08 MED ORDER — HYDROMORPHONE HCL 1 MG/ML IJ SOLN
INTRAMUSCULAR | Status: AC
Start: 2017-10-08 — End: ?
  Filled 2017-10-08: qty 0.5

## 2017-10-08 MED ORDER — ACETAMINOPHEN 500 MG PO TABS
1000.0000 mg | ORAL_TABLET | ORAL | Status: AC
  Administered 2017-10-08: 1000 mg via ORAL

## 2017-10-08 MED ORDER — ACETAMINOPHEN 500 MG PO TABS
ORAL_TABLET | ORAL | Status: AC
  Filled 2017-10-08: qty 2

## 2017-10-08 MED ORDER — OXYCODONE HCL 5 MG PO TABS: 5.0000 mg | ORAL_TABLET | Freq: Once | ORAL | Status: DC | PRN

## 2017-10-08 MED ORDER — SUGAMMADEX SODIUM 200 MG/2ML IV SOLN
INTRAVENOUS | Status: DC | PRN
  Administered 2017-10-08: 150 mg via INTRAVENOUS

## 2017-10-08 MED ORDER — FENTANYL CITRATE (PF) 100 MCG/2ML IJ SOLN
50.0000 ug | INTRAMUSCULAR | Status: AC | PRN
  Administered 2017-10-08: 50 ug via INTRAVENOUS
  Administered 2017-10-08: 25 ug via INTRAVENOUS
  Administered 2017-10-08: 100 ug via INTRAVENOUS

## 2017-10-08 SURGICAL SUPPLY — 73 items
ADH SKN CLS APL DERMABOND .7 (GAUZE/BANDAGES/DRESSINGS) ×2
ALLODERM CONTOUR PERF LRG RTU (Tissue) ×4 IMPLANT
ALLODERM LG CONTOUR PERF RTU (Tissue) ×8 IMPLANT
APPLIER CLIP 11 MED OPEN (CLIP)
APPLIER CLIP 9.375 MED OPEN (MISCELLANEOUS) ×3
APR CLP MED 11 20 MLT OPN (CLIP)
APR CLP MED 9.3 20 MLT OPN (MISCELLANEOUS) ×1
BINDER BREAST LRG (GAUZE/BANDAGES/DRESSINGS) IMPLANT
BINDER BREAST MEDIUM (GAUZE/BANDAGES/DRESSINGS) IMPLANT
BINDER BREAST XLRG (GAUZE/BANDAGES/DRESSINGS) ×2 IMPLANT
BINDER BREAST XXLRG (GAUZE/BANDAGES/DRESSINGS) IMPLANT
BLADE SURG 10 STRL SS (BLADE) ×3 IMPLANT
BLADE SURG 15 STRL LF DISP TIS (BLADE) ×1 IMPLANT
BLADE SURG 15 STRL SS (BLADE) ×3
CANISTER SUCT 1200ML W/VALVE (MISCELLANEOUS) ×5 IMPLANT
CHLORAPREP W/TINT 26ML (MISCELLANEOUS) ×6 IMPLANT
CLIP APPLIE 11 MED OPEN (CLIP) IMPLANT
CLIP APPLIE 9.375 MED OPEN (MISCELLANEOUS) IMPLANT
COVER BACK TABLE 60X90IN (DRAPES) ×3 IMPLANT
COVER MAYO STAND STRL (DRAPES) ×5 IMPLANT
COVER PROBE W GEL 5X96 (DRAPES) ×3 IMPLANT
DERMABOND ADVANCED (GAUZE/BANDAGES/DRESSINGS) ×4
DERMABOND ADVANCED .7 DNX12 (GAUZE/BANDAGES/DRESSINGS) ×2 IMPLANT
DRAIN CHANNEL 15F RND FF W/TCR (WOUND CARE) IMPLANT
DRAIN CHANNEL 19F RND (DRAIN) ×8 IMPLANT
DRAPE LAPAROSCOPIC ABDOMINAL (DRAPES) ×2 IMPLANT
DRAPE TOP ARMCOVERS (MISCELLANEOUS) ×1 IMPLANT
DRAPE U-SHAPE 76X120 STRL (DRAPES) ×1 IMPLANT
DRAPE UTILITY 15X26 TOWEL STRL (DRAPES) ×3 IMPLANT
DRSG PAD ABDOMINAL 8X10 ST (GAUZE/BANDAGES/DRESSINGS) ×6 IMPLANT
DRSG TEGADERM 4X10 (GAUZE/BANDAGES/DRESSINGS) ×4 IMPLANT
ELECT BLADE 4.0 EZ CLEAN MEGAD (MISCELLANEOUS) ×3
ELECT COATED BLADE 2.86 ST (ELECTRODE) ×3 IMPLANT
ELECT REM PT RETURN 9FT ADLT (ELECTROSURGICAL) ×3
ELECTRODE BLDE 4.0 EZ CLN MEGD (MISCELLANEOUS) ×1 IMPLANT
ELECTRODE REM PT RTRN 9FT ADLT (ELECTROSURGICAL) ×1 IMPLANT
EVACUATOR SILICONE 100CC (DRAIN) IMPLANT
EXPANDER BREAST TISSUE 400CC (Breast) ×4 IMPLANT
FILTER 7/8 IN (FILTER) ×3 IMPLANT
GLOVE SURG SIGNA 7.5 PF LTX (GLOVE) ×3 IMPLANT
GOWN STRL REUS W/ TWL LRG LVL3 (GOWN DISPOSABLE) ×3 IMPLANT
GOWN STRL REUS W/TWL LRG LVL3 (GOWN DISPOSABLE) ×9
NDL HYPO 25X1 1.5 SAFETY (NEEDLE) IMPLANT
NDL SAFETY ECLIPSE 18X1.5 (NEEDLE) IMPLANT
NEEDLE HYPO 18GX1.5 SHARP (NEEDLE)
NEEDLE HYPO 25X1 1.5 SAFETY (NEEDLE) ×3 IMPLANT
NS IRRIG 1000ML POUR BTL (IV SOLUTION) ×3 IMPLANT
PACK BASIN DAY SURGERY FS (CUSTOM PROCEDURE TRAY) ×3 IMPLANT
PENCIL BUTTON HOLSTER BLD 10FT (ELECTRODE) ×3 IMPLANT
PIN SAFETY STERILE (MISCELLANEOUS) ×3 IMPLANT
SHEET MEDIUM DRAPE 40X70 STRL (DRAPES) ×6 IMPLANT
SLEEVE SCD COMPRESS KNEE MED (MISCELLANEOUS) ×3 IMPLANT
SPONGE LAP 18X18 RF (DISPOSABLE) ×9 IMPLANT
STAPLER VISISTAT 35W (STAPLE) ×3 IMPLANT
SUT CHROMIC 4 0 PS 2 18 (SUTURE) ×16 IMPLANT
SUT ETHILON 2 0 FS 18 (SUTURE) ×4 IMPLANT
SUT MNCRL AB 4-0 PS2 18 (SUTURE) ×10 IMPLANT
SUT SILK 2 0 SH (SUTURE) ×3 IMPLANT
SUT VIC AB 3-0 SH 27 (SUTURE) ×12
SUT VIC AB 3-0 SH 27X BRD (SUTURE) IMPLANT
SUT VICRYL 0 CT-2 (SUTURE) ×8 IMPLANT
SUT VICRYL 3-0 CR8 SH (SUTURE) IMPLANT
SUT VICRYL 4-0 PS2 18IN ABS (SUTURE) ×4 IMPLANT
SUT VLOC 180 0 24IN GS25 (SUTURE) ×4 IMPLANT
SYR CONTROL 10ML LL (SYRINGE) ×3 IMPLANT
TISSUE ALLDRM CONTOUR LRG MED (Tissue) IMPLANT
TOWEL OR 17X24 6PK STRL BLUE (TOWEL DISPOSABLE) ×6 IMPLANT
TRAY FOL W/BAG SLVR 16FR STRL (SET/KITS/TRAYS/PACK) IMPLANT
TRAY FOLEY W/BAG SLVR 16FR LF (SET/KITS/TRAYS/PACK) ×3
TUBE CONNECTING 20'X1/4 (TUBING) ×2
TUBE CONNECTING 20X1/4 (TUBING) ×3 IMPLANT
VAC PENCILS W/TUBING CLEAR (MISCELLANEOUS) ×2 IMPLANT
YANKAUER SUCT BULB TIP NO VENT (SUCTIONS) ×3 IMPLANT

## 2017-10-08 NOTE — Op Note (Signed)
10/08/2017  9:31 AM  PATIENT:  Jennifer Beck, 52 y.o., female, MRN: [id number]  PREOP DIAGNOSIS:  RIGHT BREAST CANCER  POSTOP DIAGNOSIS:   Right breast cancer, 12:30 o'clock position (T1, N0)  PROCEDURE:   Procedure(s):  RIGHT MASTECTOMY WITH RIGHT AXILLARY SENTINEL LYMPH NODE BIOPSY AND LEFT PROPHYLACTIC MASTECTOMY BREAST RECONSTRUCTION WITH PLACEMENT OF TISSUE EXPANDER AND ALLODERM, right axillary deep sentinel lymph node biopsy  SURGEON:   [name], M.D.  ASSISTANT:   B. Iran Planas, M.D.  ANESTHESIA:   general  Anesthesiologist: Janeece Riggers, MD CRNA: Marrianne Mood, CRNA; Willa Frater, CRNA  General  ASA:  2  EBL:  100  ml  BLOOD ADMINISTERED: none  DRAINS: none   LOCAL MEDICATIONS USED:   Right pectoral block by anesthesia, left pectoral block - 30 cc 1/4% marcaine by Dr. Iran Planas.  SPECIMEN:   Left breast (suture lateral), right breast (suture lateral), right axillary sentinel lymph node biopsy (counts - 700, blue, background - 5)  COUNTS CORRECT:  YES  INDICATIONS FOR PROCEDURE:  Jennifer Beck is a 52 y.o. (DOB: [birth year]) white female whose primary care physician is Schoenhoff, Altamese Cabal, MD and comes for bilateral mastectomies and right axillary sentinel lymph node biopsy.     She saw Drs. Magrinat and Isidore Moos at the [facility].  She has right multifocal triple negative breast cancer.  She has undergone neoadjuvant chemotx by Dr. Jana Hakim.  She has a ALH of the left breast.  She has elected to undergo a left prophylactic mastectomy and right mastectomy with right axillary SLNBx.  She has seen Dr. Iran Planas who will perform breast reconstruction.    The indications and risks of the surgery were explained to the patient.  The risks include, but are not limited to, infection, bleeding, and nerve injury.   In the holding area, her right areola was injected with 1 millicurie of Technitium Sulfur Colloid.  OPERATIVE NOTE;  The patient  was taken to room # 7 at [facility] where she underwent a general anesthesia  supervised by Anesthesiologist: Janeece Riggers, MD CRNA: Marrianne Mood, CRNA; Willa Frater, CRNA. Both her breast and axilla were prepped with ChloraPrep and sterilely draped.    A time-out and the surgical check list was reviewed.    I injected about 0.5 mL of 40% methylene blue around her right areola.     I started on the left mastectomy.  Dr. Iran Planas drew out a triangular excision around the left nipple to incorporate in her reconstruction incision.  I developed skin flaps medially to the lateral edge of the sternum, inferiorly to the investing fascia of the rectus abdominus muscle, laterally to the anterior edge of the latissimus dorsi muscle, and superiorly to about 2 finger breaths below the clavicle.  The breast was reflected off the pectoralis muscle from medial to lateral.  The lateral attachments in the left axilla were divided and the breast removed.  A long suture was placed on the lateral aspect of the breast.   I then did the right mastectomy.   Dr. Iran Planas drew out a triangular excision around the right nipple to incorporate in her reconstruction incision.  I developed skin flaps medially to the lateral edge of the sternum, inferiorly to the investing fascia of the rectus abdominus muscle, laterally to the anterior edge of the latissimus dorsi muscle, and superiorly to about 2 finger breaths below the clavicle.  The breast was reflected off the pectoralis muscle from medial  to lateral.  The lateral attachments in the right axilla were divided and the breast removed.  A long suture was placed on the lateral aspect of the breast.   I dissected into the right axilla and found a deep sentinel lymph node.  The node had counts of 700 with a background count of 5.  The lymph node was blue.  This was sent as a separate specimen.   Dr. Iran Planas had scrubbed and helped with the right mastectomy and  SLNBx.  I have a surgeon as a first assist to retract, expose, and assist on this difficult operation.   Dr. Iran Planas then started the reconstruction after both mastectomies were complete.  [name], MD, [facility] Pager: [phone] Office phone:  [phone]

## 2017-10-08 NOTE — Op Note (Signed)
Operative Note   DATE OF OPERATION: 5.14.19  LOCATION: [facility]-observatio  SURGICAL DIVISION: Plastic Surgery  PREOPERATIVE DIAGNOSES:  1. Right breast cancer UOQ ER- 2. Neoadjuvant chemotherapy  POSTOPERATIVE DIAGNOSES:  same  PROCEDURE:  1. Bilateral breast reconstruction with tissue expanders 2. Acellular dermis (Alloderm) for breast reconstruction 600 cm2  SURGEON: [name] MD MBA  ASSISTANT: none  ANESTHESIA:  General.   EBL: 105 ml for entire procedure  COMPLICATIONS: None immediate.   INDICATIONS FOR PROCEDURE:  The patient, Jennifer Beck, is a 52 y.o. female born on [birth year], is here for immediate prepectoral expander, ADM breast reconstruction following skin reduction pattern mastectomies.   FINDINGS: Natrelle 133FV-12-T 400 ml tissue expander placed bilateral, initial fill volume 320 ml air. RIGHT SN [id number] LEFT [id number]  DESCRIPTION OF PROCEDURE:  The patient was marked with the patient in the preoperative area to mark sternal notch, chest midline, anterior axillary lines and inframammary folds. Patient was marked for skin reduction mastectomy with most superior portion nipple areola marked on breast meridian. Vertical limbs marked by breast displacement and set at 8 cm length. The patient was taken to the operating room. SCDs were placed and IV antibiotics were given. Foley catheter placed. The patient's operative site was prepped and draped in a sterile fashion. A time out was performed and all information was confirmed to be correct. In supine position, the lateral limbs for resection marked and area over lower pole preserved as inferiorly based dermal pedicle. Skin de epithelialized in this area. I assisted in mastectomies and sentinel node dissectionwith retraction and exposure.Following completion of mastectomies, reconstruction began on left side.  The cavity was irrigated with solution containing Ancef, bacitracin, and gentamicin.  Hemostasis was ensured. A 19 Fr drain was placed in subcutaneous position laterally anda 15 Fr drain placed along inframammary fold. Eachsecured to skin with 2-0 nylon. Cavity irrigated with Betadine.The tissue expanderswere prepared on back table prior in insertion. The expander was filled with air to 320 ml. Perforated acellular dermis was  draped over anterior surface expander. The ADM was then secured to itself over posterior surface of expander. Redundant folds acellular dermis excised so that the ADM lied flat without folds over air filled expander.The expander was secured to medial insertion pectoralis with a 0 vicryl.The superior and lateral tabs also secured to pectoralis muscle with 0-vicryl. The ADM was secured to pectoralis muscle and chest Schroeter along inferior border at inframammary fold.Laterally the mastectomy flap over posterior axillary line was advanced anteriorly and the subcutaneous tissue and superficial fascia was secured to pectoralis muscle and acellular dermis with 0-vicryl. The inferiorly based dermal pedicle was redraped superiorly over expander and acellular dermis and secured to pectoralis with interrupted 0-vicryl. Skin closure completedwith 3-0 vicryl in fascial layer and 4-0 vicryl in dermis. Skin closure completed with 4-0 monocryl subcuticular and tissue adhesive.  I then directed my attention to right chest where similar irrigation and drain placement completed. The prepared expander with ADM secured over anterior surface was placed in right chest and tabs secured to chest Kolodziej and pectoralis muscle with 0- vicryl suture. The acellular dermis at inframammary fold was secured to chest Taormina with 0 V-lock suture.Laterally the mastectomy flap over posterior axillary line was advanced anteriorly and the subcutaneous tissue and superficial fascia was secured to pectoralis muscle and acellular dermis with 0-vicryl. The inferiorly based dermal pedicle was redraped superiorly  over expander and acellular dermis and secured to pectoralis with interrupted 0-vicryl. Skin closure completedwith 3-0 vicryl  in fascial layer and 4-0 vicryl in dermis. Skin closure completed with 4-0 monocryl subcuticular and tissue adhesive. Tegaderms applied bilateral, followed by dry dressing and breast binder.  The patient was allowed to wake from anesthesia, extubated and taken to the recovery room in satisfactory condition.   SPECIMENS: none  DRAINS: 15 and 19 Fr JP in right and left breast reconstruction  [name], MD [facility] Plastic & Reconstructive Surgery [phone], pin [phone]

## 2017-10-08 NOTE — Anesthesia Procedure Notes (Addendum)
Anesthesia Regional Block: Pectoralis block   Pre-Anesthetic Checklist: ,, timeout performed, Correct Patient, Correct Site, Correct Laterality, Correct Procedure, Correct Position, site marked, Risks and benefits discussed,  Surgical consent,  Pre-op evaluation,  At surgeon's request and post-op pain management  Laterality: Right  Prep: chloraprep       Needles:  Injection technique: Single-shot  Needle Type: Echogenic Stimulator Needle     Needle Length: 5cm  Needle Gauge: 22     Additional Needles:   Procedures:, nerve stimulator,,, ultrasound used (permanent image in chart),,,,  Narrative:  Start time: 10/08/2017 7:24 AM End time: 10/08/2017 7:30 AM Injection made incrementally with aspirations every 5 mL.  Performed by: Personally  Anesthesiologist: Janeece Riggers, MD  Additional Notes: Functioning IV was confirmed and monitors were applied.  A 15mm 22ga Arrow echogenic stimulator needle was used. Sterile prep and drape,hand hygiene and sterile gloves were used. Ultrasound guidance: relevant anatomy identified, needle position confirmed, local anesthetic spread visualized around nerve(s)., vascular puncture avoided.  Image printed for medical record. Negative aspiration and negative test dose prior to incremental administration of local anesthetic. The patient tolerated the procedure well.

## 2017-10-08 NOTE — Transfer of Care (Signed)
Immediate Anesthesia Transfer of Care Note  Patient: Jennifer Beck  Procedure(s) Performed: RIGHT MASTECTOMY WITH RIGHT AXILLARY SENTINEL LYMPH NODE BIOPSY AND LEFT PROPHYLACTIC MASTECTOMY (Bilateral Breast) BREAST RECONSTRUCTION WITH PLACEMENT OF TISSUE EXPANDER AND ALLODERM (Bilateral Breast)  Patient Location: PACU  Anesthesia Type:GA combined with regional for post-op pain  Level of Consciousness: awake, alert , oriented and drowsy  Airway & Oxygen Therapy: Patient Spontanous Breathing and Patient connected to face mask oxygen  Post-op Assessment: Report given to RN and Post -op Vital signs reviewed and stable  Post vital signs: Reviewed and stable  Last Vitals:  Vitals Value Taken Time  BP 107/51 10/08/2017 11:22 AM  Temp    Pulse 107 10/08/2017 11:25 AM  Resp    SpO2 100 % 10/08/2017 11:25 AM  Vitals shown include unvalidated device data.  Last Pain:  Vitals:   10/08/17 0716  TempSrc:   PainSc: 0-No pain      Patients Stated Pain Goal: 3 (23/95/32 0233)  Complications: No apparent anesthesia complications

## 2017-10-08 NOTE — Anesthesia Procedure Notes (Signed)

## 2017-10-08 NOTE — H&P (Signed)
Subjective:     Patient ID: Jennifer Beck is a 52 y.o. female.  HPI  Here for follow up discussion prior to planned bilateral breast reconstruction. Presented with palpable mass right breast. MMG/US demonstrated indeterminate appearing breast mass at 12:30, 4 cmfn.No evidence of right axillary lymphadenopathy was noted. Biopsy showed IDC with LVI, triple negative. MRI showed known malignancy involving the Right UIQ of measuring 1.8 x 1.7 x 2.5 cm. Two indeterminate foci of LOQ right breast noted, overall span of NME of LOQ measured 7 cm. Addition focus of NME of LEFT UOQ and UIQ noted. Additional biopsies labeled right LIQ with invasive mammary carcinoma ER/PR+, Her2- and right LOQ read as mammary carcinoma in situ. Biopsy LEFT UOQ with ALH and LEFT UIQ with hemorrhage.  Completed neoadjuvant chemotherapy. Final MRI with interval decrease, 1 cm mass in right UIQ that previously measured 2.5 cm, resolution other areas of enhancement.  Genetics negative.  She has elected for bilateral mastectomies.  Current B/C cup, desires no larger but lifted. Wt stable.  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 Can work from home if needed. Her husband [name] is a Airline pilot   Objective:   Physical Exam  CV: normal heart sounds PULM: clear to ausculatation  Right chest port Grade 1 ptosis SN to nipple R 26.5 L 27 BW R 16 L 17 cm (CW 13 cm) Nipple to IMF R 8 L 8 cm    Assessment:     Right breast ca UOQ ER- Neoadjuvant chemotherapy    Plan:     Plan bilateral mastectomies with immediate TE, ADM reconstruction. Reviewed incisions, drains, OR length, hospital stay and post operative limitations. Discussed process of expansion and implant based risks including rupture, surveillance for silicone implants, infection requiring surgery or removal, contracture.   Discussed use of acellular dermis in reconstruction, cadaveric source, incorporation over several  weeks, risk that if has seroma or infection can act as additional nidus for infection if not incorporated.  Discussed prepectoral vs sub pectoral reconstruction. Discussed with patient and benefit of this is no animation deformity, may be less pain. Risk may be more visible rippling over upper poles, greater need of ADM. Reviewed pre pectoral would require larger amount acellular dermis, more drains. Discussed any type reconstruction also risks long term displacement implant and visible rippling. If prepectoral counseled I would recommend she be comfortable with silicone implants as more options that have less rippling. She agrees to prepectoral placement.  Has concerns about recurrence with NSM. Reviewed data that recurrence same with NSM vs traditional mastectomy. Reviewed NSM will be asensate. Reviewed portfolio picture of 3d tattoo, NSM with varying degrees preop ptosis. She is concerned about nipples being in wrong place, desires lifted appearence. We agreed to proceed with skin reduction pattern mastectomies, resection NAC, anchor type scar.   Rx for oxycodone, bactrim, and robaxin given.   [name], MD [facility] Plastic & Reconstructive Surgery [phone], pin [phone]

## 2017-10-08 NOTE — Interval H&P Note (Signed)
History and Physical Interval Note:  10/08/2017 6:59 AM  Jennifer Beck  has presented today for surgery, with the diagnosis of RIGHT BREAST CANCER  The various methods of treatment have been discussed with the patient and family.  Husband here with patient.  After consideration of risks, benefits and other options for treatment, the patient has consented to  Procedure(s): RIGHT MASTECTOMY WITH RIGHT AXILLARY SENTINEL LYMPH NODE BIOPSY AND LEFT PROPHYLACTIC MASTECTOMY (Bilateral) BREAST RECONSTRUCTION WITH PLACEMENT OF TISSUE EXPANDER AND ALLODERM (Bilateral) as a surgical intervention .  The patient's history has been reviewed, patient examined, no change in status, stable for surgery.  I have reviewed the patient's chart and labs.  Questions were answered to the patient's satisfaction.     Shann Medal

## 2017-10-08 NOTE — Progress Notes (Signed)
Assisted Dr. Oddono with right, ultrasound guided, pectoralis block. Side rails up, monitors on throughout procedure. See vital signs in flow sheet. Tolerated Procedure well. 

## 2017-10-08 NOTE — Anesthesia Postprocedure Evaluation (Signed)
Anesthesia Post Note  Patient: Jennifer Beck  Procedure(s) Performed: RIGHT MASTECTOMY WITH RIGHT AXILLARY SENTINEL LYMPH NODE BIOPSY AND LEFT PROPHYLACTIC MASTECTOMY (Bilateral Breast) BREAST RECONSTRUCTION WITH PLACEMENT OF TISSUE EXPANDER AND ALLODERM (Bilateral Breast)     Patient location during evaluation: PACU Anesthesia Type: General Level of consciousness: awake and alert Pain management: pain level controlled Vital Signs Assessment: post-procedure vital signs reviewed and stable Respiratory status: spontaneous breathing, nonlabored ventilation, respiratory function stable and patient connected to nasal cannula oxygen Cardiovascular status: blood pressure returned to baseline and stable Postop Assessment: no apparent nausea or vomiting Anesthetic complications: no    Last Vitals:  Vitals:   10/08/17 0735 10/08/17 1123  BP: 132/82   Pulse: 99 (!) 113  Resp: 12   Temp:    SpO2: 99% 99%    Last Pain:  Vitals:   10/08/17 0716  TempSrc:   PainSc: 0-No pain                 [name]

## 2017-10-09 ENCOUNTER — Encounter (HOSPITAL_BASED_OUTPATIENT_CLINIC_OR_DEPARTMENT_OTHER): Payer: Self-pay | Admitting: Surgery

## 2017-10-09 DIAGNOSIS — Z9012 Acquired absence of left breast and nipple: Secondary | ICD-10-CM | POA: Diagnosis not present

## 2017-10-09 DIAGNOSIS — C50411 Malignant neoplasm of upper-outer quadrant of right female breast: Secondary | ICD-10-CM | POA: Diagnosis not present

## 2017-10-09 DIAGNOSIS — C50911 Malignant neoplasm of unspecified site of right female breast: Secondary | ICD-10-CM | POA: Diagnosis not present

## 2017-10-09 MED ORDER — HEPARIN SOD (PORK) LOCK FLUSH 100 UNIT/ML IV SOLN
500.0000 [IU] | INTRAVENOUS | Status: AC | PRN
  Administered 2017-10-09: 500 [IU]

## 2017-10-09 NOTE — Discharge Instructions (Signed)
**2 Norco given at 5:56am**   Post Anesthesia Home Care Instructions  Activity: Get plenty of rest for the remainder of the day. A responsible individual must stay with you for 24 hours following the procedure.  For the next 24 hours, DO NOT: -Drive a car -Paediatric nurse -Drink alcoholic beverages -Take any medication unless instructed by your physician -Make any legal decisions or sign important papers.  Meals: Start with liquid foods such as gelatin or soup. Progress to regular foods as tolerated. Avoid greasy, spicy, heavy foods. If nausea and/or vomiting occur, drink only clear liquids until the nausea and/or vomiting subsides. Call your physician if vomiting continues.  Special Instructions/Symptoms: Your throat may feel dry or sore from the anesthesia or the breathing tube placed in your throat during surgery. If this causes discomfort, gargle with warm salt water. The discomfort should disappear within 24 hours.  If you had a scopolamine patch placed behind your ear for the management of post- operative nausea and/or vomiting:  1. The medication in the patch is effective for 72 hours, after which it should be removed.  Wrap patch in a tissue and discard in the trash. Wash hands thoroughly with soap and water. 2. You may remove the patch earlier than 72 hours if you experience unpleasant side effects which may include dry mouth, dizziness or visual disturbances. 3. Avoid touching the patch. Wash your hands with soap and water after contact with the patch.  About my Jackson-Pratt Bulb Drain  What is a Jackson-Pratt bulb? A Jackson-Pratt is a soft, round device used to collect drainage. It is connected to a long, thin drainage catheter, which is held in place by one or two small stiches near your surgical incision site. When the bulb is squeezed, it forms a vacuum, forcing the drainage to empty into the bulb.  Emptying the Jackson-Pratt bulb- To empty the bulb: 1. Release  the plug on the top of the bulb. 2. Pour the bulb's contents into a measuring container which your nurse will provide. 3. Record the time emptied and amount of drainage. Empty the drain(s) as often as your     doctor or nurse recommends.  Date                  Time                    Amount (Drain 1)                 Amount (Drain 2)  _____________________________________________________________________  _____________________________________________________________________  _____________________________________________________________________  _____________________________________________________________________  _____________________________________________________________________  _____________________________________________________________________  _____________________________________________________________________  _____________________________________________________________________  Squeezing the Jackson-Pratt Bulb- To squeeze the bulb: 1. Make sure the plug at the top of the bulb is open. 2. Squeeze the bulb tightly in your fist. You will hear air squeezing from the bulb. 3. Replace the plug while the bulb is squeezed. 4. Use a safety pin to attach the bulb to your clothing. This will keep the catheter from     pulling at the bulb insertion site.  When to call your doctor- Call your doctor if:  Drain site becomes red, swollen or hot.  You have a fever greater than 101 degrees F.  There is oozing at the drain site.  Drain falls out (apply a guaze bandage over the drain hole and secure it with tape).  Drainage increases daily not related to activity patterns. (You will usually have more drainage when you are active than when you  are resting.)  Drainage has a bad odor.

## 2017-10-09 NOTE — Addendum Note (Signed)
Addendum  created 10/09/17 1100 by Janeece Riggers, MD   Intraprocedure Blocks edited, Sign clinical note

## 2017-10-16 DIAGNOSIS — Z9013 Acquired absence of bilateral breasts and nipples: Secondary | ICD-10-CM | POA: Insufficient documentation

## 2017-10-17 NOTE — Progress Notes (Signed)
[facility]  [phone] [phone]     ID: Jennifer Beck DOB: 11-07-1965  MR#: [id number]  MLY#:[id number]  Patient Care Team: [name], MD as PCP - General (Internal Medicine) [name], MD as Consulting Physician (Dermatology) [name], DC as Referring Physician (Chiropractic Medicine) [name], MD as Consulting Physician (General Surgery) [name], MD as Attending Physician (Radiation Oncology) [name], MD as Consulting Physician (Obstetrics and Gynecology) Magrinat, Virgie Dad, MD as Consulting Physician (Oncology) [name], MD as Consulting Physician (Plastic Surgery) OTHER MD:  CHIEF COMPLAINT: Triple negative breast cancer  CURRENT TREATMENT: Neoadjuvant chemotherapy  INTERVAL HISTORY: Jennifer Beck returns today for a follow-up and treatment of her triple negative breast cancer accompanied by her husband.  Since her last visit here she underwent bilateral mastectomies with immediate expander placement.  The pathology from this procedure ([id number]) showed only atypical lobular hyperplasia in the left breast.  In the right breast there was residual invasive ductal carcinoma measuring 1.0 cm.  This was grade 3.  Margins were negative and ample.  Both sentinel lymph nodes were clear    REVIEW OF SYSTEMS:  Jennifer Beck did well with the surgery, with well-controlled pain.  She is using Norco occasionally and also Tylenol and Advil.  She was minimally constipated.  She is having regular bowel movements at present with the help of stool softeners.  She had to drains removed and still has 1 drain on each side, but very scant production, about 20 cc/day in the bulb by her measurement.  There has been no fever, no rash, no bleeding, no unusual headaches, visual changes, cough, phlegm production, pleurisy, shortness of breath, or change in bowel or bladder habits.  There has been no peripheral edema.  A  detailed review of systems today was stable except as noted   HISTORY OF CURRENT ILLNESS:  From the original intake note:  Jennifer Beck palpated a mass in her right breast sometime in September 2018.[name] She brought this to medical attention and her PCP, [name], set her up on 03/18/2017 for a unilateral right diagnostic mammography with ultrasonography, showing: Breast density D. The palpable right breast mass at 12:30 was indeterminate, and biopsy was warranted at that time. No evidence of right axillary lymphadenopathy was noted. Biopsy of the lesion in question on 03/21/2017 at the right breast 12:30 position showed ([id number])  invasive ductal carcinoma with lymphovascular invasion.  The tumor was grade 3, triple negative, with an MIB-1 of 80%.  She is accompanied by her husband, Jennifer Beck to the office today. She reports that she is doing well overall. She initially felt a lump in September in the shower and notes that she doesn't complete monthly self breast exams. She had a routine mammogram in March 2018 at [facility] and she is followed by Dr. [name].  As far as surgeries, she has had two laparoscopic procedures for endometriosis as well as a bilateral tubal ligation. She still has occasional cramping, painful ovulation, and vaginal spotting that comes and goes. She has had cyst to her breast before that have been evaluated and ruled as negative. She notes that she still has her tonsils, gallbladder, and appendix. She has a prior hx of pericarditis in 2003 that she notes was brought onby a virus. She has since been cleared by her prior Cardiologist.   She has a Restaurant manager, fast food, [name] that she sees every 5 weeks for maintenance of her neck and back issues.  She has a dermatologist, [name] at [facility] and she has had an biopsy of an area from her left chest that resulted as basal cell carcinoma in 2015. She denies having a GI specialist,  Cardiologist, or Orthopedist at this time. She denies prior history of seizures, migraines, acid reflux, asthma, emphysema, palpitations or GI issues.   [name] is as detailed below.  PAST MEDICAL HISTORY: Past Medical History:  Diagnosis Date  . Arthritis   . Chest pain   . Complication of anesthesia   . Endometriosis   . Family history of breast cancer 04/22/2017  . Family history of colon cancer 04/22/2017  . Family history of kidney cancer 04/22/2017  . Fatigue   . GERD (gastroesophageal reflux disease)   . GI bleed    caused by ischemic colitis following an episode of hypertension  . Heart palpitations   . Hx of echocardiogram    a. echo 9/13:  EF 55-60%, Gr 2 diast dysfn  . Hypercholesterolemia   . Ischemic colitis ([place])   . 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 Myocarditis ([place])   . Pericarditis   . PONV (postoperative nausea and vomiting)   . SOB (shortness of breath)     PAST SURGICAL HISTORY: Past Surgical History:  Procedure Laterality Date  . BREAST RECONSTRUCTION WITH PLACEMENT OF TISSUE EXPANDER AND ALLODERM Bilateral 10/08/2017   Procedure: BREAST RECONSTRUCTION WITH PLACEMENT OF TISSUE EXPANDER AND ALLODERM;  Surgeon: [name], MD;  Location: [place];  Service: Plastics;  Laterality: Bilateral;  . CARDIAC CATHETERIZATION  08/26/2001   EF of 60% --- smooth & normal coronary arteries -- there is a Antillon motion defect consistent with posterolateral MI -- she quite possibly had a coronary spasm -- she may have some pericarditis secondary to her MI   . DILATION AND CURETTAGE OF UTERUS  10/15/2003   Uterine enlargement, menorrhagia  . DILATION AND CURETTAGE OF UTERUS  05/27/2002   Dysfunctional uterine bleeding  . LAPAROSCOPY    . MASTECTOMY W/ SENTINEL NODE BIOPSY Bilateral 10/08/2017   Procedure: RIGHT MASTECTOMY WITH RIGHT AXILLARY SENTINEL LYMPH NODE  BIOPSY AND LEFT PROPHYLACTIC MASTECTOMY;  Surgeon: [name], MD;  Location: [place];  Service: General;  Laterality: Bilateral;  . NOVASURE ABLATION  10/15/2003   for management of her extreme menorrhagi  . PORTACATH PLACEMENT Right 04/02/2017   Procedure: INSERTION PORT-A-CATH;  Surgeon: [name], MD;  Location: [facility];  Service: General;  Laterality: Right;  . TUBAL LIGATION  09/2000   bilateral    FAMILY HISTORY Family History  Problem Relation Age of Onset  . Hypertension Father   . Diabetes Father   . Other Father        stomach mass suspicious for ca, never bx  . Coronary artery disease Mother   . Kidney cancer Mother 21  . Coronary artery disease Maternal Grandfather   . Colon cancer Maternal Grandfather 61       recurrence  . Coronary artery disease Maternal Uncle   . Coronary artery disease Maternal Uncle   . Diabetes Maternal Uncle   . Colon cancer Maternal Grandmother 69  . Breast cancer Cousin 35       had GT- CHEK2+  . Cervical cancer Maternal Aunt 35  . Diabetes Maternal Aunt   . Alzheimer's disease Paternal Grandmother   . Colon cancer Paternal Grandfather 23  . Breast  cancer Paternal Aunt   . Breast cancer Paternal Aunt   . Breast cancer Other 59   Her father died 03/17/2016 at age 44 just 26 week shy of his 83rd birthday. Her mother is 32 years old as of October 2018. Patient has one brother and no sisters. Her mother was diagnosed with kidney cancer at age 3 with a nephrectomy following. Her maternal grandmother was diagnosed with colon cancer at age 23.  The patient paternal grandfather was diagnosed with colon cancer in his 46's. She has paternal cousins through her fathers half-sisters that have been diagnosed with breast cancer, she is unsure of the number of breast cancer occurrences due to the distance . A maternal cousin was diagnosed with breast cancer at age 10 and she had genetic testing. The patient does have a copy of  this testing as well as her PCP.  This was not available for review on the nasal  GYNECOLOGIC HISTORY:  No LMP recorded. Patient has had an ablation.   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 Her husband Jennifer Beck is a Airline pilot. Her daughter Jennifer Beck is 52 and currently a sophomore at [employer]. 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  The patient attends [employer]. She lives in [place].      ADVANCED DIRECTIVES:    HEALTH MAINTENANCE: Social History   Tobacco Use  . Smoking status: Never Smoker  . Smokeless tobacco: Never Used  Substance Use Topics  . Alcohol use: Yes    Comment: Occasionally drinks wine  . Drug use: No     Colonoscopy: Yes, 2003  PAP: March 2018   Bone density: N/A   Allergies  Allergen Reactions  . Codeine Itching  . Biaxin [Clarithromycin]     Heart Burn    Current Outpatient Medications  Medication Sig Dispense Refill  . acetaminophen (TYLENOL) 325 MG tablet Take 650 mg by mouth every 6 (six) hours as needed.    . Cholecalciferol (VITAMIN D) 2000 units CAPS Take 2,000 Units by mouth daily.    . famotidine (PEPCID) 10 MG tablet Take 10 mg by mouth 2 (two) times daily.    [name] glucosamine-chondroitin 500-400 MG tablet Take 1 tablet by mouth daily.     [name] ibuprofen (ADVIL,MOTRIN) 200 MG tablet Take 400 mg by mouth every 8 (eight) hours as needed for headache or mild pain.     [name] LORazepam (ATIVAN) 0.5 MG tablet Take 1 tablet (0.5 mg total) by mouth at bedtime as needed for anxiety. 30 tablet 1  . methocarbamol (ROBAXIN) 500 MG tablet Take 1 tablet (500 mg total) by mouth every 6 (six) hours as needed for muscle spasms. 40 tablet 1  . Multiple  Minerals-Vitamins (CALCIUM-MAGNESIUM-ZINC) TABS Take 1 tablet by mouth daily.     . Multiple Vitamins-Minerals (MULTIVITAMINS THER. W/MINERALS) TABS Take 1 tablet by mouth daily.     [name] omeprazole (PRILOSEC) 40 MG capsule TAKE 1 CAPSULE BY MOUTH AT BEDTIME 30 capsule 0  . ondansetron (ZOFRAN) 8 MG tablet Take 1 tablet (8 mg total) by mouth every 8 (eight) hours as needed for nausea or vomiting. 20 tablet 3  . Polyethyl Glycol-Propyl Glycol (SYSTANE  OP) Apply 1 drop to eye 2 (two) times daily.     No current facility-administered medications for this visit.    Facility-Administered Medications Ordered in Other Visits  Medication Dose Route Frequency Provider Last Rate Last Dose  . sodium chloride flush (NS) 0.9 % injection 10 mL  10 mL Intracatheter PRN Magrinat, Virgie Dad, MD   10 mL at 07/04/17 1749    OBJECTIVE: Young white woman who appears well  Vitals:   10/18/17 1557  BP: (!) 94/56  Pulse: 92  Resp: 18  Temp: 97.9 F (36.6 C)  SpO2: 100%     Body mass index is 24.96 kg/m.   Wt Readings from Last 3 Encounters:  10/18/17 152 lb 4.8 oz (69.1 kg)  10/08/17 156 lb 2 oz (70.8 kg)  08/22/17 159 lb 6.4 oz (72.3 kg)   ECOG FS:1  Sclerae unicteric, EOMs intact Oropharynx clear and moist No cervical or supraclavicular adenopathy Lungs no rales or rhonchi Heart regular rate and rhythm Abd soft, nontender, positive bowel sounds MSK no focal spinal tenderness, no upper extremity lymphedema Neuro: nonfocal, well oriented, appropriate affect Breasts: Status post bilateral mastectomies with expanders in place.  There is a drain on each side, with approximately 15 cc serosanguineous fluid in the bulbs.  The initial impression is very good, quite symmetric.  The incisions are healing very nicely.    LAB RESULTS:  CMP     Component Value Date/Time   NA 142 09/20/2017 0845   NA 137 05/30/2017 1219   K 4.2 09/20/2017 0845   K 3.8 05/30/2017 1219   CL 105 09/20/2017 0845   CO2 24  09/20/2017 0845   CO2 25 05/30/2017 1219   GLUCOSE 98 09/20/2017 0845   GLUCOSE 122 05/30/2017 1219   BUN 13 09/20/2017 0845   BUN 8.9 05/30/2017 1219   CREATININE 0.69 09/20/2017 0845   CREATININE 0.7 05/30/2017 1219   CALCIUM 9.7 09/20/2017 0845   CALCIUM 9.4 05/30/2017 1219   PROT 6.9 09/20/2017 0845   PROT 6.4 05/30/2017 1219   ALBUMIN 4.1 09/20/2017 0845   ALBUMIN 3.7 05/30/2017 1219   AST 30 09/20/2017 0845   AST 12 05/30/2017 1219   ALT 30 09/20/2017 0845   ALT 14 05/30/2017 1219   ALKPHOS 72 09/20/2017 0845   ALKPHOS 137 05/30/2017 1219   BILITOT 0.4 09/20/2017 0845   BILITOT 0.38 05/30/2017 1219   GFRNONAA >60 09/20/2017 0845   GFRAA >60 09/20/2017 0845    No results found for: TOTALPROTELP, ALBUMINELP, A1GS, A2GS, BETS, BETA2SER, GAMS, MSPIKE, SPEI  No results found for: KPAFRELGTCHN, LAMBDASER, KAPLAMBRATIO  Lab Results  Component Value Date   WBC 4.3 09/20/2017   NEUTROABS 2.4 09/20/2017   HGB 12.3 09/20/2017   HCT 35.3 09/20/2017   MCV 104.6 (H) 09/20/2017   PLT 265 09/20/2017    _0 @  No results found for: LABCA2  No components found for: WUXLKG401  No results for input(s): INR in the last 168 hours.  No results found for: LABCA2  No results found for: UUV253  No results found for: GUY403  No results found for: KVQ259  No results found for: CA2729  No components found for: HGQUANT  No results found for: CEA1 / No results found for: CEA1   No results found for: AFPTUMOR  No results found for: CHROMOGRNA  No results found for: PSA1  No visits with results within 3 Day(s) from this visit.  Latest known visit with results is:  Appointment  on 09/20/2017  Component Date Value Ref Range Status  . WBC Count 09/20/2017 4.3  3.9 - 10.3 K/uL Final  . RBC 09/20/2017 3.37* 3.70 - 5.45 MIL/uL Final  . Hemoglobin 09/20/2017 12.3  11.6 - 15.9 g/dL Final  . HCT 09/20/2017 35.3  34.8 - 46.6 % Final  . MCV 09/20/2017 104.6* 79.5 -  101.0 fL Final  . MCH 09/20/2017 36.5* 25.1 - 34.0 pg Final  . MCHC 09/20/2017 34.9  31.5 - 36.0 g/dL Final  . RDW 09/20/2017 14.7* 11.2 - 14.5 % Final  . Platelet Count 09/20/2017 265  145 - 400 K/uL Final  . Neutrophils Relative % 09/20/2017 57  % Final  . Neutro Abs 09/20/2017 2.4  1.5 - 6.5 K/uL Final  . Lymphocytes Relative 09/20/2017 23  % Final  . Lymphs Abs 09/20/2017 1.0  0.9 - 3.3 K/uL Final  . Monocytes Relative 09/20/2017 12  % Final  . Monocytes Absolute 09/20/2017 0.5  0.1 - 0.9 K/uL Final  . Eosinophils Relative 09/20/2017 7  % Final  . Eosinophils Absolute 09/20/2017 0.3  0.0 - 0.5 K/uL Final  . Basophils Relative 09/20/2017 1  % Final  . Basophils Absolute 09/20/2017 0.0  0.0 - 0.1 K/uL Final   Performed at [facility], [place] [address], [place], [place] [address]  . Magnesium 09/20/2017 1.9  1.7 - 2.4 mg/dL Final   Performed at [facility], [place] [address] [place], [place] [address]  . Vit D, 25-Hydroxy 09/20/2017 39.7  30.0 - 100.0 ng/mL Final   Comment: (NOTE) Vitamin D deficiency has been defined by the [place] practice guideline as a level of serum 25-OH vitamin D less than 20 ng/mL (1,2). The Endocrine Society went on to further define vitamin D insufficiency as a level between 21 and 29 ng/mL (2). 1. IOM (Institute of Medicine). 2010. Dietary reference   intakes for calcium and D. [place]: The   [employer]. 2. Holick MF, Binkley Homer, Bischoff-Ferrari HA, et al.   Evaluation, treatment, and prevention of vitamin D   deficiency: an Endocrine Society clinical practice   guideline. JCEM. 2011 Jul; 96(7):1911-30. Performed At: [facility] [place], [place] [id number] Rush Farmer MD PQ:[id number] Performed at [facility], [place] [address], [place], [place] [address]     (this displays the last labs from  the last 3 days)  No results found for: TOTALPROTELP, ALBUMINELP, A1GS, A2GS, BETS, BETA2SER, GAMS, MSPIKE, SPEI (this displays SPEP labs)  No results found for: KPAFRELGTCHN, LAMBDASER, KAPLAMBRATIO (kappa/lambda light chains)  No results found for: HGBA, HGBA2QUANT, HGBFQUANT, HGBSQUAN (Hemoglobinopathy evaluation)   No results found for: LDH  No results found for: IRON, TIBC, IRONPCTSAT (Iron and TIBC)  No results found for: FERRITIN  Urinalysis No results found for: COLORURINE, APPEARANCEUR, LABSPEC, PHURINE, GLUCOSEU, HGBUR, BILIRUBINUR, KETONESUR, PROTEINUR, UROBILINOGEN, NITRITE, LEUKOCYTESUR   STUDIES: Nm Sentinel Node Inj-no Rpt (breast)  Result Date: 10/08/2017 Sulfur colloid was injected by the nuclear medicine technologist for melanoma sentinel node.    ELIGIBLE FOR AVAILABLE RESEARCH PROTOCOL: Considering Pembro study  ASSESSMENT: 52 y.o. [place] woman status post right breast upper inner quadrant biopsy March 21, 2017 for a clinical T2 N0  Stage IIB invasive ductal carcinoma, grade 3, triple negative, with an MIB-1 of 80%.  (a) biopsy of 2 additional suspicious areas in the right breast and one in the left breast 04/11/2017 showed  atypical lobular hyperplasia and sclerosing adenosis but no evidence of cancer  (1) neoadjuvant chemotherapy consisting of doxorubicin and cyclophosphamide in dose dense fashion x4 completed 05/23/2017, followed by paclitaxel/carboplatin weekly x12, first dose 06/06/2017, completed 08/29/2017  (2) status post bilateral mastectomies 10/08/2017, showing  (a) left breast, atypical lobular hyperplasia, no evidence of malignancy  (b) right breast, residual  pT1b pN0 invasive ductal carcinoma, grade 3, with negative margins.  A total of 2 sentinel lymph nodes were removed; repeat prognostic panel requested 10/18/2017  (c) immediate expander reconstruction with definitive implants to follow  (3) adjuvant radiation not indicated  (4)  genetics testing 04/22/2017 through the common hereditary cancer panel plus renal/urinary tract cancel panel showed no deleterious mutations in APC, ATM, AXIN2, BAP1, BARD1, BMPR1A, BRCA1, BRCA2, BRIP1, BUB1B, CDC73, CDH1, CDK4, CDKN1C, CDKN2A (p14ARF), CDKN2A (p16INK4a), CEP57, CHEK2, CTNNA1, DICER1, DIS3L2, EPCAM*, FH, FLCN, GPC3, GREM1*, KIT, MEN1, MET, MLH1,MSH2, MSH3, MSH6, MUTYH, NBN, NF1, PALB2, PDGFRA, PMS2, POLD1, POLE, PTEN, RAD50, RAD51C, RAD51D, SDHB,SDHC, SDHD, SMAD4, SMARCA4, SMARCB1, STK11, TP53, TSC1, TSC2, VHL, WT1.The following genes were evaluated for sequence changes only: HOXB13*, MITF*, NTHL1*, SDHA  (a) there was a Variant of Uncertain Significance identified in POLD1, namely c.2953C>T (p.Arg985Trp)  PLAN:  Tyechia has completed her systemic therapy and she does not require adjuvant radiation.  That means local therapy is also completed.  She did not have a complete pathologic response although she did have a good response overall.  She qualifies for the SWOG [phone] study which randomly as signs patients like her to pembrolizumab versus observation.  She met with research nurse Stacy and I also discussed the study at length with her.  She has the appropriate information and will be deciding whether or not to participate  If she decides to participate we will keep the port.  Otherwise she would like to have the port removed in the near future.  She is having issues with vaginal dryness.  She currently has no breast tissue and of course her cancer was estrogen and progesterone receptor negative.  Accordingly I am comfortable with her receiving vaginal estrogens and I went ahead and wrote her for Estring.  I think she would benefit from our pelvic health program and I placed the appropriate referral.  She is also having hot flashes.  We discussed various ways of dealing with this and I have started her on venlafaxine at 75 mg daily.  She understands that if she has any problems or  side effects from the drug she should not simply stop it but let me know and I will taper it off for her  Otherwise she is looking forward to completing her reconstruction later this year.  She is going to see me in August and assuming all is going well at that point I will start seeing her on an every 23-monthbasis.  She understands we do not do routine scans for follow-up but she expressed some interest in having a CT of the chest and bone scan 2 years from her definitive surgery and I think that is not unreasonable  She knows to call for any other issues that may develop before her next visit here..Jana Hakim GVirgie Dad MD  10/18/17 4:58 PM [facility] On[facility] H[facility] [address]Tel. [phone]   Fax. [phone] This document serves as a record of services personally performed by GChauncey Cruel MD. It was created on his behalf  by Margit Banda, a trained medical scribe. The creation of this record is based on the scribe's personal observations and the provider's statements to them.   I have reviewed the above documentation for accuracy and completeness, and I agree with the above.

## 2017-10-18 ENCOUNTER — Inpatient Hospital Stay: Payer: 59 | Attending: Oncology | Admitting: Oncology

## 2017-10-18 ENCOUNTER — Encounter: Payer: Self-pay | Admitting: *Deleted

## 2017-10-18 VITALS — BP 94/56 | HR 92 | Temp 97.9°F | Resp 18 | Ht 65.5 in | Wt 152.3 lb

## 2017-10-18 DIAGNOSIS — Z803 Family history of malignant neoplasm of breast: Secondary | ICD-10-CM | POA: Insufficient documentation

## 2017-10-18 DIAGNOSIS — Z85828 Personal history of other malignant neoplasm of skin: Secondary | ICD-10-CM

## 2017-10-18 DIAGNOSIS — Z171 Estrogen receptor negative status [ER-]: Secondary | ICD-10-CM

## 2017-10-18 DIAGNOSIS — N951 Menopausal and female climacteric states: Secondary | ICD-10-CM | POA: Insufficient documentation

## 2017-10-18 DIAGNOSIS — Z9221 Personal history of antineoplastic chemotherapy: Secondary | ICD-10-CM | POA: Diagnosis not present

## 2017-10-18 DIAGNOSIS — Z800 Family history of malignant neoplasm of digestive organs: Secondary | ICD-10-CM | POA: Diagnosis not present

## 2017-10-18 DIAGNOSIS — Z79899 Other long term (current) drug therapy: Secondary | ICD-10-CM

## 2017-10-18 DIAGNOSIS — C50411 Malignant neoplasm of upper-outer quadrant of right female breast: Secondary | ICD-10-CM | POA: Diagnosis not present

## 2017-10-18 DIAGNOSIS — Z8051 Family history of malignant neoplasm of kidney: Secondary | ICD-10-CM | POA: Diagnosis not present

## 2017-10-18 MED ORDER — ESTRADIOL 2 MG VA RING: 2.0000 mg | VAGINAL_RING | VAGINAL | 12 refills | Status: DC

## 2017-10-18 MED ORDER — VENLAFAXINE HCL ER 75 MG PO CP24: 75.0000 mg | ORAL_CAPSULE | Freq: Every day | ORAL | 4 refills | Status: DC

## 2017-10-29 ENCOUNTER — Other Ambulatory Visit: Payer: Self-pay

## 2017-10-29 ENCOUNTER — Encounter: Payer: Self-pay | Admitting: Physical Therapy

## 2017-10-29 ENCOUNTER — Ambulatory Visit: Payer: 59 | Attending: Oncology | Admitting: Physical Therapy

## 2017-10-29 DIAGNOSIS — R300 Dysuria: Secondary | ICD-10-CM | POA: Diagnosis not present

## 2017-10-29 DIAGNOSIS — M6281 Muscle weakness (generalized): Secondary | ICD-10-CM

## 2017-10-29 DIAGNOSIS — R279 Unspecified lack of coordination: Secondary | ICD-10-CM | POA: Diagnosis not present

## 2017-10-29 DIAGNOSIS — M25612 Stiffness of left shoulder, not elsewhere classified: Secondary | ICD-10-CM | POA: Diagnosis present

## 2017-10-29 DIAGNOSIS — R293 Abnormal posture: Secondary | ICD-10-CM | POA: Diagnosis present

## 2017-10-29 DIAGNOSIS — M25611 Stiffness of right shoulder, not elsewhere classified: Secondary | ICD-10-CM | POA: Insufficient documentation

## 2017-10-29 DIAGNOSIS — Z483 Aftercare following surgery for neoplasm: Secondary | ICD-10-CM | POA: Insufficient documentation

## 2017-10-29 DIAGNOSIS — N951 Menopausal and female climacteric states: Secondary | ICD-10-CM | POA: Diagnosis not present

## 2017-10-29 NOTE — Therapy (Addendum)
[facility] [address], [place], [place], [address] Phone: [phone]   Fax:  [phone]  Physical Therapy Evaluation  Patient Details  Name: Jennifer Beck MRN: [id number] Date of Birth: 09/02/1965 Referring Provider: Chauncey Cruel, MD   Encounter Date: 10/29/2017  PT End of Session - 10/29/17 1217    Visit Number  1    Date for PT Re-Evaluation  12/24/17    Authorization - Visit Number  1    Authorization - Number of Visits  20    PT Start Time  0254    PT Stop Time  1223    PT Time Calculation (min)  35 min    Activity Tolerance  Patient tolerated treatment well    Behavior During Therapy  [facility] for tasks assessed/performed       Past Medical History:  Diagnosis Date  . Arthritis   . Chest pain   . Complication of anesthesia   . Endometriosis   . Family history of breast cancer 04/22/2017  . Family history of colon cancer 04/22/2017  . Family history of kidney cancer 04/22/2017  . Fatigue   . GERD (gastroesophageal reflux disease)   . GI bleed    caused by ischemic colitis following an episode of hypertension  . Heart palpitations   . Hx of echocardiogram    a. echo 9/13:  EF 55-60%, Gr 2 diast dysfn  . Hypercholesterolemia   . Ischemic colitis ([place])   . 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 Myocarditis ([place])   . Pericarditis   . PONV (postoperative nausea and vomiting)   . SOB (shortness of breath)     Past Surgical History:  Procedure Laterality Date  . BREAST RECONSTRUCTION WITH PLACEMENT OF TISSUE EXPANDER AND ALLODERM Bilateral 10/08/2017   Procedure: BREAST RECONSTRUCTION WITH PLACEMENT OF TISSUE EXPANDER AND ALLODERM;  Surgeon: [name], MD;  Location: [place];  Service: Plastics;  Laterality: Bilateral;  . CARDIAC CATHETERIZATION  08/26/2001   EF of 60% --- smooth & normal coronary arteries -- there  is a Goucher motion defect consistent with posterolateral MI -- she quite possibly had a coronary spasm -- she may have some pericarditis secondary to her MI   . DILATION AND CURETTAGE OF UTERUS  10/15/2003   Uterine enlargement, menorrhagia  . DILATION AND CURETTAGE OF UTERUS  05/27/2002   Dysfunctional uterine bleeding  . LAPAROSCOPY    . MASTECTOMY W/ SENTINEL NODE BIOPSY Bilateral 10/08/2017   Procedure: RIGHT MASTECTOMY WITH RIGHT AXILLARY SENTINEL LYMPH NODE BIOPSY AND LEFT PROPHYLACTIC MASTECTOMY;  Surgeon: [name], MD;  Location: [place];  Service: General;  Laterality: Bilateral;  . NOVASURE ABLATION  10/15/2003   for management of her extreme menorrhagi  . PORTACATH PLACEMENT Right 04/02/2017   Procedure: INSERTION PORT-A-CATH;  Surgeon: [name], MD;  Location: [facility];  Service: General;  Laterality: Right;  . TUBAL LIGATION  09/2000   bilateral    There were no vitals filed for this visit.   Subjective Assessment - 10/29/17 1146    Subjective  Pt reports she is having burning with urination and some pain with intercourse due to vaginal dryness.     Pertinent History  double mastectomy, estrogen receptor positive breast cancer    Limitations  Other (comment) toileting and self care    Patient Stated Goals  reduced pain with urination and intercourse  Currently in Pain?  No/denies         [facility] PT Assessment - 10/30/17 0001      Assessment   Medical Diagnosis  Malignant neoplasm of upper-outer quadrant of right breast in female, estrogen receptor negative [facility])  - Primary     Referring Provider  Magrinat, Virgie Dad, MD    Onset Date/Surgical Date  -- May 2019    Prior Therapy  No      Precautions   Precautions  None      Restrictions   Weight Bearing Restrictions  No      Home Environment   Living Environment  Private residence    Living Arrangements  Spouse/significant other      Prior Function   Vocation  Full time employment going  back next week    Vocation Requirements  sitting      Cognition   Overall Cognitive Status  Within Functional Limits for tasks assessed      Posture/Postural Control   Posture/Postural Control  Postural limitations    Postural Limitations  Rounded Shoulders      PROM   Overall PROM Comments  tight calves and hamstrings bilaterally; hip IR and ER limited 20% bilaterally      Strength   Overall Strength Comments  weak adductors bilaterally      Flexibility   Soft Tissue Assessment /Muscle Length  -- tight hamstrings      Palpation   SI assessment   WNL      Ambulation/Gait   Gait Pattern  Within Functional Limits                Objective measurements completed on examination: See above findings.    Pelvic Floor Special Questions - 10/30/17 0001    Prior Pelvic/Prostate Exam  Yes    Are you Pregnant or attempting pregnancy?  Yes    Number of Pregnancies  1    Number of Vaginal Deliveries  1    Any difficulty with labor and deliveries  Yes    Episiotomy Performed  Yes large incision and stitches    Currently Sexually Active  Yes    Is this Painful  No    Marinoff Scale  discomfort that does not affect completion    Urinary Leakage  Yes    How often  just a dribble after peeing when can't finish    Pad use  no    Activities that cause leaking  Other    Other activities that cause leaking  very rarely after voiding, small drop    Urinary urgency  No    Urinary frequency  sometimes can't go all the way and have to go again    Fecal incontinence  No    Falling out feeling (prolapse)  No    Skin Integrity  Intact;Other    Skin Integrity other  vaginal dryness    Prolapse  None    Pelvic Floor Internal Exam  pt informed and consent given to perform internal soft tissue assessment    Exam Type  Vaginal    Palpation  tight obdurator internus, vaginal dryness    Strength  weak squeeze, no lift    Strength # of reps  1    Strength # of seconds  3                PT Education - 10/29/17 1217    Education Details  moisturizers, lubricants, stretches    Person(s) Educated  Patient    Methods  Explanation;Demonstration;Handout    Comprehension  Verbalized understanding;Returned demonstration       PT Short Term Goals - 10/30/17 0813      PT SHORT TERM GOAL #1   Title  ind with toilet techniques and be able to completely empty with voiding    Time  4    Period  Weeks    Status  New    [place] Date  11/26/17      PT SHORT TERM GOAL #2   Title  ind with initial HEP    Time  4    Period  Weeks    Status  New    [place] Date  11/26/17        PT Long Term Goals - 10/30/17 0814      PT LONG TERM GOAL #1   Title  ind with advanced HEP    Time  8    Period  Weeks    Status  New    [place] Date  12/24/17      PT LONG TERM GOAL #2   Title  Pt will have intercourse and able to void without pain due to knowledge of self care tools for improved tissue health    Time  8    Period  Weeks    Status  New    Target Date  12/24/17      PT LONG TERM GOAL #3   Title  Pt will be able to contract pelvic floor and hold for 10 seconds or more due to improved soft tissue health    Time  8    Period  Weeks    Status  New    Target Date  12/24/17      PT LONG TERM GOAL #4   Title  --             Plan - 10/30/17 0802    Clinical Impression Statement  Pt presents to PT due to pain associated with urination and intercourse.  Pt has some difficulty voiding completely at times.  She deomonstrates pelvic floor weakness of 2/5 MMT.  Pt deomonstrates weakness of LE bilaerally.  She has tight calves and hamstrings with decreased hip ROM bialterally. Pt has some difficulty relaxing pelvic floor after contraction.  She has some postural abnormalities    History and Personal Factors relevant to plan of care:  history of breast cancer, tubaligation, vaginal delivery with significant tearing    Clinical Presentation  Stable    Clinical  Presentation due to:  pt is stable    Clinical Decision Making  Low    Rehab Potential  Excellent    PT Frequency  1x / week    PT Duration  8 weeks    PT Treatment/Interventions  ADLs/Self Care Home Management;Biofeedback;Electrical Stimulation;Moist Heat;Cryotherapy;Therapeutic activities;Therapeutic exercise;Neuromuscular re-education;Patient/family education;Passive range of motion;Scar mobilization;Manual techniques;Dry needling;Taping    PT Next Visit Plan  biofeedback, review toilet techniques, stretch calf, hamstring, lumbar, hip    Consulted and Agree with Plan of Care  Patient       Patient will benefit from skilled therapeutic intervention in order to improve the following deficits and impairments:  Pain, Postural dysfunction, Increased fascial restricitons, Decreased strength, Decreased coordination  Visit Diagnosis: Muscle weakness (generalized) - Plan: PT plan of care cert/re-cert  Unspecified lack of coordination - Plan: PT plan of care cert/re-cert     Problem List Patient Active Problem List   Diagnosis Date Noted  .  Genetic testing 05/02/2017  . Family history of breast cancer 04/22/2017  . Family history of colon cancer 04/22/2017  . Family history of kidney cancer 04/22/2017  . Malignant neoplasm of upper-outer quadrant of right breast in female, estrogen receptor negative ([place]) 03/26/2017  . Chest tightness 03/21/2012    Zannie Cove, PT 10/30/2017, 8:20 AM  [facility] [address], [place], [place], [address] Phone: [phone]   Fax:  [phone]  Name: Jennifer Beck MRN: [id number] Date of Birth: 02-06-1966

## 2017-10-29 NOTE — Patient Instructions (Addendum)
Moisturizers . They are used in the vagina to hydrate the mucous membrane that make up the vaginal canal. . Designed to keep a more normal acid balance (ph) . Once placed in the vagina, it will last between two to three days.  . Use 2-3 times per week at bedtime and last longer than 60 min. . Ingredients to avoid is glycerin and fragrance, can increase chance of infection . Should not be used just before sex due to causing irritation . Most are gels administered either in a tampon-shaped applicator or as a vaginal suppository. They are non-hormonal.   Types of Moisturizers . Samul Dada- drug store . Vitamin E vaginal suppositories- Whole foods, Amazon . Moist Again . Coconut oil- can break down condoms . Michail Jewels . Yes moisturizer- amazon . NeuEve Silk , NeuEve Silver for menopausal or over 65 (if have severe vaginal atrophy or cancer treatments use NeuEve Silk for  1 month than move to The Pepsi)- [employer], MapleFlower.dk . Olive and Bee intimate cream- www.oliveandbee.com.au  Creams to use externally on the Vulva area  Albertson's (good for for cancer patients that had radiation to the area)- [place] or [employer] Corporation.FlyingBasics.com.br  V-magic cream - amazon  Julva-amazon  Vital "V Wild Yam salve ( help moisturize and help with thinning vulvar area, does have Natural Steps   Things to avoid in the vaginal area . Do not use things to irritate the vulvar area . No lotions just specialized creams for the vulva area- Neogyn, V-magic, No soaps; can use Aveeno or Calendula cleanser if needed. Must be gentle . No deodorants . No douches . Good to sleep without underwear to let the vaginal area to air out . No scrubbing: spread the lips to let warm water rinse over labias and pat dry  Lubrication . Used for intercourse to reduce friction . Avoid ones that have glycerin, warming gels, tingling gels, icing or  cooling gel, scented . May need to be reapplied once or several times during sexual activity . Can be applied to both partners genitals prior to vaginal penetration to minimize friction or irritation . Prevent irritation and mucosal tears that cause post coital pain and increased the risk of vaginal and urinary tract infections . Oil-based lubricants cannot be used with condoms due to breaking them down.  Least likely to irritate vaginal tissue.  . Plant based-lubes are safe . Silicone-based lubrication are thicker and last long and used for post-menopausal women Types of Lubricants . Good Clean Love (water based)-[employer], Target, Walmart, CVS . Slippery Stuff(water based) [employer] . Sylk (water based) [employer], [place]- drug store; www.blossom-organics.com . Samul Dada- Drug store . Coconut oil- will breakdown condoms, least irritating . Aloe Vera- least irritating . Sliquid Natural H20 (water based)-Walgreen's, good if frequent UTI's . Wet Platinum- (Silicone) Target, Walgreen's . Yes [place] . [place] Jelly, Replens, and Astroglide kills good Bacteria (lactobadilli)  Things to avoid in the vaginal area . Do not use things to irritate the vulvar area . No lotions . No soaps; can use Aveeno or Calendula cleanser if needed. Must be gentle . No deodorants . No douches . Good to sleep without underwear to let the vaginal area to air out . No scrubbing: spread the lips to let warm water rinse over labias and pat dry  Toileting Techniques for Bowel Movements (Defecation) Using your belly (abdomen) and pelvic floor muscles to have a  bowel movement is usually instinctive.  Sometimes people can have problems with these muscles and have to relearn proper defecation (emptying) techniques.  If you have weakness in your muscles, organs that are falling out, decreased sensation in your pelvis, or ignore your urge to go, you may find yourself straining to have a bowel movement.  You  are straining if you are: . holding your breath or taking in a huge gulp of air and holding it  . keeping your lips and jaw tensed and closed tightly . turning red in the face because of excessive pushing or forcing . developing or worsening your  hemorrhoids . getting faint while pushing . not emptying completely and have to defecate many times a day  If you are straining, you are actually making it harder for yourself to have a bowel movement.  Many people find they are pulling up with the pelvic floor muscles and closing off instead of opening the anus. Due to lack pelvic floor relaxation and coordination the abdominal muscles, one has to work harder to push the feces out.  Many people have never been taught how to defecate efficiently and effectively.  Notice what happens to your body when you are having a bowel movement.  While you are sitting on the toilet pay attention to the following areas: . Jaw and mouth position . Angle of your hips   . Whether your feet touch the ground or not . Arm placement  . Spine position . Waist . Belly tension . Anus (opening of the anal canal)  An Evacuation/Defecation Plan   Here are the 4 basic points:  1. Lean forward enough for your elbows to rest on your knees 2. Support your feet on the floor or use a low stool if your feet don't touch the floor  3. Push out your belly as if you have swallowed a beach ball-you should feel a widening of your waist 4. Open and relax your pelvic floor muscles, rather than tightening around the anus      The following conditions my require modifications to your toileting posture:  . If you have had surgery in the past that limits your back, hip, pelvic, knee or ankle flexibility . Constipation   Your healthcare practitioner may make the following additional suggestions and adjustments:  1) Sit on the toilet  a) Make sure your feet are supported. b) Notice your hip angle and spine position-most people  find it effective to lean forward or raise their knees, which can help the muscles around the anus to relax  c) When you lean forward, place your forearms on your thighs for support  2) Relax suggestions a) Breath deeply in through your nose and out slowly through your mouth as if you are smelling the flowers and blowing out the candles. b) To become aware of how to relax your muscles, contracting and releasing muscles can be helpful.  Pull your pelvic floor muscles in tightly by using the image of holding back gas, or closing around the anus (visualize making a circle smaller) and lifting the anus up and in.  Then release the muscles and your anus should drop down and feel open. Repeat 5 times ending with the feeling of relaxation. c) Keep your pelvic floor muscles relaxed; let your belly bulge out. d) The digestive tract starts at the mouth and ends at the anal opening, so be sure to relax both ends of the tube.  Place your tongue on the roof of  your mouth with your teeth separated.  This helps relax your mouth and will help to relax the anus at the same time.  3) Empty (defecation) a) Keep your pelvic floor and sphincter relaxed, then bulge your anal muscles.  Make the anal opening wide.  b) Stick your belly out as if you have swallowed a beach ball. c) Make your belly Linse hard using your belly muscles while continuing to breathe. Doing this makes it easier to open your anus. d) Breath out and give a grunt (or try using other sounds such as ahhhh, shhhhh, ohhhh or grrrrrrr).  4) Finish a) As you finish your bowel movement, pull the pelvic floor muscles up and in.  This will leave your anus in the proper place rather than remaining pushed out and down. If you leave your anus pushed out and down, it will start to feel as though that is normal and give you incorrect signals about needing to have a bowel movement.    This meditation is from [employer], physical therapist and yoga  instructor.  Patients, yoga students and pelvic health practitioners that I have shared this with have seemed to really enjoy it, find it easy to practice and teach, and report its usefulness! It involves 6 stages, using the acronym "AIRBAG" to help you remember! If you are sitting on the toilet, it is a good idea to place your feet up on some blocks so that your knees are slightly higher than your hips. This position can help enhance your PFMs to relax and allow for proper elimination, particularly for bowel movements. If you are standing during urination, these toilet meditation stages of "AIRBAG" can still be performed: A = Awareness: become present and connected to your body as best as you can. A brief body scan from head to toe simply observing sensations that you may be experiencing, without judgement, both internally (interoceptive awareness) and externally (such as the sensations at the soles of the feet weight bearing on the supporting surface, or sensations at the thighs as they weight bear on the toilet seat, or how you are holding your arms, or any tension in the jaw or shoulders). You may include awareness of thoughts and emotions, without elaborating on a story or analyzing. I = Imagination: use your visualization skills to imagine your pelvic floor and the general area of attachments of the PFMs to the inside of the front, sides and back of the pelvis, the tailbone and sacrum. Visualize where the bladder and bowel are positioned and imagine them emptying and that the PFMs spanning across the pelvic floor are healthy and functioning optimally. R = Release & Relax: let go of any tension in the PFMs as best as you can. Releasing and relaxing these muscles can sometimes be difficult for a variety of reasons. Sometimes 'trying too hard' to relax and let go creates even more tension. Be patient and compassionate towards yourself if you have trouble with this. Letting go often takes courage, trust,  concentration and practise. B = Breathe: allow your natural breath pattern to emerge. Sometimes when we try to breathe, we create more tension that results in unnatural patterns that do not serve a relaxed state. As you quietly inhale, the belly will naturally protrude outwards or forward and the pelvic floor will descend. As you exhale, the belly and PFMs will return to their resting positions. During toileting, see if you can simply allow the quiet rhythm of the abdomino-pelvic diaphragmatic breath to happen on  its own without trying to change it. A = Allow: this 'allowing' stage is a little more than just releasing and relaxing or allowing the breath to happen on its own. See if you can really give yourself permission to trust that your body knows what to do and when to do it. Perhaps you feel the need to push gently (do not strain) or you feel like you want to take a deep breath, sigh out loud, lean forward, or place your feet in a different position. The more refined your awareness skills are, the more you can trust what feels right, and not always what you think you should do. G = Gratitude: I think it is a healthy practice to not only be completely present and mindful when toileting, but to also honour this sophisticated and truly complex function that our body does for [place] on a daily basis without [place] even asking it to. So each time you complete your toileting event, I invite you to send a little gratitude to your body and all its incredibly phenomenal parts to end your toilet meditation ! There is a FREE bonus feature of the full guided Toilet Meditation (with music and breathtaking cinematography) included along with the "Creating Pelvic Floor Health with PhysioYoga" practice sessions which consist of a combination of physical therapy based exercises and yoga methods targeted to optimize pelvic floor health. I also have a free brief segment of the meditation on my YouTube channel.   Position yourself  as shown grabbing onto feet or behind the knees. You should feel a gentle stretch. Breathe in and allow the pelvic floor muscles to relax. Hold 1 min. 2 times per day.  Adductors, Frog Squat    Crouch with elbows inside knees . Gently push knees outward. Hold _30__ seconds.1 Repeat __1_ times per session. Do _2__ sessions per day.  Copyright  VHI. All rights reserved.   BACK: Child's Pose (Sciatica)    Sit in knee-chest position and reach arms forward. Separate knees for comfort. Hold position for _60__ breaths. Repeat _2__ times. Do _2__ times per day.  Copyright  VHI. All rights reserved.   Piriformis Stretch    Lying on back, pull right knee toward opposite shoulder. Hold _30___ seconds. Repeat __2__ times. Do _1___ sessions per day.  http://gt2.exer.us/258   Copyright  VHI. All rights reserved.   Piriformis Stretch, Supine    Lie supine, one ankle crossed onto opposite knee. Holding bottom leg behind knee, gently pull legs toward chest until stretch is felt in buttock of top leg. Hold _30__ seconds. For deeper stretch gently push top knee away from body.  Repeat _2__ times per session. Do _2__ sessions per day.  Copyright  VHI. All rights reserved.    Supine Knee-to-Chest, Unilateral    Lie on back, hands clasped behind one knee. Pull knee in toward chest until a comfortable stretch is felt in lower back and buttocks. Hold 30___ seconds.  Repeat __2_ times per session. Do _1__ sessions per day.  Copyright  VHI. All rights reserved.  Supine With Rotation    Lie on back with one knee drawn toward chest. Slowly bring bent leg across body until stretch is felt in lower back area. Hold _30__ seconds. Repeat to other side. Repeat _2__ times per session. Do __2_ sessions per day.  Copyright  VHI. All rights reserved.  Butterfly, Supine    Lie on back, feet together. Lower knees toward floor. Hold 60___ seconds. Repeat _2__ times per session.  Do _1__  sessions per day.  Copyright  VHI. All rights reserved.    [facility] [address] [place],  [address]

## 2017-10-30 NOTE — Addendum Note (Signed)
Addended by: Lovett Calender D on: 10/30/2017 08:21 AM   Modules accepted: Orders

## 2017-10-31 ENCOUNTER — Ambulatory Visit: Payer: 59 | Admitting: Physical Therapy

## 2017-10-31 ENCOUNTER — Other Ambulatory Visit: Payer: Self-pay | Admitting: *Deleted

## 2017-10-31 ENCOUNTER — Encounter: Payer: Self-pay | Admitting: Physical Therapy

## 2017-10-31 DIAGNOSIS — Z171 Estrogen receptor negative status [ER-]: Principal | ICD-10-CM

## 2017-10-31 DIAGNOSIS — M6281 Muscle weakness (generalized): Secondary | ICD-10-CM

## 2017-10-31 DIAGNOSIS — R279 Unspecified lack of coordination: Secondary | ICD-10-CM

## 2017-10-31 DIAGNOSIS — R293 Abnormal posture: Secondary | ICD-10-CM

## 2017-10-31 DIAGNOSIS — C50411 Malignant neoplasm of upper-outer quadrant of right female breast: Secondary | ICD-10-CM

## 2017-10-31 DIAGNOSIS — Z483 Aftercare following surgery for neoplasm: Secondary | ICD-10-CM

## 2017-10-31 DIAGNOSIS — M25611 Stiffness of right shoulder, not elsewhere classified: Secondary | ICD-10-CM

## 2017-10-31 DIAGNOSIS — M25612 Stiffness of left shoulder, not elsewhere classified: Secondary | ICD-10-CM

## 2017-10-31 DIAGNOSIS — M25519 Pain in unspecified shoulder: Secondary | ICD-10-CM

## 2017-10-31 NOTE — Patient Instructions (Addendum)
STRETCHING THE PELVIC FLOOR MUSCLES NO DILATOR  Supplies . Vaginal lubricant . Mirror (optional) . Gloves (optional) Positioning . Start in a semi-reclined position with your head propped up. Bend your knees and place your thumb or finger at the vaginal opening. Procedure . Apply a moderate amount of lubricant on the outer skin of your vagina, the labia minora.  Apply additional lubricant to your finger. [name] Spread the skin away from the vaginal opening. Place the end of your finger at the opening. . Do a maximum contraction of the pelvic floor muscles. Tighten the vagina and the anus maximally and relax. . When you know they are relaxed, gently and slowly insert your finger into your vagina, directing your finger slightly downward, for 2-3 inches of insertion. . Relax and stretch the 6 o'clock position . Hold each stretch for _2 min__ and repeat __1_ time with rest breaks of _1__ seconds between each stretch. . Repeat the stretching in the 4 o'clock and 8 o'clock positions. . Total time should be _6__ minutes, _1__ x per day.  Note the amount of theme your were able to achieve and your tolerance to your finger in your vagina. . Once you have accomplished the techniques you may try them in standing with one foot resting on the tub, or in other positions.  This is a good stretch to do in the shower if you don't need to use lubricant.   Massage wand or stick - amazon.com, 5 below, [employer]  Access Code: [email or web address]  URL: [email or web address]  Date: 10/31/2017  Prepared by: Lovett Calender   Exercises  Supine Diaphragmatic Breathing with Pelvic Floor Lengthening - 10 reps - 3 sets - 1x daily - 7x weekly  Quadruped Cat Camel - 5 reps - 1 sets - 10 sec hold - 1x daily - 7x weekly  Supine Pelvic Floor Stretch - 3 reps - 1 sets - 30 sec hold - 1x daily - 7x weekly  Supine Hamstring Stretch - 3 reps - 1 sets - 30 sec hold - 1x daily - 7x weekly  Double Leg Hamstring Stretch at [place] -  3 reps - 1 sets - 30 sec hold - 1x daily - 7x weekly  Stretching Adductors - 3 reps - 3 sets - 1x daily - 7x weekly  Sidelying Thoracic Rotation with Open Book - 3 reps - 1 sets - 30 hold - 1x daily - 7x weekly    [facility] [address], [place], [place] [address] Phone # [phone] Fax [phone]

## 2017-10-31 NOTE — Therapy (Signed)
[place], [place], [address] Phone: [phone]   Fax:  [phone]  Physical Therapy Re-Evaluation  Patient Details  Name: Jennifer Beck MRN: [id number] Date of Birth: [birth year] Referring Provider: Dr. Iran Planas , Magrin[place]   Encounter Date: 10/31/2017  PT End of Session - 10/31/17 1239    Visit Number  3    Number of Visits  20    Date for PT Re-Evaluation  12/24/17    Authorization - Number of Visits  20    PT Start Time  1100    PT Stop Time  1145    PT Time Calculation (min)  45 min    Activity Tolerance  Patient tolerated treatment well    Behavior During Therapy  [facility] for tasks assessed/performed       Past Medical History:  Diagnosis Date  . Arthritis   . Chest pain   . Complication of anesthesia   . Endometriosis   . Family history of breast cancer 04/22/2017  . Family history of colon cancer 04/22/2017  . Family history of kidney cancer 04/22/2017  . Fatigue   . GERD (gastroesophageal reflux disease)   . GI bleed    caused by ischemic colitis following an episode of hypertension  . Heart palpitations   . Hx of echocardiogram    a. echo 9/13:  EF 55-60%, Gr 2 diast dysfn  . Hypercholesterolemia   . Ischemic colitis ([place])   . 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 Myocarditis ([place])   . Pericarditis   . PONV (postoperative nausea and vomiting)   . SOB (shortness of breath)     Past Surgical History:  Procedure Laterality Date  . BREAST RECONSTRUCTION WITH PLACEMENT OF TISSUE EXPANDER AND ALLODERM Bilateral 10/08/2017   Procedure: BREAST RECONSTRUCTION WITH PLACEMENT OF TISSUE EXPANDER AND ALLODERM;  Surgeon: [name], MD;  Location: [place];  Service: Plastics;  Laterality: Bilateral;  . CARDIAC CATHETERIZATION  08/26/2001   EF of 60% --- smooth & normal coronary arteries -- there is a [place]  motion defect consistent with posterolateral MI -- she quite possibly had a coronary spasm -- she may have some pericarditis secondary to her MI   . DILATION AND CURETTAGE OF UTERUS  10/15/2003   Uterine enlargement, menorrhagia  . DILATION AND CURETTAGE OF UTERUS  05/27/2002   Dysfunctional uterine bleeding  . LAPAROSCOPY    . MASTECTOMY W/ SENTINEL NODE BIOPSY Bilateral 10/08/2017   Procedure: RIGHT MASTECTOMY WITH RIGHT AXILLARY SENTINEL LYMPH NODE BIOPSY AND LEFT PROPHYLACTIC MASTECTOMY;  Surgeon: [name], MD;  Location: [place];  Service: General;  Laterality: Bilateral;  . NOVASURE ABLATION  10/15/2003   for management of her extreme menorrhagi  . PORTACATH PLACEMENT Right 04/02/2017   Procedure: INSERTION PORT-A-CATH;  Surgeon: [name], MD;  Location[facility];  Service: General;  Laterality: Right;  . TUBAL LIGATION  09/2000   bilateral    There were no vitals filed for this visit.   Subjective Assessment - 10/31/17 1110    Subjective  I haven't be doing anything with my arms     Pertinent History  double mastectomy Oct 08, 2017, with immediated expander placement 2 sentinel lymph nodes on the right, (  estrogen receptor positive breast cancer) chemotherapy from november til april 4  did well, occasional toe and finger numbness.  Pt will not have to  have radiation      Patient Stated Goals  wants to learn what to do with her arms since she had the mastectomy     Currently in Pain?  No/denies         [facility] PT Assessment - 10/31/17 0001      Assessment   Medical Diagnosis  Malignant neoplasm of upper-outer quadrant of right breast in female, estrogen receptor negative [facility])  - Primary     Referring Provider  Dr. Iran Planas , Magrinat    Onset Date/Surgical Date  10/08/17 May 2019    Hand Dominance  Right    Prior Therapy  No      Precautions   Precautions  None    Precaution Comments  as tolerated      Restrictions   Weight Bearing Restrictions   No      Balance Screen   Has the patient fallen in the past 6 months  No    Has the patient had a decrease in activity level because of a fear of falling?   No    Is the patient reluctant to leave their home because of a fear of falling?   No      Home Environment   Living Environment  Private residence    Living Arrangements  Spouse/significant other    Available Help at Discharge  Family      Prior Function   Vocation  Full time employment    Vocation Requirements  sitting    Leisure  wants to start exercising      Cognition   Overall Cognitive Status  Within Functional Limits for tasks assessed      Observation/Other Assessments   Observations  Pt with visible forward shouler, protective posture. She states she is not able to fully lie flat yet     Skin Integrity  well healed     Quick DASH   31.82      Sensation   Light Touch  Not tested      Coordination   Gross Motor Movements are Fluid and Coordinated  Yes      Posture/Postural Control   Posture/Postural Control  Postural limitations    Postural Limitations  Rounded Shoulders;Increased thoracic kyphosis    Posture Comments  decreased thoracic mobility       ROM / Strength   AROM / PROM / Strength  AROM;Strength      AROM   Right Shoulder Extension  45 Degrees    Right Shoulder Flexion  143 Degrees    Right Shoulder ABduction  104 Degrees    Right Shoulder External Rotation  75 Degrees tightness in anterior chest     Left Shoulder Extension  52 Degrees    Left Shoulder Flexion  139 Degrees    Left Shoulder ABduction  100 Degrees      PROM   Overall PROM Comments  --      Strength   Overall Strength  Deficits    Overall Strength Comments  limited by pain     Right/Left Shoulder  Right;Left    Right Shoulder Flexion  2+/5    Right Shoulder ABduction  2+/5    Left Shoulder Flexion  2+/5    Left Shoulder ABduction  2+/5      Flexibility   Soft Tissue Assessment /Muscle Length  --      Palpation   SI  assessment   --      Ambulation/Gait   Gait Pattern  --  LYMPHEDEMA/ONCOLOGY QUESTIONNAIRE - 10/31/17 1234      Type   Cancer Type  Right breast cancer      Surgeries   Mastectomy Date  10/08/17    Sentinel Lymph Node Biopsy Date  10/08/17    Number Lymph Nodes Removed  2      Treatment   Past Chemotherapy Treatment  Yes    Active Radiation Treatment  No    Past Radiation Treatment  No      What other symptoms do you have   Are you Having Heaviness or Tightness  Yes    Are you having Pain  Yes    Are you having pitting edema  No    Is it Hard or Difficult finding clothes that fit  No    Do you have infections  No    Is there Decreased scar mobility  Yes    [place] Sign  No      Lymphedema Assessments   Lymphedema Assessments  Upper extremities      Right Upper Extremity Lymphedema   10 cm Proximal to Olecranon Process  28.5 cm    Olecranon Process  25 cm    10 cm Proximal to Ulnar Styloid Process  21.8 cm    Just Proximal to Ulnar Styloid Process  16 cm    Across Hand at [employer]  19.5 cm    At [place] of 2nd Digit  6 cm      Left Upper Extremity Lymphedema   10 cm Proximal to Olecranon Process  28.5 cm    Olecranon Process  25 cm    10 cm Proximal to Ulnar Styloid Process  21.5 cm    Just Proximal to Ulnar Styloid Process  16 cm    Across Hand at [employer]  19.5 cm    At [place] of 2nd Digit  6 cm          Quick Dash - 10/31/17 0001    Open a tight or new jar  No difficulty    Do heavy household chores (wash walls, wash floors)  Moderate difficulty    Carry a shopping bag or briefcase  Mild difficulty    Wash your back  Mild difficulty    Use a knife to cut food  No difficulty    Recreational activities in which you take some force or impact through your arm, shoulder, or hand (golf, hammering, tennis)  Unable    During the past week, to what extent has your arm, shoulder or hand problem interfered with your normal social activities with  family, friends, neighbors, or groups?  Slightly    During the past week, to what extent has your arm, shoulder or hand problem limited your work or other regular daily activities  Modererately    Arm, shoulder, or hand pain.  Moderate    Tingling (pins and needles) in your arm, shoulder, or hand  None    Difficulty Sleeping  Mild difficulty    DASH Score  31.82 %        Objective measurements completed on examination: See above findings.            PT Education - 10/31/17 1238    Education provided  Yes    Education Details  supine dowel rod shoulder exercise, supine scapular retraction, information about ABC class and community exercise     Person(s) Educated  Patient    Methods  Explanation;Demonstration;Handout;Verbal cues    Comprehension  Verbalized understanding;Returned demonstration       PT Short Term Goals - 10/31/17 1412      PT SHORT TERM GOAL #3   Title  Pt will have right and left shoulder abduction > 125 degrees so that she can more easily wash her hair     Baseline  rt 104, lt 100 degrees on 10/31/2017     Time  4    Period  Weeks    Status  New        PT Long Term Goals - 10/31/17 1414      PT LONG TERM GOAL #4   Title  Pt will have 160 degrees of bilateral shoulder abduction so that she can return to her ususal activities without pain or diffuculty     Time  8    Period  Weeks    Status  New             Plan - 10/31/17 1358    Clinical Impression Statement  Pt came in today for assessment of upper quadrant after bilateal mastecomty with immediate expanaders. REevaluation for this new problem performed.  She has bilateral decrased shoulder range of motion and strength limited by pain and forward shoulder protective posture.  She does not show any signs of lymphedema in either UE or trunk.  She was instructed to return to our clinic for lymphedema risk reduction eduction and agrees to come to this free class.  She is limited in her PT visits  by insurance.  I feel that since she does not need specialized lymphedema treatment she will be able to receive physical theapy for posture correction, UE range of motion and strength at our [place] clinic.  Will contact Kennyth Lose, her therapist there, to make sure that she will be able to address those impairments.  Pt informed that she can contact us if she wants to come back here for specialized instruction about lymphedema, otherwise she will continue will all PT needs at [facility]     Rehab Potential  Excellent    Clinical Impairments Affecting Rehab Potential  previous chemotherapy     PT Frequency  1x / week    PT Duration  8 weeks    PT Treatment/Interventions  ADLs/Self Care Home Management;Biofeedback;Electrical Stimulation;Moist Heat;Cryotherapy;Therapeutic activities;Therapeutic exercise;Neuromuscular re-education;Patient/family education;Passive range of motion;Scar mobilization;Manual techniques;Dry needling;Taping    PT Next Visit Plan  biofeedback for muscle coordination contract/relax/bulge, stretch calf, postureal exercises, shoudler A/AA/PROM, progressive strengthening to UE's , Ask if pt called [facility] to sign up for ABC class     PT Home Exercise Plan  supine dowel rod shoulder exercises     Recommended Other Services  ABC class     Consulted and Agree with Plan of Care  Patient       Patient will benefit from skilled therapeutic intervention in order to improve the following deficits and impairments:  Pain, Postural dysfunction, Increased fascial restricitons, Decreased strength, Decreased coordination, Decreased knowledge of precautions, Decreased knowledge of use of DME, Decreased range of motion  Visit Diagnosis: Aftercare following surgery for neoplasm - Plan: PT plan of care cert/re-cert  Stiffness of right shoulder joint - Plan: PT plan of care cert/re-cert  Stiffness of left shoulder joint - Plan: PT plan of care cert/re-cert  Muscle weakness  (generalized) - Plan: PT plan of care cert/re-cert  Abnormal posture - Plan: PT plan of care cert/re-cert     Problem List Patient Active Problem List   Diagnosis Date  Noted  . Genetic testing 05/02/2017  . Family history of breast cancer 04/22/2017  . Family history of colon cancer 04/22/2017  . Family history of kidney cancer 04/22/2017  . Malignant neoplasm of upper-outer quadrant of right breast in female, estrogen receptor negative ([place]) 03/26/2017  . Chest tightness 03/21/2012   Donato Heinz. Owens Shark PT  Norwood Levo 10/31/2017, 8:04 PM  [place], [place], [address] Phone: [phone]   Fax:  [phone]  Name: CAMERIN JIMENEZ MRN: [id number] Date of Birth: [birth year]

## 2017-10-31 NOTE — Patient Instructions (Signed)

## 2017-10-31 NOTE — Therapy (Signed)
[facility] [address], [place], [place], [address] Phone: [phone]   Fax:  [phone]  Physical Therapy Treatment  Patient Details  Name: Jennifer Beck MRN: [id number] Date of Birth: 10-17-1965 Referring Provider: Chauncey Cruel, MD   Encounter Date: 10/31/2017  PT End of Session - 10/31/17 0950    Visit Number  2    Date for PT Re-Evaluation  12/24/17    Authorization - Visit Number  2    Authorization - Number of Visits  20    PT Start Time  0174    PT Stop Time  1014    PT Time Calculation (min)  40 min    Activity Tolerance  Patient tolerated treatment well    Behavior During Therapy  [facility] for tasks assessed/performed       Past Medical History:  Diagnosis Date  . Arthritis   . Chest pain   . Complication of anesthesia   . Endometriosis   . Family history of breast cancer 04/22/2017  . Family history of colon cancer 04/22/2017  . Family history of kidney cancer 04/22/2017  . Fatigue   . GERD (gastroesophageal reflux disease)   . GI bleed    caused by ischemic colitis following an episode of hypertension  . Heart palpitations   . Hx of echocardiogram    a. echo 9/13:  EF 55-60%, Gr 2 diast dysfn  . Hypercholesterolemia   . Ischemic colitis ([place])   . 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 Myocarditis ([place])   . Pericarditis   . PONV (postoperative nausea and vomiting)   . SOB (shortness of breath)     Past Surgical History:  Procedure Laterality Date  . BREAST RECONSTRUCTION WITH PLACEMENT OF TISSUE EXPANDER AND ALLODERM Bilateral 10/08/2017   Procedure: BREAST RECONSTRUCTION WITH PLACEMENT OF TISSUE EXPANDER AND ALLODERM;  Surgeon: [name], MD;  Location: [place];  Service: Plastics;  Laterality: Bilateral;  . CARDIAC CATHETERIZATION  08/26/2001   EF of 60% --- smooth & normal coronary arteries -- there  is a [place] motion defect consistent with posterolateral MI -- she quite possibly had a coronary spasm -- she may have some pericarditis secondary to her MI   . DILATION AND CURETTAGE OF UTERUS  10/15/2003   Uterine enlargement, menorrhagia  . DILATION AND CURETTAGE OF UTERUS  05/27/2002   Dysfunctional uterine bleeding  . LAPAROSCOPY    . MASTECTOMY W/ SENTINEL NODE BIOPSY Bilateral 10/08/2017   Procedure: RIGHT MASTECTOMY WITH RIGHT AXILLARY SENTINEL LYMPH NODE BIOPSY AND LEFT PROPHYLACTIC MASTECTOMY;  Surgeon: [name], MD;  Location: [place];  Service: General;  Laterality: Bilateral;  . NOVASURE ABLATION  10/15/2003   for management of her extreme menorrhagi  . PORTACATH PLACEMENT Right 04/02/2017   Procedure: INSERTION PORT-A-CATH;  Surgeon: [name], MD;  [facility];  Service: General;  Laterality: Right;  . TUBAL LIGATION  09/2000   bilateral    There were no vitals filed for this visit.    Subjective: Burning hasn't been as been as bad when urinating. I am not sure about the self massage  Denies pain              [facility] Adult PT Treatment/Exercise - 10/31/17 1211      Ambulation/Gait   Gait Pattern  Within Functional Limits      Posture/Postural Control  Posture/Postural Control  Postural limitations    Postural Limitations  Rounded Shoulders      Neuro Re-ed    Neuro Re-ed Details   breathing and bulging on ball and during stretches      Lumbar Exercises: Stretches   Active Hamstring Stretch  1 rep;Right;Left;30 seconds    Piriformis Stretch  Right;Left;30 seconds    Other Lumbar Stretch Exercise  rolling to LE bilateral - spikey ball roller      Lumbar Exercises: Quadruped   [place]  10 reps with breathing             PT Education - 10/31/17 1017    Education Details   Access Code: [email or web address] , massage    Person(s) Educated  Patient    Methods  Explanation;Demonstration;Verbal cues;Handout     Comprehension  Verbalized understanding;Returned demonstration       PT Short Term Goals - 10/30/17 0813      PT SHORT TERM GOAL #1   Title  ind with toilet techniques and be able to completely empty with voiding    Time  4    Period  Weeks    Status  New    Target Date  11/26/17      PT SHORT TERM GOAL #2   Title  ind with initial HEP    Time  4    Period  Weeks    Status  New    Target Date  11/26/17        PT Long Term Goals - 10/31/17 1207      PT LONG TERM GOAL #1   Title  ind with advanced HEP    Time  8    Period  Weeks    Status  On-going      PT LONG TERM GOAL #2   Title  Pt will have intercourse and able to void without pain due to knowledge of self care tools for improved tissue health    Time  8    Period  Weeks    Status  On-going      PT LONG TERM GOAL #3   Title  Pt will be able to contract pelvic floor and hold for 10 seconds or more due to improved soft tissue health    Time  8    Period  Weeks    Status  On-going            Plan - 10/31/17 1157    Clinical Impression Statement  Pt was able to understand and repeat back to therapist instructions for self massage to the vulva and vaginal vault.  Pt responded well to diaphragmatic breathing with stretches and was able to feel bulge.  She also responded well to stretches with towel roll for increased ribcage movement. Pt will benefit from skilled PT to work on pelvic floor strength and coordination for improved bladder function.    Rehab Potential  Excellent    PT Treatment/Interventions  ADLs/Self Care Home Management;Biofeedback;Electrical Stimulation;Moist Heat;Cryotherapy;Therapeutic activities;Therapeutic exercise;Neuromuscular re-education;Patient/family education;Passive range of motion;Scar mobilization;Manual techniques;Dry needling;Taping    PT Next Visit Plan  biofeedback for muscle coordination contract/relax/bulge, stretch calf    Consulted and Agree with Plan of Care  Patient        Patient will benefit from skilled therapeutic intervention in order to improve the following deficits and impairments:  Pain, Postural dysfunction, Increased fascial restricitons, Decreased strength, Decreased coordination  Visit Diagnosis: Muscle weakness (generalized)  Unspecified lack of coordination  Problem List Patient Active Problem List   Diagnosis Date Noted  . Genetic testing 05/02/2017  . Family history of breast cancer 04/22/2017  . Family history of colon cancer 04/22/2017  . Family history of kidney cancer 04/22/2017  . Malignant neoplasm of upper-outer quadrant of right breast in female, estrogen receptor negative ([place]) 03/26/2017  . Chest tightness 03/21/2012    Zannie Cove, PT 10/31/2017, 12:13 PM  [facility] [address], [place], [place], [address] Phone: [phone]   Fax:  [phone]  Name: KIRIANA WORTHINGTON MRN: [id number] Date of Birth: 1965-08-21

## 2017-11-06 ENCOUNTER — Ambulatory Visit: Payer: 59 | Admitting: Physical Therapy

## 2017-11-06 DIAGNOSIS — R279 Unspecified lack of coordination: Secondary | ICD-10-CM

## 2017-11-06 DIAGNOSIS — M25611 Stiffness of right shoulder, not elsewhere classified: Secondary | ICD-10-CM

## 2017-11-06 DIAGNOSIS — M25612 Stiffness of left shoulder, not elsewhere classified: Secondary | ICD-10-CM

## 2017-11-06 DIAGNOSIS — M6281 Muscle weakness (generalized): Secondary | ICD-10-CM | POA: Diagnosis not present

## 2017-11-06 DIAGNOSIS — Z483 Aftercare following surgery for neoplasm: Secondary | ICD-10-CM

## 2017-11-06 DIAGNOSIS — R293 Abnormal posture: Secondary | ICD-10-CM

## 2017-11-06 NOTE — Patient Instructions (Signed)
Access Code: [email or web address]  URL: [email or web address]  Date: 11/06/2017  Prepared by: Lovett Calender   Exercises  Supine Diaphragmatic Breathing with Pelvic Floor Lengthening - 10 reps - 3 sets - 1x daily - 7x weekly  Quadruped Cat Camel - 5 reps - 1 sets - 10 sec hold - 1x daily - 7x weekly  Supine Pelvic Floor Stretch - 3 reps - 1 sets - 30 sec hold - 1x daily - 7x weekly  Supine Hamstring Stretch - 3 reps - 1 sets - 30 sec hold - 1x daily - 7x weekly  Double Leg Hamstring Stretch at [place] - 3 reps - 1 sets - 30 sec hold - 1x daily - 7x weekly  Stretching Adductors - 3 reps - 3 sets - 1x daily - 7x weekly  Sidelying Thoracic Rotation with Open Book - 5 reps - 1 sets - 10 sec hold - 1x daily - 7x weekly  Seated Scapular Retraction - 10 reps - 1 sets - 5 sec hold - 3x daily - 7x weekly  Seated Thoracic Lumbar Extension with Pectoralis Stretch - 10 reps - 3 sets - 1x daily - 7x weekly  Supine Chin Tuck - 10 reps - 1 sets - 5 sec hold - 1x daily - 7x weekly  Seated Shoulder W External Rotation on Swiss Ball - 10 reps - 3 sets - 1x daily - 7x weekly

## 2017-11-06 NOTE — Therapy (Signed)
[facility] [address] [place], [place], [address] Phone: [phone]   Fax:  [phone]  Physical Therapy Treatment  Patient Details  Name: Jennifer Beck MRN: [id number] Date of Birth: July 14, 1965 Referring Provider: Dr. Iran Planas , Magrin[place]   Encounter Date: 11/06/2017  PT End of Session - 11/06/17 1133    Visit Number  4    Number of Visits  20    Date for PT Re-Evaluation  12/24/17    Authorization - Visit Number  4    Authorization - Number of Visits  20    PT Start Time  2202    PT Stop Time  1133    PT Time Calculation (min)  75 min    Activity Tolerance  Patient tolerated treatment well    Behavior During Therapy  [facility] for tasks assessed/performed       Past Medical History:  Diagnosis Date  . Arthritis   . Chest pain   . Complication of anesthesia   . Endometriosis   . Family history of breast cancer 04/22/2017  . Family history of colon cancer 04/22/2017  . Family history of kidney cancer 04/22/2017  . Fatigue   . GERD (gastroesophageal reflux disease)   . GI bleed    caused by ischemic colitis following an episode of hypertension  . Heart palpitations   . Hx of echocardiogram    a. echo 9/13:  EF 55-60%, Gr 2 diast dysfn  . Hypercholesterolemia   . Ischemic colitis ([place])   . 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 Myocarditis ([place])   . Pericarditis   . PONV (postoperative nausea and vomiting)   . SOB (shortness of breath)     Past Surgical History:  Procedure Laterality Date  . BREAST RECONSTRUCTION WITH PLACEMENT OF TISSUE EXPANDER AND ALLODERM Bilateral 10/08/2017   Procedure: BREAST RECONSTRUCTION WITH PLACEMENT OF TISSUE EXPANDER AND ALLODERM;  Surgeon: [name], MD;  Location: [place];  Service: Plastics;  Laterality: Bilateral;  . CARDIAC CATHETERIZATION  08/26/2001   EF of 60% --- smooth & normal  coronary arteries -- there is a Larmer motion defect consistent with posterolateral MI -- she quite possibly had a coronary spasm -- she may have some pericarditis secondary to her MI   . DILATION AND CURETTAGE OF UTERUS  10/15/2003   Uterine enlargement, menorrhagia  . DILATION AND CURETTAGE OF UTERUS  05/27/2002   Dysfunctional uterine bleeding  . LAPAROSCOPY    . MASTECTOMY W/ SENTINEL NODE BIOPSY Bilateral 10/08/2017   Procedure: RIGHT MASTECTOMY WITH RIGHT AXILLARY SENTINEL LYMPH NODE BIOPSY AND LEFT PROPHYLACTIC MASTECTOMY;  Surgeon: [name], MD;  Location: [place];  Service: General;  Laterality: Bilateral;  . NOVASURE ABLATION  10/15/2003   for management of her extreme menorrhagi  . PORTACATH PLACEMENT Right 04/02/2017   Procedure: INSERTION PORT-A-CATH;  Surgeon: [name], MD;  Location: [facility];  Service: General;  Laterality: Right;  . TUBAL LIGATION  09/2000   bilateral    There were no vitals filed for this visit.  Subjective Assessment - 11/06/17 1019    Subjective  I haven't had too much burning when urinating.  I have been using the replense and feeling good with everything so far    Currently in Pain?  No/denies  [facility] Adult PT Treatment/Exercise - 11/06/17 0001      Self-Care   Other Self-Care Comments   sitting posture with scapula retraction/depression 10x5 sec hold for improved posure and chest opening      Neuro Re-ed    Neuro Re-ed Details   breathing and bulging with tactile cues, brething with stretches for pelvic floor release      Lumbar Exercises: Sidelying   Other Sidelying Lumbar Exercises  open book with breathing - 5 x 5 breaths      Shoulder Exercises: Supine   External Rotation  Strengthening;Both;20 reps;Theraband    Theraband Level (Shoulder External Rotation)  Level 1 (Yellow)    Other Supine Exercises  cervical retraction - 5 sec x 10       Shoulder Exercises: Pulleys   Flexion   3 minutes cues for posture    ABduction  3 minutes cues for posture      Manual Therapy   Manual Therapy  Soft tissue mobilization;Internal Pelvic Floor    Manual therapy comments  pt informed and consent given to perform internal soft tissue release to trigger points and muscle spasms    Soft tissue mobilization  bilateral pec maj/min Lt>Rt, upper traps and scalenes; careful to avoid port and accessories on right side    Internal Pelvic Floor  levator and coccygeus tight and tender Rt>Lt; TP and ischiocavernosis mild tension Rt side               PT Short Term Goals - 10/31/17 1412      PT SHORT TERM GOAL #3   Title  Pt will have right and left shoulder abduction > 125 degrees so that she can more easily wash her hair     Baseline  rt 104, lt 100 degrees on 10/31/2017     Time  4    Period  Weeks    Status  New        PT Long Term Goals - 10/31/17 1414      PT LONG TERM GOAL #4   Title  Pt will have 160 degrees of bilateral shoulder abduction so that she can return to her ususal activities without pain or diffuculty     Time  8    Period  Weeks    Status  New            Plan - 11/06/17 1256    Clinical Impression Statement  Pt presents with muscle spasms in muscles as mentioned in above treatment.  Pt had good release with soft tissue mobs as described. She did well with all exercises needed cues for imprved posutre and muscle coordination.  Pt will benefit from continued skilled PT to work towards functional goals and return to full function at home and as she returns to work.    PT Treatment/Interventions  ADLs/Self Care Home Management;Biofeedback;Electrical Stimulation;Moist Heat;Cryotherapy;Therapeutic activities;Therapeutic exercise;Neuromuscular re-education;Patient/family education;Passive range of motion;Scar mobilization;Manual techniques;Dry needling;Taping    PT Next Visit Plan  biofeedback for muscle coordination contract/relax/bulge, stretch calf,  postureal exercises, shoudler A/AA/PROM, progressive strengthening to UE's , Ask if pt called [facility] to sign up for ABC class     PT Home Exercise Plan  [place] and Agree with Plan of Care  Patient       Patient will benefit from skilled therapeutic intervention in order to improve the following deficits and impairments:  Pain, Postural dysfunction, Increased fascial restricitons, Decreased strength, Decreased coordination, Decreased knowledge of  precautions, Decreased knowledge of use of DME, Decreased range of motion  Visit Diagnosis: Aftercare following surgery for neoplasm  Stiffness of right shoulder joint  Stiffness of left shoulder joint  Muscle weakness (generalized)  Abnormal posture  Unspecified lack of coordination     Problem List Patient Active Problem List   Diagnosis Date Noted  . Genetic testing 05/02/2017  . Family history of breast cancer 04/22/2017  . Family history of colon cancer 04/22/2017  . Family history of kidney cancer 04/22/2017  . Malignant neoplasm of upper-outer quadrant of right breast in female, estrogen receptor negative ([place]) 03/26/2017  . Chest tightness 03/21/2012    Zannie Cove, PT 11/06/2017, 12:59 PM  [facility] [address] [place], [place], [address] Phone: [phone]   Fax:  [phone]  Name: Jennifer Beck MRN: [id number] Date of Birth: 1966-01-07

## 2017-11-07 ENCOUNTER — Encounter: Payer: Self-pay | Admitting: *Deleted

## 2017-11-08 ENCOUNTER — Other Ambulatory Visit: Payer: Self-pay | Admitting: *Deleted

## 2017-11-08 MED ORDER — ESTRADIOL 10 MCG VA TABS: ORAL_TABLET | VAGINAL | 0 refills | Status: DC

## 2017-11-11 ENCOUNTER — Ambulatory Visit: Payer: 59 | Admitting: Physical Therapy

## 2017-11-11 ENCOUNTER — Encounter: Payer: Self-pay | Admitting: Physical Therapy

## 2017-11-11 DIAGNOSIS — R293 Abnormal posture: Secondary | ICD-10-CM

## 2017-11-11 DIAGNOSIS — M6281 Muscle weakness (generalized): Secondary | ICD-10-CM

## 2017-11-11 DIAGNOSIS — Z483 Aftercare following surgery for neoplasm: Secondary | ICD-10-CM

## 2017-11-11 DIAGNOSIS — M25611 Stiffness of right shoulder, not elsewhere classified: Secondary | ICD-10-CM

## 2017-11-11 DIAGNOSIS — M25612 Stiffness of left shoulder, not elsewhere classified: Secondary | ICD-10-CM

## 2017-11-11 NOTE — Therapy (Signed)
[facility] [address], [place], [place], [address] Phone: [phone]   Fax:  [phone]  Physical Therapy Treatment  Patient Details  Name: Jennifer Beck MRN: [id number] Date of Birth: [birth year] Referring Provider: Dr. Iran Planas , Magrin[place]   Encounter Date: 11/11/2017  PT End of Session - 11/11/17 0805    Visit Number  5    Date for PT Re-Evaluation  12/24/17    Authorization - Visit Number  5    Authorization - Number of Visits  20    PT Start Time  0800    PT Stop Time  0845    PT Time Calculation (min)  45 min    Activity Tolerance  Patient tolerated treatment well    Behavior During Therapy  [facility] for tasks assessed/performed       Past Medical History:  Diagnosis Date  . Arthritis   . Chest pain   . Complication of anesthesia   . Endometriosis   . Family history of breast cancer 04/22/2017  . Family history of colon cancer 04/22/2017  . Family history of kidney cancer 04/22/2017  . Fatigue   . GERD (gastroesophageal reflux disease)   . GI bleed    caused by ischemic colitis following an episode of hypertension  . Heart palpitations   . Hx of echocardiogram    a. echo 9/13:  EF 55-60%, Gr 2 diast dysfn  . Hypercholesterolemia   . Ischemic colitis ([place])   . 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 Myocarditis ([place])   . Pericarditis   . PONV (postoperative nausea and vomiting)   . SOB (shortness of breath)     Past Surgical History:  Procedure Laterality Date  . BREAST RECONSTRUCTION WITH PLACEMENT OF TISSUE EXPANDER AND ALLODERM Bilateral 10/08/2017   Procedure: BREAST RECONSTRUCTION WITH PLACEMENT OF TISSUE EXPANDER AND ALLODERM;  Surgeon: [name], MD;  Location: [facility];  Service: Plastics;  Laterality: Bilateral;  . CARDIAC CATHETERIZATION  08/26/2001   EF of 60% --- smooth & normal coronary arteries --  there is a Mcgovern motion defect consistent with posterolateral MI -- she quite possibly had a coronary spasm -- she may have some pericarditis secondary to her MI   . DILATION AND CURETTAGE OF UTERUS  10/15/2003   Uterine enlargement, menorrhagia  . DILATION AND CURETTAGE OF UTERUS  05/27/2002   Dysfunctional uterine bleeding  . LAPAROSCOPY    . MASTECTOMY W/ SENTINEL NODE BIOPSY Bilateral 10/08/2017   Procedure: RIGHT MASTECTOMY WITH RIGHT AXILLARY SENTINEL LYMPH NODE BIOPSY AND LEFT PROPHYLACTIC MASTECTOMY;  Surgeon: [name], MD;  Location: [place];  Service: General;  Laterality: Bilateral;  . NOVASURE ABLATION  10/15/2003   for management of her extreme menorrhagi  . PORTACATH PLACEMENT Right 04/02/2017   Procedure: INSERTION PORT-A-CATH;  Surgeon: [name], MD;  [facility];  Service: General;  Laterality: Right;  . TUBAL LIGATION  09/2000   bilateral    There were no vitals filed for this visit.  Subjective Assessment - 11/11/17 0803    Subjective  I am going back to work full time today.  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  Pt will not have to have radiation      Currently in Pain?  No/denies         [facility] PT Assessment - 11/11/17 0001      AROM   Right Shoulder Flexion  156 Degrees    Right Shoulder ABduction  157 Degrees    Left Shoulder Flexion  152 Degrees    Left Shoulder ABduction  124 Degrees                   [facility] Adult PT Treatment/Exercise - 11/11/17 0001      Lumbar Exercises: Standing   Row  Strengthening;20 reps;Theraband    Theraband Level (Row)  Level 1 (Yellow)    Shoulder Extension  Strengthening;20 reps;Theraband    Theraband  Level (Shoulder Extension)  Level 2 (Red)    Other Standing Lumbar Exercises  horizontal abduction - yellow band      Lumbar Exercises: Seated   Other Seated Lumbar Exercises  shoulder table slides flexion and abduction      Shoulder Exercises: Pulleys   Flexion  3 minutes    ABduction  3 minutes      Manual Therapy   Soft tissue mobilization  bilateral pec maj/min Lt>Rt, upper traps and scalenes; careful to avoid port and accessories on right side      cues for posture throughout exercises       PT Education - 11/11/17 0850    Education provided  Yes    Education Details   Access Code: [email or web address]     Person(s) Educated  Patient    Methods  Explanation;Demonstration;Handout    Comprehension  Verbalized understanding;Returned demonstration       PT Short Term Goals - 10/31/17 1412      PT SHORT TERM GOAL #3   Title  Pt will have right and left shoulder abduction > 125 degrees so that she can more easily wash her hair     Baseline  rt 104, lt 100 degrees on 10/31/2017     Time  4    Period  Weeks    Status  New        PT Long Term Goals - 11/11/17 0839      PT LONG TERM GOAL #2   Title  Pt will have intercourse and able to void without pain due to knowledge of self care tools for improved tissue health    Baseline  some pain after a while but no pain during most of the time; no issue with urination    Status  Partially Met      PT LONG TERM GOAL #3   Title  Pt will be able to contract pelvic floor and hold for 10 seconds or more due to improved soft tissue health    Time  8    Period  Weeks    Status  On-going      PT LONG TERM GOAL #4   Title  Pt will have 160 degrees of bilateral shoulder abduction so that she can return to her ususal activities without pain or diffuculty     Baseline  124; Lt    Time  8    Period  Weeks    Status  On-going            Plan - 11/11/17 4098    Clinical Impression Statement  Pt is making good progress towards her goals.  She is having no difficulty with urination and improved with AROM throughout bilateral shoulders.  Pt benefit from skilled PT to progress postural strength and ROM.    PT Treatment/Interventions  ADLs/Self Care Home Management;Biofeedback;Electrical Stimulation;Moist Heat;Cryotherapy;Therapeutic activities;Therapeutic exercise;Neuromuscular re-education;Patient/family education;Passive range of motion;Scar mobilization;Manual techniques;Dry needling;Taping    PT Next Visit Plan  stretch calf, postureal exercises, shoudler A/AA/PROM, progressive strengthening to [place] and Agree with Plan of Care  Patient       Patient will benefit from skilled therapeutic intervention in order to improve the following deficits and impairments:  Pain, Postural dysfunction, Increased fascial restricitons, Decreased strength, Decreased coordination, Decreased knowledge of precautions, Decreased knowledge of use of DME, Decreased range of motion  Visit Diagnosis: Aftercare following surgery for neoplasm  Stiffness of right shoulder joint  Stiffness of left shoulder joint  Muscle weakness (generalized)  Abnormal posture     Problem List Patient Active Problem List   Diagnosis Date Noted  . Genetic testing 05/02/2017  . Family history of breast cancer 04/22/2017  . Family history of colon cancer 04/22/2017  . Family history of kidney cancer 04/22/2017  . Malignant neoplasm of upper-outer quadrant of right breast in female, estrogen receptor negative ([place]) 03/26/2017  . Chest tightness 03/21/2012    Zannie Cove, PT 11/11/2017, 9:31 AM  [facility] [address] [place], [place], [address] Phone: [phone]   Fax:  [phone]  Name: Jennifer Beck MRN: [id number] Date of Birth: 1965/12/07

## 2017-11-11 NOTE — Patient Instructions (Signed)
Access Code: [email or web address]  URL: [email or web address]  Date: 11/11/2017  Prepared by: Lovett Calender   Exercises  Supine Diaphragmatic Breathing with Pelvic Floor Lengthening - 10 reps - 3 sets - 1x daily - 7x weekly  Quadruped Cat Camel - 5 reps - 1 sets - 10 sec hold - 1x daily - 7x weekly  Supine Pelvic Floor Stretch - 3 reps - 1 sets - 30 sec hold - 1x daily - 7x weekly  Supine Hamstring Stretch - 3 reps - 1 sets - 30 sec hold - 1x daily - 7x weekly  Double Leg Hamstring Stretch at Towe - 3 reps - 1 sets - 30 sec hold - 1x daily - 7x weekly  Stretching Adductors - 3 reps - 3 sets - 1x daily - 7x weekly  Sidelying Thoracic Rotation with Open Book - 5 reps - 1 sets - 10 sec hold - 1x daily - 7x weekly  Seated Scapular Retraction - 10 reps - 1 sets - 5 sec hold - 3x daily - 7x weekly  Seated Thoracic Lumbar Extension with Pectoralis Stretch - 10 reps - 3 sets - 1x daily - 7x weekly  Supine Chin Tuck - 10 reps - 1 sets - 5 sec hold - 1x daily - 7x weekly  Seated Shoulder W External Rotation on Swiss Ball - 10 reps - 3 sets - 1x daily - 7x weekly  Seated Shoulder Horizontal Abduction with Resistance - Palms Down - 10 reps - 3 sets - 1x daily - 7x weekly  Seated Shoulder Flexion Slide at Table Top with Forearm in Neutral - 10 reps - 1 sets - 5 sec hold - 3x daily - 7x weekly  Seated Shoulder Abduction Towel Slide at Table Top with Forearm in Neutral - 10 reps - 3 sets - 1x daily - 7x weekly  Seated Shoulder External Rotation PROM on Table - 10 reps - 3 sets - 1x daily - 7x weekly

## 2017-11-13 ENCOUNTER — Encounter: Payer: Self-pay | Admitting: Physical Therapy

## 2017-11-13 ENCOUNTER — Ambulatory Visit: Payer: 59 | Admitting: Physical Therapy

## 2017-11-13 DIAGNOSIS — M25612 Stiffness of left shoulder, not elsewhere classified: Secondary | ICD-10-CM

## 2017-11-13 DIAGNOSIS — M6281 Muscle weakness (generalized): Secondary | ICD-10-CM

## 2017-11-13 DIAGNOSIS — R279 Unspecified lack of coordination: Secondary | ICD-10-CM

## 2017-11-13 DIAGNOSIS — M25611 Stiffness of right shoulder, not elsewhere classified: Secondary | ICD-10-CM

## 2017-11-13 DIAGNOSIS — R293 Abnormal posture: Secondary | ICD-10-CM

## 2017-11-13 DIAGNOSIS — Z483 Aftercare following surgery for neoplasm: Secondary | ICD-10-CM

## 2017-11-13 NOTE — Therapy (Signed)
[facility] [address] [place], [place], [address] Phone: [phone]   Fax:  [phone]  Physical Therapy Treatment  Patient Details  Name: Jennifer Beck MRN: [id number] Date of Birth: Jun 20, 1965 Referring Provider: Dr. Iran Planas , Magrin[place]   Encounter Date: 11/13/2017  PT End of Session - 11/13/17 0844    Visit Number  6    Number of Visits  20    Date for PT Re-Evaluation  12/24/17    Authorization - Visit Number  6    Authorization - Number of Visits  20    PT Start Time  0801    PT Stop Time  0630    PT Time Calculation (min)  43 min    Activity Tolerance  Patient tolerated treatment well    Behavior During Therapy  [facility] for tasks assessed/performed       Past Medical History:  Diagnosis Date  . Arthritis   . Chest pain   . Complication of anesthesia   . Endometriosis   . Family history of breast cancer 04/22/2017  . Family history of colon cancer 04/22/2017  . Family history of kidney cancer 04/22/2017  . Fatigue   . GERD (gastroesophageal reflux disease)   . GI bleed    caused by ischemic colitis following an episode of hypertension  . Heart palpitations   . Hx of echocardiogram    a. echo 9/13:  EF 55-60%, Gr 2 diast dysfn  . Hypercholesterolemia   . Ischemic colitis ([place])   . 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 Myocarditis ([place])   . Pericarditis   . PONV (postoperative nausea and vomiting)   . SOB (shortness of breath)     Past Surgical History:  Procedure Laterality Date  . BREAST RECONSTRUCTION WITH PLACEMENT OF TISSUE EXPANDER AND ALLODERM Bilateral 10/08/2017   Procedure: BREAST RECONSTRUCTION WITH PLACEMENT OF TISSUE EXPANDER AND ALLODERM;  Surgeon: [name], MD;  Location: [place];  Service: Plastics;  Laterality: Bilateral;  . CARDIAC CATHETERIZATION  08/26/2001   EF of 60% --- smooth & normal  coronary arteries -- there is a [place] motion defect consistent with posterolateral MI -- she quite possibly had a coronary spasm -- she may have some pericarditis secondary to her MI   . DILATION AND CURETTAGE OF UTERUS  10/15/2003   Uterine enlargement, menorrhagia  . DILATION AND CURETTAGE OF UTERUS  05/27/2002   Dysfunctional uterine bleeding  . LAPAROSCOPY    . MASTECTOMY W/ SENTINEL NODE BIOPSY Bilateral 10/08/2017   Procedure: RIGHT MASTECTOMY WITH RIGHT AXILLARY SENTINEL LYMPH NODE BIOPSY AND LEFT PROPHYLACTIC MASTECTOMY;  Surgeon: [name], MD;  Location: [place];  Service: General;  Laterality: Bilateral;  . NOVASURE ABLATION  10/15/2003   for management of her extreme menorrhagi  . PORTACATH PLACEMENT Right 04/02/2017   Procedure: INSERTION PORT-A-CATH;  Surgeon: [name], MD;  [facility];  Service: General;  Laterality: Right;  . TUBAL LIGATION  09/2000   bilateral    There were no vitals filed for this visit.  Subjective Assessment - 11/13/17 0807    Subjective  I was really stiff and sore in my neck after previous session. Feeling much better now, still a tiny bit of stiffness when I turn my head.    Currently in Pain?  No/denies         [facility] PT Assessment - 11/13/17  0001      AROM   Right Shoulder Flexion  150 Degrees    Left Shoulder Flexion  144 Degrees                   [facility] Adult PT Treatment/Exercise - 11/13/17 0001      Shoulder Exercises: Supine   Horizontal ABduction  Strengthening;Both;20 reps;Theraband    Theraband Level (Shoulder Horizontal ABduction)  Level 1 (Yellow)    External Rotation  Strengthening;Both;20 reps;Theraband    Theraband Level (Shoulder External Rotation)  Level 1 (Yellow)    Diagonals  Strengthening;Right;Left;20 reps;Theraband    Theraband Level (Shoulder Diagonals)  Level 1 (Yellow)    Other Supine Exercises  snow angels 10x 5 sec hold      Shoulder Exercises: ROM/Strengthening    UBE (Upper Arm Bike)  L1 2 x 2 fwd/back PT present for status update    Other ROM/Strengthening Exercises  UE ranger scaption level 30 bilateral 20x each side      Manual Therapy   Manual Therapy  Soft tissue mobilization    Soft tissue mobilization   Left shoulder - serratus, subscap, lats      soft tissue mobilization : Left shoulder - serratus, subscap, lats       PT Education - 11/13/17 1022    Education provided  Yes    Education Details  added to  Access Code: [email or web address]     Person(s) Educated  Patient    Methods  Explanation;Demonstration;Handout;Verbal cues;Tactile cues    Comprehension  Verbalized understanding;Returned demonstration       PT Short Term Goals - 11/13/17 1034      PT SHORT TERM GOAL #1   Title  ind with toilet techniques and be able to completely empty with voiding    Time  4    Period  Weeks    Status  Achieved      PT SHORT TERM GOAL #2   Title  ind with initial HEP    Time  4    Period  Weeks    Status  Achieved      PT SHORT TERM GOAL #3   Title  Pt will have right and left shoulder abduction > 125 degrees so that she can more easily wash her hair     Baseline  flexion >150    Time  4    Period  Weeks    Status  On-going        PT Long Term Goals - 11/11/17 0839      PT LONG TERM GOAL #2   Title  Pt will have intercourse and able to void without pain due to knowledge of self care tools for improved tissue health    Baseline  some pain after a while but no pain during most of the time; no issue with urination    Status  Partially Met      PT LONG TERM GOAL #3   Title  Pt will be able to contract pelvic floor and hold for 10 seconds or more due to improved soft tissue health    Time  8    Period  Weeks    Status  On-going      PT LONG TERM GOAL #4   Title  Pt will have 160 degrees of bilateral shoulder abduction so that she can return to her ususal activities without pain or diffuculty     Baseline  124; Lt    Time  [place]    Status  On-going            Plan - 11/13/17 0847    Clinical Impression Statement  Pt had increased ROM after manual 155 from 144 flexion on left side.  Pt is doing well with porgressing exercises.  She continues to need cues for posture.  She will benefit from skilled PT to progress posture, strength and ROM.      PT Treatment/Interventions  ADLs/Self Care Home Management;Biofeedback;Electrical Stimulation;Moist Heat;Cryotherapy;Therapeutic activities;Therapeutic exercise;Neuromuscular re-education;Patient/family education;Passive range of motion;Scar mobilization;Manual techniques;Dry needling;Taping    PT Next Visit Plan  f/u on pelvic floor and adding more stretches throughout the day, STM to serratus, subscap, lats, progress resistance band exercises for upper back    PT Home Exercise Plan  [email or web address]    Consulted and Agree with Plan of Care  Patient       Patient will benefit from skilled therapeutic intervention in order to improve the following deficits and impairments:  Pain, Postural dysfunction, Increased fascial restricitons, Decreased strength, Decreased coordination, Decreased knowledge of precautions, Decreased knowledge of use of DME, Decreased range of motion  Visit Diagnosis: Aftercare following surgery for neoplasm  Stiffness of right shoulder joint  Stiffness of left shoulder joint  Muscle weakness (generalized)  Abnormal posture  Unspecified lack of coordination     Problem List Patient Active Problem List   Diagnosis Date Noted  . Genetic testing 05/02/2017  . Family history of breast cancer 04/22/2017  . Family history of colon cancer 04/22/2017  . Family history of kidney cancer 04/22/2017  . Malignant neoplasm of upper-outer quadrant of right breast in female, estrogen receptor negative ([place]) 03/26/2017  . Chest tightness 03/21/2012    Zannie Cove, PT 11/13/2017, 10:40 AM  [facility] [address] [place], [place], [address] Phone: [phone]   Fax:  [phone]  Name: Jennifer Beck MRN: [id number] Date of Birth: 07-Sep-1965

## 2017-11-13 NOTE — Patient Instructions (Signed)
Access Code: [email or web address]  URL: [email or web address]  Date: 11/13/2017  Prepared by: Lovett Calender   Exercises  Supine Diaphragmatic Breathing with Pelvic Floor Lengthening - 10 reps - 3 sets - 1x daily - 7x weekly  Quadruped Cat Camel - 5 reps - 1 sets - 10 sec hold - 1x daily - 7x weekly  Supine Pelvic Floor Stretch - 3 reps - 1 sets - 30 sec hold - 1x daily - 7x weekly  Supine Hamstring Stretch - 3 reps - 1 sets - 30 sec hold - 1x daily - 7x weekly  Double Leg Hamstring Stretch at [place] - 3 reps - 1 sets - 30 sec hold - 1x daily - 7x weekly  Stretching Adductors - 3 reps - 3 sets - 1x daily - 7x weekly  Sidelying Thoracic Rotation with Open Book - 5 reps - 1 sets - 10 sec hold - 1x daily - 7x weekly  Seated Scapular Retraction - 10 reps - 1 sets - 5 sec hold - 3x daily - 7x weekly  Seated Thoracic Lumbar Extension with Pectoralis Stretch - 10 reps - 3 sets - 1x daily - 7x weekly  Supine Chin Tuck - 10 reps - 1 sets - 5 sec hold - 1x daily - 7x weekly  Seated Shoulder W External Rotation on Swiss Ball - 10 reps - 3 sets - 1x daily - 7x weekly  Seated Shoulder Horizontal Abduction with Resistance - Palms Down - 10 reps - 3 sets - 1x daily - 7x weekly  Seated Shoulder Flexion Slide at Table Top with Forearm in Neutral - 10 reps - 1 sets - 5 sec hold - 3x daily - 7x weekly  Seated Shoulder Abduction Towel Slide at Table Top with Forearm in Neutral - 10 reps - 3 sets - 1x daily - 7x weekly  Seated Shoulder External Rotation PROM on Table - 10 reps - 3 sets - 1x daily - 7x weekly  Half Kneeling Hip Flexor Stretch with Tri-Planar Reach - 3 reps - 1 sets - 30 sec hold - 1x daily - 7x weekly  Standing Hamstring Stretch with Step - 3 reps - 1 sets - 30 sec hold - 1x daily - 7x weekly  Standing Gastroc Stretch - 3 reps - 1 sets - 30 sec hold - 1x daily - 7x weekly

## 2017-11-18 ENCOUNTER — Ambulatory Visit: Payer: 59 | Admitting: Physical Therapy

## 2017-11-18 ENCOUNTER — Encounter: Payer: Self-pay | Admitting: Physical Therapy

## 2017-11-18 DIAGNOSIS — R293 Abnormal posture: Secondary | ICD-10-CM

## 2017-11-18 DIAGNOSIS — Z483 Aftercare following surgery for neoplasm: Secondary | ICD-10-CM

## 2017-11-18 DIAGNOSIS — M25612 Stiffness of left shoulder, not elsewhere classified: Secondary | ICD-10-CM

## 2017-11-18 DIAGNOSIS — R279 Unspecified lack of coordination: Secondary | ICD-10-CM

## 2017-11-18 DIAGNOSIS — M6281 Muscle weakness (generalized): Secondary | ICD-10-CM

## 2017-11-18 DIAGNOSIS — M25611 Stiffness of right shoulder, not elsewhere classified: Secondary | ICD-10-CM

## 2017-11-18 NOTE — Patient Instructions (Signed)
Access Code: [email or web address]  URL: [email or web address]  Date: 11/18/2017  Prepared by: Lovett Calender   Exercises  Supine Diaphragmatic Breathing with Pelvic Floor Lengthening - 10 reps - 3 sets - 1x daily - 7x weekly  Supine Chin Tuck - 10 reps - 1 sets - 5 sec hold - 1x daily - 7x weekly  Quadruped Cat Camel - 5 reps - 1 sets - 10 sec hold - 1x daily - 7x weekly  Supine Pelvic Floor Stretch - 3 reps - 1 sets - 30 sec hold - 1x daily - 7x weekly  Supine Hamstring Stretch - 3 reps - 1 sets - 30 sec hold - 1x daily - 7x weekly  Double Leg Hamstring Stretch at [place] - 3 reps - 1 sets - 30 sec hold - 1x daily - 7x weekly  Stretching Adductors - 3 reps - 3 sets - 1x daily - 7x weekly  Sidelying Thoracic Rotation with Open Book - 5 reps - 1 sets - 10 sec hold - 1x daily - 7x weekly  Seated Scapular Retraction - 10 reps - 1 sets - 5 sec hold - 3x daily - 7x weekly  Seated Cervical Rotation AROM - 10 reps - 1 sets - 1x daily - 7x weekly  Seated Cervical Sidebending Stretch - 3 reps - 1 sets - 30 sec hold - 1x daily - 7x weekly  Seated Thoracic Lumbar Extension with Pectoralis Stretch - 10 reps - 3 sets - 1x daily - 7x weekly  Seated Shoulder Flexion Slide at Table Top with Forearm in Neutral - 10 reps - 1 sets - 5 sec hold - 3x daily - 7x weekly  Seated Shoulder Abduction Towel Slide at Table Top with Forearm in Neutral - 10 reps - 3 sets - 1x daily - 7x weekly  Seated Shoulder External Rotation PROM on Table - 10 reps - 3 sets - 1x daily - 7x weekly  Half Kneeling Hip Flexor Stretch with Tri-Planar Reach - 3 reps - 1 sets - 30 sec hold - 1x daily - 7x weekly  Standing Hamstring Stretch with Step - 3 reps - 1 sets - 30 sec hold - 1x daily - 7x weekly  Standing Gastroc Stretch - 3 reps - 1 sets - 30 sec hold - 1x daily - 7x weekly  Seated Shoulder Horizontal Abduction with Resistance - Palms Down - 10 reps - 1 sets - 1x daily - 7x weekly  Seated Shoulder W External Rotation on Swiss Ball  - 10 reps - 1 sets - 1x daily - 7x weekly  Standing Shoulder Row with Anchored Resistance - 10 reps - 1 sets - 1x daily - 7x weekly  Standing Shoulder Extension with Resistance - 10 reps - 1 sets - 1x daily - 7x weekly

## 2017-11-18 NOTE — Therapy (Signed)
[facility] [address], [place], [place], [address] Phone: [phone]   Fax:  [phone]  Physical Therapy Treatment  Patient Details  Name: Jennifer Beck MRN: [id number] Date of Birth: [birth year] Referring Provider: Dr. Iran Planas , [place]   Encounter Date: 11/18/2017  PT End of Session - 11/18/17 0759    Visit Number  7    Number of Visits  20    Date for PT Re-Evaluation  12/24/17    Authorization - Visit Number  7    Authorization - Number of Visits  20    PT Start Time  0800    PT Stop Time  0842    PT Time Calculation (min)  42 min    Activity Tolerance  Patient tolerated treatment well    Behavior During Therapy  [facility] for tasks assessed/performed       Past Medical History:  Diagnosis Date  . Arthritis   . Chest pain   . Complication of anesthesia   . Endometriosis   . Family history of breast cancer 04/22/2017  . Family history of colon cancer 04/22/2017  . Family history of kidney cancer 04/22/2017  . Fatigue   . GERD (gastroesophageal reflux disease)   . GI bleed    caused by ischemic colitis following an episode of hypertension  . Heart palpitations   . Hx of echocardiogram    a. echo 9/13:  EF 55-60%, Gr 2 diast dysfn  . Hypercholesterolemia   . Ischemic colitis ([place])   . 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 Myocarditis ([place])   . Pericarditis   . PONV (postoperative nausea and vomiting)   . SOB (shortness of breath)     Past Surgical History:  Procedure Laterality Date  . BREAST RECONSTRUCTION WITH PLACEMENT OF TISSUE EXPANDER AND ALLODERM Bilateral 10/08/2017   Procedure: BREAST RECONSTRUCTION WITH PLACEMENT OF TISSUE EXPANDER AND ALLODERM;  Surgeon: [name], MD;  Location: [place];  Service: Plastics;  Laterality: Bilateral;  . CARDIAC CATHETERIZATION  08/26/2001   EF of 60% --- smooth & normal  coronary arteries -- there is a [place] motion defect consistent with posterolateral MI -- she quite possibly had a coronary spasm -- she may have some pericarditis secondary to her MI   . DILATION AND CURETTAGE OF UTERUS  10/15/2003   Uterine enlargement, menorrhagia  . DILATION AND CURETTAGE OF UTERUS  05/27/2002   Dysfunctional uterine bleeding  . LAPAROSCOPY    . MASTECTOMY W/ SENTINEL NODE BIOPSY Bilateral 10/08/2017   Procedure: RIGHT MASTECTOMY WITH RIGHT AXILLARY SENTINEL LYMPH NODE BIOPSY AND LEFT PROPHYLACTIC MASTECTOMY;  Surgeon: [name], MD;  Location: [place];  Service: General;  Laterality: Bilateral;  . NOVASURE ABLATION  10/15/2003   for management of her extreme menorrhagi  . PORTACATH PLACEMENT Right 04/02/2017   Procedure: INSERTION PORT-A-CATH;  Surgeon: [name], MD;  [facility];  Service: General;  Laterality: Right;  . TUBAL LIGATION  09/2000   bilateral    There were no vitals filed for this visit.  Subjective Assessment - 11/18/17 0804    Subjective  I feel a little sore and stiff after being in the car.  My neck is a little stiff and the left shoulder still feels more stiff.         [facility] PT Assessment - 11/18/17 0001      AROM  Right Shoulder Flexion  154 Degrees    Right Shoulder ABduction  146 Degrees    Left Shoulder Flexion  160 Degrees    Left Shoulder ABduction  125 Degrees                   [facility] Adult PT Treatment/Exercise - 11/18/17 0001      Lumbar Exercises: Standing   Row  Strengthening;20 reps;Theraband    Theraband Level (Row)  Level 3 (Green)    Shoulder Extension  Strengthening;20 reps;Theraband    Theraband Level (Shoulder Extension)  Level 2 (Red)    Other Standing Lumbar Exercises  horizontal abduction - red band      Shoulder Exercises: ROM/Strengthening   UBE (Upper Arm Bike)  L1 3 x 3 fwd/back PT present for status update    Other ROM/Strengthening Exercises  upper trap and cervical  rotation stretches - 3 x 20 sec      Manual Therapy   Manual Therapy  Soft tissue mobilization    Soft tissue mobilization   Left shoulder - serratus, subscap, lats, pecs      bilateral shoulder ER x 30 red band Rolling ball up the [place] 10x 5 sec hold  Educated on posture and cervical retraction throughout above exercises       PT Education - 11/18/17 0846    Education provided  Yes    Education Details   Access Code: [email or web address]     Person(s) Educated  Patient    Methods  Explanation;Handout;Verbal cues;Tactile cues;Demonstration    Comprehension  Verbalized understanding;Returned demonstration       PT Short Term Goals - 11/13/17 1034      PT SHORT TERM GOAL #1   Title  ind with toilet techniques and be able to completely empty with voiding    Time  4    Period  Weeks    Status  Achieved      PT SHORT TERM GOAL #2   Title  ind with initial HEP    Time  4    Period  Weeks    Status  Achieved      PT SHORT TERM GOAL #3   Title  Pt will have right and left shoulder abduction > 125 degrees so that she can more easily wash her hair     Baseline  flexion >150    Time  4    Period  Weeks    Status  On-going        PT Long Term Goals - 11/11/17 0839      PT LONG TERM GOAL #2   Title  Pt will have intercourse and able to void without pain due to knowledge of self care tools for improved tissue health    Baseline  some pain after a while but no pain during most of the time; no issue with urination    Status  Partially Met      PT LONG TERM GOAL #3   Title  Pt will be able to contract pelvic floor and hold for 10 seconds or more due to improved soft tissue health    Time  8    Period  Weeks    Status  On-going      PT LONG TERM GOAL #4   Title  Pt will have 160 degrees of bilateral shoulder abduction so that she can return to her ususal activities without pain or diffuculty     Baseline  124; Lt  Time  8    Period  Weeks    Status  On-going             Plan - 11/18/17 2924    Clinical Impression Statement  Pt demonstrates improved ROM with left flexion 160 degrees before manual treatment.  Pt was educated on using bands at home for HEP and able to progress resistance.  Pt is doing well and having less pelvic pain 0/3 on Marinoff scale.  Pt will benefit from skilled PT for improved ROM bilateral shoulders for maximum function.    PT Treatment/Interventions  ADLs/Self Care Home Management;Biofeedback;Electrical Stimulation;Moist Heat;Cryotherapy;Therapeutic activities;Therapeutic exercise;Neuromuscular re-education;Patient/family education;Passive range of motion;Scar mobilization;Manual techniques;Dry needling;Taping    PT Next Visit Plan   shoulder ROM, STM to serratus, subscap, lats, progress resistance band exercises for upper back    PT Home Exercise Plan  [place] and Agree with Plan of Care  Patient       Patient will benefit from skilled therapeutic intervention in order to improve the following deficits and impairments:  Pain, Postural dysfunction, Increased fascial restricitons, Decreased strength, Decreased coordination, Decreased knowledge of precautions, Decreased knowledge of use of DME, Decreased range of motion  Visit Diagnosis: Stiffness of right shoulder joint  Stiffness of left shoulder joint  Muscle weakness (generalized)  Abnormal posture  Unspecified lack of coordination  Aftercare following surgery for neoplasm     Problem List Patient Active Problem List   Diagnosis Date Noted  . Genetic testing 05/02/2017  . Family history of breast cancer 04/22/2017  . Family history of colon cancer 04/22/2017  . Family history of kidney cancer 04/22/2017  . Malignant neoplasm of upper-outer quadrant of right breast in female, estrogen receptor negative ([place]) 03/26/2017  . Chest tightness 03/21/2012    Zannie Cove, PT 11/18/2017, 8:49 AM  [facility] [address], [place], [place], [address] Phone: [phone]   Fax:  [phone]  Name: Jennifer Beck MRN: [id number] Date of Birth: Nov 13, 1965

## 2017-11-19 DIAGNOSIS — Z853 Personal history of malignant neoplasm of breast: Secondary | ICD-10-CM | POA: Diagnosis not present

## 2017-11-19 DIAGNOSIS — Z452 Encounter for adjustment and management of vascular access device: Secondary | ICD-10-CM | POA: Diagnosis not present

## 2017-11-21 ENCOUNTER — Encounter: Payer: 59 | Admitting: Physical Therapy

## 2017-11-25 ENCOUNTER — Ambulatory Visit: Payer: 59 | Attending: Oncology | Admitting: Physical Therapy

## 2017-11-25 ENCOUNTER — Encounter: Payer: Self-pay | Admitting: Physical Therapy

## 2017-11-25 DIAGNOSIS — M25611 Stiffness of right shoulder, not elsewhere classified: Secondary | ICD-10-CM

## 2017-11-25 DIAGNOSIS — R293 Abnormal posture: Secondary | ICD-10-CM | POA: Diagnosis present

## 2017-11-25 DIAGNOSIS — R279 Unspecified lack of coordination: Secondary | ICD-10-CM | POA: Diagnosis present

## 2017-11-25 DIAGNOSIS — M25612 Stiffness of left shoulder, not elsewhere classified: Secondary | ICD-10-CM

## 2017-11-25 DIAGNOSIS — M6281 Muscle weakness (generalized): Secondary | ICD-10-CM

## 2017-11-25 NOTE — Patient Instructions (Signed)
Access Code: [email or web address]  URL: [email or web address]  Date: 11/25/2017  Prepared by: Lovett Calender   Exercises  Supine Diaphragmatic Breathing with Pelvic Floor Lengthening - 10 reps - 3 sets - 1x daily - 7x weekly  Supine Chin Tuck - 10 reps - 1 sets - 5 sec hold - 1x daily - 7x weekly  Quadruped Cat Camel - 5 reps - 1 sets - 10 sec hold - 1x daily - 7x weekly  Quadruped Full Range Thoracic Rotation with Reach - 10 reps - 1 sets - 5 sec hold - 1x daily - 7x weekly  Supine Pelvic Floor Stretch - 3 reps - 1 sets - 30 sec hold - 1x daily - 7x weekly  Supine Hamstring Stretch - 3 reps - 1 sets - 30 sec hold - 1x daily - 7x weekly  Double Leg Hamstring Stretch at [place] - 3 reps - 1 sets - 30 sec hold - 1x daily - 7x weekly  Stretching Adductors - 3 reps - 3 sets - 1x daily - 7x weekly  Sidelying Thoracic Rotation with Open Book - 5 reps - 1 sets - 10 sec hold - 1x daily - 7x weekly  Seated Scapular Retraction - 10 reps - 1 sets - 5 sec hold - 3x daily - 7x weekly  Seated Cervical Rotation AROM - 10 reps - 1 sets - 1x daily - 7x weekly  Seated Cervical Sidebending Stretch - 3 reps - 1 sets - 30 sec hold - 1x daily - 7x weekly  Seated Thoracic Lumbar Extension with Pectoralis Stretch - 10 reps - 3 sets - 1x daily - 7x weekly  Seated Shoulder External Rotation PROM on Table - 10 reps - 3 sets - 1x daily - 7x weekly  Half Kneeling Hip Flexor Stretch with Tri-Planar Reach - 3 reps - 1 sets - 30 sec hold - 1x daily - 7x weekly  Standing Hamstring Stretch with Step - 3 reps - 1 sets - 30 sec hold - 1x daily - 7x weekly  Standing Gastroc Stretch - 3 reps - 1 sets - 30 sec hold - 1x daily - 7x weekly  Seated Shoulder Horizontal Abduction with Resistance - Palms Down - 10 reps - 1 sets - 1x daily - 7x weekly  Seated Shoulder W External Rotation on Swiss Ball - 10 reps - 1 sets - 1x daily - 7x weekly  Standing Shoulder Row with Anchored Resistance - 10 reps - 1 sets - 1x daily - 7x weekly   Standing Shoulder Extension with Resistance - 10 reps - 1 sets - 1x daily - 7x weekly  Sit to Stand - 10 reps - 1 sets - 3x daily - 7x weekly  [facility] [address] [place], [place] [address] Phone # [phone] Fax [phone]

## 2017-11-25 NOTE — Therapy (Signed)
[facility] [address] [place], [place], [address] Phone: [phone]   Fax:  [phone]  Physical Therapy Treatment  Patient Details  Name: Jennifer Beck MRN: [id number] Date of Birth: 05-09-1966 Referring Provider: Dr. Iran Planas , Magrin[place]   Encounter Date: 11/25/2017  PT End of Session - 11/25/17 1709    Visit Number  8    Number of Visits  20    Date for PT Re-Evaluation  12/24/17    Authorization - Visit Number  8    Authorization - Number of Visits  20    PT Start Time  2122    PT Stop Time  1704    PT Time Calculation (min)  45 min    Activity Tolerance  Patient tolerated treatment well    Behavior During Therapy  [facility] for tasks assessed/performed       Past Medical History:  Diagnosis Date  . Arthritis   . Chest pain   . Complication of anesthesia   . Endometriosis   . Family history of breast cancer 04/22/2017  . Family history of colon cancer 04/22/2017  . Family history of kidney cancer 04/22/2017  . Fatigue   . GERD (gastroesophageal reflux disease)   . GI bleed    caused by ischemic colitis following an episode of hypertension  . Heart palpitations   . Hx of echocardiogram    a. echo 9/13:  EF 55-60%, Gr 2 diast dysfn  . Hypercholesterolemia   . Ischemic colitis ([place])   . 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 Myocarditis ([place])   . Pericarditis   . PONV (postoperative nausea and vomiting)   . SOB (shortness of breath)     Past Surgical History:  Procedure Laterality Date  . BREAST RECONSTRUCTION WITH PLACEMENT OF TISSUE EXPANDER AND ALLODERM Bilateral 10/08/2017   Procedure: BREAST RECONSTRUCTION WITH PLACEMENT OF TISSUE EXPANDER AND ALLODERM;  Surgeon: [name], MD;  Location: [facility];  Service: Plastics;  Laterality: Bilateral;  . CARDIAC CATHETERIZATION  08/26/2001   EF of 60% --- smooth & normal  coronary arteries -- there is a Daily motion defect consistent with posterolateral MI -- she quite possibly had a coronary spasm -- she may have some pericarditis secondary to her MI   . DILATION AND CURETTAGE OF UTERUS  10/15/2003   Uterine enlargement, menorrhagia  . DILATION AND CURETTAGE OF UTERUS  05/27/2002   Dysfunctional uterine bleeding  . LAPAROSCOPY    . MASTECTOMY W/ SENTINEL NODE BIOPSY Bilateral 10/08/2017   Procedure: RIGHT MASTECTOMY WITH RIGHT AXILLARY SENTINEL LYMPH NODE BIOPSY AND LEFT PROPHYLACTIC MASTECTOMY;  Surgeon: [name], MD;  Location: [facility];  Service: General;  Laterality: Bilateral;  . NOVASURE ABLATION  10/15/2003   for management of her extreme menorrhagi  . PORTACATH PLACEMENT Right 04/02/2017   Procedure: INSERTION PORT-A-CATH;  Surgeon: [name], MD;  Location: [facility];  Service: General;  Laterality: Right;  . TUBAL LIGATION  09/2000   bilateral    There were no vitals filed for this visit.  Subjective Assessment - 11/25/17 1622    Subjective  I had my port out last week and was sore so haven't been doing as many exercises.  Denies pain currently    Currently in Pain?  No/denies  [name] Adult PT Treatment/Exercise - 11/25/17 0001      Lumbar Exercises: Seated   Sit to Stand  10 reps cues for posture - not sitting all the way down    Other Seated Lumbar Exercises  Ws on large green ball - 25 lb 20x      Lumbar Exercises: Quadruped   Other Quadruped Lumbar Exercises  thoracic rotation arm straight, arm behind head - 5x each way      Shoulder Exercises: Supine   Other Supine Exercises  thoracic extension on foam roller hands behind head - 10x 5 sec hold      Shoulder Exercises: Standing   Diagonals  Strengthening;Both;20 reps;Theraband    Theraband Level (Shoulder Diagonals)  Level 1 (Yellow) D2 extension    Diagonals Weight (lbs)  15 D1 extension at power tower - 2 sets of 10       Shoulder Exercises: ROM/Strengthening   Other ROM/Strengthening Exercises  UE ranger scaption level 30 bilateral 10x each side      Manual Therapy   Soft tissue mobilization  bilat shoulder - serratus, subscap, lats, pecs, upper traps             PT Education - 11/25/17 1708    Education provided  Yes    Education Details   Access Code: [email or web address]     Person(s) Educated  Patient    Methods  Explanation;Demonstration;Handout;Verbal cues    Comprehension  Verbalized understanding;Returned demonstration       PT Short Term Goals - 11/13/17 1034      PT SHORT TERM GOAL #1   Title  ind with toilet techniques and be able to completely empty with voiding    Time  4    Period  Weeks    Status  Achieved      PT SHORT TERM GOAL #2   Title  ind with initial HEP    Time  4    Period  Weeks    Status  Achieved      PT SHORT TERM GOAL #3   Title  Pt will have right and left shoulder abduction > 125 degrees so that she can more easily wash her hair     Baseline  flexion >150    Time  4    Period  Weeks    Status  On-going        PT Long Term Goals - 11/11/17 0839      PT LONG TERM GOAL #2   Title  Pt will have intercourse and able to void without pain due to knowledge of self care tools for improved tissue health    Baseline  some pain after a while but no pain during most of the time; no issue with urination    Status  Partially Met      PT LONG TERM GOAL #3   Title  Pt will be able to contract pelvic floor and hold for 10 seconds or more due to improved soft tissue health    Time  8    Period  Weeks    Status  On-going      PT LONG TERM GOAL #4   Title  Pt will have 160 degrees of bilateral shoulder abduction so that she can return to her ususal activities without pain or diffuculty     Baseline  124; Lt    Time  8    Period  Weeks    Status  On-going  Plan - 11/25/17 1711    Clinical Impression Statement  Pt did well with exercises and had  noticeably fewer tirgger points in pecs.  Pt continues to have the most difficulty with shoulder external rotaiton and thoracic extension and rotation.  Pt demonstrates continued improvement and will benefit from skilled PT to work on functional goals.    PT Treatment/Interventions  ADLs/Self Care Home Management;Biofeedback;Electrical Stimulation;Moist Heat;Cryotherapy;Therapeutic activities;Therapeutic exercise;Neuromuscular re-education;Patient/family education;Passive range of motion;Scar mobilization;Manual techniques;Dry needling;Taping    PT Next Visit Plan  hip and glute strength, thoracic ROM, shoulder ER ROM, progress postural strength    PT Home Exercise Plan  [email or web address]    Consulted and Agree with Plan of Care  Patient       Patient will benefit from skilled therapeutic intervention in order to improve the following deficits and impairments:  Pain, Postural dysfunction, Increased fascial restricitons, Decreased strength, Decreased coordination, Decreased knowledge of precautions, Decreased knowledge of use of DME, Decreased range of motion  Visit Diagnosis: Stiffness of right shoulder joint  Stiffness of left shoulder joint  Muscle weakness (generalized)  Abnormal posture  Unspecified lack of coordination     Problem List Patient Active Problem List   Diagnosis Date Noted  . Genetic testing 05/02/2017  . Family history of breast cancer 04/22/2017  . Family history of colon cancer 04/22/2017  . Family history of kidney cancer 04/22/2017  . Malignant neoplasm of upper-outer quadrant of right breast in female, estrogen receptor negative ([place]) 03/26/2017  . Chest tightness 03/21/2012    Zannie Cove, PT 11/25/2017, 5:28 PM  [facility] [address] [place], [place], [address] Phone: [phone]   Fax:  [phone]  Name: Jennifer Beck MRN: [id number] Date of Birth: 05-Oct-1965

## 2017-11-27 ENCOUNTER — Encounter: Payer: Self-pay | Admitting: Physical Therapy

## 2017-11-27 ENCOUNTER — Ambulatory Visit: Payer: 59 | Admitting: Physical Therapy

## 2017-11-27 DIAGNOSIS — M25611 Stiffness of right shoulder, not elsewhere classified: Secondary | ICD-10-CM | POA: Diagnosis not present

## 2017-11-27 DIAGNOSIS — R293 Abnormal posture: Secondary | ICD-10-CM

## 2017-11-27 DIAGNOSIS — M6281 Muscle weakness (generalized): Secondary | ICD-10-CM

## 2017-11-27 DIAGNOSIS — R279 Unspecified lack of coordination: Secondary | ICD-10-CM

## 2017-11-27 DIAGNOSIS — M25612 Stiffness of left shoulder, not elsewhere classified: Secondary | ICD-10-CM

## 2017-11-27 NOTE — Patient Instructions (Signed)
Access Code: [email or web address]  URL: [email or web address]  Date: 11/27/2017  Prepared by: Lovett Calender   Exercises  Supine Diaphragmatic Breathing with Pelvic Floor Lengthening - 10 reps - 3 sets - 1x daily - 7x weekly  Supine Chin Tuck - 10 reps - 1 sets - 5 sec hold - 1x daily - 7x weekly  Quadruped Cat Camel - 5 reps - 1 sets - 10 sec hold - 1x daily - 7x weekly  Quadruped Full Range Thoracic Rotation with Reach - 10 reps - 1 sets - 5 sec hold - 1x daily - 7x weekly  Supine Pelvic Floor Stretch - 3 reps - 1 sets - 30 sec hold - 1x daily - 7x weekly  Supine Hamstring Stretch - 3 reps - 1 sets - 30 sec hold - 1x daily - 7x weekly  Double Leg Hamstring Stretch at [place]berger - 3 reps - 1 sets - 30 sec hold - 1x daily - 7x weekly  Stretching Adductors - 3 reps - 3 sets - 1x daily - 7x weekly  Sidelying Thoracic Rotation with Open Book - 5 reps - 1 sets - 10 sec hold - 1x daily - 7x weekly  Seated Scapular Retraction - 10 reps - 1 sets - 5 sec hold - 3x daily - 7x weekly  Seated Cervical Rotation AROM - 10 reps - 1 sets - 1x daily - 7x weekly  Seated Cervical Sidebending Stretch - 3 reps - 1 sets - 30 sec hold - 1x daily - 7x weekly  Seated Thoracic Lumbar Extension with Pectoralis Stretch - 10 reps - 3 sets - 1x daily - 7x weekly  Seated Shoulder External Rotation PROM on Table - 10 reps - 3 sets - 1x daily - 7x weekly  Half Kneeling Hip Flexor Stretch with Tri-Planar Reach - 3 reps - 1 sets - 30 sec hold - 1x daily - 7x weekly  Standing Hamstring Stretch with Step - 3 reps - 1 sets - 30 sec hold - 1x daily - 7x weekly  Standing Gastroc Stretch - 3 reps - 1 sets - 30 sec hold - 1x daily - 7x weekly  Seated Shoulder Horizontal Abduction with Resistance - Palms Down - 10 reps - 1 sets - 1x daily - 7x weekly  Seated Shoulder W External Rotation on Swiss Ball - 10 reps - 1 sets - 1x daily - 7x weekly  Standing Shoulder Row with Anchored Resistance - 10 reps - 1 sets - 1x daily - 7x weekly   Standing Shoulder Extension with Resistance - 10 reps - 1 sets - 1x daily - 7x weekly  Sit to Stand - 10 reps - 1 sets - 3x daily - 7x weekly  Quadruped Hip Extension Kicks - 10 reps - 3 sets - 1x daily - 7x weekly  Quadruped Ambulance person - 10 reps - 3 sets - 1x daily - 7x weekly  Quadruped Circle Weight Shifts - 10 reps - 3 sets - 1x daily - 7x weekly  Side Stepping with Resistance at Feet - 10 reps - 3 sets - 1x daily - 7x weekly  Bridge - 10 reps - 3 sets - 1x daily - 7x weekly  [facility] [address], [place], [place] [address] Phone # [phone] Fax [phone]

## 2017-11-27 NOTE — Therapy (Signed)
[facility] [address], [place], [place], [address] Phone: [phone]   Fax:  [phone]  Physical Therapy Treatment  Patient Details  Name: Jennifer Beck MRN: [id number] Date of Birth: 1965/10/13 Referring Provider: Dr. Iran Planas , [place]rin[place]   Encounter Date: 11/27/2017  PT End of Session - 11/27/17 1819    Visit Number  9    Number of Visits  20    Date for PT Re-Evaluation  12/24/17    Authorization - Visit Number  9    Authorization - Number of Visits  20    PT Start Time  5329    PT Stop Time  9242    PT Time Calculation (min)  43 min    Activity Tolerance  Patient tolerated treatment well    Behavior During Therapy  [facility] for tasks assessed/performed       Past Medical History:  Diagnosis Date  . Arthritis   . Chest pain   . Complication of anesthesia   . Endometriosis   . Family history of breast cancer 04/22/2017  . Family history of colon cancer 04/22/2017  . Family history of kidney cancer 04/22/2017  . Fatigue   . GERD (gastroesophageal reflux disease)   . GI bleed    caused by ischemic colitis following an episode of hypertension  . Heart palpitations   . Hx of echocardiogram    a. echo 9/13:  EF 55-60%, Gr 2 diast dysfn  . Hypercholesterolemia   . Ischemic colitis ([place])   . 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 Myocarditis ([place])   . Pericarditis   . PONV (postoperative nausea and vomiting)   . SOB (shortness of breath)     Past Surgical History:  Procedure Laterality Date  . BREAST RECONSTRUCTION WITH PLACEMENT OF TISSUE EXPANDER AND ALLODERM Bilateral 10/08/2017   Procedure: BREAST RECONSTRUCTION WITH PLACEMENT OF TISSUE EXPANDER AND ALLODERM;  Surgeon: [name], MD;  Location: [facility];  Service: Plastics;  Laterality: Bilateral;  . CARDIAC CATHETERIZATION  08/26/2001   EF of 60% --- smooth & normal  coronary arteries -- there is a Panik motion defect consistent with posterolateral MI -- she quite possibly had a coronary spasm -- she may have some pericarditis secondary to her MI   . DILATION AND CURETTAGE OF UTERUS  10/15/2003   Uterine enlargement, menorrhagia  . DILATION AND CURETTAGE OF UTERUS  05/27/2002   Dysfunctional uterine bleeding  . LAPAROSCOPY    . MASTECTOMY W/ SENTINEL NODE BIOPSY Bilateral 10/08/2017   Procedure: RIGHT MASTECTOMY WITH RIGHT AXILLARY SENTINEL LYMPH NODE BIOPSY AND LEFT PROPHYLACTIC MASTECTOMY;  Surgeon: [name], MD;  Location: [facility];  Service: General;  Laterality: Bilateral;  . NOVASURE ABLATION  10/15/2003   for management of her extreme menorrhagi  . PORTACATH PLACEMENT Right 04/02/2017   Procedure: INSERTION PORT-A-CATH;  Surgeon: [name], MD;  [facility];  Service: General;  Laterality: Right;  . TUBAL LIGATION  09/2000   bilateral    There were no vitals filed for this visit.  Subjective Assessment - 11/27/17 1535    Subjective  I have some soreness in my legs at the end of the day.  I feel stiff into the ribcage on my left side when I lift my arm.    Patient Stated Goals  wants to learn what to do with her arms since she had  the mastectomy     Currently in Pain?  No/denies                       [facility] Adult PT Treatment/Exercise - 11/27/17 0001      Self-Care   Other Self-Care Comments   educated in skin rolling for fascial release      Lumbar Exercises: Standing   Other Standing Lumbar Exercises  side stepping with red band - 20x each side      Lumbar Exercises: Sidelying   Hip Abduction  20 reps    Other Sidelying Lumbar Exercises  hip adduction 20x      Lumbar Exercises: Quadruped   Other Quadruped Lumbar Exercises  fire hydrants and LE ext with knee bent; UE up and back and circles - 10 xeach      Shoulder Exercises: ROM/Strengthening   UBE (Upper Arm Bike)  L2 2 x 2 fwd/back PT  present for status update      Manual Therapy   Soft tissue mobilization  left fascial release to ribcage and incisions             PT Education - 11/27/17 1819    Education provided  Yes    Education Details   Access Code: [email or web address]     Person(s) Educated  Patient    Methods  Explanation;Demonstration;Handout;Verbal cues    Comprehension  Verbalized understanding;Returned demonstration       PT Short Term Goals - 11/13/17 1034      PT SHORT TERM GOAL #1   Title  ind with toilet techniques and be able to completely empty with voiding    Time  4    Period  Weeks    Status  Achieved      PT SHORT TERM GOAL #2   Title  ind with initial HEP    Time  4    Period  Weeks    Status  Achieved      PT SHORT TERM GOAL #3   Title  Pt will have right and left shoulder abduction > 125 degrees so that she can more easily wash her hair     Baseline  flexion >150    Time  4    Period  Weeks    Status  On-going        PT Long Term Goals - 11/11/17 0839      PT LONG TERM GOAL #2   Title  Pt will have intercourse and able to void without pain due to knowledge of self care tools for improved tissue health    Baseline  some pain after a while but no pain during most of the time; no issue with urination    Status  Partially Met      PT LONG TERM GOAL #3   Title  Pt will be able to contract pelvic floor and hold for 10 seconds or more due to improved soft tissue health    Time  8    Period  Weeks    Status  On-going      PT LONG TERM GOAL #4   Title  Pt will have 160 degrees of bilateral shoulder abduction so that she can return to her ususal activities without pain or diffuculty     Baseline  124; Lt    Time  8    Period  Weeks    Status  On-going  Plan - 11/27/17 1822    Clinical Impression Statement  Pt did well with exercises and able to demonstrate progress with HEP.  She responded well to [facility] to left side with increased mobility.  Pt will continue with  POC to address posture and ROM for full return to functional activities.    PT Treatment/Interventions  ADLs/Self Care Home Management;Biofeedback;Electrical Stimulation;Moist Heat;Cryotherapy;Therapeutic activities;Therapeutic exercise;Neuromuscular re-education;Patient/family education;Passive range of motion;Scar mobilization;Manual techniques;Dry needling;Taping    PT Next Visit Plan  f/u with additions to HEP, hip and glute strength, thoracic ROM, shoulder ER ROM, progress postural strength    PT Home Exercise Plan  [email or web address]    Consulted and Agree with Plan of Care  Patient       Patient will benefit from skilled therapeutic intervention in order to improve the following deficits and impairments:  Pain, Postural dysfunction, Increased fascial restricitons, Decreased strength, Decreased coordination, Decreased knowledge of precautions, Decreased knowledge of use of DME, Decreased range of motion  Visit Diagnosis: Stiffness of right shoulder joint  Stiffness of left shoulder joint  Muscle weakness (generalized)  Abnormal posture  Unspecified lack of coordination     Problem List Patient Active Problem List   Diagnosis Date Noted  . Genetic testing 05/02/2017  . Family history of breast cancer 04/22/2017  . Family history of colon cancer 04/22/2017  . Family history of kidney cancer 04/22/2017  . Malignant neoplasm of upper-outer quadrant of right breast in female, estrogen receptor negative ([place]) 03/26/2017  . Chest tightness 03/21/2012    Zannie Cove, PT 11/27/2017, 6:28 PM  [facility] [address], [place], [place], [address] Phone: [phone]   Fax:  [phone]  Name: Jennifer Beck MRN: [id number] Date of Birth: 1966-01-16

## 2017-12-02 ENCOUNTER — Other Ambulatory Visit: Payer: Self-pay | Admitting: Oncology

## 2017-12-11 ENCOUNTER — Ambulatory Visit: Payer: 59 | Admitting: Physical Therapy

## 2017-12-11 DIAGNOSIS — M25611 Stiffness of right shoulder, not elsewhere classified: Secondary | ICD-10-CM | POA: Diagnosis not present

## 2017-12-11 DIAGNOSIS — M25612 Stiffness of left shoulder, not elsewhere classified: Secondary | ICD-10-CM

## 2017-12-11 DIAGNOSIS — M6281 Muscle weakness (generalized): Secondary | ICD-10-CM

## 2017-12-11 DIAGNOSIS — R279 Unspecified lack of coordination: Secondary | ICD-10-CM

## 2017-12-11 DIAGNOSIS — R293 Abnormal posture: Secondary | ICD-10-CM

## 2017-12-11 NOTE — Therapy (Addendum)
[facility] [address] [place], [place], [address] Phone: [phone]   Fax:  [phone]  Physical Therapy Treatment  Patient Details  Name: Jennifer Beck MRN: [id number] Date of Birth: [birth year] Referring Provider: Dr. Iran Planas , Mag[place]   Encounter Date: 12/11/2017  PT End of Session - 12/11/17 0811    Visit Number  10    Number of Visits  20    Date for PT Re-Evaluation  12/24/17    Authorization - Visit Number  10    Authorization - Number of Visits  20    PT Start Time  0803    PT Stop Time  0842    PT Time Calculation (min)  39 min    Activity Tolerance  Patient tolerated treatment well    Behavior During Therapy  [facility] for tasks assessed/performed       Past Medical History:  Diagnosis Date  . Arthritis   . Chest pain   . Complication of anesthesia   . Endometriosis   . Family history of breast cancer 04/22/2017  . Family history of colon cancer 04/22/2017  . Family history of kidney cancer 04/22/2017  . Fatigue   . GERD (gastroesophageal reflux disease)   . GI bleed    caused by ischemic colitis following an episode of hypertension  . Heart palpitations   . Hx of echocardiogram    a. echo 9/13:  EF 55-60%, Gr 2 diast dysfn  . Hypercholesterolemia   . Ischemic colitis ([place])   . 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 Myocarditis ([place])   . Pericarditis   . PONV (postoperative nausea and vomiting)   . SOB (shortness of breath)     Past Surgical History:  Procedure Laterality Date  . BREAST RECONSTRUCTION WITH PLACEMENT OF TISSUE EXPANDER AND ALLODERM Bilateral 10/08/2017   Procedure: BREAST RECONSTRUCTION WITH PLACEMENT OF TISSUE EXPANDER AND ALLODERM;  Surgeon: [name], MD;  Location: [place];  Service: Plastics;  Laterality: Bilateral;  . CARDIAC CATHETERIZATION  08/26/2001   EF of 60% --- smooth &  normal coronary arteries -- there is a Keleher motion defect consistent with posterolateral MI -- she quite possibly had a coronary spasm -- she may have some pericarditis secondary to her MI   . DILATION AND CURETTAGE OF UTERUS  10/15/2003   Uterine enlargement, menorrhagia  . DILATION AND CURETTAGE OF UTERUS  05/27/2002   Dysfunctional uterine bleeding  . LAPAROSCOPY    . MASTECTOMY W/ SENTINEL NODE BIOPSY Bilateral 10/08/2017   Procedure: RIGHT MASTECTOMY WITH RIGHT AXILLARY SENTINEL LYMPH NODE BIOPSY AND LEFT PROPHYLACTIC MASTECTOMY;  Surgeon: [name], MD;  Location: [place];  Service: General;  Laterality: Bilateral;  . NOVASURE ABLATION  10/15/2003   for management of her extreme menorrhagi  . PORTACATH PLACEMENT Right 04/02/2017   Procedure: INSERTION PORT-A-CATH;  Surgeon: [name], MD;  Location: [facility];  Service: General;  Laterality: Right;  . TUBAL LIGATION  09/2000   bilateral    There were no vitals filed for this visit.  Subjective Assessment - 12/11/17 0809    Subjective  I have been having a stressful week at work and find I am leaning forward a lot.      Patient Stated Goals  wants to learn what to do with her arms since she had the mastectomy     Currently in Pain?  No/denies                 AROM: Right shoulder flexion and abduction 170 degrees Left shoulder flexion 170 deg; abduction 155 deg    [facility] Adult PT Treatment/Exercise - 12/11/17 0001      Lumbar Exercises: Standing   Mallet Slides  20 reps with lift off and scapular depression    Other Standing Lumbar Exercises  [place] push ups      Shoulder Exercises: Standing   Flexion  Strengthening;AROM;Left      Shoulder Exercises: ROM/Strengthening   UBE (Upper Arm Bike)  L2  3x3 fwd/back PT present for status update      Manual Therapy   Soft tissue mobilization  left fascial release to ribcage and incisions, subscapularis, lats, pec               PT Short Term  Goals - 12/11/17 0926      PT SHORT TERM GOAL #3   Title  Pt will have right and left shoulder abduction > 125 degrees so that she can more easily wash her hair     Status  Achieved        PT Long Term Goals - 12/11/17 0926      PT LONG TERM GOAL #1   Title  ind with advanced HEP    Status  Achieved      PT LONG TERM GOAL #2   Title  Pt will have intercourse and able to void without pain due to knowledge of self care tools for improved tissue health    Baseline  some pain after a while with intercourse but no pain during most of the time; no issue with urination    Status  Partially Met      PT LONG TERM GOAL #3   Title  Pt will be able to contract pelvic floor and hold for 10 seconds or more due to improved soft tissue health    Status  Achieved      PT LONG TERM GOAL #4   Title  Pt will have 160 degrees of bilateral shoulder abduction so that she can return to her ususal activities without pain or diffuculty     Baseline  Lt abduction 155; all other 160 deg or more    Status  Partially Met            Plan - 12/11/17 0928    Clinical Impression Statement  Pt is doing well and continues to improve ROM.  She was given recommendation for standing desk.  She is currently able to manage on her own with her HEP and recommended to discharge with HEP.    PT Treatment/Interventions  ADLs/Self Care Home Management;Biofeedback;Electrical Stimulation;Moist Heat;Cryotherapy;Therapeutic activities;Therapeutic exercise;Neuromuscular re-education;Patient/family education;Passive range of motion;Scar mobilization;Manual techniques;Dry needling;Taping    PT Next Visit Plan  discharged today    PT [place] and Agree with Plan of Care  Patient       Patient will benefit from skilled therapeutic intervention in order to improve the following deficits and impairments:  Postural dysfunction, Increased fascial restricitons, Decreased strength, Decreased  coordination, Decreased range of motion  Visit Diagnosis: Stiffness of right shoulder joint  Stiffness of left shoulder joint  Muscle weakness (generalized)  Abnormal posture  Unspecified lack of coordination     Problem List Patient Active Problem List   Diagnosis Date Noted  . Genetic testing 05/02/2017  . Family history of  breast cancer 04/22/2017  . Family history of colon cancer 04/22/2017  . Family history of kidney cancer 04/22/2017  . Malignant neoplasm of upper-outer quadrant of right breast in female, estrogen receptor negative ([place]) 03/26/2017  . Chest tightness 03/21/2012    Zannie Cove, PT 12/11/2017, 10:36 AM  [facility] [address], [place], [place], [address] Phone: [phone]   Fax:  [phone]  Name: Jennifer Beck MRN: [id number] Date of Birth: 08/18/1965  PHYSICAL THERAPY DISCHARGE SUMMARY  Visits from Start of Care: 10  Current functional level related to goals / functional outcomes: See above goals   Remaining deficits: See above   Education / Equipment: HEP Plan: Patient agrees to discharge.  Patient goals were partially met. Patient is being discharged due to being pleased with the current functional level.  ?????    [employer], PT 12/11/17 10:56 AM

## 2017-12-23 DIAGNOSIS — N951 Menopausal and female climacteric states: Secondary | ICD-10-CM | POA: Diagnosis not present

## 2017-12-23 DIAGNOSIS — Z9013 Acquired absence of bilateral breasts and nipples: Secondary | ICD-10-CM | POA: Diagnosis not present

## 2017-12-23 DIAGNOSIS — N952 Postmenopausal atrophic vaginitis: Secondary | ICD-10-CM | POA: Diagnosis not present

## 2018-01-09 ENCOUNTER — Other Ambulatory Visit: Payer: Self-pay

## 2018-01-09 ENCOUNTER — Encounter (HOSPITAL_BASED_OUTPATIENT_CLINIC_OR_DEPARTMENT_OTHER): Payer: Self-pay | Admitting: *Deleted

## 2018-01-14 ENCOUNTER — Other Ambulatory Visit: Payer: Self-pay | Admitting: Oncology

## 2018-01-14 NOTE — Progress Notes (Signed)
Patient given Ensure pre-op drink and instructions reviewed.

## 2018-01-17 ENCOUNTER — Encounter (HOSPITAL_BASED_OUTPATIENT_CLINIC_OR_DEPARTMENT_OTHER)
Admission: RE | Disposition: A | Discharge status: Home / Self Care | Payer: Self-pay | Source: Ambulatory Visit | Attending: Plastic Surgery

## 2018-01-17 ENCOUNTER — Ambulatory Visit (HOSPITAL_BASED_OUTPATIENT_CLINIC_OR_DEPARTMENT_OTHER): Payer: 59 | Admitting: Anesthesiology

## 2018-01-17 ENCOUNTER — Ambulatory Visit (HOSPITAL_BASED_OUTPATIENT_CLINIC_OR_DEPARTMENT_OTHER)
Admission: RE | Admit: 2018-01-17 | Discharge: 2018-01-17 | Disposition: A | Discharge status: Home / Self Care | Payer: 59 | Source: Ambulatory Visit | Attending: Plastic Surgery | Admitting: Plastic Surgery

## 2018-01-17 ENCOUNTER — Other Ambulatory Visit: Payer: Self-pay

## 2018-01-17 ENCOUNTER — Encounter (HOSPITAL_BASED_OUTPATIENT_CLINIC_OR_DEPARTMENT_OTHER): Payer: Self-pay | Admitting: *Deleted

## 2018-01-17 DIAGNOSIS — Z79899 Other long term (current) drug therapy: Secondary | ICD-10-CM | POA: Insufficient documentation

## 2018-01-17 DIAGNOSIS — Z9010 Acquired absence of unspecified breast and nipple: Secondary | ICD-10-CM | POA: Diagnosis not present

## 2018-01-17 DIAGNOSIS — M1990 Unspecified osteoarthritis, unspecified site: Secondary | ICD-10-CM | POA: Insufficient documentation

## 2018-01-17 DIAGNOSIS — K219 Gastro-esophageal reflux disease without esophagitis: Secondary | ICD-10-CM | POA: Insufficient documentation

## 2018-01-17 DIAGNOSIS — Z421 Encounter for breast reconstruction following mastectomy: Secondary | ICD-10-CM | POA: Insufficient documentation

## 2018-01-17 DIAGNOSIS — Z853 Personal history of malignant neoplasm of breast: Secondary | ICD-10-CM | POA: Insufficient documentation

## 2018-01-17 DIAGNOSIS — Z9013 Acquired absence of bilateral breasts and nipples: Secondary | ICD-10-CM | POA: Diagnosis not present

## 2018-01-17 DIAGNOSIS — Z9221 Personal history of antineoplastic chemotherapy: Secondary | ICD-10-CM | POA: Insufficient documentation

## 2018-01-17 HISTORY — DX: Disease of pericardium, unspecified: I31.9

## 2018-01-17 HISTORY — PX: REMOVAL OF BILATERAL TISSUE EXPANDERS WITH PLACEMENT OF BILATERAL BREAST IMPLANTS: SHX6431

## 2018-01-17 HISTORY — PX: LIPOSUCTION WITH LIPOFILLING: SHX6436

## 2018-01-17 SURGERY — REMOVAL, TISSUE EXPANDER, BREAST, BILATERAL, WITH BILATERAL IMPLANT IMPLANT INSERTION
Anesthesia: General | Laterality: Bilateral

## 2018-01-17 MED ORDER — BUPIVACAINE-EPINEPHRINE (PF) 0.25% -1:200000 IJ SOLN
INTRAMUSCULAR | Status: AC
  Filled 2018-01-17: qty 30

## 2018-01-17 MED ORDER — ROCURONIUM BROMIDE 100 MG/10ML IV SOLN
INTRAVENOUS | Status: DC | PRN
  Administered 2018-01-17: 10 mg via INTRAVENOUS
  Administered 2018-01-17: 50 mg via INTRAVENOUS

## 2018-01-17 MED ORDER — ROCURONIUM BROMIDE 50 MG/5ML IV SOSY
PREFILLED_SYRINGE | INTRAVENOUS | Status: AC
  Filled 2018-01-17: qty 5

## 2018-01-17 MED ORDER — FENTANYL CITRATE (PF) 100 MCG/2ML IJ SOLN
INTRAMUSCULAR | Status: AC
  Filled 2018-01-17: qty 2

## 2018-01-17 MED ORDER — MIDAZOLAM HCL 2 MG/2ML IJ SOLN
INTRAMUSCULAR | Status: AC
  Filled 2018-01-17: qty 2

## 2018-01-17 MED ORDER — FENTANYL CITRATE (PF) 100 MCG/2ML IJ SOLN
50.0000 ug | INTRAMUSCULAR | Status: AC | PRN
  Administered 2018-01-17: 50 ug via INTRAVENOUS
  Administered 2018-01-17: 100 ug via INTRAVENOUS
  Administered 2018-01-17: 50 ug via INTRAVENOUS

## 2018-01-17 MED ORDER — HYDROMORPHONE HCL 1 MG/ML IJ SOLN
0.2500 mg | INTRAMUSCULAR | Status: DC | PRN
  Administered 2018-01-17: 0.25 mg via INTRAVENOUS
  Administered 2018-01-17: 0.5 mg via INTRAVENOUS
  Administered 2018-01-17: 0.25 mg via INTRAVENOUS

## 2018-01-17 MED ORDER — PHENYLEPHRINE 40 MCG/ML (10ML) SYRINGE FOR IV PUSH (FOR BLOOD PRESSURE SUPPORT)
PREFILLED_SYRINGE | INTRAVENOUS | Status: AC
  Filled 2018-01-17: qty 10

## 2018-01-17 MED ORDER — SUCCINYLCHOLINE CHLORIDE 200 MG/10ML IV SOSY
PREFILLED_SYRINGE | INTRAVENOUS | Status: AC
  Filled 2018-01-17: qty 10

## 2018-01-17 MED ORDER — SODIUM BICARBONATE 4 % IV SOLN
INTRAVENOUS | Status: DC | PRN
  Administered 2018-01-17: 350 mL via INTRAMUSCULAR

## 2018-01-17 MED ORDER — GABAPENTIN 300 MG PO CAPS
300.0000 mg | ORAL_CAPSULE | ORAL | Status: AC
  Administered 2018-01-17: 300 mg via ORAL

## 2018-01-17 MED ORDER — MIDAZOLAM HCL 2 MG/2ML IJ SOLN
1.0000 mg | INTRAMUSCULAR | Status: DC | PRN
  Administered 2018-01-17: 2 mg via INTRAVENOUS

## 2018-01-17 MED ORDER — CHLORHEXIDINE GLUCONATE CLOTH 2 % EX PADS: 6.0000 | MEDICATED_PAD | Freq: Once | CUTANEOUS | Status: DC

## 2018-01-17 MED ORDER — GABAPENTIN 300 MG PO CAPS
ORAL_CAPSULE | ORAL | Status: AC
  Filled 2018-01-17: qty 1

## 2018-01-17 MED ORDER — PHENYLEPHRINE HCL 10 MG/ML IJ SOLN
INTRAMUSCULAR | Status: DC | PRN
  Administered 2018-01-17 (×2): 80 ug via INTRAVENOUS

## 2018-01-17 MED ORDER — DEXAMETHASONE SODIUM PHOSPHATE 4 MG/ML IJ SOLN
INTRAMUSCULAR | Status: DC | PRN
  Administered 2018-01-17: 10 mg via INTRAVENOUS

## 2018-01-17 MED ORDER — HYDROMORPHONE HCL 1 MG/ML IJ SOLN
INTRAMUSCULAR | Status: AC
  Filled 2018-01-17: qty 0.5

## 2018-01-17 MED ORDER — SULFAMETHOXAZOLE-TRIMETHOPRIM 800-160 MG PO TABS: 1.0000 | ORAL_TABLET | Freq: Two times a day (BID) | ORAL | 0 refills | Status: DC

## 2018-01-17 MED ORDER — SUGAMMADEX SODIUM 200 MG/2ML IV SOLN
INTRAVENOUS | Status: DC | PRN
  Administered 2018-01-17: 150 mg via INTRAVENOUS

## 2018-01-17 MED ORDER — SODIUM CHLORIDE 0.9 % IV SOLN
INTRAVENOUS | Status: DC | PRN
  Administered 2018-01-17: 1000 mL

## 2018-01-17 MED ORDER — ACETAMINOPHEN 500 MG PO TABS
ORAL_TABLET | ORAL | Status: AC
Start: 2018-01-17 — End: ?
  Filled 2018-01-17: qty 2

## 2018-01-17 MED ORDER — CELECOXIB 200 MG PO CAPS
ORAL_CAPSULE | ORAL | Status: AC
  Filled 2018-01-17: qty 1

## 2018-01-17 MED ORDER — LACTATED RINGERS IV SOLN
INTRAVENOUS | Status: DC
  Administered 2018-01-17 (×3): via INTRAVENOUS

## 2018-01-17 MED ORDER — ACETAMINOPHEN 500 MG PO TABS
1000.0000 mg | ORAL_TABLET | ORAL | Status: AC
  Administered 2018-01-17: 1000 mg via ORAL

## 2018-01-17 MED ORDER — EPHEDRINE 5 MG/ML INJ
INTRAVENOUS | Status: AC
Start: 2018-01-17 — End: ?
  Filled 2018-01-17: qty 10

## 2018-01-17 MED ORDER — LIDOCAINE 2% (20 MG/ML) 5 ML SYRINGE
INTRAMUSCULAR | Status: AC
  Filled 2018-01-17: qty 5

## 2018-01-17 MED ORDER — LIDOCAINE HCL 2 % IJ SOLN
INTRAMUSCULAR | Status: AC
  Filled 2018-01-17: qty 40

## 2018-01-17 MED ORDER — SCOPOLAMINE 1 MG/3DAYS TD PT72
MEDICATED_PATCH | TRANSDERMAL | Status: AC
  Filled 2018-01-17: qty 1

## 2018-01-17 MED ORDER — SCOPOLAMINE 1 MG/3DAYS TD PT72
1.0000 | MEDICATED_PATCH | Freq: Once | TRANSDERMAL | Status: DC | PRN
  Administered 2018-01-17: 1.5 mg via TRANSDERMAL

## 2018-01-17 MED ORDER — CEFAZOLIN SODIUM-DEXTROSE 2-4 GM/100ML-% IV SOLN
INTRAVENOUS | Status: AC
  Filled 2018-01-17: qty 100

## 2018-01-17 MED ORDER — EPINEPHRINE 30 MG/30ML IJ SOLN
INTRAMUSCULAR | Status: AC
  Filled 2018-01-17: qty 1

## 2018-01-17 MED ORDER — SODIUM BICARBONATE 4 % IV SOLN
INTRAVENOUS | Status: AC
  Filled 2018-01-17: qty 10

## 2018-01-17 MED ORDER — ONDANSETRON HCL 4 MG/2ML IJ SOLN
INTRAMUSCULAR | Status: AC
  Filled 2018-01-17: qty 2

## 2018-01-17 MED ORDER — DEXAMETHASONE SODIUM PHOSPHATE 10 MG/ML IJ SOLN
INTRAMUSCULAR | Status: AC
  Filled 2018-01-17: qty 1

## 2018-01-17 MED ORDER — PROPOFOL 10 MG/ML IV BOLUS
INTRAVENOUS | Status: DC | PRN
  Administered 2018-01-17: 150 mg via INTRAVENOUS

## 2018-01-17 MED ORDER — CELECOXIB 200 MG PO CAPS
200.0000 mg | ORAL_CAPSULE | ORAL | Status: AC
  Administered 2018-01-17: 200 mg via ORAL

## 2018-01-17 MED ORDER — CEFAZOLIN SODIUM-DEXTROSE 2-4 GM/100ML-% IV SOLN
2.0000 g | INTRAVENOUS | Status: AC
  Administered 2018-01-17: 2 g via INTRAVENOUS

## 2018-01-17 MED ORDER — LIDOCAINE HCL (PF) 1 % IJ SOLN
INTRAMUSCULAR | Status: AC
  Filled 2018-01-17: qty 60

## 2018-01-17 SURGICAL SUPPLY — 83 items
ADH SKN CLS APL DERMABOND .7 (GAUZE/BANDAGES/DRESSINGS) ×2
BAG DECANTER FOR FLEXI CONT (MISCELLANEOUS) ×3 IMPLANT
BINDER ABDOMINAL 10 UNV 27-48 (MISCELLANEOUS) ×2 IMPLANT
BINDER ABDOMINAL 12 SM 30-45 (SOFTGOODS) IMPLANT
BINDER BREAST 3XL (GAUZE/BANDAGES/DRESSINGS) IMPLANT
BINDER BREAST LRG (GAUZE/BANDAGES/DRESSINGS) ×2 IMPLANT
BINDER BREAST MEDIUM (GAUZE/BANDAGES/DRESSINGS) IMPLANT
BINDER BREAST XLRG (GAUZE/BANDAGES/DRESSINGS) IMPLANT
BINDER BREAST XXLRG (GAUZE/BANDAGES/DRESSINGS) IMPLANT
BLADE SURG 10 STRL SS (BLADE) ×6 IMPLANT
BLADE SURG 11 STRL SS (BLADE) ×3 IMPLANT
BNDG GAUZE ELAST 4 BULKY (GAUZE/BANDAGES/DRESSINGS) ×6 IMPLANT
CANISTER LIPO FAT HARVEST (MISCELLANEOUS) ×3 IMPLANT
CANISTER SUCT 1200ML W/VALVE (MISCELLANEOUS) ×7 IMPLANT
CHLORAPREP W/TINT 26ML (MISCELLANEOUS) ×5 IMPLANT
COVER BACK TABLE 60X90IN (DRAPES) ×3 IMPLANT
COVER MAYO STAND STRL (DRAPES) ×5 IMPLANT
DECANTER SPIKE VIAL GLASS SM (MISCELLANEOUS) IMPLANT
DERMABOND ADVANCED (GAUZE/BANDAGES/DRESSINGS) ×4
DERMABOND ADVANCED .7 DNX12 (GAUZE/BANDAGES/DRESSINGS) ×2 IMPLANT
DRAIN CHANNEL 15F RND FF W/TCR (WOUND CARE) IMPLANT
DRAPE TOP ARMCOVERS (MISCELLANEOUS) ×3 IMPLANT
DRAPE U 60X70 (DRAPES) ×2 IMPLANT
DRAPE U-SHAPE 76X120 STRL (DRAPES) ×3 IMPLANT
DRSG PAD ABDOMINAL 8X10 ST (GAUZE/BANDAGES/DRESSINGS) ×6 IMPLANT
ELECT BLADE 4.0 EZ CLEAN MEGAD (MISCELLANEOUS) ×3
ELECT COATED BLADE 2.86 ST (ELECTRODE) ×3 IMPLANT
ELECT REM PT RETURN 9FT ADLT (ELECTROSURGICAL) ×3
ELECTRODE BLDE 4.0 EZ CLN MEGD (MISCELLANEOUS) ×1 IMPLANT
ELECTRODE REM PT RTRN 9FT ADLT (ELECTROSURGICAL) ×1 IMPLANT
EVACUATOR SILICONE 100CC (DRAIN) IMPLANT
GLOVE BIO SURGEON STRL SZ 6 (GLOVE) ×6 IMPLANT
GLOVE BIO SURGEON STRL SZ 6.5 (GLOVE) IMPLANT
GLOVE BIO SURGEONS STRL SZ 6.5 (GLOVE)
GLOVE BIOGEL PI IND STRL 7.0 (GLOVE) IMPLANT
GLOVE BIOGEL PI INDICATOR 7.0 (GLOVE) ×4
GOWN STRL REUS W/ TWL LRG LVL3 (GOWN DISPOSABLE) ×2 IMPLANT
GOWN STRL REUS W/ TWL XL LVL3 (GOWN DISPOSABLE) IMPLANT
GOWN STRL REUS W/TWL LRG LVL3 (GOWN DISPOSABLE) ×6
GOWN STRL REUS W/TWL XL LVL3 (GOWN DISPOSABLE) ×3
IMPL BREAST INSPI SRX 545CC (Breast) IMPLANT
IMPLANT BREAST INSPI SRX 545CC (Breast) ×6 IMPLANT
IV NS 500ML (IV SOLUTION)
IV NS 500ML BAXH (IV SOLUTION) IMPLANT
KIT FILL SYSTEM UNIVERSAL (SET/KITS/TRAYS/PACK) IMPLANT
LINER CANISTER 1000CC FLEX (MISCELLANEOUS) ×3 IMPLANT
MARKER SKIN DUAL TIP RULER LAB (MISCELLANEOUS) IMPLANT
NDL HYPO 25X1 1.5 SAFETY (NEEDLE) IMPLANT
NDL SAFETY ECLIPSE 18X1.5 (NEEDLE) ×1 IMPLANT
NEEDLE HYPO 18GX1.5 SHARP (NEEDLE) ×3
NEEDLE HYPO 25X1 1.5 SAFETY (NEEDLE) IMPLANT
NS IRRIG 1000ML POUR BTL (IV SOLUTION) ×4 IMPLANT
PACK BASIN DAY SURGERY FS (CUSTOM PROCEDURE TRAY) ×3 IMPLANT
PAD ALCOHOL SWAB (MISCELLANEOUS) ×3 IMPLANT
PENCIL BUTTON HOLSTER BLD 10FT (ELECTRODE) ×3 IMPLANT
PIN SAFETY STERILE (MISCELLANEOUS) IMPLANT
PUNCH BIOPSY DERMAL 4MM (MISCELLANEOUS) IMPLANT
SHEET MEDIUM DRAPE 40X70 STRL (DRAPES) ×6 IMPLANT
SIZER BREAST REUSE GEL 545CC (SIZER) ×3
SIZER BRST REUSE GEL 545CC (SIZER) IMPLANT
SLEEVE SCD COMPRESS KNEE MED (MISCELLANEOUS) ×3 IMPLANT
SPONGE LAP 18X18 RF (DISPOSABLE) ×4 IMPLANT
STAPLER VISISTAT 35W (STAPLE) ×3 IMPLANT
SUT ETHILON 2 0 FS 18 (SUTURE) IMPLANT
SUT MNCRL AB 4-0 PS2 18 (SUTURE) ×6 IMPLANT
SUT PDS AB 2-0 CT2 27 (SUTURE) IMPLANT
SUT VIC AB 3-0 PS1 18 (SUTURE)
SUT VIC AB 3-0 PS1 18XBRD (SUTURE) IMPLANT
SUT VIC AB 3-0 SH 27 (SUTURE) ×6
SUT VIC AB 3-0 SH 27X BRD (SUTURE) ×2 IMPLANT
SUT VICRYL 4-0 PS2 18IN ABS (SUTURE) ×6 IMPLANT
SYR 10ML LL (SYRINGE) ×11 IMPLANT
SYR 50ML LL SCALE MARK (SYRINGE) ×10 IMPLANT
SYR BULB IRRIGATION 50ML (SYRINGE) ×6 IMPLANT
SYR CONTROL 10ML LL (SYRINGE) ×2 IMPLANT
SYR TB 1ML LL NO SAFETY (SYRINGE) IMPLANT
TOWEL GREEN STERILE FF (TOWEL DISPOSABLE) ×6 IMPLANT
TUBE CONNECTING 20'X1/4 (TUBING) ×2
TUBE CONNECTING 20X1/4 (TUBING) ×3 IMPLANT
TUBING INFILTRATION IT-10001 (TUBING) ×3 IMPLANT
TUBING SET GRADUATE ASPIR 12FT (MISCELLANEOUS) ×3 IMPLANT
UNDERPAD 30X30 (UNDERPADS AND DIAPERS) ×6 IMPLANT
YANKAUER SUCT BULB TIP NO VENT (SUCTIONS) ×3 IMPLANT

## 2018-01-17 NOTE — Anesthesia Postprocedure Evaluation (Signed)
Anesthesia Post Note  Patient: Jennifer Beck  Procedure(s) Performed: REMOVAL OF BILATERAL TISSUE EXPANDERS WITH PLACEMENT OF BILATERAL SILICONE BREAST IMPLANTS (Bilateral ) LIPOFILLING FROM ABDOMEN TO CHEST (Bilateral )     Patient location during evaluation: PACU Anesthesia Type: General Level of consciousness: awake and alert Pain management: pain level controlled Vital Signs Assessment: post-procedure vital signs reviewed and stable Respiratory status: spontaneous breathing, nonlabored ventilation and respiratory function stable Cardiovascular status: blood pressure returned to baseline and stable Postop Assessment: no apparent nausea or vomiting Anesthetic complications: no    Last Vitals:  Vitals:   01/17/18 1100 01/17/18 1200  BP: 122/74 126/75  Pulse: 100   Resp: 17 20  Temp:  36.6 C  SpO2: 96% 97%    Last Pain:  Vitals:   01/17/18 1145  TempSrc:   PainSc: 3                  Keena Heesch,W. EDMOND

## 2018-01-17 NOTE — Discharge Instructions (Signed)
° °  NO TYLENOL BEFORE 12:30 PM today.    Post Anesthesia Home Care Instructions  Activity: Get plenty of rest for the remainder of the day. A responsible individual must stay with you for 24 hours following the procedure.  For the next 24 hours, DO NOT: -Drive a car -Paediatric nurse -Drink alcoholic beverages -Take any medication unless instructed by your physician -Make any legal decisions or sign important papers.  Meals: Start with liquid foods such as gelatin or soup. Progress to regular foods as tolerated. Avoid greasy, spicy, heavy foods. If nausea and/or vomiting occur, drink only clear liquids until the nausea and/or vomiting subsides. Call your physician if vomiting continues.  Special Instructions/Symptoms: Your throat may feel dry or sore from the anesthesia or the breathing tube placed in your throat during surgery. If this causes discomfort, gargle with warm salt water. The discomfort should disappear within 24 hours.  If you had a scopolamine patch placed behind your ear for the management of post- operative nausea and/or vomiting:  1. The medication in the patch is effective for 72 hours, after which it should be removed.  Wrap patch in a tissue and discard in the trash. Wash hands thoroughly with soap and water. 2. You may remove the patch earlier than 72 hours if you experience unpleasant side effects which may include dry mouth, dizziness or visual disturbances. 3. Avoid touching the patch. Wash your hands with soap and water after contact with the patch.

## 2018-01-17 NOTE — Anesthesia Preprocedure Evaluation (Addendum)
Anesthesia Evaluation  Patient identified by MRN, date of birth, ID band Patient awake    Reviewed: Allergy & Precautions, H&P , NPO status , [name] Chart, lab work & pertinent test results  History of Anesthesia Complications (+) PONV  Airway Mallampati: I  TM Distance: >3 FB Neck ROM: Full    Dental no notable dental hx. (+) Teeth Intact, Dental Advisory Given   Pulmonary neg pulmonary ROS,    Pulmonary exam normal breath sounds clear to auscultation       Cardiovascular negative cardio ROS   Rhythm:Regular Rate:Normal     Neuro/Psych negative neurological ROS  negative psych ROS   GI/Hepatic Neg liver ROS, GERD  Medicated and Controlled,  Endo/Other  negative endocrine ROS  Renal/GU negative Renal ROS  negative genitourinary   Musculoskeletal  (+) Arthritis , Osteoarthritis,    Abdominal   Peds  Hematology negative hematology ROS (+)   Anesthesia Other Findings   Reproductive/Obstetrics negative OB ROS                            Anesthesia Physical Anesthesia Plan  ASA: II  Anesthesia Plan: General   Post-op Pain Management:    Induction: Intravenous  PONV Risk Score and Plan: 4 or greater and Ondansetron, Dexamethasone, Midazolam and Scopolamine patch - Pre-op  Airway Management Planned: Oral ETT  Additional Equipment:   Intra-op Plan:   Post-operative Plan: Extubation in OR  Informed Consent: I have reviewed the patients History and Physical, chart, labs and discussed the procedure including the risks, benefits and alternatives for the proposed anesthesia with the patient or authorized representative who has indicated his/her understanding and acceptance.   Dental advisory given  Plan Discussed with: CRNA  Anesthesia Plan Comments:         Anesthesia Quick Evaluation

## 2018-01-17 NOTE — Op Note (Signed)
Operative Note   DATE OF OPERATION: 8.23.2019  LOCATION: [facility]  SURGICAL DIVISION: Plastic Surgery  PREOPERATIVE DIAGNOSES:  1. History breast cancer 2. Acquired absence breasts  POSTOPERATIVE DIAGNOSES:  same  PROCEDURE:  1. Removal bilateral tissue expanders and placement silicone implants 2. Lipofilling from abdomen to bilateral chest  SURGEON: [name] MD MBA  ASSISTANT: none  ANESTHESIA:  General.   EBL: 50 ml  COMPLICATIONS: None immediate.   INDICATIONS FOR PROCEDURE:  The patient, Jennifer Beck, is a 52 y.o. female born on Apr 19, 1966, is here for staged breast reconstruction following bilateral skin reduction pattern mastectomies with immediate prepectoral expander and acellular dermis reconstruction.   FINDINGS: Herma Carson Smooth Round Extra Projection 545 ml implants placed bilateral REF [id number] RIGHT SN [id number] LEFT SN [id number]. 60 ml fat infiltrated over each chest.  DESCRIPTION OF PROCEDURE:  The patient's operative site was marked with the patient in the preoperative area to mark sternal notch, chest midline, anterior axillary lines.Supra and infraumbilcal abdomen marked for liposuction.The patientwas taken to the operating room. SCDs were placed and IV antibiotics were given. The patient's operative site was prepped and draped in a sterile fashion. A time out was performed and all information was confirmed to be correct.I began onleftside. Incision made through prior inframammary fold scar and carried through superficial fascia tp acellular demis. ADM incised. Expander removed. Capsulotomies performedmedially and superiorly. Sizer placed.  I then directed attention torightbreast and implant cavity entered in similar manner. Expander removed and well incorporated ADM noted. Capsulotomies performed superiorly.Sizer placed and patient brought to upright sitting position. Natrelle Smooth Round Extra Projection 545 ml  implants selected for bilateral placement.The patient was returned to supine position.  Stab incision made over bilateralabdomenand tumescent fluid infiltrated over supra and infraumbilical OMAYOKH,[id number] tumescent infiltrated. Power assisted liposuction performed to endpoint symmetric contour and soft tissue thickness, total lipoaspirate320ml. The fat was then washed and prepared by gravity for infiltration. Harvested fat was then infiltrated in subcutaneous plane throughout total envelope mastectomy flaps.  Each cavity irrigated with bacitracin, Ancef, gentamicinsolution.Hemostasis ensured.Each cavity then irrigated with Betadine. The implant was placed inrightchest andimplant orientation ensured. Closure completed with 3-0 vicryl to close superficial fascia and ADM over implant. 4-0 vicryl used to close dermis followed by 4-0 monocryl subcuticular. Implant placed in left chest cavity.Closure completedin similar fashion.Abdomen incisions approximated with simple 4-0 monocryl stitch.Tissue adhesive and dry dressing applied, followed by breastbinder and abdominal compression garment.  The patient was allowed to wake from anesthesia, extubated and taken to the recovery room in satisfactory condition.   SPECIMENS: none  DRAINS: none  [name], MD [facility] [phone], pin [phone]

## 2018-01-17 NOTE — Transfer of Care (Signed)
Immediate Anesthesia Transfer of Care Note  Patient: Jennifer Beck  Procedure(s) Performed: REMOVAL OF BILATERAL TISSUE EXPANDERS WITH PLACEMENT OF BILATERAL SILICONE BREAST IMPLANTS (Bilateral ) LIPOFILLING FROM ABDOMEN TO CHEST (Bilateral )  Patient Location: PACU  Anesthesia Type:General  Level of Consciousness: awake, alert  and oriented  Airway & Oxygen Therapy: Patient Spontanous Breathing and Patient connected to face mask oxygen  Post-op Assessment: Report given to RN and Post -op Vital signs reviewed and stable  Post vital signs: Reviewed and stable  Last Vitals:  Vitals Value Taken Time  BP    Temp    Pulse 114 01/17/2018  9:41 AM  Resp 18 01/17/2018  9:41 AM  SpO2 100 % 01/17/2018  9:41 AM  Vitals shown include unvalidated device data.  Last Pain:  Vitals:   01/17/18 0639  TempSrc: Oral  PainSc: 0-No pain         Complications: No apparent anesthesia complications

## 2018-01-17 NOTE — H&P (Signed)
Subjective:     Patient ID: Jennifer Beck is a 52 y.o. female.  HPI  3 months post op bilateral mastectomies with immediate expander based reconstruction. Here for second stage reconstruction.  Presented with palpable mass right breast. MMG/US demonstrated indeterminate appearing breast mass at 12:30, 4 cmfn.No evidence of right axillary lymphadenopathy was noted. Biopsy showed IDC with LVI, triple negative. MRI showed known malignancy involving the Right UIQ of measuring 1.8 x 1.7 x 2.5 cm. Two indeterminate foci of LOQ right breast noted, overall span of NME of LOQ measured 7 cm. Addition focus of NME of LEFT UOQ and UIQ noted. Additional biopsies labeled right LIQ with invasive mammary carcinoma ER/PR+, Her2- and right LOQ read as mammary carcinoma in situ. Biopsy LEFT UOQ with ALH and LEFT UIQ with hemorrhage.  Completed neoadjuvant chemotherapy. Final MRI with interval decrease, 1 cm mass in right UIQ that previously measured 2.5 cm, resolution other areas of enhancement.  Final pathology left breast ALH, right breast 1 cm IDC with low grade DCIS, margins clear, 0/2 SLN.  Genetics negative.  Prior B/C cup, desires no larger but lifted. 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 Can work from home if needed. Her husband [name] is a Airline pilot   Objective:   Physical Exam  Cardiovascular: Normal rate, regular rhythm and normal heart sounds.   Pulmonary/Chest: Effort normal and breath sounds normal.  Abdominal: Soft.  No hernias.   minimal redundant skin Chest:  Scars maturing soft CW 13.5 cm     Assessment:     Right breast ca UOQ ER- Neoadjuvant chemotherapy S/p bilateral SRM, prepectoral TE/ADM reconstruction    Plan:  Plan removal TE placement silicone implants and lipofilling to bilateral chest.   Reviewed purpose of fat grafting to thicken flaps, help with contour, prevent visible rippling.  Reviewed donor site pain, compressions variable take graft may need to repeat, fat necrosis that presents as lumps and may require work op, redundancy skin donor site.  Reviewed saline vs silicone, smooth vs textured. Reviewed incidence ALCL with textured implants, purpose of texturing to keep implant position stable. As in prepectoral position I do recommend HCG or capacity filled silicone implants to reduce risk visible rippling. Reviewedultimate volume with implant will be larger than present but overall will be limited by CW size with regards to final implant selection. Reviewed MRI surveillance for rupture with silicone implants. Plan smooth round silicone implants, plan capacity filled.   Plan OP surgery, no drains anticipated. Recommend she purchase compression garment for use post op.  Natrelle [id number] 400 ml tissue expander placed bilateral, RIGHT fill volume 400 ml saline LEFT fill volume 400 ml saline  [name], MD [facility] [phone], pin [phone]

## 2018-01-17 NOTE — Anesthesia Procedure Notes (Signed)

## 2018-01-20 ENCOUNTER — Ambulatory Visit: Payer: 59 | Admitting: Oncology

## 2018-01-20 ENCOUNTER — Other Ambulatory Visit: Payer: 59

## 2018-01-29 NOTE — Progress Notes (Signed)
[place]  [phone] [phone]     ID: Jennifer Beck DOB: 23-Jun-1966  MR#: [id number]  WUX#:[id number]  Patient Care Team: [name], MD as PCP - General (Internal Medicine) [name], MD as Consulting Physician (Dermatology) [name], Berry as Referring Physician (Chiropractic Medicine) [name], MD as Consulting Physician (General Surgery) [name], MD as Attending Physician (Radiation Oncology) [name], MD as Consulting Physician (Obstetrics and Gynecology) Magrinat, Virgie Dad, MD as Consulting Physician (Oncology) [name], MD as Consulting Physician (Plastic Surgery) OTHER MD:  CHIEF COMPLAINT: Triple negative breast cancer  CURRENT TREATMENT: Observation  INTERVAL HISTORY: Analyse returns today for a follow-up and treatment of her triple negative breast cancer, accompanied by her husband [name].  Since her last visit, she underwent breast reconstruction surgery with bilateral expander removal and silicone implant placement on 01/17/2018.  She did well with the surgery, with no significant complications, and is very satisfied with the cosmetic result.  She is still not able to exercise fully but she has started a walking program.    REVIEW OF SYSTEMS:  Ladene is having some restless leg issues which she attributes to Lexapro.  She says this happened to her before with an antidepressant.  She also does not like the Lexapro because it decreases her libido.  At night she feels like her hands are swollen and stiff when she wakes up in the middle of the night.  This does not happen during the day.  Aside from these issues a detailed review of systems today was noncontributory  HISTORY OF CURRENT ILLNESS:  From the original intake note:  Nelida palpated a mass in her right breast sometime in September 2018.[name] She brought this to medical attention and her PCP, [name], set her  up on 03/18/2017 for a unilateral right diagnostic mammography with ultrasonography, showing: Breast density D. The palpable right breast mass at 12:30 was indeterminate, and biopsy was warranted at that time. No evidence of right axillary lymphadenopathy was noted. Biopsy of the lesion in question on 03/21/2017 at the right breast 12:30 position showed ([id number])  invasive ductal carcinoma with lymphovascular invasion.  The tumor was grade 3, triple negative, with an MIB-1 of 80%.  She is accompanied by her husband, [name] to the office today. She reports that she is doing well overall. She initially felt a lump in September in the shower and notes that she doesn't complete monthly self breast exams. She had a routine mammogram in March 2018 at [facility] and she is followed by Dr. [name].  As far as surgeries, she has had two laparoscopic procedures for endometriosis as well as a bilateral tubal ligation. She still has occasional cramping, painful ovulation, and vaginal spotting that comes and goes. She has had cyst to her breast before that have been evaluated and ruled as negative. She notes that she still has her tonsils, gallbladder, and appendix. She has a prior hx of pericarditis in 2003 that she notes was brought onby a virus. She has since been cleared by her prior Cardiologist.   She has a Restaurant manager, fast food, [name] that she sees every 5 weeks for maintenance of her neck and back issues. She has a dermatologist, [name] at  [facility] and she has had an biopsy of an area from her left chest that resulted as basal cell carcinoma in 2015. She denies having a GI specialist, Cardiologist, or Orthopedist at this time. She denies  prior history of seizures, migraines, acid reflux, asthma, emphysema, palpitations or GI issues.   [name] is as detailed below.  PAST MEDICAL HISTORY: Past Medical History:  Diagnosis Date  . Arthritis     leg and muscle pains  . Chest pain   . Complication of anesthesia   . Endometriosis   . Family history of breast cancer 04/22/2017  . Family history of colon cancer 04/22/2017  . Family history of kidney cancer 04/22/2017  . Fatigue   . GERD (gastroesophageal reflux disease)   . GI bleed    caused by ischemic colitis following an episode of hypertension  . Heart palpitations   . Hx of echocardiogram    a. echo 9/13:  EF 55-60%, Gr 2 diast dysfn  . Hypercholesterolemia   . Ischemic colitis ([place])   . Myocarditis ([place])   . Pericarditis   . PONV (postoperative nausea and vomiting)   . Restrictive pericarditis 2004  . SOB (shortness of breath)     PAST SURGICAL HISTORY: Past Surgical History:  Procedure Laterality Date  . BREAST RECONSTRUCTION WITH PLACEMENT OF TISSUE EXPANDER AND ALLODERM Bilateral 10/08/2017   Procedure: BREAST RECONSTRUCTION WITH PLACEMENT OF TISSUE EXPANDER AND ALLODERM;  Surgeon: [name], MD;  Location: [place];  Service: Plastics;  Laterality: Bilateral;  . CARDIAC CATHETERIZATION  08/26/2001   EF of 60% --- smooth & normal coronary arteries -- there is a [place] motion defect consistent with posterolateral MI -- she quite possibly had a coronary spasm -- she may have some pericarditis secondary to her MI   . DILATION AND CURETTAGE OF UTERUS  10/15/2003   Uterine enlargement, menorrhagia  . DILATION AND CURETTAGE OF UTERUS  05/27/2002   Dysfunctional uterine bleeding  . LAPAROSCOPY    . LIPOSUCTION WITH LIPOFILLING Bilateral 01/17/2018   Procedure: LIPOFILLING FROM ABDOMEN TO CHEST;  Surgeon: [name], MD;  Location: [place];  Service: Plastics;  Laterality: Bilateral;  . MASTECTOMY W/ SENTINEL NODE BIOPSY Bilateral 10/08/2017   Procedure: RIGHT MASTECTOMY WITH RIGHT AXILLARY SENTINEL LYMPH NODE BIOPSY AND LEFT PROPHYLACTIC MASTECTOMY;  Surgeon: [name], MD;  Location: [place];  Service:  General;  Laterality: Bilateral;  . NOVASURE ABLATION  10/15/2003   for management of her extreme menorrhagi  . PORTACATH PLACEMENT Right 04/02/2017   Procedure: INSERTION PORT-A-CATH;  Surgeon: [name], MD;  Location: [facility];  Service: General;  Laterality: Right;  . REMOVAL OF BILATERAL TISSUE EXPANDERS WITH PLACEMENT OF BILATERAL BREAST IMPLANTS Bilateral 01/17/2018   Procedure: REMOVAL OF BILATERAL TISSUE EXPANDERS WITH PLACEMENT OF BILATERAL SILICONE BREAST IMPLANTS;  Surgeon: [name], MD;  Location: [place];  Service: Plastics;  Laterality: Bilateral;  . TUBAL LIGATION  09/2000   bilateral    FAMILY HISTORY Family History  Problem Relation Age of Onset  . Hypertension Father   . Diabetes Father   . Other Father        stomach mass suspicious for ca, never bx  . Coronary artery disease Mother   . Kidney cancer Mother 24  . Coronary artery disease Maternal Grandfather   . Colon cancer Maternal Grandfather 61       recurrence  . Coronary artery disease Maternal Uncle   . Coronary artery disease Maternal Uncle   . Diabetes Maternal Uncle   . Colon cancer Maternal Grandmother 55  . Breast cancer Cousin 36       had GT- CHEK2+  .  Cervical cancer Maternal Aunt 35  . Diabetes Maternal Aunt   . Alzheimer's disease Paternal Grandmother   . Colon cancer Paternal Grandfather 53  . Breast cancer Paternal Aunt   . Breast cancer Paternal Aunt   . Breast cancer Other 38   Her father died 2016/02/26 at age 49 just 63 week shy of his 83rd birthday. Her mother is 34 years old as of October 2018. Patient has one brother and no sisters. Her mother was diagnosed with kidney cancer at age 58 with a nephrectomy following. Her maternal grandmother was diagnosed with colon cancer at age 13.  The patient paternal grandfather was diagnosed with colon cancer in his 43's. She has paternal cousins through her fathers half-sisters that have been diagnosed with breast  cancer, she is unsure of the number of breast cancer occurrences due to the distance . A maternal cousin was diagnosed with breast cancer at age 52 and she had genetic testing. The patient does have a copy of this testing as well as her PCP.  This was not available for review on the nasal  GYNECOLOGIC HISTORY:  No LMP recorded. Patient is postmenopausal.   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 Her husband [name] is a Airline pilot. Her daughter [name] is 78 and currently a sophomore at [employer]. 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  The patient attends [employer]. She lives in [place].      ADVANCED DIRECTIVES:    HEALTH MAINTENANCE: Social History   Tobacco Use  . Smoking status: Never Smoker  . Smokeless tobacco: Never Used  Substance Use Topics  . Alcohol use: Yes    Comment: Occasionally drinks wine  . Drug use: No     Colonoscopy: Yes, 2003  PAP: March 2018   Bone density: N/A   Allergies  Allergen Reactions  . Codeine Itching  . Biaxin [Clarithromycin]     Heart Burn    Current Outpatient Medications  Medication Sig Dispense Refill  . acetaminophen (TYLENOL) 325 MG tablet Take 650 mg by mouth every 6 (six) hours as needed.    . Cholecalciferol (VITAMIN D) 2000 units CAPS Take 2,000 Units by mouth daily.    [name] escitalopram (LEXAPRO) 10 MG tablet Take 10 mg by mouth daily.    . Estradiol 10 MCG TABS vaginal tablet INSERT 1 TABLET VAGINALLY 2 TIMES A WEEK FOR 4 WEEKS, AND THEN MAY DECREASE TO WEEKLY 8 tablet 0  . famotidine (PEPCID) 10 MG tablet Take 10 mg by mouth 2 (two) times daily.    [name] glucosamine-chondroitin 500-400 MG tablet Take 1 tablet by mouth daily.       [name] ibuprofen (ADVIL,MOTRIN) 200 MG tablet Take 400 mg by mouth every 8 (eight) hours as needed for headache or mild pain.     [name] LORazepam (ATIVAN) 0.5 MG tablet Take 1 tablet (0.5 mg total) by mouth at bedtime as needed for anxiety. 30 tablet 1  . methocarbamol (ROBAXIN) 500 MG tablet Take 1 tablet (500 mg total) by mouth every 6 (six) hours as needed for muscle spasms. 40 tablet 1  . Multiple Minerals-Vitamins (CALCIUM-MAGNESIUM-ZINC) TABS Take 1 tablet by mouth daily.     [name]  Multiple Vitamins-Minerals (MULTIVITAMINS THER. W/MINERALS) TABS Take 1 tablet by mouth daily.     [name] omeprazole (PRILOSEC) 40 MG capsule TAKE 1 CAPSULE BY MOUTH AT BEDTIME 30 capsule 0  . ondansetron (ZOFRAN) 8 MG tablet Take 1 tablet (8 mg total) by mouth every 8 (eight) hours as needed for nausea or vomiting. 20 tablet 3  . Polyethyl Glycol-Propyl Glycol (SYSTANE OP) Apply 1 drop to eye 2 (two) times daily.    [name] sulfamethoxazole-trimethoprim (BACTRIM DS,SEPTRA DS) 800-160 MG tablet Take 1 tablet by mouth 2 (two) times daily. 14 tablet 0  . venlafaxine XR (EFFEXOR-XR) 75 MG 24 hr capsule Take 1 capsule (75 mg total) by mouth daily with breakfast. 90 capsule 4   No current facility-administered medications for this visit.    Facility-Administered Medications Ordered in Other Visits  Medication Dose Route Frequency Provider Last Rate Last Dose  . sodium chloride flush (NS) 0.9 % injection 10 mL  10 mL Intracatheter PRN Magrinat, Virgie Dad, MD   10 mL at 07/04/17 1749    OBJECTIVE: Young white woman in no acute distress  There were no vitals filed for this visit.   There is no height or weight on file to calculate BMI.   Wt Readings from Last 3 Encounters:  01/17/18 150 lb 3.2 oz (68.1 kg)  10/18/17 152 lb 4.8 oz (69.1 kg)  10/08/17 156 lb 2 oz (70.8 kg)   ECOG FS:1  Hair is coming back nicely, with no bald spots Sclerae unicteric, pupils round and equal No cervical or supraclavicular adenopathy Lungs no rales or  rhonchi Heart regular rate and rhythm Abd soft, nontender, positive bowel sounds MSK no focal spinal tenderness, no upper extremity lymphedema Neuro: nonfocal, well oriented, appropriate affect Breasts: Status post bilateral mastectomies with bilateral implant reconstruction.  The cosmetic result is excellent.  There is very good symmetry.  She has not yet had her nipples done.  Both axillae are benign    LAB RESULTS:  CMP     Component Value Date/Time   NA 142 09/20/2017 0845   NA 137 05/30/2017 1219   K 4.2 09/20/2017 0845   K 3.8 05/30/2017 1219   CL 105 09/20/2017 0845   CO2 24 09/20/2017 0845   CO2 25 05/30/2017 1219   GLUCOSE 98 09/20/2017 0845   GLUCOSE 122 05/30/2017 1219   BUN 13 09/20/2017 0845   BUN 8.9 05/30/2017 1219   CREATININE 0.69 09/20/2017 0845   CREATININE 0.7 05/30/2017 1219   CALCIUM 9.7 09/20/2017 0845   CALCIUM 9.4 05/30/2017 1219   PROT 6.9 09/20/2017 0845   PROT 6.4 05/30/2017 1219   ALBUMIN 4.1 09/20/2017 0845   ALBUMIN 3.7 05/30/2017 1219   AST 30 09/20/2017 0845   AST 12 05/30/2017 1219   ALT 30 09/20/2017 0845   ALT 14 05/30/2017 1219   ALKPHOS 72 09/20/2017 0845   ALKPHOS 137 05/30/2017 1219   BILITOT 0.4 09/20/2017 0845   BILITOT 0.38 05/30/2017 1219   GFRNONAA >60 09/20/2017 0845   GFRAA >60 09/20/2017 0845    No results found for: TOTALPROTELP, ALBUMINELP, A1GS, A2GS, BETS, BETA2SER, GAMS, MSPIKE, SPEI  No results found for: KPAFRELGTCHN, LAMBDASER, KAPLAMBRATIO  Lab Results  Component Value Date   WBC 4.3 09/20/2017   NEUTROABS 2.4 09/20/2017   HGB 12.3 09/20/2017   HCT 35.3 09/20/2017   MCV 104.6 (H) 09/20/2017   PLT 265 09/20/2017    _0 @  No results found for: LABCA2  No components found for:  [id number]  No results for input(s): INR in the last 168 hours.  No results found for: LABCA2  No results found for: TZG017  No results found for: CBS496  No results found for: PRF163  No results found for:  CA2729  No components found for: HGQUANT  No results found for: CEA1 / No results found for: CEA1   No results found for: AFPTUMOR  No results found for: CHROMOGRNA  No results found for: PSA1  No visits with results within 3 Day(s) from this visit.  Latest known visit with results is:  Appointment on 09/20/2017  Component Date Value Ref Range Status  . WBC Count 09/20/2017 4.3  3.9 - 10.3 K/uL Final  . RBC 09/20/2017 3.37* 3.70 - 5.45 MIL/uL Final  . Hemoglobin 09/20/2017 12.3  11.6 - 15.9 g/dL Final  . HCT 09/20/2017 35.3  34.8 - 46.6 % Final  . MCV 09/20/2017 104.6* 79.5 - 101.0 fL Final  . MCH 09/20/2017 36.5* 25.1 - 34.0 pg Final  . MCHC 09/20/2017 34.9  31.5 - 36.0 g/dL Final  . RDW 09/20/2017 14.7* 11.2 - 14.5 % Final  . Platelet Count 09/20/2017 265  145 - 400 K/uL Final  . Neutrophils Relative % 09/20/2017 57  % Final  . Neutro Abs 09/20/2017 2.4  1.5 - 6.5 K/uL Final  . Lymphocytes Relative 09/20/2017 23  % Final  . Lymphs Abs 09/20/2017 1.0  0.9 - 3.3 K/uL Final  . Monocytes Relative 09/20/2017 12  % Final  . Monocytes Absolute 09/20/2017 0.5  0.1 - 0.9 K/uL Final  . Eosinophils Relative 09/20/2017 7  % Final  . Eosinophils Absolute 09/20/2017 0.3  0.0 - 0.5 K/uL Final  . Basophils Relative 09/20/2017 1  % Final  . Basophils Absolute 09/20/2017 0.0  0.0 - 0.1 K/uL Final   Performed at [facility], [place] [address], [place], [place] [address]  . Magnesium 09/20/2017 1.9  1.7 - 2.4 mg/dL Final   Performed at [facility], [place] [address] [place], [place] [address]  . Vit D, 25-Hydroxy 09/20/2017 39.7  30.0 - 100.0 ng/mL Final   Comment: (NOTE) Vitamin D deficiency has been defined by the [place] practice guideline as a level of serum 25-OH vitamin D less than 20 ng/mL (1,2). The Endocrine Society went on to further define vitamin D insufficiency as a level between 21 and 29  ng/mL (2). 1. IOM (Institute of Medicine). 2010. Dietary reference   intakes for calcium and D. [place]: The   [employer]. 2. Holick MF, Binkley Buxton, Bischoff-Ferrari HA, et al.   Evaluation, treatment, and prevention of vitamin D   deficiency: an Endocrine Society clinical practice   guideline. JCEM. 2011 Jul; 96(7):1911-30. Performed At: [facility] [place], [place] [id number] Rush Farmer MD ES:[id number] Performed at [facility], [place] [address] [place],  [address]     (this displays the last labs from the last 3 days)  No results found for: TOTALPROTELP, ALBUMINELP, A1GS, A2GS, BETS, BETA2SER, GAMS, MSPIKE, SPEI (this displays SPEP labs)  No results found for: KPAFRELGTCHN, LAMBDASER, KAPLAMBRATIO (kappa/lambda light chains)  No results found for: HGBA, HGBA2QUANT, HGBFQUANT, HGBSQUAN (Hemoglobinopathy evaluation)   No results found for: LDH  No results found for: IRON, TIBC, IRONPCTSAT (Iron and TIBC)  No results found for: FERRITIN  Urinalysis No results found for: COLORURINE, APPEARANCEUR, LABSPEC, Standard, GLUCOSEU, [place], BILIRUBINUR, KETONESUR,  PROTEINUR, UROBILINOGEN, NITRITE, LEUKOCYTESUR   STUDIES: No results found.  ELIGIBLE FOR AVAILABLE RESEARCH PROTOCOL: Enrolled in cancer disparities study   ASSESSMENT: 52 y.o. [place] woman status post right breast upper inner quadrant biopsy March 21, 2017 for a clinical T2 N0  Stage IIB invasive ductal carcinoma, grade 3, triple negative, with an MIB-1 of 80%.  (a) biopsy of 2 additional suspicious areas in the right breast and one in the left breast 04/11/2017 showed atypical lobular hyperplasia and sclerosing adenosis but no evidence of cancer  (1) neoadjuvant chemotherapy consisting of doxorubicin and cyclophosphamide in dose dense fashion x4 completed 05/23/2017, followed by paclitaxel/carboplatin weekly x12, first dose  06/06/2017, completed 08/29/2017  (2) status post bilateral mastectomies 10/08/2017, showing  (a) left breast, atypical lobular hyperplasia, no evidence of malignancy  (b) right breast, residual  pT1b pN0 invasive ductal carcinoma, grade 3, with negative margins.  A total of 2 sentinel lymph nodes were removed; repeat prognostic panel requested 10/18/2017  (c) immediate expander reconstruction with silicone implants on 53/29/9242  (3) adjuvant radiation not indicated  (4) genetics testing 04/22/2017 through the common hereditary cancer panel plus renal/urinary tract cancel panel showed no deleterious mutations in APC, ATM, AXIN2, BAP1, BARD1, BMPR1A, BRCA1, BRCA2, BRIP1, BUB1B, CDC73, CDH1, CDK4, CDKN1C, CDKN2A (p14ARF), CDKN2A (p16INK4a), CEP57, CHEK2, CTNNA1, DICER1, DIS3L2, EPCAM*, FH, FLCN, GPC3, GREM1*, KIT, MEN1, MET, MLH1,MSH2, MSH3, MSH6, MUTYH, NBN, NF1, PALB2, PDGFRA, PMS2, POLD1, POLE, PTEN, RAD50, RAD51C, RAD51D, SDHB,SDHC, SDHD, SMAD4, SMARCA4, SMARCB1, STK11, TP53, TSC1, TSC2, VHL, WT1.The following genes were evaluated for sequence changes only: HOXB13*, MITF*, NTHL1*, SDHA  (a) there was a Variant of Uncertain Significance identified in POLD1, namely c.2953C>T (p.Arg985Trp)  (5) declined participation in S 1418 due to concerns regarding side effects  PLAN:  Flois tolerated her treatments well and she is pretty much done with treatment for her breast cancer.  Her goal now is to become a normal person, but it will be a different normalcy and she is aware of that  In my experience it takes about a year for the joints, skin, and general attitude to become more stable.  She is still very anxious about the possibility of recurrence.  We again discussed the possibility of doing scans but my recommendation is that if she really wants them she should wait until 2 years from her definitive surgery and she is agreeable to that  She is receiving vaginal estrogens which is making a  significant improvement in her vaginal dryness issues.  Theoretically she has no breast tissue left and her tumor was triple negative so very likely this is not a concern although in general she understands we try to avoid estrogens in breast cancer cases.  She is specifically asked about soy issues and I have no problems with her eating some sort.  If the hot flashes get to be more of a problem I would consider TTS 1.  I think she would be able to tolerate it well.  She will see me again in December.  She sees her gynecologist and primary care physician in March and April.  She will then see me again in October and from that point I will start seeing her on a once a year basis  She knows to call for any other issues that may develop before the next visit here.   Magrinat, Virgie Dad, MD  01/29/18 3:07 PM Medical Oncology and Hematology [facility] [address] [place], [place] [address] Tel. [phone]  Fax. [phone]  I, Sheron Nightingale, am acting as scribe for Chauncey Cruel MD.  I, Lurline Del MD, have reviewed the above documentation for accuracy and completeness, and I agree with the above.

## 2018-01-30 ENCOUNTER — Inpatient Hospital Stay: Payer: 59 | Attending: Oncology

## 2018-01-30 ENCOUNTER — Inpatient Hospital Stay (HOSPITAL_BASED_OUTPATIENT_CLINIC_OR_DEPARTMENT_OTHER): Payer: 59 | Admitting: Oncology

## 2018-01-30 ENCOUNTER — Telehealth: Payer: Self-pay | Admitting: Oncology

## 2018-01-30 VITALS — BP 122/79 | HR 77 | Temp 98.1°F | Resp 18 | Ht 65.0 in | Wt 152.0 lb

## 2018-01-30 DIAGNOSIS — N951 Menopausal and female climacteric states: Secondary | ICD-10-CM | POA: Diagnosis not present

## 2018-01-30 DIAGNOSIS — Z853 Personal history of malignant neoplasm of breast: Secondary | ICD-10-CM | POA: Insufficient documentation

## 2018-01-30 DIAGNOSIS — Z9013 Acquired absence of bilateral breasts and nipples: Secondary | ICD-10-CM | POA: Insufficient documentation

## 2018-01-30 DIAGNOSIS — N898 Other specified noninflammatory disorders of vagina: Secondary | ICD-10-CM

## 2018-01-30 DIAGNOSIS — Z171 Estrogen receptor negative status [ER-]: Secondary | ICD-10-CM | POA: Insufficient documentation

## 2018-01-30 DIAGNOSIS — C50411 Malignant neoplasm of upper-outer quadrant of right female breast: Secondary | ICD-10-CM

## 2018-01-30 DIAGNOSIS — Z9221 Personal history of antineoplastic chemotherapy: Secondary | ICD-10-CM

## 2018-01-30 LAB — COMPREHENSIVE METABOLIC PANEL
ALK PHOS: 94 U/L (ref 38–126)
ALT: 18 U/L (ref 0–44)
AST: 18 U/L (ref 15–41)
Albumin: 4 g/dL (ref 3.5–5.0)
Anion gap: 6 (ref 5–15)
BILIRUBIN TOTAL: 0.4 mg/dL (ref 0.3–1.2)
BUN: 12 mg/dL (ref 6–20)
CO2: 31 mmol/L (ref 22–32)
Calcium: 9.7 mg/dL (ref 8.9–10.3)
Chloride: 104 mmol/L (ref 98–111)
Creatinine, Ser: 0.79 mg/dL (ref 0.44–1.00)
GFR calc Af Amer: >60 mL/min (ref >60)
Glucose, Bld: 84 mg/dL (ref 70–99)
POTASSIUM: 4.7 mmol/L (ref 3.5–5.1)
Sodium: 141 mmol/L (ref 135–145)
TOTAL PROTEIN: 7.1 g/dL (ref 6.5–8.1)

## 2018-01-30 LAB — CBC WITH DIFFERENTIAL/PLATELET
Basophils Absolute: 0 10*3/uL (ref 0.0–0.1)
Basophils Relative: 0 %
EOS ABS: 0.4 10*3/uL (ref 0.0–0.5)
EOS PCT: 6 %
HCT: 37.1 % (ref 34.8–46.6)
Hemoglobin: 12.6 g/dL (ref 11.6–15.9)
LYMPHS PCT: 28 %
Lymphs Abs: 1.6 10*3/uL (ref 0.9–3.3)
MCH: 32.4 pg (ref 25.1–34.0)
MCHC: 34 g/dL (ref 31.5–36.0)
MCV: 95.4 fL (ref 79.5–101.0)
Monocytes Absolute: 0.4 10*3/uL (ref 0.1–0.9)
Monocytes Relative: 7 %
Neutro Abs: 3.4 10*3/uL (ref 1.5–6.5)
Neutrophils Relative %: 59 %
PLATELETS: 318 10*3/uL (ref 145–400)
RBC: 3.89 MIL/uL (ref 3.70–5.45)
RDW: 12.9 % (ref 11.2–14.5)
WBC: 5.7 10*3/uL (ref 3.9–10.3)

## 2018-01-30 NOTE — Telephone Encounter (Signed)
Gave pt avs and calendar  °

## 2018-02-05 ENCOUNTER — Ambulatory Visit: Payer: 59 | Attending: Plastic Surgery | Admitting: Rehabilitation

## 2018-02-05 ENCOUNTER — Other Ambulatory Visit: Payer: Self-pay

## 2018-02-05 ENCOUNTER — Encounter: Payer: Self-pay | Admitting: Rehabilitation

## 2018-02-05 DIAGNOSIS — C50211 Malignant neoplasm of upper-inner quadrant of right female breast: Secondary | ICD-10-CM | POA: Diagnosis present

## 2018-02-05 DIAGNOSIS — R293 Abnormal posture: Secondary | ICD-10-CM | POA: Diagnosis not present

## 2018-02-05 DIAGNOSIS — Z483 Aftercare following surgery for neoplasm: Secondary | ICD-10-CM | POA: Diagnosis present

## 2018-02-05 DIAGNOSIS — M6281 Muscle weakness (generalized): Secondary | ICD-10-CM | POA: Insufficient documentation

## 2018-02-05 DIAGNOSIS — M25611 Stiffness of right shoulder, not elsewhere classified: Secondary | ICD-10-CM | POA: Diagnosis not present

## 2018-02-05 DIAGNOSIS — M25612 Stiffness of left shoulder, not elsewhere classified: Secondary | ICD-10-CM | POA: Diagnosis not present

## 2018-02-05 DIAGNOSIS — Z171 Estrogen receptor negative status [ER-]: Secondary | ICD-10-CM | POA: Diagnosis present

## 2018-02-05 NOTE — Therapy (Signed)
[place], [place], [address] Phone: [phone]   Fax:  [phone]  Physical Therapy Evaluation  Patient Details  Name: Jennifer Beck MRN: [id number] Date of Birth: [birth year] Referring Provider: Dr. Iran Planas    Encounter Date: 02/05/2018  PT End of Session - 02/05/18 0933    Visit Number  11    Number of Visits  20    Date for PT Re-Evaluation  03/17/18   due to no F/U appts until a week and a half out   Authorization - Number of Visits  20    PT Start Time  0845    PT Stop Time  0930    PT Time Calculation (min)  45 min    Activity Tolerance  Patient tolerated treatment well    Behavior During Therapy  [facility] for tasks assessed/performed       Past Medical History:  Diagnosis Date  . Arthritis    leg and muscle pains  . Chest pain   . Complication of anesthesia   . Endometriosis   . Family history of breast cancer 04/22/2017  . Family history of colon cancer 04/22/2017  . Family history of kidney cancer 04/22/2017  . Fatigue   . GERD (gastroesophageal reflux disease)   . GI bleed    caused by ischemic colitis following an episode of hypertension  . Heart palpitations   . Hx of echocardiogram    a. echo 9/13:  EF 55-60%, Gr 2 diast dysfn  . Hypercholesterolemia   . Ischemic colitis ([place])   . Myocarditis ([place])   . Pericarditis   . PONV (postoperative nausea and vomiting)   . Restrictive pericarditis 2004  . SOB (shortness of breath)     Past Surgical History:  Procedure Laterality Date  . BREAST RECONSTRUCTION WITH PLACEMENT OF TISSUE EXPANDER AND ALLODERM Bilateral 10/08/2017   Procedure: BREAST RECONSTRUCTION WITH PLACEMENT OF TISSUE EXPANDER AND ALLODERM;  Surgeon: [name], MD;  Location: [place];  Service: Plastics;  Laterality: Bilateral;  . CARDIAC CATHETERIZATION  08/26/2001   EF of 60% --- smooth & normal coronary arteries -- there is a Hakes  motion defect consistent with posterolateral MI -- she quite possibly had a coronary spasm -- she may have some pericarditis secondary to her MI   . DILATION AND CURETTAGE OF UTERUS  10/15/2003   Uterine enlargement, menorrhagia  . DILATION AND CURETTAGE OF UTERUS  05/27/2002   Dysfunctional uterine bleeding  . LAPAROSCOPY    . LIPOSUCTION WITH LIPOFILLING Bilateral 01/17/2018   Procedure: LIPOFILLING FROM ABDOMEN TO CHEST;  Surgeon: [name], MD;  Location: [place];  Service: Plastics;  Laterality: Bilateral;  . MASTECTOMY W/ SENTINEL NODE BIOPSY Bilateral 10/08/2017   Procedure: RIGHT MASTECTOMY WITH RIGHT AXILLARY SENTINEL LYMPH NODE BIOPSY AND LEFT PROPHYLACTIC MASTECTOMY;  Surgeon: [name], MD;  Location: [place];  Service: General;  Laterality: Bilateral;  . NOVASURE ABLATION  10/15/2003   for management of her extreme menorrhagi  . PORTACATH PLACEMENT Right 04/02/2017   Procedure: INSERTION PORT-A-CATH;  Surgeon: [name], MD;  [facility];  Service: General;  Laterality: Right;  . REMOVAL OF BILATERAL TISSUE EXPANDERS WITH PLACEMENT OF BILATERAL BREAST IMPLANTS Bilateral 01/17/2018   Procedure: REMOVAL OF BILATERAL TISSUE EXPANDERS WITH PLACEMENT OF BILATERAL SILICONE BREAST IMPLANTS;  Surgeon: [name], MD;  Location: [place];  Service: Plastics;  Laterality: Bilateral;  . TUBAL LIGATION  09/2000  bilateral    There were no vitals filed for this visit.   Subjective Assessment - 02/05/18 0848    Subjective  I got better doing therapy at [facility].  Since that I have had my implant placement with some fat grafting at the superior breast.  Some itchiness here.  A lot of brusing on the Lt side the Rt side seems ok.      Pertinent History  double mastectomy Oct 08, 2017, with immediated expander placement 2 sentinel lymph nodes on the right, (  estrogen receptor positive breast cancer) chemotherapy from  november til april 4  did well, occasional toe and finger numbness.  Pt will not have to have radiation , implant placement 01/17/18    Patient Stated Goals  The Lt side is just tight     Currently in Pain?  No/denies   more tightness    Pain Location  Chest    Pain Orientation  Left    Pain Descriptors / Indicators  Tightness    Pain Type  Surgical pain         [facility] PT Assessment - 02/05/18 0001      Assessment   Medical Diagnosis  Malignant neoplasm of upper-outer quadrant of right breast in female, estrogen receptor negative [facility])  - Primary     Referring Provider  Dr. Iran Planas     Onset Date/Surgical Date  10/08/17    Hand Dominance  Right    Prior Therapy  No      Precautions   Precaution Comments  cancer, lymphedema      Restrictions   Weight Bearing Restrictions  No      Balance Screen   Has the patient fallen in the past 6 months  No    Has the patient had a decrease in activity level because of a fear of falling?   No    Is the patient reluctant to leave their home because of a fear of falling?   No      Home Film/video editor residence    Living Arrangements  Spouse/significant other    Available Help at Discharge  Family      Prior Function   Vocation  Full time employment    Vocation Requirements  sitting    Leisure  wants to start exercising      Cognition   Overall Cognitive Status  Within Functional Limits for tasks assessed      Observation/Other Assessments   Observations  doing well 2 weeks post operatively     Skin Integrity  well healed     Quick DASH   29.55      Coordination   Gross Motor Movements are Fluid and Coordinated  Yes      Posture/Postural Control   Posture/Postural Control  Postural limitations    Postural Limitations  Rounded Shoulders    Posture Comments  decreased thoracic mobility       ROM / Strength   AROM / PROM / Strength  PROM      AROM   Right Shoulder Flexion  145 Degrees    Right  Shoulder ABduction  150 Degrees    Left Shoulder Flexion  142 Degrees   tightness into pectoralis   Left Shoulder ABduction  143 Degrees      PROM   Overall PROM Comments  no significant limitations but into overhead pectoralis stretch position       Strength   Right/Left Shoulder  Right;Left    Right Shoulder Flexion  4/5    Right Shoulder Extension  4/5    Right Shoulder ABduction  4/5    Right Shoulder Internal Rotation  4/5    Right Shoulder External Rotation  4/5    Left Shoulder Flexion  4/5    Left Shoulder Extension  4/5    Left Shoulder ABduction  4/5    Left Shoulder Internal Rotation  4/5    Left Shoulder External Rotation  4/5      Palpation   Palpation comment  seroma type lump in the Lt axilla on non cancer side, significant tightness Lt pectoralis near shoulder and implant laterally        LYMPHEDEMA/ONCOLOGY QUESTIONNAIRE - 02/05/18 0931      Type   Cancer Type  Right breast cancer      Surgeries   Mastectomy Date  10/08/17    Sentinel Lymph Node Biopsy Date  10/08/17    Number Lymph Nodes Removed  2      Treatment   Past Chemotherapy Treatment  Yes    Active Radiation Treatment  No    Past Radiation Treatment  No      What other symptoms do you have   Are you Having Heaviness or Tightness  No    Are you having Pain  No    Are you having pitting edema  No    Is it Hard or Difficult finding clothes that fit  No    Do you have infections  No    Is there Decreased scar mobility  Yes    Stemmer Sign  No      Lymphedema Assessments   Lymphedema Assessments  Upper extremities      Right Upper Extremity Lymphedema   10 cm Proximal to Olecranon Process  --    Olecranon Process  --    10 cm Proximal to Ulnar Styloid Process  --    Just Proximal to Ulnar Styloid Process  --    Across Hand at [employer]  --    At [employer] of 2nd Digit  --      Left Upper Extremity Lymphedema   10 cm Proximal to Olecranon Process  --    Olecranon Process  --    10  cm Proximal to Ulnar Styloid Process  --    Just Proximal to Ulnar Styloid Process  --    Across Hand at [employer]  --    At [place] of 2nd Digit  --          Jennifer Beck - 02/05/18 0001    Open a tight or new jar  Mild difficulty    Do heavy household chores (wash walls, wash floors)  Unable    Carry a shopping bag or briefcase  Mild difficulty    Wash your back  No difficulty    Use a knife to cut food  No difficulty    Recreational activities in which you take some force or impact through your arm, shoulder, or hand (golf, hammering, tennis)  Mild difficulty    During the past week, to what extent has your arm, shoulder or hand problem interfered with your normal social activities with family, friends, neighbors, or groups?  Not at all    During the past week, to what extent has your arm, shoulder or hand problem limited your work or other regular daily activities  Slightly    Arm, shoulder, or  hand pain.  Mild    Tingling (pins and needles) in your arm, shoulder, or hand  Moderate    Difficulty Sleeping  Moderate difficulty    DASH Score  29.55 %        Objective measurements completed on examination: See above findings.      [facility] Adult PT Treatment/Exercise - 02/05/18 0001      Exercises   Exercises  Other Exercises    Other Exercises   education on HEP with performance of median nerve flossing bilateral x 10 cueing as needed, and new doorway stretch 3x20" with cueing as needed             PT Education - 02/05/18 0933    Education provided  Yes    Education Details  current HEP to focus on Lt shoulder tightness    Person(s) Educated  Patient    Methods  Explanation;Demonstration;Tactile cues;Verbal cues;Handout    Comprehension  Verbalized understanding;Returned demonstration       PT Short Term Goals - 12/11/17 0926      PT SHORT TERM GOAL #3   Title  Pt will have right and left shoulder abduction > 125 degrees so that she can more easily wash her  hair     Status  Achieved        PT Long Term Goals - 02/05/18 1143      PT LONG TERM GOAL #1   Title  ind with advanced HEP    Time  4    Period  Weeks    Status  New    Target Date  03/17/18      PT LONG TERM GOAL #2   Title  Pt will improve Lt shoulder flexion back to 154 or greater    Time  4    Period  Weeks    Status  New    Target Date  03/17/18      PT LONG TERM GOAL #3   Title  Pt will be able to shut the trunk of her car without limitations     Time  4    Period  Weeks    Status  New    Target Date  03/17/18      PT LONG TERM GOAL #4   Title  Pt will be able to perform quadruped and/or plank position type exercise to return to yoga    Time  4    Period  Weeks    Status  New    Target Date  03/17/18             Plan - 02/05/18 0929    Clinical Impression Statement  Jennifer Beck returns to cancer rehab after removal of expanders and placement of implants on 01/17/18.  She reports the Rt side where the cancer was is feeling fine and the Lt side is still very tight and giving her trouble.  Her shoulder ROM has decreased about 10 degrees in flexion but the rest of them remain the same.  She has a seroma type lump in the Lt axilla and more firmness to the implant laterally and tightness into the pectoralis muscle.  She is also experiencing bilateral neural tension type pain and tingling with prolonged positioning in median nerve position.  She will benefit from a few sessions of PT at this time to improve the status of the Lt shoulder so she can return to previous exercises and yoga.      Rehab Potential  Excellent  PT Frequency  2x / week    PT Duration  4 weeks    PT Treatment/Interventions  ADLs/Self Care Home Management;Biofeedback;Electrical Stimulation;Moist Heat;Cryotherapy;Therapeutic activities;Therapeutic exercise;Neuromuscular re-education;Patient/family education;Passive range of motion;Scar mobilization;Manual techniques;Dry needling;Taping;Manual lymph  drainage    PT Next Visit Plan  STM Lt shoulder complex, pectoralis, and axilla, MLD as needed in axilla.  Progressing patient to return to exercise and being able to do plank and yoga type movements     PT Home Exercise Plan  ([email or web address] from [facility])  Access Code: [email or web address]    Consulted and Agree with Plan of Care  Patient       Patient will benefit from skilled therapeutic intervention in order to improve the following deficits and impairments:  Decreased skin integrity, Increased fascial restricitons, Decreased mobility, Decreased scar mobility, Postural dysfunction, Decreased activity tolerance  Visit Diagnosis: Stiffness of right shoulder joint  Stiffness of left shoulder joint  Abnormal posture  Aftercare following surgery for neoplasm  Malignant neoplasm of upper-inner quadrant of right breast in female, estrogen receptor negative ([place])     Problem List Patient Active Problem List   Diagnosis Date Noted  . Genetic testing 05/02/2017  . Family history of breast cancer 04/22/2017  . Family history of colon cancer 04/22/2017  . Family history of kidney cancer 04/22/2017  . Malignant neoplasm of upper-outer quadrant of right breast in female, estrogen receptor negative ([place]) 03/26/2017  . Chest tightness 03/21/2012    Shan Levans, PT 02/05/2018, 11:46 AM  [place], [place], [address] Phone: [phone]   Fax:  [phone]  Name: Jennifer Beck MRN: [id number] Date of Birth: 21-Jan-1966

## 2018-02-05 NOTE — Patient Instructions (Signed)
Access Code: [email or web address]  URL: [email or web address]  Date: 02/05/2018  Prepared by: Shan Levans   Exercises  Standing Median Nerve Glide - 10 reps - 1 sets - 1x daily - 7x weekly  Doorway Pec Stretch at 120 Degrees Abduction - 10 reps - 3 sets - 1x daily - 7x weekly  Sidelying Thoracic Rotation with Open Book - 10 reps - 1 sets - 1x daily - 7x weekly  Pec Minor Stretch - 3 reps - 1 sets - 30 hold - 1x daily -

## 2018-02-07 ENCOUNTER — Encounter: Payer: Self-pay | Admitting: Physical Therapy

## 2018-02-07 ENCOUNTER — Ambulatory Visit: Payer: 59 | Admitting: Physical Therapy

## 2018-02-07 DIAGNOSIS — M25611 Stiffness of right shoulder, not elsewhere classified: Secondary | ICD-10-CM | POA: Diagnosis not present

## 2018-02-07 DIAGNOSIS — R293 Abnormal posture: Secondary | ICD-10-CM

## 2018-02-07 DIAGNOSIS — Z483 Aftercare following surgery for neoplasm: Secondary | ICD-10-CM

## 2018-02-07 DIAGNOSIS — M25612 Stiffness of left shoulder, not elsewhere classified: Secondary | ICD-10-CM

## 2018-02-07 DIAGNOSIS — M6281 Muscle weakness (generalized): Secondary | ICD-10-CM

## 2018-02-07 NOTE — Patient Instructions (Signed)
OPTP: orthopedic physical therapy products   Soft foam roller : ProRoller soft

## 2018-02-07 NOTE — Therapy (Signed)
[place], [place], [address] Phone: [phone]   Fax:  [phone]  Physical Therapy Treatment  Patient Details  Name: Jennifer Beck MRN: [id number] Date of Birth: March 29, 1966 Referring Provider: Dr. Iran Planas    Encounter Date: 02/07/2018  PT End of Session - 02/07/18 1256    Visit Number  12    Number of Visits  20    Date for PT Re-Evaluation  03/17/18    Activity Tolerance  Patient tolerated treatment well    Behavior During Therapy  [facility] for tasks assessed/performed       Past Medical History:  Diagnosis Date  . Arthritis    leg and muscle pains  . Chest pain   . Complication of anesthesia   . Endometriosis   . Family history of breast cancer 04/22/2017  . Family history of colon cancer 04/22/2017  . Family history of kidney cancer 04/22/2017  . Fatigue   . GERD (gastroesophageal reflux disease)   . GI bleed    caused by ischemic colitis following an episode of hypertension  . Heart palpitations   . Hx of echocardiogram    a. echo 9/13:  EF 55-60%, Gr 2 diast dysfn  . Hypercholesterolemia   . Ischemic colitis ([place])   . Myocarditis ([place])   . Pericarditis   . PONV (postoperative nausea and vomiting)   . Restrictive pericarditis 2004  . SOB (shortness of breath)     Past Surgical History:  Procedure Laterality Date  . BREAST RECONSTRUCTION WITH PLACEMENT OF TISSUE EXPANDER AND ALLODERM Bilateral 10/08/2017   Procedure: BREAST RECONSTRUCTION WITH PLACEMENT OF TISSUE EXPANDER AND ALLODERM;  Surgeon: [name], MD;  Location: [place];  Service: Plastics;  Laterality: Bilateral;  . CARDIAC CATHETERIZATION  08/26/2001   EF of 60% --- smooth & normal coronary arteries -- there is a [place] motion defect consistent with posterolateral MI -- she quite possibly had a coronary spasm -- she may have some pericarditis secondary to her MI   . DILATION AND CURETTAGE OF  UTERUS  10/15/2003   Uterine enlargement, menorrhagia  . DILATION AND CURETTAGE OF UTERUS  05/27/2002   Dysfunctional uterine bleeding  . LAPAROSCOPY    . LIPOSUCTION WITH LIPOFILLING Bilateral 01/17/2018   Procedure: LIPOFILLING FROM ABDOMEN TO CHEST;  Surgeon: [name], MD;  Location: [facility];  Service: Plastics;  Laterality: Bilateral;  . MASTECTOMY W/ SENTINEL NODE BIOPSY Bilateral 10/08/2017   Procedure: RIGHT MASTECTOMY WITH RIGHT AXILLARY SENTINEL LYMPH NODE BIOPSY AND LEFT PROPHYLACTIC MASTECTOMY;  Surgeon: [name], MD;  Location: [facility];  Service: General;  Laterality: Bilateral;  . NOVASURE ABLATION  10/15/2003   for management of her extreme menorrhagi  . PORTACATH PLACEMENT Right 04/02/2017   Procedure: INSERTION PORT-A-CATH;  Surgeon: [name], MD;  [facility];  Service: General;  Laterality: Right;  . REMOVAL OF BILATERAL TISSUE EXPANDERS WITH PLACEMENT OF BILATERAL BREAST IMPLANTS Bilateral 01/17/2018   Procedure: REMOVAL OF BILATERAL TISSUE EXPANDERS WITH PLACEMENT OF BILATERAL SILICONE BREAST IMPLANTS;  Surgeon: [name], MD;  Location: [facility];  Service: Plastics;  Laterality: Bilateral;  . TUBAL LIGATION  09/2000   bilateral    There were no vitals filed for this visit.  Subjective Assessment - 02/07/18 0935    Subjective  Pt states she has been having hot flashes and is taking lexapro for that. since then she has had more restless legs and symptoms  of bialteral hand numbess at night. She still feels tightness in her left axilla and anterior chest     Pertinent History  double mastectomy Oct 08, 2017, with immediated expander placement 2 sentinel lymph nodes on the right, (  estrogen receptor negative breast cancer) chemotherapy from november til april 4  did well, occasional toe and finger numbness.  Pt will not have to have radiation , implant placement 01/17/18    Patient Stated Goals   The Lt side is just tight     Currently in Pain?  No/denies   tightness    Pain Location  Chest                       [facility] Adult PT Treatment/Exercise - 02/07/18 0001      Exercises   Exercises  Shoulder;Lumbar    Other Exercises   mentioned yoga classes at the [place] inthe evenings and aqua exercise at [employer] on Saturdays      Lumbar Exercises: Stretches   Other Lumbar Stretch Exercise  modified downward dog with hands on counter top and aslo tried it at [place] for total body stretch and cat/cow for more spinal stretch       Shoulder Exercises: Supine   Protraction  AROM;Right;Left;10 reps    Horizontal ABduction  AROM;Right;Left;10 reps    Horizontal ABduction Limitations  over purple squishy ball and over vertical foam roller     Flexion  AROM;Right;Left;10 reps    Flexion Limitations  on foam roller     Other Supine Exercises  scapular retraction on ball and foam roller to open up chest       Shoulder Exercises: Sidelying   ABduction  AROM;Left;10 reps    Other Sidelying Exercises  small circles with hand pointed to ceiling.     Other Sidelying Exercises  on foam roller at axilla, forward and back for myofascial release       Manual Therapy   Manual Therapy  Myofascial release    Manual therapy comments  instructed pt in self myofascial release to anterior pec major area     Myofascial Release  fascial release with prolonged pressure at painful trigger points around left scapula                PT Short Term Goals - 12/11/17 0926      PT SHORT TERM GOAL #3   Title  Pt will have right and left shoulder abduction > 125 degrees so that she can more easily wash her hair     Status  Achieved        PT Long Term Goals - 02/05/18 1143      PT LONG TERM GOAL #1   Title  ind with advanced HEP    Time  4    Period  Weeks    Status  New    Target Date  03/17/18      PT LONG TERM GOAL #2   Title  Pt will improve Lt shoulder flexion back to  154 or greater    Time  4    Period  Weeks    Status  New    Target Date  03/17/18      PT LONG TERM GOAL #3   Title  Pt will be able to shut the trunk of her car without limitations     Time  4    Period  Weeks    Status  New    Target Date  03/17/18      PT LONG TERM GOAL #4   Title  Pt will be able to perform quadruped and/or plank position type exercise to return to yoga    Time  4    Period  Weeks    Status  New    Target Date  03/17/18            Plan - 02/07/18 1257    Clinical Impression Statement  Pt continues to have tender tightness in her left pec major and scapulare muscles.  Instructed in self myofascial release with hand as well as on foam roller and did lots active stretching.  Pt has not gone to formal yoga exercise class but wants to do some at home     Clinical Impairments Affecting Rehab Potential  previous chemotherapy     PT Frequency  2x / week    PT Duration  4 weeks    PT Treatment/Interventions  ADLs/Self Care Home Management;Biofeedback;Electrical Stimulation;Moist Heat;Cryotherapy;Therapeutic activities;Therapeutic exercise;Neuromuscular re-education;Patient/family education;Passive range of motion;Scar mobilization;Manual techniques;Dry needling;Taping;Manual lymph drainage    PT Next Visit Plan  STM Lt shoulder complex, pectoralis, and axilla, MLD as needed in axilla.  Teach Warrior one and two with good alighment., Cat/ Cow in quadruped with childs pose, deep breathing, tree pose, forearm Mussell plank Progressing patient to return to exercise and being able to do plank and yoga type movements     Consulted and Agree with Plan of Care  Patient       Patient will benefit from skilled therapeutic intervention in order to improve the following deficits and impairments:  Decreased skin integrity, Increased fascial restricitons, Decreased mobility, Decreased scar mobility, Postural dysfunction, Decreased activity tolerance  Visit Diagnosis: Stiffness of  right shoulder joint  Stiffness of left shoulder joint  Abnormal posture  Aftercare following surgery for neoplasm  Muscle weakness (generalized)     Problem List Patient Active Problem List   Diagnosis Date Noted  . Genetic testing 05/02/2017  . Family history of breast cancer 04/22/2017  . Family history of colon cancer 04/22/2017  . Family history of kidney cancer 04/22/2017  . Malignant neoplasm of upper-outer quadrant of right breast in female, estrogen receptor negative ([place]) 03/26/2017  . Chest tightness 03/21/2012   Donato Heinz. Owens Shark PT  Norwood Levo 02/07/2018, 1:01 PM  [place], [place], [address] Phone: [phone]   Fax:  [phone]  Name: Jennifer Beck MRN: [id number] Date of Birth: February 28, 1966

## 2018-02-10 ENCOUNTER — Telehealth: Payer: Self-pay | Admitting: Adult Health

## 2018-02-10 ENCOUNTER — Encounter: Payer: Self-pay | Admitting: *Deleted

## 2018-02-10 NOTE — Telephone Encounter (Signed)
Mailed pt calendar of upcoming appts per 9/16 sch message  °

## 2018-02-14 ENCOUNTER — Encounter

## 2018-02-17 ENCOUNTER — Ambulatory Visit: Payer: 59 | Admitting: Rehabilitation

## 2018-02-17 ENCOUNTER — Encounter: Payer: Self-pay | Admitting: Rehabilitation

## 2018-02-17 DIAGNOSIS — Z171 Estrogen receptor negative status [ER-]: Secondary | ICD-10-CM

## 2018-02-17 DIAGNOSIS — C50211 Malignant neoplasm of upper-inner quadrant of right female breast: Secondary | ICD-10-CM

## 2018-02-17 DIAGNOSIS — Z483 Aftercare following surgery for neoplasm: Secondary | ICD-10-CM

## 2018-02-17 DIAGNOSIS — M25612 Stiffness of left shoulder, not elsewhere classified: Secondary | ICD-10-CM

## 2018-02-17 DIAGNOSIS — M25611 Stiffness of right shoulder, not elsewhere classified: Secondary | ICD-10-CM | POA: Diagnosis not present

## 2018-02-17 DIAGNOSIS — R293 Abnormal posture: Secondary | ICD-10-CM

## 2018-02-17 DIAGNOSIS — M6281 Muscle weakness (generalized): Secondary | ICD-10-CM

## 2018-02-17 NOTE — Therapy (Signed)
[place], [place], [address] Phone: [phone]   Fax:  [phone]  Physical Therapy Treatment  Patient Details  Name: Jennifer Beck MRN: [id number] Date of Birth: 11/29/1965 Referring Provider: Dr. Iran Planas    Encounter Date: 02/17/2018  PT End of Session - 02/17/18 0819    Visit Number  13    Number of Visits  20    Date for PT Re-Evaluation  03/17/18    Authorization - Visit Number  10    Authorization - Number of Visits  20    PT Start Time  0803    PT Stop Time  0845    PT Time Calculation (min)  42 min    Activity Tolerance  Patient tolerated treatment well    Behavior During Therapy  [facility] for tasks assessed/performed       Past Medical History:  Diagnosis Date  . Arthritis    leg and muscle pains  . Chest pain   . Complication of anesthesia   . Endometriosis   . Family history of breast cancer 04/22/2017  . Family history of colon cancer 04/22/2017  . Family history of kidney cancer 04/22/2017  . Fatigue   . GERD (gastroesophageal reflux disease)   . GI bleed    caused by ischemic colitis following an episode of hypertension  . Heart palpitations   . Hx of echocardiogram    a. echo 9/13:  EF 55-60%, Gr 2 diast dysfn  . Hypercholesterolemia   . Ischemic colitis ([place])   . Myocarditis ([place])   . Pericarditis   . PONV (postoperative nausea and vomiting)   . Restrictive pericarditis 2004  . SOB (shortness of breath)     Past Surgical History:  Procedure Laterality Date  . BREAST RECONSTRUCTION WITH PLACEMENT OF TISSUE EXPANDER AND ALLODERM Bilateral 10/08/2017   Procedure: BREAST RECONSTRUCTION WITH PLACEMENT OF TISSUE EXPANDER AND ALLODERM;  Surgeon: [name], MD;  Location: [place];  Service: Plastics;  Laterality: Bilateral;  . CARDIAC CATHETERIZATION  08/26/2001   EF of 60% --- smooth & normal coronary arteries -- there is a [place] motion defect  consistent with posterolateral MI -- she quite possibly had a coronary spasm -- she may have some pericarditis secondary to her MI   . DILATION AND CURETTAGE OF UTERUS  10/15/2003   Uterine enlargement, menorrhagia  . DILATION AND CURETTAGE OF UTERUS  05/27/2002   Dysfunctional uterine bleeding  . LAPAROSCOPY    . LIPOSUCTION WITH LIPOFILLING Bilateral 01/17/2018   Procedure: LIPOFILLING FROM ABDOMEN TO CHEST;  Surgeon: [name], MD;  Location: [place];  Service: Plastics;  Laterality: Bilateral;  . MASTECTOMY W/ SENTINEL NODE BIOPSY Bilateral 10/08/2017   Procedure: RIGHT MASTECTOMY WITH RIGHT AXILLARY SENTINEL LYMPH NODE BIOPSY AND LEFT PROPHYLACTIC MASTECTOMY;  Surgeon: [name], MD;  Location: [place];  Service: General;  Laterality: Bilateral;  . NOVASURE ABLATION  10/15/2003   for management of her extreme menorrhagi  . PORTACATH PLACEMENT Right 04/02/2017   Procedure: INSERTION PORT-A-CATH;  Surgeon: [name], MD;  Location[facility];  Service: General;  Laterality: Right;  . REMOVAL OF BILATERAL TISSUE EXPANDERS WITH PLACEMENT OF BILATERAL BREAST IMPLANTS Bilateral 01/17/2018   Procedure: REMOVAL OF BILATERAL TISSUE EXPANDERS WITH PLACEMENT OF BILATERAL SILICONE BREAST IMPLANTS;  Surgeon: [name], MD;  Location: [place];  Service: Plastics;  Laterality: Bilateral;  . TUBAL LIGATION  09/2000   bilateral  There were no vitals filed for this visit.  Subjective Assessment - 02/17/18 0804    Subjective  Got a foam roller 2 days ago and have been using them      Pertinent History  double mastectomy Oct 08, 2017, with immediated expander placement 2 sentinel lymph nodes on the right, (  estrogen receptor negative breast cancer) chemotherapy from november til april 4  did well, occasional toe and finger numbness.  Pt will not have to have radiation , implant placement 01/17/18    Currently in Pain?  No/denies          [facility] PT Assessment - 02/17/18 0001      ROM / Strength   AROM / PROM / Strength  Strength      Strength   Overall Strength Comments  R: 42 pounds L: 40 pounds                   [facility] Adult PT Treatment/Exercise - 02/17/18 0001      Lumbar Exercises: Quadruped   Other Quadruped Lumbar Exercises  cat/cow x 10, child's pose 30sec mid/Lt/Rt, open book stretch Lt 5"x10      Shoulder Exercises: ROM/Strengthening   Other ROM/Strengthening Exercises  supine over foam roller pectoralis minor and major stretches x 90sec each while PT went to get finger putty from OT.  Alternating overhead flexion 2# x 10 bil, bil 2# pro/ret x 10, alternating reaches toward the feet x 10, Rt diag x 10 red band       Manual Therapy   Soft tissue mobilization  to Lt pectoralis, UT, Levator, Rhomboids in supine and sideyling with PROM  into endrange incorporated              PT Education - 02/17/18 0818    Education provided  Yes    Education Details  putty and finger strengthening    Person(s) Educated  Patient    Methods  Explanation;Demonstration;Handout    Comprehension  Verbalized understanding       PT Short Term Goals - 12/11/17 0926      PT SHORT TERM GOAL #3   Title  Pt will have right and left shoulder abduction > 125 degrees so that she can more easily wash her hair     Status  Achieved        PT Long Term Goals - 02/05/18 1143      PT LONG TERM GOAL #1   Title  ind with advanced HEP    Time  4    Period  Weeks    Status  New    Target Date  03/17/18      PT LONG TERM GOAL #2   Title  Pt will improve Lt shoulder flexion back to 154 or greater    Time  4    Period  Weeks    Status  New    Target Date  03/17/18      PT LONG TERM GOAL #3   Title  Pt will be able to shut the trunk of her car without limitations     Time  4    Period  Weeks    Status  New    Target Date  03/17/18      PT LONG TERM GOAL #4   Title  Pt will be able to perform quadruped  and/or plank position type exercise to return to yoga    Time  4    Period  Weeks    Status  New    Target Date  03/17/18            Plan - 02/17/18 0820    Clinical Impression Statement  Continues with lt shoulder feelings of tightness and bilateral hand weakness but reports all exercises today feel the same between sides.  grip strength was about equal between sides at 40-42 but less than the normative values.  Given putty and a handout on hand strengthening exercises just to play with.      Clinical Impairments Affecting Rehab Potential  previous chemotherapy     PT Frequency  2x / week    PT Duration  4 weeks    PT Treatment/Interventions  ADLs/Self Care Home Management;Biofeedback;Electrical Stimulation;Moist Heat;Cryotherapy;Therapeutic activities;Therapeutic exercise;Neuromuscular re-education;Patient/family education;Passive range of motion;Scar mobilization;Manual techniques;Dry needling;Taping;Manual lymph drainage    PT Next Visit Plan  STM Lt shoulder complex, pectoralis, and axilla, MLD as needed in axilla.  Teach Warrior one and two with good alighment., Progressing patient to return to exercise and being able to do plank and yoga type movements     PT Home Exercise Plan  ([email or web address] from [employer])  Access Code: [email or web address]    Consulted and Agree with Plan of Care  Patient       Patient will benefit from skilled therapeutic intervention in order to improve the following deficits and impairments:  Decreased skin integrity, Increased fascial restricitons, Decreased mobility, Decreased scar mobility, Postural dysfunction, Decreased activity tolerance  Visit Diagnosis: Stiffness of right shoulder joint  Stiffness of left shoulder joint  Abnormal posture  Aftercare following surgery for neoplasm  Muscle weakness (generalized)  Malignant neoplasm of upper-inner quadrant of right breast in female, estrogen receptor negative ([place])     Problem List Patient Active Problem  List   Diagnosis Date Noted  . Genetic testing 05/02/2017  . Family history of breast cancer 04/22/2017  . Family history of colon cancer 04/22/2017  . Family history of kidney cancer 04/22/2017  . Malignant neoplasm of upper-outer quadrant of right breast in female, estrogen receptor negative ([place]) 03/26/2017  . Chest tightness 03/21/2012    Shan Levans, PT 02/17/2018, 8:46 AM  [place], [place], [address] Phone: [phone]   Fax:  [phone]  Name: Jennifer Beck MRN: [id number] Date of Birth: Nov 04, 1965

## 2018-02-17 NOTE — Patient Instructions (Signed)
Access Code: [facility]  URL: [email or web address]  Date: 02/17/2018  Prepared by: Shan Levans   Exercises  Finger Exension with Putty - 10 reps - 3 sets - 1x daily - 7x weekly  Key Pinch with Putty - 10 reps - 3 sets - 1x daily - 7x weekly  Thumb Opposition with Putty - 10 reps - 3 sets - 1x daily - 7x weekly  Putty Squeezes - 10 reps - 3 sets - 1x daily - 7x weekly

## 2018-02-19 ENCOUNTER — Encounter: Payer: Self-pay | Admitting: Rehabilitation

## 2018-02-19 ENCOUNTER — Ambulatory Visit: Payer: 59 | Admitting: Rehabilitation

## 2018-02-19 DIAGNOSIS — M25611 Stiffness of right shoulder, not elsewhere classified: Secondary | ICD-10-CM

## 2018-02-19 DIAGNOSIS — N951 Menopausal and female climacteric states: Secondary | ICD-10-CM | POA: Diagnosis not present

## 2018-02-19 DIAGNOSIS — M25612 Stiffness of left shoulder, not elsewhere classified: Secondary | ICD-10-CM

## 2018-02-19 DIAGNOSIS — R293 Abnormal posture: Secondary | ICD-10-CM

## 2018-02-19 DIAGNOSIS — Z483 Aftercare following surgery for neoplasm: Secondary | ICD-10-CM

## 2018-02-19 DIAGNOSIS — Z23 Encounter for immunization: Secondary | ICD-10-CM | POA: Diagnosis not present

## 2018-02-19 NOTE — Therapy (Signed)
[place], [place], [address] Phone: [phone]   Fax:  [phone]  Physical Therapy Treatment  Patient Details  Name: Jennifer Beck MRN: [id number] Date of Birth: [birth year] Referring Provider: Dr. Iran Planas    Encounter Date: 02/19/2018  PT End of Session - 02/19/18 1117    Visit Number  14    Number of Visits  20    Date for PT Re-Evaluation  03/17/18    PT Start Time  1103    PT Stop Time  1148    PT Time Calculation (min)  45 min    Activity Tolerance  Patient tolerated treatment well    Behavior During Therapy  [facility] for tasks assessed/performed       Past Medical History:  Diagnosis Date  . Arthritis    leg and muscle pains  . Chest pain   . Complication of anesthesia   . Endometriosis   . Family history of breast cancer 04/22/2017  . Family history of colon cancer 04/22/2017  . Family history of kidney cancer 04/22/2017  . Fatigue   . GERD (gastroesophageal reflux disease)   . GI bleed    caused by ischemic colitis following an episode of hypertension  . Heart palpitations   . Hx of echocardiogram    a. echo 9/13:  EF 55-60%, Gr 2 diast dysfn  . Hypercholesterolemia   . Ischemic colitis ([place])   . Myocarditis ([place])   . Pericarditis   . PONV (postoperative nausea and vomiting)   . Restrictive pericarditis 2004  . SOB (shortness of breath)     Past Surgical History:  Procedure Laterality Date  . BREAST RECONSTRUCTION WITH PLACEMENT OF TISSUE EXPANDER AND ALLODERM Bilateral 10/08/2017   Procedure: BREAST RECONSTRUCTION WITH PLACEMENT OF TISSUE EXPANDER AND ALLODERM;  Surgeon: [name], MD;  Location: [place];  Service: Plastics;  Laterality: Bilateral;  . CARDIAC CATHETERIZATION  08/26/2001   EF of 60% --- smooth & normal coronary arteries -- there is a [place] motion defect consistent with posterolateral MI -- she quite possibly had a coronary spasm --  she may have some pericarditis secondary to her MI   . DILATION AND CURETTAGE OF UTERUS  10/15/2003   Uterine enlargement, menorrhagia  . DILATION AND CURETTAGE OF UTERUS  05/27/2002   Dysfunctional uterine bleeding  . LAPAROSCOPY    . LIPOSUCTION WITH LIPOFILLING Bilateral 01/17/2018   Procedure: LIPOFILLING FROM ABDOMEN TO CHEST;  Surgeon: [name], MD;  Location: [place];  Service: Plastics;  Laterality: Bilateral;  . MASTECTOMY W/ SENTINEL NODE BIOPSY Bilateral 10/08/2017   Procedure: RIGHT MASTECTOMY WITH RIGHT AXILLARY SENTINEL LYMPH NODE BIOPSY AND LEFT PROPHYLACTIC MASTECTOMY;  Surgeon: [name], MD;  Location: [place];  Service: General;  Laterality: Bilateral;  . NOVASURE ABLATION  10/15/2003   for management of her extreme menorrhagi  . PORTACATH PLACEMENT Right 04/02/2017   Procedure: INSERTION PORT-A-CATH;  Surgeon: [name], MD;  [facility];  Service: General;  Laterality: Right;  . REMOVAL OF BILATERAL TISSUE EXPANDERS WITH PLACEMENT OF BILATERAL BREAST IMPLANTS Bilateral 01/17/2018   Procedure: REMOVAL OF BILATERAL TISSUE EXPANDERS WITH PLACEMENT OF BILATERAL SILICONE BREAST IMPLANTS;  Surgeon: [name], MD;  Location: [place];  Service: Plastics;  Laterality: Bilateral;  . TUBAL LIGATION  09/2000   bilateral    There were no vitals filed for this visit.  Subjective Assessment - 02/19/18 1105  Subjective  no complaints.  Did the roller and the putty.  Has an appointment for getting off the lexapro today.  October 9th sees Dr. Iran Planas     Pertinent History  double mastectomy Oct 08, 2017, with immediated expander placement 2 sentinel lymph nodes on the right, (  estrogen receptor negative breast cancer) chemotherapy from november til april 4  did well, occasional toe and finger numbness.  Pt will not have to have radiation , implant placement 01/17/18    Currently in Pain?  Yes    Pain  Score  1     Pain Location  Chest    Pain Orientation  Left    Pain Descriptors / Indicators  Tightness    Pain Onset  More than a month ago    Pain Frequency  Intermittent    Aggravating Factors   laying on that side    Pain Relieving Factors  avoiding activities         [facility] PT Assessment - 02/19/18 0001      AROM   Left Shoulder Flexion  155 Degrees    Left Shoulder ABduction  170 Degrees                   [facility] Adult PT Treatment/Exercise - 02/19/18 0001      Exercises   Exercises  Shoulder      Lumbar Exercises: Quadruped   Other Quadruped Lumbar Exercises  child's pose 3x20 sec mid/R/L, alternating LE extension with sig cueing needed to minimize lumbar rotation       Shoulder Exercises: Standing   External Rotation  Both;10 reps;Theraband    Theraband Level (Shoulder External Rotation)  Level 2 (Red)    Flexion  Both;10 reps;Theraband    Theraband Level (Shoulder Flexion)  Level 2 (Red)    ABduction  Both;10 reps;Theraband    Theraband Level (Shoulder ABduction)  Level 2 (Red)    Extension  Both;20 reps;Theraband    Theraband Level (Shoulder Extension)  Level 3 (Green)    Row  Both;Theraband;20 reps    Theraband Level (Shoulder Row)  Level 3 (Green)    Other Standing Exercises  Ballin angels back to [place] x 10    Other Standing Exercises  half kneel thoraic rotation x 10 bil with Lt knee up; demonstrated triangle pose in standing for patient.        Manual Therapy   Soft tissue mobilization  to Lt pectoralis and lateral breast in flexion and horizontal abduction positions                PT Short Term Goals - 12/11/17 0926      PT SHORT TERM GOAL #3   Title  Pt will have right and left shoulder abduction > 125 degrees so that she can more easily wash her hair     Status  Achieved        PT Long Term Goals - 02/19/18 1151      PT LONG TERM GOAL #1   Title  ind with advanced HEP    Status  On-going      PT LONG TERM GOAL #2   Title   Pt will improve Lt shoulder flexion back to 154 or greater    Baseline  at 152    Status  Partially Met      PT LONG TERM GOAL #3   Title  Pt will be able to shut the trunk of her car without limitations  Status  Partially Met      PT LONG TERM GOAL #4   Title  Pt will be able to perform quadruped and/or forearm plank position type exercise to return to yoga    Baseline  able to do quadruped    Status  Partially Met            Plan - 02/19/18 1149    Clinical Impression Statement  ROM has improved greatly since evaluation today in flexion and abduction.  Pt will be seeing MD this week about stopping Lexapro and maye that will help with the UE/LE numbness and tingling as she has had this in the past.  her main goal continues to be shoulder mobility Lt>Rt and the ability to hold poses in yoga.      PT Frequency  2x / week    PT Duration  4 weeks    PT Treatment/Interventions  ADLs/Self Care Home Management;Biofeedback;Electrical Stimulation;Moist Heat;Cryotherapy;Therapeutic activities;Therapeutic exercise;Neuromuscular re-education;Patient/family education;Passive range of motion;Scar mobilization;Manual techniques;Dry needling;Taping;Manual lymph drainage    PT Next Visit Plan  STM Lt shoulder complex, pectoralis, and axilla, MLD as needed in axilla.   Progressing patient to return to exercise and being able to do yoga type movements     PT Home Exercise Plan  ([email or web address] from [employer])  Access Code: [email or web address]    Consulted and Agree with Plan of Care  Patient       Patient will benefit from skilled therapeutic intervention in order to improve the following deficits and impairments:  Decreased skin integrity, Increased fascial restricitons, Decreased mobility, Decreased scar mobility, Postural dysfunction, Decreased activity tolerance  Visit Diagnosis: Stiffness of right shoulder joint  Stiffness of left shoulder joint  Abnormal posture  Aftercare following surgery for  neoplasm     Problem List Patient Active Problem List   Diagnosis Date Noted  . Genetic testing 05/02/2017  . Family history of breast cancer 04/22/2017  . Family history of colon cancer 04/22/2017  . Family history of kidney cancer 04/22/2017  . Malignant neoplasm of upper-outer quadrant of right breast in female, estrogen receptor negative ([place]) 03/26/2017  . Chest tightness 03/21/2012    Shan Levans, PT 02/19/2018, 11:53 AM  [place], [place], [address] Phone: [phone]   Fax:  [phone]  Name: Jennifer Beck MRN: [id number] Date of Birth: [birth year]

## 2018-02-20 DIAGNOSIS — Z1211 Encounter for screening for malignant neoplasm of colon: Secondary | ICD-10-CM | POA: Diagnosis not present

## 2018-02-25 ENCOUNTER — Ambulatory Visit: Payer: 59 | Attending: Plastic Surgery

## 2018-02-25 DIAGNOSIS — R293 Abnormal posture: Secondary | ICD-10-CM | POA: Diagnosis not present

## 2018-02-25 DIAGNOSIS — M25612 Stiffness of left shoulder, not elsewhere classified: Secondary | ICD-10-CM | POA: Insufficient documentation

## 2018-02-25 DIAGNOSIS — Z171 Estrogen receptor negative status [ER-]: Secondary | ICD-10-CM | POA: Insufficient documentation

## 2018-02-25 DIAGNOSIS — Z483 Aftercare following surgery for neoplasm: Secondary | ICD-10-CM | POA: Diagnosis present

## 2018-02-25 DIAGNOSIS — C50211 Malignant neoplasm of upper-inner quadrant of right female breast: Secondary | ICD-10-CM | POA: Diagnosis present

## 2018-02-25 DIAGNOSIS — M6281 Muscle weakness (generalized): Secondary | ICD-10-CM | POA: Diagnosis present

## 2018-02-25 DIAGNOSIS — M25611 Stiffness of right shoulder, not elsewhere classified: Secondary | ICD-10-CM | POA: Insufficient documentation

## 2018-02-25 NOTE — Therapy (Signed)
[place], [place], [address] Phone: [phone]   Fax:  [phone]  Physical Therapy Treatment  Patient Details  Name: Jennifer Beck MRN: [id number] Date of Birth: 01/23/1966 Referring Provider (PT): Dr. Iran Planas    Encounter Date: 02/25/2018  PT End of Session - 02/25/18 1008    Visit Number  15    Number of Visits  20    Date for PT Re-Evaluation  03/17/18    PT Start Time  0801    PT Stop Time  6720    PT Time Calculation (min)  46 min    Activity Tolerance  Patient tolerated treatment well    Behavior During Therapy  [facility] for tasks assessed/performed       Past Medical History:  Diagnosis Date  . Arthritis    leg and muscle pains  . Chest pain   . Complication of anesthesia   . Endometriosis   . Family history of breast cancer 04/22/2017  . Family history of colon cancer 04/22/2017  . Family history of kidney cancer 04/22/2017  . Fatigue   . GERD (gastroesophageal reflux disease)   . GI bleed    caused by ischemic colitis following an episode of hypertension  . Heart palpitations   . Hx of echocardiogram    a. echo 9/13:  EF 55-60%, Gr 2 diast dysfn  . Hypercholesterolemia   . Ischemic colitis ([place])   . Myocarditis ([place])   . Pericarditis   . PONV (postoperative nausea and vomiting)   . Restrictive pericarditis 2004  . SOB (shortness of breath)     Past Surgical History:  Procedure Laterality Date  . BREAST RECONSTRUCTION WITH PLACEMENT OF TISSUE EXPANDER AND ALLODERM Bilateral 10/08/2017   Procedure: BREAST RECONSTRUCTION WITH PLACEMENT OF TISSUE EXPANDER AND ALLODERM;  Surgeon: [name], MD;  Location: [place];  Service: Plastics;  Laterality: Bilateral;  . CARDIAC CATHETERIZATION  08/26/2001   EF of 60% --- smooth & normal coronary arteries -- there is a Kobayashi motion defect consistent with posterolateral MI -- she quite possibly had a coronary spasm  -- she may have some pericarditis secondary to her MI   . DILATION AND CURETTAGE OF UTERUS  10/15/2003   Uterine enlargement, menorrhagia  . DILATION AND CURETTAGE OF UTERUS  05/27/2002   Dysfunctional uterine bleeding  . LAPAROSCOPY    . LIPOSUCTION WITH LIPOFILLING Bilateral 01/17/2018   Procedure: LIPOFILLING FROM ABDOMEN TO CHEST;  Surgeon: [name], MD;  Location: [place];  Service: Plastics;  Laterality: Bilateral;  . MASTECTOMY W/ SENTINEL NODE BIOPSY Bilateral 10/08/2017   Procedure: RIGHT MASTECTOMY WITH RIGHT AXILLARY SENTINEL LYMPH NODE BIOPSY AND LEFT PROPHYLACTIC MASTECTOMY;  Surgeon: [name], MD;  Location: [place];  Service: General;  Laterality: Bilateral;  . NOVASURE ABLATION  10/15/2003   for management of her extreme menorrhagi  . PORTACATH PLACEMENT Right 04/02/2017   Procedure: INSERTION PORT-A-CATH;  Surgeon: [name], MD;  Location: [facility];  Service: General;  Laterality: Right;  . REMOVAL OF BILATERAL TISSUE EXPANDERS WITH PLACEMENT OF BILATERAL BREAST IMPLANTS Bilateral 01/17/2018   Procedure: REMOVAL OF BILATERAL TISSUE EXPANDERS WITH PLACEMENT OF BILATERAL SILICONE BREAST IMPLANTS;  Surgeon: [name], MD;  Location: [place];  Service: Plastics;  Laterality: Bilateral;  . TUBAL LIGATION  09/2000   bilateral    There were no vitals filed for this visit.  Subjective Assessment - 02/25/18 0804  Subjective  I can tell I'm getting better as the "knots" under my arm at the scarring are less now. My ROM is still not back to normal though.     Pertinent History  double mastectomy Oct 08, 2017, with immediated expander placement 2 sentinel lymph nodes on the right, (  estrogen receptor negative breast cancer) chemotherapy from november til april 4  did well, occasional toe and finger numbness.  Pt will not have to have radiation , implant placement 01/17/18    Patient Stated Goals  The Lt side is  just tight     Currently in Pain?  No/denies                       [facility] Adult PT Treatment/Exercise - 02/25/18 0001      Shoulder Exercises: Standing   Other Standing Exercises  Downward dog on [place] dropping Lt shoulder for increased stretch 5 reps for 5 sec holds. Then "snow angels" on [place] 5 reps      Shoulder Exercises: Therapy Ball   Flexion  Both;10 reps   forward lean into end of stretch   ABduction  Left;10 reps      Manual Therapy   Manual Therapy  Soft tissue mobilization;Myofascial release;Passive ROM;Scapular mobilization    Soft tissue mobilization  to Lt pectoralis and lateral breast in flexion and abduction    Myofascial Release  To Lt lateral chest at area of tightness and lateral border of implant    Scapular Mobilization  In Rt S/L into protraction and retraction with UE P/ROM    Passive ROM  In Supine into flexion, abduction and D2 to Lt>Rt, also Lt in S/L               PT Short Term Goals - 12/11/17 0926      PT SHORT TERM GOAL #3   Title  Pt will have right and left shoulder abduction > 125 degrees so that she can more easily wash her hair     Status  Achieved        PT Long Term Goals - 02/19/18 1151      PT LONG TERM GOAL #1   Title  ind with advanced HEP    Status  On-going      PT LONG TERM GOAL #2   Title  Pt will improve Lt shoulder flexion back to 154 or greater    Baseline  at 152    Status  Partially Met      PT LONG TERM GOAL #3   Title  Pt will be able to shut the trunk of her car without limitations     Status  Partially Met      PT LONG TERM GOAL #4   Title  Pt will be able to perform quadruped and/or forearm plank position type exercise to return to yoga    Baseline  able to do quadruped    Status  Partially Met            Plan - 02/25/18 1009    Clinical Impression Statement  Focused on manual therapy today in varying positions for end ROM to Lt shoulder and then briefly to Rt after a few stretches  in gym. Pt reported her tightness in shoulders (Lt>Rt) and at Lt axilla felt improved by end of session. She does still have concerns about tingling that comes and goes in in her hands, along with weakness that she feels is  getting worse.     Rehab Potential  Excellent    Clinical Impairments Affecting Rehab Potential  previous chemotherapy     PT Frequency  2x / week    PT Duration  4 weeks    PT Treatment/Interventions  ADLs/Self Care Home Management;Biofeedback;Electrical Stimulation;Moist Heat;Cryotherapy;Therapeutic activities;Therapeutic exercise;Neuromuscular re-education;Patient/family education;Passive range of motion;Scar mobilization;Manual techniques;Dry needling;Taping;Manual lymph drainage    PT Next Visit Plan  STM Lt shoulder complex, pectoralis, and axilla, MLD as needed in axilla.   Progressing patient to return to exercise and being able to do yoga type movements     Consulted and Agree with Plan of Care  Patient       Patient will benefit from skilled therapeutic intervention in order to improve the following deficits and impairments:  Decreased skin integrity, Increased fascial restricitons, Decreased mobility, Decreased scar mobility, Postural dysfunction, Decreased activity tolerance  Visit Diagnosis: Stiffness of right shoulder joint  Stiffness of left shoulder joint  Abnormal posture  Aftercare following surgery for neoplasm     Problem List Patient Active Problem List   Diagnosis Date Noted  . Genetic testing 05/02/2017  . Family history of breast cancer 04/22/2017  . Family history of colon cancer 04/22/2017  . Family history of kidney cancer 04/22/2017  . Malignant neoplasm of upper-outer quadrant of right breast in female, estrogen receptor negative ([place]) 03/26/2017  . Chest tightness 03/21/2012    Otelia Limes, PTA 02/25/2018, 10:13 AM  [place], [place],  [address] Phone: [phone]   Fax:  [phone]  Name: Jennifer Beck MRN: [id number] Date of Birth: [birth year]

## 2018-02-27 ENCOUNTER — Ambulatory Visit: Payer: 59

## 2018-02-27 DIAGNOSIS — M25611 Stiffness of right shoulder, not elsewhere classified: Secondary | ICD-10-CM | POA: Diagnosis not present

## 2018-02-27 DIAGNOSIS — M25612 Stiffness of left shoulder, not elsewhere classified: Secondary | ICD-10-CM

## 2018-02-27 DIAGNOSIS — R293 Abnormal posture: Secondary | ICD-10-CM

## 2018-02-27 DIAGNOSIS — Z483 Aftercare following surgery for neoplasm: Secondary | ICD-10-CM

## 2018-02-27 NOTE — Patient Instructions (Signed)
Over Head Pull: Narrow and Wide Grip   Cancer Rehab [phone]   On back, knees bent, feet flat, band across thighs, elbows straight but relaxed. Pull hands apart (start). Keeping elbows straight, bring arms up and over head, hands toward floor. Keep pull steady on band. Hold momentarily. Return slowly, keeping pull steady, back to start. Then do same with a wider grip on the band (past shoulder width) Repeat _5-10__ times. Band color __green____   Side Pull: Double Arm   On back, knees bent, feet flat. Arms perpendicular to body, shoulder level, elbows straight but relaxed. Pull arms out to sides, elbows straight. Resistance band comes across collarbones, hands toward floor. Hold momentarily. Slowly return to starting position. Repeat _5-10__ times. Band color _green____   Sword   On back, knees bent, feet flat, left hand on left hip, right hand above left. Pull right arm DIAGONALLY (hip to shoulder) across chest. Bring right arm along head toward floor. Hold momentarily. Slowly return to starting position. Repeat _5-10__ times. Do with left arm. Band color _green_____   Shoulder Rotation: Double Arm   On back, knees bent, feet flat, elbows tucked at sides, bent 90, hands palms up. Pull hands apart and down toward floor, keeping elbows near sides. Hold momentarily. Slowly return to starting position. Repeat _5-10__ times. Band color __green____   3 Way Raises:      Starting Position:  Leaning against [place], walk feet a few inches away from the [place] and make tummy tight (tuck hips underneath you) Press back/shoulders/head against [place] as much as possible. Keep thumbs up to ceiling, elbows straight and shoulders relaxed/down throughout.  1. Lift arms in front to shoulder height 2. Lift arms a little wider into a "V" to shoulder height 3. Lift arms out to sides in a "T" to shoulder height  Perform 10 times in each direction. Hold 1-2 lbs to start with and work up to 2-3 sets of  10/day. Perform 3-4 times/week. Increase weight as able, decreasing sets of 10 each time you increase weights, then slowly working your way back up to 2-3 sets each time.    Cancer Rehab [phone]

## 2018-02-27 NOTE — Therapy (Signed)
[place], [place], [address] Phone: [phone]   Fax:  [phone]  Physical Therapy Treatment  Patient Details  Name: Jennifer Beck MRN: [id number] Date of Birth: 1966-05-20 Referring Provider (PT): Dr. Iran Planas    Encounter Date: 02/27/2018  PT End of Session - 02/27/18 0848    Visit Number  16    Number of Visits  20    Date for PT Re-Evaluation  03/17/18    PT Start Time  0801    PT Stop Time  4827    PT Time Calculation (min)  46 min    Activity Tolerance  Patient tolerated treatment well    Behavior During Therapy  [facility] for tasks assessed/performed       Past Medical History:  Diagnosis Date  . Arthritis    leg and muscle pains  . Chest pain   . Complication of anesthesia   . Endometriosis   . Family history of breast cancer 04/22/2017  . Family history of colon cancer 04/22/2017  . Family history of kidney cancer 04/22/2017  . Fatigue   . GERD (gastroesophageal reflux disease)   . GI bleed    caused by ischemic colitis following an episode of hypertension  . Heart palpitations   . Hx of echocardiogram    a. echo 9/13:  EF 55-60%, Gr 2 diast dysfn  . Hypercholesterolemia   . Ischemic colitis ([place])   . Myocarditis ([place])   . Pericarditis   . PONV (postoperative nausea and vomiting)   . Restrictive pericarditis 2004  . SOB (shortness of breath)     Past Surgical History:  Procedure Laterality Date  . BREAST RECONSTRUCTION WITH PLACEMENT OF TISSUE EXPANDER AND ALLODERM Bilateral 10/08/2017   Procedure: BREAST RECONSTRUCTION WITH PLACEMENT OF TISSUE EXPANDER AND ALLODERM;  Surgeon: [name], MD;  Location: [place];  Service: Plastics;  Laterality: Bilateral;  . CARDIAC CATHETERIZATION  08/26/2001   EF of 60% --- smooth & normal coronary arteries -- there is a [place] motion defect consistent with posterolateral MI -- she quite possibly had a coronary spasm  -- she may have some pericarditis secondary to her MI   . DILATION AND CURETTAGE OF UTERUS  10/15/2003   Uterine enlargement, menorrhagia  . DILATION AND CURETTAGE OF UTERUS  05/27/2002   Dysfunctional uterine bleeding  . LAPAROSCOPY    . LIPOSUCTION WITH LIPOFILLING Bilateral 01/17/2018   Procedure: LIPOFILLING FROM ABDOMEN TO CHEST;  Surgeon: [name], MD;  Location: [place];  Service: Plastics;  Laterality: Bilateral;  . MASTECTOMY W/ SENTINEL NODE BIOPSY Bilateral 10/08/2017   Procedure: RIGHT MASTECTOMY WITH RIGHT AXILLARY SENTINEL LYMPH NODE BIOPSY AND LEFT PROPHYLACTIC MASTECTOMY;  Surgeon: [name], MD;  Location: [place];  Service: General;  Laterality: Bilateral;  . NOVASURE ABLATION  10/15/2003   for management of her extreme menorrhagi  . PORTACATH PLACEMENT Right 04/02/2017   Procedure: INSERTION PORT-A-CATH;  Surgeon: [name], MD;  [facility];  Service: General;  Laterality: Right;  . REMOVAL OF BILATERAL TISSUE EXPANDERS WITH PLACEMENT OF BILATERAL BREAST IMPLANTS Bilateral 01/17/2018   Procedure: REMOVAL OF BILATERAL TISSUE EXPANDERS WITH PLACEMENT OF BILATERAL SILICONE BREAST IMPLANTS;  Surgeon: [name], MD;  Location: [place];  Service: Plastics;  Laterality: Bilateral;  . TUBAL LIGATION  09/2000   bilateral    There were no vitals filed for this visit.  Subjective Assessment - 02/27/18 0804  Subjective  The tingling in my arms and hands is the same. I want to talk to a doctor or someone about that (PTA suggested her nurse navigator).    Pertinent History  double mastectomy Oct 08, 2017, with immediated expander placement 2 sentinel lymph nodes on the right, (  estrogen receptor negative breast cancer) chemotherapy from november til april 4  did well, occasional toe and finger numbness.  Pt will not have to have radiation , implant placement 01/17/18    Patient Stated Goals  The Lt side  is just tight     Currently in Pain?  No/denies                       [facility] Adult PT Treatment/Exercise - 02/27/18 0001      Shoulder Exercises: Standing   Other Standing Exercises  Instructed in bil UE 3 way raises 1 lb, 10x each (into flexion, scaption, and abduction to shoulder height); then instructed in supine scapular series all except flexion in supine (narrow and wide grip) 10x each with green theraband      Shoulder Exercises: Therapy Ball   Flexion  Both;10 reps   Forward lean into end of stretch   ABduction  Left;10 reps      Manual Therapy   Manual Therapy  Myofascial release;Passive ROM    Myofascial Release  To Lt lateral chest at area of tightness and lateral border of implant    Passive ROM  In Supine into flexion, abduction and D2 to Lt UE             PT Education - 02/27/18 0853    Education provided  Yes    Education Details  3 way raises and scapular series; also how to safely progress weights while keeping risk of lymphedema low    Person(s) Educated  Patient    Methods  Explanation;Demonstration;Handout    Comprehension  Verbalized understanding;Returned demonstration       PT Short Term Goals - 12/11/17 0926      PT SHORT TERM GOAL #3   Title  Pt will have right and left shoulder abduction > 125 degrees so that she can more easily wash her hair     Status  Achieved        PT Long Term Goals - 02/27/18 0806      PT LONG TERM GOAL #1   Title  ind with advanced HEP    Baseline  Progressed HEP today and pt did well with this-02/27/18    Status  Partially Met      PT LONG TERM GOAL #2   Title  Pt will improve Lt shoulder flexion back to 154 or greater    Baseline  at 152; 160 degrees - 02/27/18    Status  Achieved      PT LONG TERM GOAL #3   Title  Pt will be able to shut the trunk of her car without limitations     Status  Achieved      PT LONG TERM GOAL #4   Title  Pt will be able to perform quadruped and/or forearm plank  position type exercise to return to yoga    Baseline  able to do quadruped; has started some of her yoga positions and able to hold plank on forearms-02/27/18    Status  Achieved            Plan - 02/27/18 0849    Clinical Impression  Statement  Discussed progress thus far with pt and, although her tingling isn't much improved in her UE's, she can tell notable improvement with her end A/ROM of bil shoulders and a decrease of her scar tissue at her Lt lateral implant. She would like to work towards D/C by decreasing freq to 1x/wk for next 2 weeks with potential to D/C next week if she feels independent with her HEP. Progressed her HEP today to include standing 3 way raises and supine and/or standing scapular series which she tolerated all of well.     Rehab Potential  Excellent    Clinical Impairments Affecting Rehab Potential  previous chemotherapy     PT Frequency  2x / week    PT Duration  4 weeks    PT Treatment/Interventions  ADLs/Self Care Home Management;Biofeedback;Electrical Stimulation;Moist Heat;Cryotherapy;Therapeutic activities;Therapeutic exercise;Neuromuscular re-education;Patient/family education;Passive range of motion;Scar mobilization;Manual techniques;Dry needling;Taping;Manual lymph drainage    PT Next Visit Plan  Review HEP at next session and address any questions. Pt ready for D/C next 1-2 visits.     Consulted and Agree with Plan of Care  Patient       Patient will benefit from skilled therapeutic intervention in order to improve the following deficits and impairments:  Decreased skin integrity, Increased fascial restricitons, Decreased mobility, Decreased scar mobility, Postural dysfunction, Decreased activity tolerance  Visit Diagnosis: Stiffness of right shoulder joint  Stiffness of left shoulder joint  Abnormal posture  Aftercare following surgery for neoplasm     Problem List Patient Active Problem List   Diagnosis Date Noted  . Genetic testing  05/02/2017  . Family history of breast cancer 04/22/2017  . Family history of colon cancer 04/22/2017  . Family history of kidney cancer 04/22/2017  . Malignant neoplasm of upper-outer quadrant of right breast in female, estrogen receptor negative ([place]) 03/26/2017  . Chest tightness 03/21/2012    Otelia Limes, PTA 02/27/2018, 8:54 AM  [place], [place], [address] Phone: [phone]   Fax:  [phone]  Name: Jennifer Beck MRN: [id number] Date of Birth: 11-17-1965

## 2018-02-28 DIAGNOSIS — D045 Carcinoma in situ of skin of trunk: Secondary | ICD-10-CM | POA: Diagnosis not present

## 2018-03-03 ENCOUNTER — Other Ambulatory Visit: Payer: Self-pay | Admitting: Oncology

## 2018-03-03 ENCOUNTER — Encounter: Payer: 59 | Admitting: Rehabilitation

## 2018-03-05 ENCOUNTER — Ambulatory Visit: Payer: 59

## 2018-03-05 DIAGNOSIS — M25612 Stiffness of left shoulder, not elsewhere classified: Secondary | ICD-10-CM

## 2018-03-05 DIAGNOSIS — M25611 Stiffness of right shoulder, not elsewhere classified: Secondary | ICD-10-CM | POA: Diagnosis not present

## 2018-03-05 DIAGNOSIS — Z171 Estrogen receptor negative status [ER-]: Secondary | ICD-10-CM

## 2018-03-05 DIAGNOSIS — Z483 Aftercare following surgery for neoplasm: Secondary | ICD-10-CM

## 2018-03-05 DIAGNOSIS — C50211 Malignant neoplasm of upper-inner quadrant of right female breast: Secondary | ICD-10-CM

## 2018-03-05 DIAGNOSIS — R293 Abnormal posture: Secondary | ICD-10-CM

## 2018-03-05 NOTE — Therapy (Signed)
[place], [place], [address] Phone: [phone]   Fax:  [phone]  Physical Therapy Treatment  Patient Details  Name: Jennifer Beck MRN: [id number] Date of Birth: 08-12-1965 Referring Provider (PT): Dr. Iran Planas    Encounter Date: 03/05/2018  PT End of Session - 03/05/18 1322    Visit Number  17    Number of Visits  20    Date for PT Re-Evaluation  03/17/18    PT Start Time  0804    PT Stop Time  0850    PT Time Calculation (min)  46 min    Activity Tolerance  Patient tolerated treatment well    Behavior During Therapy  [facility] for tasks assessed/performed       Past Medical History:  Diagnosis Date  . Arthritis    leg and muscle pains  . Chest pain   . Complication of anesthesia   . Endometriosis   . Family history of breast cancer 04/22/2017  . Family history of colon cancer 04/22/2017  . Family history of kidney cancer 04/22/2017  . Fatigue   . GERD (gastroesophageal reflux disease)   . GI bleed    caused by ischemic colitis following an episode of hypertension  . Heart palpitations   . Hx of echocardiogram    a. echo 9/13:  EF 55-60%, Gr 2 diast dysfn  . Hypercholesterolemia   . Ischemic colitis ([place])   . Myocarditis ([place])   . Pericarditis   . PONV (postoperative nausea and vomiting)   . Restrictive pericarditis 2004  . SOB (shortness of breath)     Past Surgical History:  Procedure Laterality Date  . BREAST RECONSTRUCTION WITH PLACEMENT OF TISSUE EXPANDER AND ALLODERM Bilateral 10/08/2017   Procedure: BREAST RECONSTRUCTION WITH PLACEMENT OF TISSUE EXPANDER AND ALLODERM;  Surgeon: [name], MD;  Location: [place];  Service: Plastics;  Laterality: Bilateral;  . CARDIAC CATHETERIZATION  08/26/2001   EF of 60% --- smooth & normal coronary arteries -- there is a [place] motion defect consistent with posterolateral MI -- she quite possibly had a coronary spasm  -- she may have some pericarditis secondary to her MI   . DILATION AND CURETTAGE OF UTERUS  10/15/2003   Uterine enlargement, menorrhagia  . DILATION AND CURETTAGE OF UTERUS  05/27/2002   Dysfunctional uterine bleeding  . LAPAROSCOPY    . LIPOSUCTION WITH LIPOFILLING Bilateral 01/17/2018   Procedure: LIPOFILLING FROM ABDOMEN TO CHEST;  Surgeon: [name], MD;  Location: [place];  Service: Plastics;  Laterality: Bilateral;  . MASTECTOMY W/ SENTINEL NODE BIOPSY Bilateral 10/08/2017   Procedure: RIGHT MASTECTOMY WITH RIGHT AXILLARY SENTINEL LYMPH NODE BIOPSY AND LEFT PROPHYLACTIC MASTECTOMY;  Surgeon: [name], MD;  Location: [place];  Service: General;  Laterality: Bilateral;  . NOVASURE ABLATION  10/15/2003   for management of her extreme menorrhagi  . PORTACATH PLACEMENT Right 04/02/2017   Procedure: INSERTION PORT-A-CATH;  Surgeon: [name], MD;  Location: [facility];  Service: General;  Laterality: Right;  . REMOVAL OF BILATERAL TISSUE EXPANDERS WITH PLACEMENT OF BILATERAL BREAST IMPLANTS Bilateral 01/17/2018   Procedure: REMOVAL OF BILATERAL TISSUE EXPANDERS WITH PLACEMENT OF BILATERAL SILICONE BREAST IMPLANTS;  Surgeon: [name], MD;  Location: [place];  Service: Plastics;  Laterality: Bilateral;  . TUBAL LIGATION  09/2000   bilateral    There were no vitals filed for this visit.  Subjective Assessment - 03/05/18 0815  Subjective  I'm feeling good. The stretching was good last visit and I'm ready to learn the strengthening we talked about last time.     Pertinent History  double mastectomy Oct 08, 2017, with immediated expander placement 2 sentinel lymph nodes on the right, (  estrogen receptor negative breast cancer) chemotherapy from november til april 4  did well, occasional toe and finger numbness.  Pt will not have to have radiation , implant placement 01/17/18    Patient Stated Goals  The Lt side is just  tight     Currently in Pain?  No/denies                       [facility] Adult PT Treatment/Exercise - 03/05/18 0001      Exercises   Other Exercises   Instructed pt in Strength ABC Program in it's entirety with demonstration of each and correcting pts technique prn, especially with dead lifts and one arm row, no weights used. All stretches done 1 rep, 15 sec holds (replaced butterfly stretch for piriformis figure 4 seated in chair), strength 10 times each (instructed how to replace squats for Veasey slides as they were less strenuous on her knees)             PT Education - 03/05/18 1322    Education provided  Yes    Education Details  Strength ABC Program    Person(s) Educated  Patient    Methods  Explanation;Demonstration;Handout    Comprehension  Verbalized understanding;Returned demonstration;Need further instruction       PT Short Term Goals - 12/11/17 0926      PT SHORT TERM GOAL #3   Title  Pt will have right and left shoulder abduction > 125 degrees so that she can more easily wash her hair     Status  Achieved        PT Long Term Goals - 02/27/18 0806      PT LONG TERM GOAL #1   Title  ind with advanced HEP    Baseline  Progressed HEP today and pt did well with this-02/27/18    Status  Partially Met      PT LONG TERM GOAL #2   Title  Pt will improve Lt shoulder flexion back to 154 or greater    Baseline  at 152; 160 degrees - 02/27/18    Status  Achieved      PT LONG TERM GOAL #3   Title  Pt will be able to shut the trunk of her car without limitations     Status  Achieved      PT LONG TERM GOAL #4   Title  Pt will be able to perform quadruped and/or forearm plank position type exercise to return to yoga    Baseline  able to do quadruped; has started some of her yoga positions and able to hold plank on forearms-02/27/18    Status  Achieved            Plan - 03/05/18 1323    Clinical Impression Statement  Instructed pt today in Strength  ABC Program which she tolerated very well though did struggle with correct technique of a few exs (see flowhseet). She has one more visit next week and plans to D/C if she is doing well with all HEP and has met goals.     Rehab Potential  Excellent    Clinical Impairments Affecting Rehab Potential  previous chemotherapy  PT Frequency  2x / week    PT Duration  4 weeks    PT Treatment/Interventions  ADLs/Self Care Home Management;Biofeedback;Electrical Stimulation;Moist Heat;Cryotherapy;Therapeutic activities;Therapeutic exercise;Neuromuscular re-education;Patient/family education;Passive range of motion;Scar mobilization;Manual techniques;Dry needling;Taping;Manual lymph drainage    PT Next Visit Plan  Review HEP at next session and address any questions. Pt ready for D/C next visit?     Consulted and Agree with Plan of Care  Patient       Patient will benefit from skilled therapeutic intervention in order to improve the following deficits and impairments:  Decreased skin integrity, Increased fascial restricitons, Decreased mobility, Decreased scar mobility, Postural dysfunction, Decreased activity tolerance  Visit Diagnosis: Stiffness of right shoulder joint  Stiffness of left shoulder joint  Abnormal posture  Aftercare following surgery for neoplasm  Malignant neoplasm of upper-inner quadrant of right breast in female, estrogen receptor negative ([place])     Problem List Patient Active Problem List   Diagnosis Date Noted  . Genetic testing 05/02/2017  . Family history of breast cancer 04/22/2017  . Family history of colon cancer 04/22/2017  . Family history of kidney cancer 04/22/2017  . Malignant neoplasm of upper-outer quadrant of right breast in female, estrogen receptor negative ([place]) 03/26/2017  . Chest tightness 03/21/2012    Otelia Limes, PTA 03/05/2018, 1:35 PM  [place], [place], [address] Phone: [phone]   Fax:  [phone]  Name: Jennifer Beck MRN: [id number] Date of Birth: [birth year]

## 2018-03-10 ENCOUNTER — Encounter: Payer: Self-pay | Admitting: Rehabilitation

## 2018-03-10 ENCOUNTER — Ambulatory Visit: Payer: 59 | Admitting: Rehabilitation

## 2018-03-10 DIAGNOSIS — M25611 Stiffness of right shoulder, not elsewhere classified: Secondary | ICD-10-CM | POA: Diagnosis not present

## 2018-03-10 DIAGNOSIS — Z483 Aftercare following surgery for neoplasm: Secondary | ICD-10-CM

## 2018-03-10 DIAGNOSIS — M25612 Stiffness of left shoulder, not elsewhere classified: Secondary | ICD-10-CM

## 2018-03-10 DIAGNOSIS — Z171 Estrogen receptor negative status [ER-]: Secondary | ICD-10-CM

## 2018-03-10 DIAGNOSIS — R293 Abnormal posture: Secondary | ICD-10-CM

## 2018-03-10 DIAGNOSIS — C50211 Malignant neoplasm of upper-inner quadrant of right female breast: Secondary | ICD-10-CM

## 2018-03-10 DIAGNOSIS — M6281 Muscle weakness (generalized): Secondary | ICD-10-CM

## 2018-03-10 NOTE — Therapy (Addendum)
[place], [place], [address] Phone: [phone]   Fax:  [phone]  Physical Therapy Treatment  Patient Details  Name: Jennifer Beck MRN: [id number] Date of Birth: [birth year] Referring Provider (PT): Dr. Iran Planas    Encounter Date: 03/10/2018  PT End of Session - 03/10/18 0814    Visit Number  18    Number of Visits  20    Date for PT Re-Evaluation  03/17/18    PT Start Time  0802    PT Stop Time  0844    PT Time Calculation (min)  42 min    Activity Tolerance  Patient tolerated treatment well    Behavior During Therapy  [facility] for tasks assessed/performed       Past Medical History:  Diagnosis Date  . Arthritis    leg and muscle pains  . Chest pain   . Complication of anesthesia   . Endometriosis   . Family history of breast cancer 04/22/2017  . Family history of colon cancer 04/22/2017  . Family history of kidney cancer 04/22/2017  . Fatigue   . GERD (gastroesophageal reflux disease)   . GI bleed    caused by ischemic colitis following an episode of hypertension  . Heart palpitations   . Hx of echocardiogram    a. echo 9/13:  EF 55-60%, Gr 2 diast dysfn  . Hypercholesterolemia   . Ischemic colitis ([place])   . Myocarditis ([place])   . Pericarditis   . PONV (postoperative nausea and vomiting)   . Restrictive pericarditis 2004  . SOB (shortness of breath)     Past Surgical History:  Procedure Laterality Date  . BREAST RECONSTRUCTION WITH PLACEMENT OF TISSUE EXPANDER AND ALLODERM Bilateral 10/08/2017   Procedure: BREAST RECONSTRUCTION WITH PLACEMENT OF TISSUE EXPANDER AND ALLODERM;  Surgeon: [name], MD;  Location: [place];  Service: Plastics;  Laterality: Bilateral;  . CARDIAC CATHETERIZATION  08/26/2001   EF of 60% --- smooth & normal coronary arteries -- there is a [place] motion defect consistent with posterolateral MI -- she quite possibly had a coronary  spasm -- she may have some pericarditis secondary to her MI   . DILATION AND CURETTAGE OF UTERUS  10/15/2003   Uterine enlargement, menorrhagia  . DILATION AND CURETTAGE OF UTERUS  05/27/2002   Dysfunctional uterine bleeding  . LAPAROSCOPY    . LIPOSUCTION WITH LIPOFILLING Bilateral 01/17/2018   Procedure: LIPOFILLING FROM ABDOMEN TO CHEST;  Surgeon: [name], MD;  Location: [place];  Service: Plastics;  Laterality: Bilateral;  . MASTECTOMY W/ SENTINEL NODE BIOPSY Bilateral 10/08/2017   Procedure: RIGHT MASTECTOMY WITH RIGHT AXILLARY SENTINEL LYMPH NODE BIOPSY AND LEFT PROPHYLACTIC MASTECTOMY;  Surgeon: [name], MD;  Location: [place];  Service: General;  Laterality: Bilateral;  . NOVASURE ABLATION  10/15/2003   for management of her extreme menorrhagi  . PORTACATH PLACEMENT Right 04/02/2017   Procedure: INSERTION PORT-A-CATH;  Surgeon: [name], MD;  [facility];  Service: General;  Laterality: Right;  . REMOVAL OF BILATERAL TISSUE EXPANDERS WITH PLACEMENT OF BILATERAL BREAST IMPLANTS Bilateral 01/17/2018   Procedure: REMOVAL OF BILATERAL TISSUE EXPANDERS WITH PLACEMENT OF BILATERAL SILICONE BREAST IMPLANTS;  Surgeon: [name], MD;  Location: [place];  Service: Plastics;  Laterality: Bilateral;  . TUBAL LIGATION  09/2000   bilateral    There were no vitals filed for this visit.  Subjective Assessment - 03/10/18 0804  Subjective  Can you check my grip strength.  Dr. Iran Planas thinks the buldge is from from the fat grafting     Pertinent History  double mastectomy Oct 08, 2017, with immediated expander placement 2 sentinel lymph nodes on the right, (  estrogen receptor negative breast cancer) chemotherapy from november til april 4  did well, occasional toe and finger numbness.  Pt will not have to have radiation , implant placement 01/17/18    Patient Stated Goals  The Lt side is just tight     Currently in  Pain?  No/denies         [facility] PT Assessment - 03/10/18 0001      PROM   Overall PROM Comments  flex: 169, Abd: WNL, ER: 5deg from the table      Strength   Overall Strength Comments  Grip R: 50pounds, L: 41 pounds                   [facility] Adult PT Treatment/Exercise - 03/10/18 0001      Self-Care   Other Self-Care Comments   rechecked grip strength, discussed chiropractic care and given name for [facility] in case she needs to resume care       Shoulder Exercises: Standing   Flexion  Both;Weights;20 reps    Shoulder Flexion Weight (lbs)  2    Extension  Both;20 reps;Theraband    Theraband Level (Shoulder Extension)  Level 3 (Green)    Row  [employer];Theraband    Theraband Level (Shoulder Row)  Level 3 (Green)    Other Standing Exercises  scaption and abduction  2# 2x10 bilateral, overhead press 2# 2x10     Other Standing Exercises  bicep curls 4# 2x10       Manual Therapy   Passive ROM  In supine all direcitons to tolerance with measurements taken               PT Short Term Goals - 12/11/17 0926      PT SHORT TERM GOAL #3   Title  Pt will have right and left shoulder abduction > 125 degrees so that she can more easily wash her hair     Status  Achieved        PT Long Term Goals - 03/10/18 0815      PT LONG TERM GOAL #1   Title  ind with advanced HEP    Status  Partially Met      PT LONG TERM GOAL #2   Title  Pt will improve Lt shoulder flexion back to 154 or greater    Status  Achieved      PT LONG TERM GOAL #3   Title  Pt will be able to shut the trunk of her car without limitations     Status  Achieved      PT LONG TERM GOAL #4   Title  Pt will be able to perform quadruped and/or forearm plank position type exercise to return to yoga    Status  Achieved            Plan - 03/10/18 0832    Clinical Impression Statement  Pt having met most goals today and with excellent status of hte shoulder ROM and knowledge  of the strengthening for home.  She has 2 visits remaining for the year and we will check in about 2-3 weeks to see if she is maintaining her ROM and doing well at home  Clinical Impairments Affecting Rehab Potential  previous chemotherapy     PT Frequency  2x / week    PT Duration  4 weeks    PT Treatment/Interventions  ADLs/Self Care Home Management;Biofeedback;Electrical Stimulation;Moist Heat;Cryotherapy;Therapeutic activities;Therapeutic exercise;Neuromuscular re-education;Patient/family education;Passive range of motion;Scar mobilization;Manual techniques;Dry needling;Taping;Manual lymph drainage    PT Next Visit Plan  check status of ROM and finalize HEP in next session    PT [place]  ([email or web address] from [place])  Access Code: [email or web address]    Consulted and Agree with Plan of Care  Patient       Patient will benefit from skilled therapeutic intervention in order to improve the following deficits and impairments:  Decreased skin integrity, Increased fascial restricitons, Decreased mobility, Decreased scar mobility, Postural dysfunction, Decreased activity tolerance  Visit Diagnosis: Stiffness of right shoulder joint  Stiffness of left shoulder joint  Abnormal posture  Aftercare following surgery for neoplasm  Malignant neoplasm of upper-inner quadrant of right breast in female, estrogen receptor negative ([facility])  Muscle weakness (generalized)     Problem List Patient Active Problem List   Diagnosis Date Noted  . Genetic testing 05/02/2017  . Family history of breast cancer 04/22/2017  . Family history of colon cancer 04/22/2017  . Family history of kidney cancer 04/22/2017  . Malignant neoplasm of upper-outer quadrant of right breast in female, estrogen receptor negative ([place]) 03/26/2017  . Chest tightness 03/21/2012    Shan Levans, PT 03/10/2018, 8:45 AM  [place], [place],  [address] Phone: [phone]   Fax:  [phone]  Name: Jennifer Beck MRN: [id number] Date of Birth: [birth year]   PHYSICAL THERAPY DISCHARGE SUMMARY  Visits from Start of Care: 18  Current functional level related to goals / functional outcomes: Pt has met all goals.  Instead of coming in for her follow up she called and said she is doing well at the chiropractor and with her home exercises.     Remaining deficits: Need for continued ROM and strengthening at home   Education / Equipment: HEP Plan: Patient agrees to discharge.  Patient goals were met. Patient is being discharged due to meeting the stated rehab goals.  ?????    Shan Levans, PT 03/31/18

## 2018-03-11 ENCOUNTER — Other Ambulatory Visit: Payer: Self-pay | Admitting: Oncology

## 2018-03-12 ENCOUNTER — Encounter: Payer: 59 | Admitting: Rehabilitation

## 2018-03-18 DIAGNOSIS — M9902 Segmental and somatic dysfunction of thoracic region: Secondary | ICD-10-CM | POA: Diagnosis not present

## 2018-03-18 DIAGNOSIS — M9901 Segmental and somatic dysfunction of cervical region: Secondary | ICD-10-CM | POA: Diagnosis not present

## 2018-03-18 DIAGNOSIS — G5603 Carpal tunnel syndrome, bilateral upper limbs: Secondary | ICD-10-CM | POA: Diagnosis not present

## 2018-03-20 DIAGNOSIS — M9901 Segmental and somatic dysfunction of cervical region: Secondary | ICD-10-CM | POA: Diagnosis not present

## 2018-03-20 DIAGNOSIS — G5603 Carpal tunnel syndrome, bilateral upper limbs: Secondary | ICD-10-CM | POA: Diagnosis not present

## 2018-03-20 DIAGNOSIS — M9902 Segmental and somatic dysfunction of thoracic region: Secondary | ICD-10-CM | POA: Diagnosis not present

## 2018-03-24 DIAGNOSIS — M9901 Segmental and somatic dysfunction of cervical region: Secondary | ICD-10-CM | POA: Diagnosis not present

## 2018-03-24 DIAGNOSIS — M9902 Segmental and somatic dysfunction of thoracic region: Secondary | ICD-10-CM | POA: Diagnosis not present

## 2018-03-24 DIAGNOSIS — G5603 Carpal tunnel syndrome, bilateral upper limbs: Secondary | ICD-10-CM | POA: Diagnosis not present

## 2018-03-26 DIAGNOSIS — G5603 Carpal tunnel syndrome, bilateral upper limbs: Secondary | ICD-10-CM | POA: Diagnosis not present

## 2018-03-26 DIAGNOSIS — M9901 Segmental and somatic dysfunction of cervical region: Secondary | ICD-10-CM | POA: Diagnosis not present

## 2018-03-26 DIAGNOSIS — M9902 Segmental and somatic dysfunction of thoracic region: Secondary | ICD-10-CM | POA: Diagnosis not present

## 2018-03-28 DIAGNOSIS — H66002 Acute suppurative otitis media without spontaneous rupture of ear drum, left ear: Secondary | ICD-10-CM | POA: Diagnosis not present

## 2018-03-31 ENCOUNTER — Ambulatory Visit: Payer: 59 | Admitting: Rehabilitation

## 2018-03-31 DIAGNOSIS — M9901 Segmental and somatic dysfunction of cervical region: Secondary | ICD-10-CM | POA: Diagnosis not present

## 2018-03-31 DIAGNOSIS — G5603 Carpal tunnel syndrome, bilateral upper limbs: Secondary | ICD-10-CM | POA: Diagnosis not present

## 2018-03-31 DIAGNOSIS — M9902 Segmental and somatic dysfunction of thoracic region: Secondary | ICD-10-CM | POA: Diagnosis not present

## 2018-04-03 ENCOUNTER — Other Ambulatory Visit: Payer: Self-pay | Admitting: Oncology

## 2018-04-04 DIAGNOSIS — G5603 Carpal tunnel syndrome, bilateral upper limbs: Secondary | ICD-10-CM | POA: Diagnosis not present

## 2018-04-04 DIAGNOSIS — M9901 Segmental and somatic dysfunction of cervical region: Secondary | ICD-10-CM | POA: Diagnosis not present

## 2018-04-04 DIAGNOSIS — M9902 Segmental and somatic dysfunction of thoracic region: Secondary | ICD-10-CM | POA: Diagnosis not present

## 2018-04-07 DIAGNOSIS — M9902 Segmental and somatic dysfunction of thoracic region: Secondary | ICD-10-CM | POA: Diagnosis not present

## 2018-04-07 DIAGNOSIS — G5603 Carpal tunnel syndrome, bilateral upper limbs: Secondary | ICD-10-CM | POA: Diagnosis not present

## 2018-04-07 DIAGNOSIS — M9901 Segmental and somatic dysfunction of cervical region: Secondary | ICD-10-CM | POA: Diagnosis not present

## 2018-04-10 DIAGNOSIS — Z85828 Personal history of other malignant neoplasm of skin: Secondary | ICD-10-CM | POA: Diagnosis not present

## 2018-04-10 DIAGNOSIS — L603 Nail dystrophy: Secondary | ICD-10-CM | POA: Diagnosis not present

## 2018-04-11 DIAGNOSIS — M9902 Segmental and somatic dysfunction of thoracic region: Secondary | ICD-10-CM | POA: Diagnosis not present

## 2018-04-11 DIAGNOSIS — G5603 Carpal tunnel syndrome, bilateral upper limbs: Secondary | ICD-10-CM | POA: Diagnosis not present

## 2018-04-11 DIAGNOSIS — M9901 Segmental and somatic dysfunction of cervical region: Secondary | ICD-10-CM | POA: Diagnosis not present

## 2018-04-14 ENCOUNTER — Other Ambulatory Visit: Payer: Self-pay | Admitting: Plastic Surgery

## 2018-04-14 DIAGNOSIS — N6320 Unspecified lump in the left breast, unspecified quadrant: Secondary | ICD-10-CM

## 2018-04-15 DIAGNOSIS — M9902 Segmental and somatic dysfunction of thoracic region: Secondary | ICD-10-CM | POA: Diagnosis not present

## 2018-04-15 DIAGNOSIS — M9901 Segmental and somatic dysfunction of cervical region: Secondary | ICD-10-CM | POA: Diagnosis not present

## 2018-04-15 DIAGNOSIS — G5603 Carpal tunnel syndrome, bilateral upper limbs: Secondary | ICD-10-CM | POA: Diagnosis not present

## 2018-04-17 ENCOUNTER — Ambulatory Visit
Admission: RE | Admit: 2018-04-17 | Discharge: 2018-04-17 | Disposition: A | Discharge status: Home / Self Care | Payer: 59 | Source: Ambulatory Visit | Attending: Plastic Surgery | Admitting: Plastic Surgery

## 2018-04-17 DIAGNOSIS — N6320 Unspecified lump in the left breast, unspecified quadrant: Secondary | ICD-10-CM

## 2018-04-17 DIAGNOSIS — N6489 Other specified disorders of breast: Secondary | ICD-10-CM | POA: Diagnosis not present

## 2018-04-18 DIAGNOSIS — G5603 Carpal tunnel syndrome, bilateral upper limbs: Secondary | ICD-10-CM | POA: Diagnosis not present

## 2018-04-18 DIAGNOSIS — M9902 Segmental and somatic dysfunction of thoracic region: Secondary | ICD-10-CM | POA: Diagnosis not present

## 2018-04-18 DIAGNOSIS — M9901 Segmental and somatic dysfunction of cervical region: Secondary | ICD-10-CM | POA: Diagnosis not present

## 2018-04-22 DIAGNOSIS — M9902 Segmental and somatic dysfunction of thoracic region: Secondary | ICD-10-CM | POA: Diagnosis not present

## 2018-04-22 DIAGNOSIS — G5603 Carpal tunnel syndrome, bilateral upper limbs: Secondary | ICD-10-CM | POA: Diagnosis not present

## 2018-04-22 DIAGNOSIS — M9901 Segmental and somatic dysfunction of cervical region: Secondary | ICD-10-CM | POA: Diagnosis not present

## 2018-04-29 DIAGNOSIS — R200 Anesthesia of skin: Secondary | ICD-10-CM | POA: Diagnosis not present

## 2018-04-29 DIAGNOSIS — N951 Menopausal and female climacteric states: Secondary | ICD-10-CM | POA: Diagnosis not present

## 2018-04-30 DIAGNOSIS — M9902 Segmental and somatic dysfunction of thoracic region: Secondary | ICD-10-CM | POA: Diagnosis not present

## 2018-04-30 DIAGNOSIS — G5603 Carpal tunnel syndrome, bilateral upper limbs: Secondary | ICD-10-CM | POA: Diagnosis not present

## 2018-04-30 DIAGNOSIS — M9901 Segmental and somatic dysfunction of cervical region: Secondary | ICD-10-CM | POA: Diagnosis not present

## 2018-05-05 DIAGNOSIS — D123 Benign neoplasm of transverse colon: Secondary | ICD-10-CM | POA: Diagnosis not present

## 2018-05-05 DIAGNOSIS — K648 Other hemorrhoids: Secondary | ICD-10-CM | POA: Diagnosis not present

## 2018-05-05 DIAGNOSIS — Z1211 Encounter for screening for malignant neoplasm of colon: Secondary | ICD-10-CM | POA: Diagnosis not present

## 2018-05-07 DIAGNOSIS — M79642 Pain in left hand: Secondary | ICD-10-CM | POA: Diagnosis not present

## 2018-05-07 DIAGNOSIS — M79641 Pain in right hand: Secondary | ICD-10-CM | POA: Diagnosis not present

## 2018-05-07 DIAGNOSIS — G5603 Carpal tunnel syndrome, bilateral upper limbs: Secondary | ICD-10-CM | POA: Diagnosis not present

## 2018-05-07 DIAGNOSIS — C50911 Malignant neoplasm of unspecified site of right female breast: Secondary | ICD-10-CM | POA: Diagnosis not present

## 2018-05-07 DIAGNOSIS — N6092 Unspecified benign mammary dysplasia of left breast: Secondary | ICD-10-CM | POA: Diagnosis not present

## 2018-05-08 ENCOUNTER — Other Ambulatory Visit: Payer: Self-pay | Admitting: Oncology

## 2018-05-08 DIAGNOSIS — M9901 Segmental and somatic dysfunction of cervical region: Secondary | ICD-10-CM | POA: Diagnosis not present

## 2018-05-08 DIAGNOSIS — M9902 Segmental and somatic dysfunction of thoracic region: Secondary | ICD-10-CM | POA: Diagnosis not present

## 2018-05-08 DIAGNOSIS — G5603 Carpal tunnel syndrome, bilateral upper limbs: Secondary | ICD-10-CM | POA: Diagnosis not present

## 2018-05-11 NOTE — Progress Notes (Signed)
[place]  [phone] [phone]     ID: Jennifer Beck DOB: March 23, 1966  MR#: [id number]  WGN#:[id number]  Patient Care Team: [name], MD as PCP - General (Internal Medicine) [name], MD as Consulting Physician (Dermatology) [name], [place] as Referring Physician (Chiropractic Medicine) [name], MD as Consulting Physician (General Surgery) [name], MD as Attending Physician (Radiation Oncology) [name], MD as Consulting Physician (Obstetrics and Gynecology) Avneet Ashmore, Virgie Dad, MD as Consulting Physician (Oncology) [name], MD as Consulting Physician (Plastic Surgery) OTHER MD:  CHIEF COMPLAINT: Triple negative breast cancer  CURRENT TREATMENT: Observation  INTERVAL HISTORY: Remee returns today for follow-up of her triple negative breast cancer.   The patient continues under observation.   Since her last visit here, she underwent an ultrasound of the left axilla on 04/17/2018. On physical exam,there is a firm mobile mass in the anterior inferior left axilla. Ultrasound of the palpable site in the left axilla demonstrates both an echogenic oval mass measuring up to 1.6 cm, within an associated adjacent anechoic superficial mass. These findings are compatible with benign fat necrosis.   REVIEW OF SYSTEMS:  Avanni is doing well overall. She is experiencing hot flashes, with relief from a soy based medicine. She is also experiencing some vaginal dryness, with relief from medication. She does not have a formal exercise program, however she is on her feet a lot for work and is averaging about 10,000 steps per day. For Thanksgiving, she went to her aunt-in-law's house. The patient denies unusual headaches, visual changes, nausea, vomiting, or dizziness. There has been no unusual cough, phlegm production, or pleurisy. This been no change in bowel or bladder habits. The patient denies  unexplained fatigue or unexplained weight loss, bleeding, rash, or fever. A detailed review of systems was otherwise noncontributory.    HISTORY OF CURRENT ILLNESS:  From the original intake note:  Jennine palpated a mass in her right breast sometime in September 2018.[name] She brought this to medical attention and her PCP, [name], set her up on 03/18/2017 for a unilateral right diagnostic mammography with ultrasonography, showing: Breast density D. The palpable right breast mass at 12:30 was indeterminate, and biopsy was warranted at that time. No evidence of right axillary lymphadenopathy was noted. Biopsy of the lesion in question on 03/21/2017 at the right breast 12:30 position showed ([id number])  invasive ductal carcinoma with lymphovascular invasion.  The tumor was grade 3, triple negative, with an MIB-1 of 80%.  She is accompanied by her husband, [name] to the office today. She reports that she is doing well overall. She initially felt a lump in September in the shower and notes that she doesn't complete monthly self breast exams. She had a routine mammogram in March 2018 at [facility] and she is followed by Dr. [name].  As far as surgeries, she has had two laparoscopic procedures for endometriosis as well as a bilateral tubal ligation. She still has occasional cramping, painful ovulation, and vaginal spotting that comes and goes. She has had cyst to her breast before that have been evaluated and ruled as negative. She notes that she still has her tonsils, gallbladder, and appendix. She has a prior hx of pericarditis in 2003 that she notes was brought onby a virus. She has since been cleared by her prior Cardiologist.   She has a Restaurant manager, fast food, [name] that she sees every 5 weeks for maintenance of her neck and back  issues. She has a dermatologist, [name] at [facility] and she has had an biopsy of an area from her left chest that resulted as  basal cell carcinoma in 2015. She denies having a GI specialist, Cardiologist, or Orthopedist at this time. She denies prior history of seizures, migraines, acid reflux, asthma, emphysema, palpitations or GI issues.   [name] is as detailed below.  PAST MEDICAL HISTORY: Past Medical History:  Diagnosis Date  . Arthritis    leg and muscle pains  . Chest pain   . Complication of anesthesia   . Endometriosis   . Family history of breast cancer 04/22/2017  . Family history of colon cancer 04/22/2017  . Family history of kidney cancer 04/22/2017  . Fatigue   . GERD (gastroesophageal reflux disease)   . GI bleed    caused by ischemic colitis following an episode of hypertension  . Heart palpitations   . Hx of echocardiogram    a. echo 9/13:  EF 55-60%, Gr 2 diast dysfn  . Hypercholesterolemia   . Ischemic colitis ([place])   . Myocarditis ([place])   . Pericarditis   . PONV (postoperative nausea and vomiting)   . Restrictive pericarditis 2004  . SOB (shortness of breath)     PAST SURGICAL HISTORY: Past Surgical History:  Procedure Laterality Date  . BREAST RECONSTRUCTION WITH PLACEMENT OF TISSUE EXPANDER AND ALLODERM Bilateral 10/08/2017   Procedure: BREAST RECONSTRUCTION WITH PLACEMENT OF TISSUE EXPANDER AND ALLODERM;  Surgeon: [name], MD;  Location: [place];  Service: Plastics;  Laterality: Bilateral;  . CARDIAC CATHETERIZATION  08/26/2001   EF of 60% --- smooth & normal coronary arteries -- there is a Hippe motion defect consistent with posterolateral MI -- she quite possibly had a coronary spasm -- she may have some pericarditis secondary to her MI   . DILATION AND CURETTAGE OF UTERUS  10/15/2003   Uterine enlargement, menorrhagia  . DILATION AND CURETTAGE OF UTERUS  05/27/2002   Dysfunctional uterine bleeding  . LAPAROSCOPY    . LIPOSUCTION WITH LIPOFILLING Bilateral 01/17/2018   Procedure: LIPOFILLING FROM ABDOMEN TO CHEST;   Surgeon: [name], MD;  Location: [place];  Service: Plastics;  Laterality: Bilateral;  . MASTECTOMY W/ SENTINEL NODE BIOPSY Bilateral 10/08/2017   Procedure: RIGHT MASTECTOMY WITH RIGHT AXILLARY SENTINEL LYMPH NODE BIOPSY AND LEFT PROPHYLACTIC MASTECTOMY;  Surgeon: [name], MD;  Location: [place];  Service: General;  Laterality: Bilateral;  . NOVASURE ABLATION  10/15/2003   for management of her extreme menorrhagi  . PORTACATH PLACEMENT Right 04/02/2017   Procedure: INSERTION PORT-A-CATH;  Surgeon: [name], MD;  Location: [facility];  Service: General;  Laterality: Right;  . REMOVAL OF BILATERAL TISSUE EXPANDERS WITH PLACEMENT OF BILATERAL BREAST IMPLANTS Bilateral 01/17/2018   Procedure: REMOVAL OF BILATERAL TISSUE EXPANDERS WITH PLACEMENT OF BILATERAL SILICONE BREAST IMPLANTS;  Surgeon: [name], MD;  Location: [place];  Service: Plastics;  Laterality: Bilateral;  . TUBAL LIGATION  09/2000   bilateral    FAMILY HISTORY Family History  Problem Relation Age of Onset  . Hypertension Father   . Diabetes Father   . Other Father        stomach mass suspicious for ca, never bx  . Coronary artery disease Mother   . Kidney cancer Mother 31  . Coronary artery disease Maternal Grandfather   . Colon cancer Maternal Grandfather 61       recurrence  .  Coronary artery disease Maternal Uncle   . Coronary artery disease Maternal Uncle   . Diabetes Maternal Uncle   . Colon cancer Maternal Grandmother 29  . Breast cancer Cousin 52       had GT- CHEK2+  . Cervical cancer Maternal Aunt 35  . Diabetes Maternal Aunt   . Alzheimer's disease Paternal Grandmother   . Colon cancer Paternal Grandfather 41  . Breast cancer Paternal Aunt   . Breast cancer Paternal Aunt   . Breast cancer Other 64   Her father died 2016-02-14 at age 67 just 67 week shy of his 83rd birthday. Her mother is 47 years old as of October 2018. Patient  has one brother and no sisters. Her mother was diagnosed with kidney cancer at age 77 with a nephrectomy following. Her maternal grandmother was diagnosed with colon cancer at age 41.  The patient paternal grandfather was diagnosed with colon cancer in his 32's. She has paternal cousins through her fathers half-sisters that have been diagnosed with breast cancer, she is unsure of the number of breast cancer occurrences due to the distance . A maternal cousin was diagnosed with breast cancer at age 4 and she had genetic testing. The patient does have a copy of this testing as well as her PCP.  This was not available for review on the nasal  GYNECOLOGIC HISTORY:  No LMP recorded. Patient is postmenopausal.   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 Her husband [name] is a Airline pilot. Her daughter [name] is 73 and currently a sophomore at [employer]. 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  The patient attends [employer]. She lives in [place].      ADVANCED DIRECTIVES:    HEALTH MAINTENANCE: Social History   Tobacco Use  . Smoking status: Never Smoker  . Smokeless tobacco: Never Used  Substance Use Topics  . Alcohol use: Yes    Comment: Occasionally drinks wine  . Drug use: No     Colonoscopy: December 2019 (Out[facility])  PAP: March 2018   Bone density: N/A   Allergies  Allergen Reactions  . Codeine Itching  . Biaxin [Clarithromycin]     Heart Burn    Current Outpatient Medications  Medication Sig Dispense Refill  . acetaminophen (TYLENOL) 325 MG tablet Take 650 mg by mouth every 6 (six) hours as needed.    . Cholecalciferol (VITAMIN D) 2000 units CAPS Take 2,000 Units by mouth  daily.    [name] escitalopram (LEXAPRO) 10 MG tablet Take 10 mg by mouth daily.    . Estradiol 10 MCG TABS vaginal tablet INSERT 1 TABLET VAGINALLY 2 TIMES A WEEK FOR 4 WEEKS, AND THEN MAY DECREASE TO WEEKLY 8 tablet 0  . glucosamine-chondroitin 500-400 MG tablet Take 1 tablet by mouth daily.     [name] ibuprofen (ADVIL,MOTRIN) 200 MG tablet Take 400 mg by mouth every 8 (eight) hours as needed for headache or mild pain.     . Multiple Minerals-Vitamins (CALCIUM-MAGNESIUM-ZINC) TABS Take 1 tablet by mouth daily.     . Multiple Vitamins-Minerals (MULTIVITAMINS THER. W/MINERALS) TABS Take 1 tablet by mouth daily.     [name] omeprazole (PRILOSEC) 40 MG  capsule TAKE 1 CAPSULE BY MOUTH AT BEDTIME 30 capsule 0  . Polyethyl Glycol-Propyl Glycol (SYSTANE OP) Apply 1 drop to eye 2 (two) times daily.    [name] sulfamethoxazole-trimethoprim (BACTRIM DS,SEPTRA DS) 800-160 MG tablet Take 1 tablet by mouth 2 (two) times daily. 14 tablet 0  . venlafaxine XR (EFFEXOR-XR) 75 MG 24 hr capsule Take 1 capsule (75 mg total) by mouth daily with breakfast. 90 capsule 4   No current facility-administered medications for this visit.    Facility-Administered Medications Ordered in Other Visits  Medication Dose Route Frequency Provider Last Rate Last Dose  . sodium chloride flush (NS) 0.9 % injection 10 mL  10 mL Intracatheter PRN Ambriella Kitt, Virgie Dad, MD   10 mL at 07/04/17 1749    OBJECTIVE: Young white woman who appears stated age Vitals:   05/12/18 1453  BP: 128/72  Pulse: 94  Resp: 18  Temp: 98.5 F (36.9 C)  SpO2: 100%     Body mass index is 24.7 kg/m.   Wt Readings from Last 3 Encounters:  05/12/18 148 lb 6.4 oz (67.3 kg)  01/30/18 152 lb (68.9 kg)  01/17/18 150 lb 3.2 oz (68.1 kg)   ECOG FS:1  Sclerae unicteric, EOMs intact Oropharynx clear and moist No cervical or supraclavicular adenopathy Lungs no rales or rhonchi Heart regular rate and rhythm Abd soft, nontender, positive bowel sounds MSK no focal spinal  tenderness, no upper extremity lymphedema Neuro: nonfocal, well oriented, appropriate affect Breasts: Status post bilateral mastectomies with bilateral implant reconstruction.  There is no evidence of disease recurrence.  Both axillae are benign.  LAB RESULTS:  CMP     Component Value Date/Time   NA 139 05/12/2018 1439   NA 137 05/30/2017 1219   K 4.4 05/12/2018 1439   K 3.8 05/30/2017 1219   CL 101 05/12/2018 1439   CO2 28 05/12/2018 1439   CO2 25 05/30/2017 1219   GLUCOSE 124 (H) 05/12/2018 1439   GLUCOSE 122 05/30/2017 1219   BUN 15 05/12/2018 1439   BUN 8.9 05/30/2017 1219   CREATININE 0.76 05/12/2018 1439   CREATININE 0.7 05/30/2017 1219   CALCIUM 9.6 05/12/2018 1439   CALCIUM 9.4 05/30/2017 1219   PROT 7.5 05/12/2018 1439   PROT 6.4 05/30/2017 1219   ALBUMIN 4.1 05/12/2018 1439   ALBUMIN 3.7 05/30/2017 1219   AST 14 (L) 05/12/2018 1439   AST 12 05/30/2017 1219   ALT 15 05/12/2018 1439   ALT 14 05/30/2017 1219   ALKPHOS 104 05/12/2018 1439   ALKPHOS 137 05/30/2017 1219   BILITOT 0.2 (L) 05/12/2018 1439   BILITOT 0.38 05/30/2017 1219   GFRNONAA >60 05/12/2018 1439   GFRAA >60 05/12/2018 1439    No results found for: TOTALPROTELP, ALBUMINELP, A1GS, A2GS, BETS, BETA2SER, GAMS, MSPIKE, SPEI  No results found for: KPAFRELGTCHN, LAMBDASER, KAPLAMBRATIO  Lab Results  Component Value Date   WBC 9.2 05/12/2018   NEUTROABS 5.2 05/12/2018   HGB 14.2 05/12/2018   HCT 42.8 05/12/2018   MCV 93.9 05/12/2018   PLT 378 05/12/2018    '@LASTCHEMISTRY' @  No results found for: LABCA2  No components found for: SWHQPR916  No results for input(s): INR in the last 168 hours.  No results found for: LABCA2  No results found for: BWG665  No results found for: LDJ570  No results found for: VXB939  No results found for: CA2729  No components found for: HGQUANT  No results found for: CEA1 / No results found for: CEA1  No results found for: AFPTUMOR  No results found  for: [place]  No results found for: PSA1  Appointment on 05/12/2018  Component Date Value Ref Range Status  . Sodium 05/12/2018 139  135 - 145 mmol/L Final  . Potassium 05/12/2018 4.4  3.5 - 5.1 mmol/L Final  . Chloride 05/12/2018 101  98 - 111 mmol/L Final  . CO2 05/12/2018 28  22 - 32 mmol/L Final  . Glucose, Bld 05/12/2018 124* 70 - 99 mg/dL Final  . BUN 05/12/2018 15  6 - 20 mg/dL Final  . Creatinine, Ser 05/12/2018 0.76  0.44 - 1.00 mg/dL Final  . Calcium 05/12/2018 9.6  8.9 - 10.3 mg/dL Final  . Total Protein 05/12/2018 7.5  6.5 - 8.1 g/dL Final  . Albumin 05/12/2018 4.1  3.5 - 5.0 g/dL Final  . AST 05/12/2018 14* 15 - 41 U/L Final  . ALT 05/12/2018 15  0 - 44 U/L Final  . Alkaline Phosphatase 05/12/2018 104  38 - 126 U/L Final  . Total Bilirubin 05/12/2018 0.2* 0.3 - 1.2 mg/dL Final  . GFR calc non Af Amer 05/12/2018 >60  >60 mL/min Final  . GFR calc Af Amer 05/12/2018 >60  >60 mL/min Final  . Anion gap 05/12/2018 10  5 - 15 Final   Performed at [facility], [place] [address], [place], [place] [address]  . WBC 05/12/2018 9.2  4.0 - 10.5 K/uL Final  . RBC 05/12/2018 4.56  3.87 - 5.11 MIL/uL Final  . Hemoglobin 05/12/2018 14.2  12.0 - 15.0 g/dL Final  . HCT 05/12/2018 42.8  36.0 - 46.0 % Final  . MCV 05/12/2018 93.9  80.0 - 100.0 fL Final  . MCH 05/12/2018 31.1  26.0 - 34.0 pg Final  . MCHC 05/12/2018 33.2  30.0 - 36.0 g/dL Final  . RDW 05/12/2018 11.7  11.5 - 15.5 % Final  . Platelets 05/12/2018 378  150 - 400 K/uL Final  . nRBC 05/12/2018 0.0  0.0 - 0.2 % Final  . Neutrophils Relative % 05/12/2018 57  % Final  . Neutro Abs 05/12/2018 5.2  1.7 - 7.7 K/uL Final  . Lymphocytes Relative 05/12/2018 28  % Final  . Lymphs Abs 05/12/2018 2.5  0.7 - 4.0 K/uL Final  . Monocytes Relative 05/12/2018 10  % Final  . Monocytes Absolute 05/12/2018 1.0  0.1 - 1.0 K/uL Final  . Eosinophils Relative 05/12/2018 4  % Final  . Eosinophils Absolute 05/12/2018 0.4   0.0 - 0.5 K/uL Final  . Basophils Relative 05/12/2018 0  % Final  . Basophils Absolute 05/12/2018 0.0  0.0 - 0.1 K/uL Final  . Immature Granulocytes 05/12/2018 1  % Final  . Abs Immature Granulocytes 05/12/2018 0.12* 0.00 - 0.07 K/uL Final   Performed at [facility], [place] [address] [place], [place] [address]    (this displays the last labs from the last 3 days)  No results found for: TOTALPROTELP, ALBUMINELP, A1GS, A2GS, BETS, BETA2SER, GAMS, MSPIKE, SPEI (this displays SPEP labs)  No results found for: KPAFRELGTCHN, LAMBDASER, KAPLAMBRATIO (kappa/lambda light chains)  No results found for: HGBA, HGBA2QUANT, HGBFQUANT, HGBSQUAN (Hemoglobinopathy evaluation)   No results found for: LDH  No results found for: IRON, TIBC, IRONPCTSAT (Iron and TIBC)  No results found for: FERRITIN  Urinalysis No results found for: COLORURINE, APPEARANCEUR, LABSPEC, PHURINE, GLUCOSEU, HGBUR, BILIRUBINUR, KETONESUR, PROTEINUR, UROBILINOGEN, NITRITE, LEUKOCYTESUR   STUDIES: [place] Axilla Left  Result Date: 04/17/2018 CLINICAL DATA:  52 year old  female presenting for ultrasound of the left axilla to evaluate a palpable lump. The patient has history of bilateral mastectomy following diagnosis of cancer in the right breast. She has felt this lump since surgery, and feels that it has decreased in size. EXAM: ULTRASOUND OF THE LEFT AXILLA COMPARISON:  None. FINDINGS: On physical exam,there is a firm mobile mass in the anterior inferior left axilla. Ultrasound of the palpable site in the left axilla demonstrates both an echogenic oval mass measuring up to 1.6 cm, within an associated adjacent anechoic superficial mass. These findings are compatible with benign fat necrosis. IMPRESSION: The palpable lump in the left axilla is consistent with benign fat necrosis. RECOMMENDATION: Clinical followup is recommended with imaging evaluation as clinically indicated. I have discussed the findings and  recommendations with the patient. Results were also provided in writing at the conclusion of the visit. If applicable, a reminder letter will be sent to the patient regarding the next appointment. BI-RADS CATEGORY  2: Benign. Electronically Signed   By: Ammie Ferrier M.D.   On: 04/17/2018 13:31    ELIGIBLE FOR AVAILABLE RESEARCH PROTOCOL: Enrolled in cancer disparities study   ASSESSMENT: 52 y.o. [place] woman status post right breast upper inner quadrant biopsy March 21, 2017 for a clinical T2 N0  Stage IIB invasive ductal carcinoma, grade 3, triple negative, with an MIB-1 of 80%.  (a) biopsy of 2 additional suspicious areas in the right breast and one in the left breast 04/11/2017 showed atypical lobular hyperplasia and sclerosing adenosis but no evidence of cancer  (1) neoadjuvant chemotherapy consisting of doxorubicin and cyclophosphamide in dose dense fashion x4 completed 05/23/2017, followed by paclitaxel/carboplatin weekly x12, first dose 06/06/2017, completed 08/29/2017  (2) status post bilateral mastectomies 10/08/2017, showing  (a) left breast, atypical lobular hyperplasia, no evidence of malignancy  (b) right breast, residual  pT1b pN0 invasive ductal carcinoma, grade 3, with negative margins.  A total of 2 sentinel lymph nodes were removed; repeat prognostic panel requested 10/18/2017  (c) immediate expander reconstruction with silicone implants on 17/00/1749  (3) adjuvant radiation not indicated  (4) genetics testing 04/22/2017 through the common hereditary cancer panel plus renal/urinary tract cancel panel showed no deleterious mutations in APC, ATM, AXIN2, BAP1, BARD1, BMPR1A, BRCA1, BRCA2, BRIP1, BUB1B, CDC73, CDH1, CDK4, CDKN1C, CDKN2A (p14ARF), CDKN2A (p16INK4a), CEP57, CHEK2, CTNNA1, DICER1, DIS3L2, EPCAM*, FH, FLCN, GPC3, GREM1*, KIT, MEN1, MET, MLH1,MSH2, MSH3, MSH6, MUTYH, NBN, NF1, PALB2, PDGFRA, PMS2, POLD1, POLE, PTEN, RAD50, RAD51C, RAD51D, SDHB,SDHC, SDHD,  SMAD4, SMARCA4, SMARCB1, STK11, TP53, TSC1, TSC2, VHL, WT1.The following genes were evaluated for sequence changes only: HOXB13*, MITF*, NTHL1*, SDHA  (a) there was a Variant of Uncertain Significance identified in POLD1, namely c.2953C>T (p.Arg985Trp)  (5) declined participation in S 1418 due to concerns regarding side effects  PLAN:  Aston is now a little over half a year out from definitive surgery for her breast cancer with no evidence of disease recurrence.  This is very favorable.  She continues quite anxious about the possibility of breast recurrence, which is of course normal at this point.  She understands of the first 2 years are the most dangerous ones in triple negative cases  She is using vaginal estrogens.  She is status post bilateral mastectomies and her cancer was triple negative.  She understands that while we really have very little data for the use of even vaginal estrogens in women with a history of breast cancer, even triple negative breast cancer, I really think this is  making a significant contribution to her quality of life and I am not uncomfortable with her continuing.  She just had a colonoscopy, but does not yet have the final results.  She had a squamous cell skin cancer removed by Dr. Salley Scarlet from her upper right chest.  She continues with at least yearly dermatologic follow-up through his office  She will see me again in 6 months.  She will see Dr. Lucia Gaskins a year from now  She knows to call for any other issues that may develop before the next visit.   Hatcher Froning, Virgie Dad, MD  05/12/18 3:31 PM Medical Oncology and Hematology [facility] [address] [place], [place] [address] Tel. [phone]    Fax. [phone]   I, Jacqualyn Posey am acting as a Education administrator for Chauncey Cruel, MD.   I, Lurline Del MD, have reviewed the above documentation for accuracy and completeness, and I agree with the above.

## 2018-05-12 ENCOUNTER — Inpatient Hospital Stay (HOSPITAL_BASED_OUTPATIENT_CLINIC_OR_DEPARTMENT_OTHER): Payer: 59 | Admitting: Oncology

## 2018-05-12 ENCOUNTER — Inpatient Hospital Stay: Payer: 59 | Attending: Oncology

## 2018-05-12 ENCOUNTER — Telehealth: Payer: Self-pay | Admitting: Oncology

## 2018-05-12 VITALS — BP 128/72 | HR 94 | Temp 98.5°F | Resp 18 | Ht 65.0 in | Wt 148.4 lb

## 2018-05-12 DIAGNOSIS — Z9013 Acquired absence of bilateral breasts and nipples: Secondary | ICD-10-CM | POA: Insufficient documentation

## 2018-05-12 DIAGNOSIS — Z9221 Personal history of antineoplastic chemotherapy: Secondary | ICD-10-CM | POA: Diagnosis not present

## 2018-05-12 DIAGNOSIS — Z9012 Acquired absence of left breast and nipple: Secondary | ICD-10-CM

## 2018-05-12 DIAGNOSIS — C50411 Malignant neoplasm of upper-outer quadrant of right female breast: Secondary | ICD-10-CM

## 2018-05-12 DIAGNOSIS — Z79899 Other long term (current) drug therapy: Secondary | ICD-10-CM | POA: Insufficient documentation

## 2018-05-12 DIAGNOSIS — Z171 Estrogen receptor negative status [ER-]: Secondary | ICD-10-CM | POA: Diagnosis not present

## 2018-05-12 DIAGNOSIS — Z853 Personal history of malignant neoplasm of breast: Secondary | ICD-10-CM

## 2018-05-12 DIAGNOSIS — Z923 Personal history of irradiation: Secondary | ICD-10-CM | POA: Insufficient documentation

## 2018-05-12 LAB — CBC WITH DIFFERENTIAL/PLATELET
Abs Immature Granulocytes: 0.12 10*3/uL — ABNORMAL HIGH (ref 0.00–0.07)
BASOS ABS: 0 10*3/uL (ref 0.0–0.1)
Basophils Relative: 0 %
EOS ABS: 0.4 10*3/uL (ref 0.0–0.5)
EOS PCT: 4 %
HEMATOCRIT: 42.8 % (ref 36.0–46.0)
Hemoglobin: 14.2 g/dL (ref 12.0–15.0)
Immature Granulocytes: 1 %
LYMPHS ABS: 2.5 10*3/uL (ref 0.7–4.0)
LYMPHS PCT: 28 %
MCH: 31.1 pg (ref 26.0–34.0)
MCHC: 33.2 g/dL (ref 30.0–36.0)
MCV: 93.9 fL (ref 80.0–100.0)
MONO ABS: 1 10*3/uL (ref 0.1–1.0)
MONOS PCT: 10 %
NRBC: 0 % (ref 0.0–0.2)
Neutro Abs: 5.2 10*3/uL (ref 1.7–7.7)
Neutrophils Relative %: 57 %
Platelets: 378 10*3/uL (ref 150–400)
RBC: 4.56 MIL/uL (ref 3.87–5.11)
RDW: 11.7 % (ref 11.5–15.5)
WBC: 9.2 10*3/uL (ref 4.0–10.5)

## 2018-05-12 LAB — COMPREHENSIVE METABOLIC PANEL
ALBUMIN: 4.1 g/dL (ref 3.5–5.0)
ALT: 15 U/L (ref 0–44)
AST: 14 U/L — AB (ref 15–41)
Alkaline Phosphatase: 104 U/L (ref 38–126)
Anion gap: 10 (ref 5–15)
BUN: 15 mg/dL (ref 6–20)
CALCIUM: 9.6 mg/dL (ref 8.9–10.3)
CHLORIDE: 101 mmol/L (ref 98–111)
CO2: 28 mmol/L (ref 22–32)
CREATININE: 0.76 mg/dL (ref 0.44–1.00)
GFR calc Af Amer: >60 mL/min (ref >60)
GFR calc non Af Amer: >60 mL/min (ref >60)
GLUCOSE: 124 mg/dL — AB (ref 70–99)
Potassium: 4.4 mmol/L (ref 3.5–5.1)
SODIUM: 139 mmol/L (ref 135–145)
Total Bilirubin: 0.2 mg/dL — ABNORMAL LOW (ref 0.3–1.2)
Total Protein: 7.5 g/dL (ref 6.5–8.1)

## 2018-05-12 NOTE — Telephone Encounter (Signed)
Per 12/16 patient decline avs and calendar

## 2018-05-15 DIAGNOSIS — M9902 Segmental and somatic dysfunction of thoracic region: Secondary | ICD-10-CM | POA: Diagnosis not present

## 2018-05-15 DIAGNOSIS — G5603 Carpal tunnel syndrome, bilateral upper limbs: Secondary | ICD-10-CM | POA: Diagnosis not present

## 2018-05-15 DIAGNOSIS — M9901 Segmental and somatic dysfunction of cervical region: Secondary | ICD-10-CM | POA: Diagnosis not present

## 2018-06-03 DIAGNOSIS — M9902 Segmental and somatic dysfunction of thoracic region: Secondary | ICD-10-CM | POA: Diagnosis not present

## 2018-06-03 DIAGNOSIS — M9901 Segmental and somatic dysfunction of cervical region: Secondary | ICD-10-CM | POA: Diagnosis not present

## 2018-06-03 DIAGNOSIS — G5603 Carpal tunnel syndrome, bilateral upper limbs: Secondary | ICD-10-CM | POA: Diagnosis not present

## 2018-06-04 DIAGNOSIS — D1801 Hemangioma of skin and subcutaneous tissue: Secondary | ICD-10-CM | POA: Diagnosis not present

## 2018-06-04 DIAGNOSIS — Z85828 Personal history of other malignant neoplasm of skin: Secondary | ICD-10-CM | POA: Diagnosis not present

## 2018-06-04 DIAGNOSIS — D225 Melanocytic nevi of trunk: Secondary | ICD-10-CM | POA: Diagnosis not present

## 2018-06-16 DIAGNOSIS — G5603 Carpal tunnel syndrome, bilateral upper limbs: Secondary | ICD-10-CM | POA: Diagnosis not present

## 2018-06-20 DIAGNOSIS — G5603 Carpal tunnel syndrome, bilateral upper limbs: Secondary | ICD-10-CM | POA: Diagnosis not present

## 2018-06-20 DIAGNOSIS — M9902 Segmental and somatic dysfunction of thoracic region: Secondary | ICD-10-CM | POA: Diagnosis not present

## 2018-06-20 DIAGNOSIS — M9901 Segmental and somatic dysfunction of cervical region: Secondary | ICD-10-CM | POA: Diagnosis not present

## 2018-07-09 DIAGNOSIS — M9902 Segmental and somatic dysfunction of thoracic region: Secondary | ICD-10-CM | POA: Diagnosis not present

## 2018-07-09 DIAGNOSIS — Z9013 Acquired absence of bilateral breasts and nipples: Secondary | ICD-10-CM | POA: Diagnosis not present

## 2018-07-09 DIAGNOSIS — G5603 Carpal tunnel syndrome, bilateral upper limbs: Secondary | ICD-10-CM | POA: Diagnosis not present

## 2018-07-09 DIAGNOSIS — Z853 Personal history of malignant neoplasm of breast: Secondary | ICD-10-CM | POA: Diagnosis not present

## 2018-07-09 DIAGNOSIS — M9901 Segmental and somatic dysfunction of cervical region: Secondary | ICD-10-CM | POA: Diagnosis not present

## 2018-07-15 ENCOUNTER — Other Ambulatory Visit: Payer: Self-pay | Admitting: Oncology

## 2018-07-25 DIAGNOSIS — M9901 Segmental and somatic dysfunction of cervical region: Secondary | ICD-10-CM | POA: Diagnosis not present

## 2018-07-25 DIAGNOSIS — M9902 Segmental and somatic dysfunction of thoracic region: Secondary | ICD-10-CM | POA: Diagnosis not present

## 2018-07-25 DIAGNOSIS — G5603 Carpal tunnel syndrome, bilateral upper limbs: Secondary | ICD-10-CM | POA: Diagnosis not present

## 2018-07-30 ENCOUNTER — Telehealth: Payer: Self-pay

## 2018-07-30 DIAGNOSIS — M25521 Pain in right elbow: Secondary | ICD-10-CM | POA: Insufficient documentation

## 2018-07-30 DIAGNOSIS — M189 Osteoarthritis of first carpometacarpal joint, unspecified: Secondary | ICD-10-CM | POA: Insufficient documentation

## 2018-07-30 DIAGNOSIS — M1811 Unilateral primary osteoarthritis of first carpometacarpal joint, right hand: Secondary | ICD-10-CM | POA: Diagnosis not present

## 2018-07-30 DIAGNOSIS — M25522 Pain in left elbow: Secondary | ICD-10-CM | POA: Diagnosis not present

## 2018-07-30 DIAGNOSIS — M79642 Pain in left hand: Secondary | ICD-10-CM | POA: Diagnosis not present

## 2018-07-30 DIAGNOSIS — G5603 Carpal tunnel syndrome, bilateral upper limbs: Secondary | ICD-10-CM | POA: Diagnosis not present

## 2018-07-30 DIAGNOSIS — M654 Radial styloid tenosynovitis [de Quervain]: Secondary | ICD-10-CM | POA: Diagnosis not present

## 2018-07-30 DIAGNOSIS — M79641 Pain in right hand: Secondary | ICD-10-CM | POA: Diagnosis not present

## 2018-07-30 NOTE — Telephone Encounter (Signed)
Spoke with patient to remind of SCP visit with NP on 08/07/18 at 10 am.  Patient said she will come to appt.

## 2018-08-07 ENCOUNTER — Encounter: Payer: 59 | Admitting: Adult Health

## 2018-08-19 ENCOUNTER — Telehealth: Payer: Self-pay

## 2018-08-19 NOTE — Telephone Encounter (Signed)
LVM for patient to call back so we can reschedule her SCP visit with [place] for 4/16 or 09/12/2018 to an e-visit.

## 2018-08-26 ENCOUNTER — Inpatient Hospital Stay: Payer: 59 | Admitting: Adult Health

## 2018-08-28 DIAGNOSIS — M659 Synovitis and tenosynovitis, unspecified: Secondary | ICD-10-CM | POA: Insufficient documentation

## 2018-08-28 DIAGNOSIS — M65831 Other synovitis and tenosynovitis, right forearm: Secondary | ICD-10-CM | POA: Diagnosis not present

## 2018-08-28 DIAGNOSIS — M79642 Pain in left hand: Secondary | ICD-10-CM | POA: Diagnosis not present

## 2018-08-28 DIAGNOSIS — M65931 Unspecified synovitis and tenosynovitis, right forearm: Secondary | ICD-10-CM | POA: Insufficient documentation

## 2018-08-28 DIAGNOSIS — M79641 Pain in right hand: Secondary | ICD-10-CM | POA: Diagnosis not present

## 2018-09-09 DIAGNOSIS — Z13820 Encounter for screening for osteoporosis: Secondary | ICD-10-CM | POA: Diagnosis not present

## 2018-09-09 DIAGNOSIS — Z01419 Encounter for gynecological examination (general) (routine) without abnormal findings: Secondary | ICD-10-CM | POA: Diagnosis not present

## 2018-09-09 DIAGNOSIS — Z6825 Body mass index (BMI) 25.0-25.9, adult: Secondary | ICD-10-CM | POA: Diagnosis not present

## 2018-09-09 DIAGNOSIS — N958 Other specified menopausal and perimenopausal disorders: Secondary | ICD-10-CM | POA: Diagnosis not present

## 2018-09-11 ENCOUNTER — Telehealth: Payer: Self-pay | Admitting: Adult Health

## 2018-09-11 NOTE — Telephone Encounter (Signed)
Called patient regarding upcoming appointments, patient has access for Webex and the e-mail will be sent.

## 2018-09-12 ENCOUNTER — Other Ambulatory Visit: Payer: Self-pay

## 2018-09-12 ENCOUNTER — Inpatient Hospital Stay: Payer: 59 | Attending: Adult Health | Admitting: Adult Health

## 2018-09-12 ENCOUNTER — Encounter: Payer: Self-pay | Admitting: Adult Health

## 2018-09-12 DIAGNOSIS — Z9221 Personal history of antineoplastic chemotherapy: Secondary | ICD-10-CM | POA: Diagnosis not present

## 2018-09-12 DIAGNOSIS — C50411 Malignant neoplasm of upper-outer quadrant of right female breast: Secondary | ICD-10-CM | POA: Diagnosis not present

## 2018-09-12 DIAGNOSIS — Z171 Estrogen receptor negative status [ER-]: Secondary | ICD-10-CM | POA: Diagnosis not present

## 2018-09-12 NOTE — Progress Notes (Signed)
SURVIVORSHIP VIRTUAL VISIT:  I connected with Jennifer Beck on 09/12/18 at  8:00 AM EDT VIA WEBEX and verified that I am speaking with the correct person using two identifiers.  I discussed the limitations, risks, security and privacy concerns of performing an evaluation and management service by telephone and the availability of in person appointments. I also discussed with the patient that there may be a patient responsible charge related to this service. The patient expressed understanding and agreed to proceed.   BRIEF ONCOLOGIC HISTORY:    Malignant neoplasm of upper-outer quadrant of right breast in female, estrogen receptor negative ([place])   03/21/2017 Initial Diagnosis    status post right breast upper inner quadrant biopsy March 21, 2017 for a clinical T2 N0  Stage IIB invasive ductal carcinoma, grade 3, triple negative, with an MIB-1 of 80%.             (a) biopsy of 2 additional suspicious areas in the right breast and one in the left breast 04/11/2017 showed atypical lobular hyperplasia and sclerosing adenosis but no evidence of cancer    05/02/2017 Genetic Testing    The patient had genetic testing due to a personal history of breast cancer and a family history of breast, kidney, and colon cancer. There was also a family history of a CHEK2 mutation identified in Jennifer Beck maternal cousin.   The Common Hereditary Cancer Panel + Renal/Urinary Tract Cancer Panel was ordered (60 genes). APC, ATM, AXIN2, BAP1, BARD1, BMPR1A, BRCA1, BRCA2, BRIP1, BUB1B, CDC73, CDH1, CDK4, CDKN1C, CDKN2A (p14ARF),CDKN2A (p16INK4a), CEP57, CHEK2, CTNNA1, DICER1, DIS3L2, EPCAM*, FH, FLCN, GPC3, GREM1*, KIT, MEN1, MET, MLH1, MSH2, MSH3, MSH6, MUTYH, NBN, NF1, PALB2, PDGFRA, PMS2, POLD1, POLE, PTEN, RAD50, RAD51C, RAD51D, SDHB, SDHC, SDHD, SMAD4, SMARCA4, SMARCB1, STK11, TP53, TSC1, TSC2, VHL, WT1. The following genes were evaluated for sequence changes only: HOXB13*, MITF*, NTHL1*, SDHA NTHL1*, SDHA  Results: No  pathogenic variants were identified.  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  The familial CHEK2 mutation WAS NOT detected.  The date of this test report is 05/02/2017.     05/23/2017 - 08/29/2017 Neo-Adjuvant Chemotherapy    neoadjuvant chemotherapy consisting of doxorubicin and cyclophosphamide in dose dense fashion x4 completed 05/23/2017, followed by paclitaxel/carboplatin weekly x12, first dose 06/06/2017, completed 08/29/2017     10/08/2017 Surgery    status post bilateral mastectomies 10/08/2017, showing             (a) left breast, atypical lobular hyperplasia, no evidence of malignancy             (b) right breast, residual  pT1b pN0 invasive ductal carcinoma, grade 3, with negative margins.  A total of 2 sentinel lymph nodes were removed; repeat prognostic panel requested 10/18/2017             (c) immediate expander reconstruction with silicone implants on 30/16/0109     INTERVAL HISTORY:  Jennifer Beck to review her survivorship care plan detailing her treatment course for breast cancer, as well as monitoring long-term side effects of that treatment, education regarding health maintenance, screening, and overall wellness and health promotion.     Overall, Jennifer Beck reports feeling quite well.  She is walking some and is currently working from home to due [name] [employer] precautions, which she enjoys.    Baili has had more muscle and achiness issues since completing her chemotherapy.  She is wearing wrist splints.  She notes it is worse in the  morning.  She denies any peripheral neuropathy.  She has seen  Ortho.  She wonders if she might need a nerve conduction study.    She is still having hot flashes.  She has tried gabapentin, and effexor.  She is taking black cohosh, and that has helped somewhat.  Vaginal dryness/decreased libido.  Wants to try intrarosa and has samples from her gynecologist.  She was recommended to receive input from  Dr. Jana Hakim prior to starting.    REVIEW OF SYSTEMS:  Review of Systems  Constitutional: Negative for appetite change, chills, fatigue, fever and unexpected weight change.  HENT:   Negative for hearing loss, lump/mass, mouth sores, sore throat and trouble swallowing.   Eyes: Negative for eye problems and icterus.  Respiratory: Negative for chest tightness, cough and shortness of breath.   Cardiovascular: Negative for chest pain, leg swelling and palpitations.  Gastrointestinal: Negative for abdominal distention, abdominal pain, constipation, diarrhea, nausea and vomiting.  Endocrine: Negative for hot flashes.  Genitourinary: Negative for difficulty urinating.   Musculoskeletal: Negative for arthralgias.  Skin: Negative for itching and rash.  Neurological: Negative for dizziness, extremity weakness, headaches and numbness.  Hematological: Negative for adenopathy. Does not bruise/bleed easily.  Psychiatric/Behavioral: Negative for depression. The patient is not nervous/anxious.   Breast: Denies any new nodularity, masses, tenderness, nipple changes, or nipple discharge.      ONCOLOGY TREATMENT TEAM:  1. Surgeon:  Dr. Lucia Gaskins at [facility] 2. Medical Oncologist: Dr. Jana Hakim  3. Radiation Oncologist: Dr. Isidore Moos    PAST MEDICAL/SURGICAL HISTORY:  Past Medical History:  Diagnosis Date  . Arthritis    leg and muscle pains  . Chest pain   . Complication of anesthesia   . Endometriosis   . Family history of breast cancer 04/22/2017  . Family history of colon cancer 04/22/2017  . Family history of kidney cancer 04/22/2017  . Fatigue   . GERD (gastroesophageal reflux disease)   . GI bleed    caused by ischemic colitis following an episode of hypertension  . Heart palpitations   . Hx of echocardiogram    a. echo 9/13:  EF 55-60%, Gr 2 diast dysfn  . Hypercholesterolemia   . Ischemic colitis ([place])   . Myocarditis ([place])   . Pericarditis   . PONV (postoperative  nausea and vomiting)   . Restrictive pericarditis 2004  . SOB (shortness of breath)    Past Surgical History:  Procedure Laterality Date  . BREAST RECONSTRUCTION WITH PLACEMENT OF TISSUE EXPANDER AND ALLODERM Bilateral 10/08/2017   Procedure: BREAST RECONSTRUCTION WITH PLACEMENT OF TISSUE EXPANDER AND ALLODERM;  Surgeon: [name], MD;  Location: [facility];  Service: Plastics;  Laterality: Bilateral;  . CARDIAC CATHETERIZATION  08/26/2001   EF of 60% --- smooth & normal coronary arteries -- there is a Riffel motion defect consistent with posterolateral MI -- she quite possibly had a coronary spasm -- she may have some pericarditis secondary to her MI   . DILATION AND CURETTAGE OF UTERUS  10/15/2003   Uterine enlargement, menorrhagia  . DILATION AND CURETTAGE OF UTERUS  05/27/2002   Dysfunctional uterine bleeding  . LAPAROSCOPY    . LIPOSUCTION WITH LIPOFILLING Bilateral 01/17/2018   Procedure: LIPOFILLING FROM ABDOMEN TO CHEST;  Surgeon: [name], MD;  Location: [facility];  Service: Plastics;  Laterality: Bilateral;  . MASTECTOMY W/ SENTINEL NODE BIOPSY Bilateral 10/08/2017   Procedure: RIGHT MASTECTOMY WITH RIGHT AXILLARY SENTINEL LYMPH NODE BIOPSY AND LEFT PROPHYLACTIC MASTECTOMY;  Surgeon: [name], MD;  Location: [facility];  Service: General;  Laterality: Bilateral;  . NOVASURE ABLATION  10/15/2003   for management of her extreme menorrhagi  . PORTACATH PLACEMENT Right 04/02/2017   Procedure: INSERTION PORT-A-CATH;  Surgeon: [name], MD;  Location: [facility];  Service: General;  Laterality: Right;  . REMOVAL OF BILATERAL TISSUE EXPANDERS WITH PLACEMENT OF BILATERAL BREAST IMPLANTS Bilateral 01/17/2018   Procedure: REMOVAL OF BILATERAL TISSUE EXPANDERS WITH PLACEMENT OF BILATERAL SILICONE BREAST IMPLANTS;  Surgeon: [name], MD;  Location: [facility];  Service: Plastics;  Laterality: Bilateral;  .  TUBAL LIGATION  09/2000   bilateral     ALLERGIES:  Allergies  Allergen Reactions  . Codeine Itching  . Biaxin [Clarithromycin]     Heart Burn     CURRENT MEDICATIONS:  Outpatient Encounter Medications as of 09/12/2018  Medication Sig  . acetaminophen (TYLENOL) 325 MG tablet Take 650 mg by mouth every 6 (six) hours as needed.  . Cholecalciferol (VITAMIN D) 2000 units CAPS Take 2,000 Units by mouth daily.  . Estradiol 10 MCG TABS vaginal tablet INSERT 1 TABLET VAGINALLY 2 TIMES A WEEK FOR 4 WEEKS. MAY DECREASE TO WEEKLY  . glucosamine-chondroitin 500-400 MG tablet Take 1 tablet by mouth daily.   [name] ibuprofen (ADVIL,MOTRIN) 200 MG tablet Take 400 mg by mouth every 8 (eight) hours as needed for headache or mild pain.   . Multiple Minerals-Vitamins (CALCIUM-MAGNESIUM-ZINC) TABS Take 1 tablet by mouth daily.   . Multiple Vitamins-Minerals (MULTIVITAMINS THER. W/MINERALS) TABS Take 1 tablet by mouth daily.   Vladimir Faster Glycol-Propyl Glycol (SYSTANE OP) Apply 1 drop to eye 2 (two) times daily.  . [DISCONTINUED] prochlorperazine (COMPAZINE) 10 MG tablet Take 1 tablet (10 mg total) by mouth every 6 (six) hours as needed (Nausea or vomiting).   Facility-Administered Encounter Medications as of 09/12/2018  Medication  . sodium chloride flush (NS) 0.9 % injection 10 mL     ONCOLOGIC FAMILY HISTORY:  Family History  Problem Relation Age of Onset  . Hypertension Father   . Diabetes Father   . Other Father        stomach mass suspicious for ca, never bx  . Coronary artery disease Mother   . Kidney cancer Mother 29  . Coronary artery disease Maternal Grandfather   . Colon cancer Maternal Grandfather 61       recurrence  . Coronary artery disease Maternal Uncle   . Coronary artery disease Maternal Uncle   . Diabetes Maternal Uncle   . Colon cancer Maternal Grandmother 43  . Breast cancer Cousin 15       had GT- CHEK2+  . Cervical cancer Maternal Aunt 35  . Diabetes Maternal Aunt   .  Alzheimer's disease Paternal Grandmother   . Colon cancer Paternal Grandfather 21  . Breast cancer Paternal Aunt   . Breast cancer Paternal Aunt   . Breast cancer Other 45     GENETIC COUNSELING/TESTING: See above  SOCIAL HISTORY:  Social History   Socioeconomic History  . Marital status: Married    Spouse name: Not on file  . Number of children: Not on file  . Years of education: Not on file  . Highest education level: Not on file  Occupational History  . Not on file  Social Needs  . Financial resource strain: Not on file  . Food insecurity:    Worry: Not on file    Inability: Not on file  .  Transportation needs:    Medical: Not on file    Non-medical: Not on file  Tobacco Use  . Smoking status: Never Smoker  . Smokeless tobacco: Never Used  Substance and Sexual Activity  . Alcohol use: Yes    Comment: Occasionally drinks wine  . Drug use: No  . Sexual activity: Not on file  Lifestyle  . Physical activity:    Days per week: Not on file    Minutes per session: Not on file  . Stress: Not on file  Relationships  . Social connections:    Talks on phone: Not on file    Gets together: Not on file    Attends religious service: Not on file    Active member of club or organization: Not on file    Attends meetings of clubs or organizations: Not on file    Relationship status: Not on file  . Intimate partner violence:    Fear of current or ex partner: Not on file    Emotionally abused: Not on file    Physically abused: Not on file    Forced sexual activity: Not on file  Other Topics Concern  . Not on file  Social History Narrative  . Not on file     OBSERVATIONS/OBJECTIVE:   LABORATORY DATA:  None for this visit.  DIAGNOSTIC IMAGING:  None for this visit.      ASSESSMENT AND PLAN:  Ms.. Mckeen is a pleasant 53 y.o. female with Stage IIB right breast invasive ductal carcinoma, ER-/PR-/HER2-, diagnosed in 02/2017, treated with neoadjuvant chemotherapy and  bilateral mastectomies.  She presents to the Survivorship Clinic for our initial meeting and routine follow-up post-completion of treatment for breast cancer.    1. Stage IIB right breast cancer:  Ms. Woodbury is continuing to recover from definitive treatment for breast cancer. She will follow-up with her medical oncologist, Dr. Jana Hakim in 10/2018 with history and physical exam per surveillance protocol. Today, a comprehensive survivorship care plan and treatment summary was reviewed with the patient today detailing her breast cancer diagnosis, treatment course, potential late/long-term effects of treatment, appropriate follow-up care with recommendations for the future, and patient education resources.  A copy of this summary, along with a letter will be sent to the patient's primary care provider via mail/fax/In Basket message after today's visit.    2. Vaginal Dryness: I talked with Dr. Jana Hakim about the intrarosa.  He wants me to make sure that Cassanda knows that we don't have any data that these vaginal suppositories that contain estrogen do not cause or contribute to breast cancer, so there could be a slight risk.  IF Natlie decides to use these treatments for quality of life issues knowing this possible, slight risk, then he is supportive. I reviewed this with Raziah, who notes that she will take the samples and then decide.    3. Hot flashes: She and I reviewed non pharmacologic interventions such as avoiding caffeine, hot/spicy foods and ETOH.  I also suggested that acupuncture works for some women with hot flashes.    4. Bone health:  Given Ms. Isenberg's age/history of breast cancer and her FH of osteoporosi, she is at risk for bone demineralization.  Her last DEXA scan was this year with Dr. Nori Riis and it was normal.  She notes this is going to be done every 2 years due to her risk factors.  In the meantime, she was encouraged to increase her consumption of foods rich in calcium, as well as  increase her weight-bearing activities.  She was given education on specific activities to promote bone health.  5. Cancer screening:  Due to Ms. Madison history and her age, she should receive screening for skin cancers, colon cancer, and gynecologic cancers.  The information and recommendations are listed on the patient's comprehensive care plan/treatment summary and were reviewed in detail with the patient.    6. Health maintenance and wellness promotion: Ms. Titus was encouraged to consume 5-7 servings of fruits and vegetables per day. We reviewed the "Nutrition Rainbow" handout.  She was also encouraged to engage in moderate to vigorous exercise for 30 minutes per day most days of the week. We discussed the [employer] fitness program, which is designed for cancer survivors to help them become more physically fit after cancer treatments.  She was instructed to limit her alcohol consumption and continue to abstain from tobacco use.     7. Support services/counseling: It is not uncommon for this period of the patient's cancer care trajectory to be one of many emotions and stressors.  We discussed how this can be increasingly difficult during the times of quarantine and social distancing due to the COVID-19 pandemic.   She was given information regarding our available services and encouraged to contact me with any questions or for help enrolling in any of our support group/programs.    Follow up instructions:    -Return to cancer center in 10/2018 for f/u with Dr. Jana Hakim  -Follow up with surgery per Dr. Lucia Gaskins -She is welcome to return back to the Survivorship Clinic at any time; no additional follow-up needed at this time.  -Consider referral back to survivorship as a long-term survivor for continued surveillance  The patient was provided an opportunity to ask questions and all were answered. The patient agreed with the plan and demonstrated an understanding of the instructions.   The patient  was advised to call back or seek an in-person evaluation if the symptoms worsen or if the condition fails to improve as anticipated.   I provided 40 minutes of face-to-face time during this encounter.   Scot Dock, NP

## 2018-09-15 DIAGNOSIS — M9901 Segmental and somatic dysfunction of cervical region: Secondary | ICD-10-CM | POA: Diagnosis not present

## 2018-09-15 DIAGNOSIS — G5603 Carpal tunnel syndrome, bilateral upper limbs: Secondary | ICD-10-CM | POA: Diagnosis not present

## 2018-09-15 DIAGNOSIS — M9902 Segmental and somatic dysfunction of thoracic region: Secondary | ICD-10-CM | POA: Diagnosis not present

## 2018-09-24 ENCOUNTER — Telehealth: Payer: Self-pay

## 2018-09-24 NOTE — Telephone Encounter (Signed)
She called and left a message asking Mendel Ryder to make a referral to a neurologist.

## 2018-09-25 ENCOUNTER — Other Ambulatory Visit: Payer: Self-pay | Admitting: Adult Health

## 2018-09-25 DIAGNOSIS — R52 Pain, unspecified: Secondary | ICD-10-CM

## 2018-09-25 DIAGNOSIS — R200 Anesthesia of skin: Secondary | ICD-10-CM

## 2018-09-25 NOTE — Telephone Encounter (Signed)
Please refer to neuro for numbness and muscle achiness.  Dr. Delice Lesch

## 2018-09-29 DIAGNOSIS — G5603 Carpal tunnel syndrome, bilateral upper limbs: Secondary | ICD-10-CM | POA: Diagnosis not present

## 2018-09-29 DIAGNOSIS — M9902 Segmental and somatic dysfunction of thoracic region: Secondary | ICD-10-CM | POA: Diagnosis not present

## 2018-09-29 DIAGNOSIS — M9901 Segmental and somatic dysfunction of cervical region: Secondary | ICD-10-CM | POA: Diagnosis not present

## 2018-10-02 NOTE — Progress Notes (Signed)
New Patient Virtual Visit via Video Note The purpose of this virtual visit is to provide medical care while limiting exposure to the novel coronavirus.    Consent was obtained for video visit:  Yes.   Answered questions that patient had about telehealth interaction:  Yes.   I discussed the limitations, risks, security and privacy concerns of performing an evaluation and management service by telemedicine. I also discussed with the patient that there may be a patient responsible charge related to this service. The patient expressed understanding and agreed to proceed.  Pt location: Home Physician Location: office Name of referring provider:  Deanne Coffer* I connected with Jennifer Beck at patients initiation/request on 10/06/2018 at  2:00 PM EDT by video enabled telemedicine application and verified that I am speaking with the correct person using two identifiers. Pt MRN:  [id number] Pt DOB:  06/18/1965 Video Participants:  Jennifer Beck    History of Present Illness: Jennifer Beck is a 53 y.o. right-handed Caucasian female with right breast cancer s/p bilateral mastectomy and history of pericarditis and myocarditis presenting for evaluation of bilateral hand tingling.   Starting around June 2019, she began having numbness of the hands, which would often wake her up from sleeping. When she wakes up in the morning, her wrist and elbow feels sore.   She has noticed that it is triggered when talking on the phone for prolonged periods of time.  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  She was evaluated by Dr. Amedeo Plenty who suggested she may have carpal tunnel syndrome.  She had cortisone injections, which provided some relief. She has been sleeping with bilateral wrist splints, however has not noticed any marked benefit.  She was also given elbow brace to wear at nighttime for possible ulnar nerve entrapment, again, no significant improvement.  Out-side  paper records, electronic medical record, and images have been reviewed where available and summarized: Lab Results  Component Value Date   TSH 1.15 02/05/2012    Past Medical History:  Diagnosis Date  . Arthritis    leg and muscle pains  . Chest pain   . Complication of anesthesia   . Endometriosis   . Family history of breast cancer 04/22/2017  . Family history of colon cancer 04/22/2017  . Family history of kidney cancer 04/22/2017  . Fatigue   . GERD (gastroesophageal reflux disease)   . GI bleed    caused by ischemic colitis following an episode of hypertension  . Heart palpitations   . Hx of echocardiogram    a. echo 9/13:  EF 55-60%, Gr 2 diast dysfn  . Hypercholesterolemia   . Ischemic colitis ([place])   . Myocarditis ([place])   . Pericarditis   . PONV (postoperative nausea and vomiting)   . Restrictive pericarditis 2004  . SOB (shortness of breath)     Past Surgical History:  Procedure Laterality Date  . BREAST RECONSTRUCTION WITH PLACEMENT OF TISSUE EXPANDER AND ALLODERM Bilateral 10/08/2017   Procedure: BREAST RECONSTRUCTION WITH PLACEMENT OF TISSUE EXPANDER AND ALLODERM;  Surgeon: [name], MD;  Location: [facility];  Service: Plastics;  Laterality: Bilateral;  . CARDIAC CATHETERIZATION  08/26/2001   EF of 60% --- smooth & normal coronary arteries -- there is a Breach motion defect consistent with posterolateral MI -- she quite possibly had a coronary spasm -- she may have some pericarditis secondary to her MI   . DILATION AND CURETTAGE OF  UTERUS  10/15/2003   Uterine enlargement, menorrhagia  . DILATION AND CURETTAGE OF UTERUS  05/27/2002   Dysfunctional uterine bleeding  . LAPAROSCOPY    . LIPOSUCTION WITH LIPOFILLING Bilateral 01/17/2018   Procedure: LIPOFILLING FROM ABDOMEN TO CHEST;  Surgeon: [name], MD;  Location: [facility];  Service: Plastics;  Laterality: Bilateral;  . MASTECTOMY W/ SENTINEL NODE BIOPSY  Bilateral 10/08/2017   Procedure: RIGHT MASTECTOMY WITH RIGHT AXILLARY SENTINEL LYMPH NODE BIOPSY AND LEFT PROPHYLACTIC MASTECTOMY;  Surgeon: [name], MD;  Location: [facility];  Service: General;  Laterality: Bilateral;  . NOVASURE ABLATION  10/15/2003   for management of her extreme menorrhagi  . PORTACATH PLACEMENT Right 04/02/2017   Procedure: INSERTION PORT-A-CATH;  Surgeon: [name], MD;  Location: [facility];  Service: General;  Laterality: Right;  . REMOVAL OF BILATERAL TISSUE EXPANDERS WITH PLACEMENT OF BILATERAL BREAST IMPLANTS Bilateral 01/17/2018   Procedure: REMOVAL OF BILATERAL TISSUE EXPANDERS WITH PLACEMENT OF BILATERAL SILICONE BREAST IMPLANTS;  Surgeon: [name], MD;  Location: [facility];  Service: Plastics;  Laterality: Bilateral;  . TUBAL LIGATION  09/2000   bilateral     Medications:  Outpatient Encounter Medications as of 10/06/2018  Medication Sig  . acetaminophen (TYLENOL) 325 MG tablet Take 650 mg by mouth every 6 (six) hours as needed.  . Cholecalciferol (VITAMIN D) 2000 units CAPS Take 2,000 Units by mouth daily.  [name] glucosamine-chondroitin 500-400 MG tablet Take 1 tablet by mouth daily.   [name] ibuprofen (ADVIL,MOTRIN) 200 MG tablet Take 400 mg by mouth every 8 (eight) hours as needed for headache or mild pain.   . INTRAROSA 6.5 MG INST I 1 VAGINAL I VAGINALLY QD HS  . Multiple Minerals-Vitamins (CALCIUM-MAGNESIUM-ZINC) TABS Take 1 tablet by mouth daily.   . Multiple Vitamins-Minerals (MULTIVITAMINS THER. W/MINERALS) TABS Take 1 tablet by mouth daily.   . Nutritional Supplements (ESTROVEN PO) Take by mouth daily.  Vladimir Faster Glycol-Propyl Glycol (SYSTANE OP) Apply 1 drop to eye 2 (two) times daily.  . [DISCONTINUED] Estradiol 10 MCG TABS vaginal tablet INSERT 1 TABLET VAGINALLY 2 TIMES A WEEK FOR 4 WEEKS. MAY DECREASE TO WEEKLY  . [DISCONTINUED] prochlorperazine (COMPAZINE) 10 MG tablet Take 1 tablet (10 mg total) by mouth  every 6 (six) hours as needed (Nausea or vomiting).   Facility-Administered Encounter Medications as of 10/06/2018  Medication  . sodium chloride flush (NS) 0.9 % injection 10 mL    Allergies:  Allergies  Allergen Reactions  . Codeine Itching  . Biaxin [Clarithromycin]     Heart Burn    Family History: Family History  Problem Relation Age of Onset  . Hypertension Father   . Diabetes Father   . Other Father        stomach mass suspicious for ca, never bx  . Coronary artery disease Mother   . Kidney cancer Mother 18  . Coronary artery disease Maternal Grandfather   . Colon cancer Maternal Grandfather 61       recurrence  . Coronary artery disease Maternal Uncle   . Coronary artery disease Maternal Uncle   . Diabetes Maternal Uncle   . Colon cancer Maternal Grandmother 34  . Breast cancer Cousin 68       had GT- CHEK2+  . Cervical cancer Maternal Aunt 35  . Diabetes Maternal Aunt   . Alzheimer's disease Paternal Grandmother   . Colon cancer Paternal Grandfather 55  . Breast cancer Paternal Aunt   . Breast  cancer Paternal Aunt   . Breast cancer Other 103    Social History: Social History   Tobacco Use  . Smoking status: Never Smoker  . Smokeless tobacco: Never Used  Substance Use Topics  . Alcohol use: Yes    Comment: Occasionally drinks wine  . Drug use: No   Social History   Social History Narrative   She works as Medical illustrator.   She lives at home with husband.  They have 1 daughter   Highest level of education: associates    Review of Systems:  CONSTITUTIONAL: No fevers, chills, night sweats, or weight loss.   EYES: No visual changes or eye pain ENT: No hearing changes.  No history of nose bleeds.   RESPIRATORY: No cough, wheezing and shortness of breath.   CARDIOVASCULAR: Negative for chest pain, and palpitations.   GI: Negative for abdominal discomfort, blood in stools or black stools.  No recent change in bowel habits.   GU:  No history of  incontinence.   MUSCLOSKELETAL: No history of joint pain or swelling.  No myalgias.   SKIN: Negative for lesions, rash, and itching.   HEMATOLOGY/ONCOLOGY: Negative for prolonged bleeding, bruising easily, and swollen nodes.  No history of cancer.   ENDOCRINE: Negative for cold or heat intolerance, polydipsia or goiter.   PSYCH:  No depression or anxiety symptoms.   NEURO: As Above.   Vital Signs:  Ht 5' 5.5" (1.664 m)   Wt 155 lb (70.3 kg)   BMI 25.40 kg/m    General Medical Exam:  Well appearing, comfortable.  Nonlabored breathing.  No deformity or edema.   Neurological Exam: MENTAL STATUS including orientation to time, place, person, recent and remote memory, attention span and concentration, language, and fund of knowledge is normal.  Speech is not dysarthric.  CRANIAL NERVES:  Normal conjugate, extra-ocular eye movements in all directions of gaze.  No ptosis.  Normal facial symmetry and movements.  Normal shoulder shrug and head rotation.  Tongue is midline.  MOTOR:  Antigravity in all extremities.  No abnormal movements.  No pronator drift.   SENSORY: Negative Prayer and Phalen testing.   COORDINATION/GAIT: Normal finger to nose bilaterally.  Intact rapid alternating movements bilaterally.  Able to rise from a chair without using arms.  Gait narrow based and stable.   IMPRESSION: Bilateral hand paresthesia, probable overlap of nerve entrapment at the elbow and wrist  - NCS/EMG of bilateral arms to better localize symptoms  - Recommend wearing bilateral wrist splints and using elbow brace  - She did not tolerate gabapentin and does not wish to be on alternative medication, such as nortriptyline  - She had many questions which were answered to the best of my ability.  Reassured patient that she symptoms are not suggestive of polyneuropathy or brachial plexopathy   Follow Up Instructions:  I discussed the assessment and treatment plan with the patient. The patient was  provided an opportunity to ask questions and all were answered. The patient agreed with the plan and demonstrated an understanding of the instructions.   The patient was advised to call back or seek an in-person evaluation if the symptoms worsen or if the condition fails to improve as anticipated.  Return to clinic after testing   Alda Berthold, DO

## 2018-10-03 ENCOUNTER — Telehealth: Payer: Self-pay | Admitting: Neurology

## 2018-10-03 NOTE — Telephone Encounter (Signed)
Called patient back and left message for her to call me back.

## 2018-10-03 NOTE — Telephone Encounter (Signed)
Patient left msg with after hours and said that she was returning Jennifer Beck's call .Thanks!

## 2018-10-06 ENCOUNTER — Other Ambulatory Visit: Payer: Self-pay

## 2018-10-06 ENCOUNTER — Encounter: Payer: Self-pay | Admitting: *Deleted

## 2018-10-06 ENCOUNTER — Other Ambulatory Visit: Payer: Self-pay | Admitting: *Deleted

## 2018-10-06 ENCOUNTER — Telehealth (INDEPENDENT_AMBULATORY_CARE_PROVIDER_SITE_OTHER): Payer: 59 | Admitting: Neurology

## 2018-10-06 ENCOUNTER — Encounter: Payer: Self-pay | Admitting: Neurology

## 2018-10-06 VITALS — Ht 65.5 in | Wt 155.0 lb

## 2018-10-06 DIAGNOSIS — M79602 Pain in left arm: Secondary | ICD-10-CM

## 2018-10-06 DIAGNOSIS — R202 Paresthesia of skin: Secondary | ICD-10-CM | POA: Diagnosis not present

## 2018-10-06 DIAGNOSIS — M79601 Pain in right arm: Secondary | ICD-10-CM

## 2018-10-06 NOTE — Progress Notes (Signed)
Order placed

## 2018-10-10 DIAGNOSIS — G5603 Carpal tunnel syndrome, bilateral upper limbs: Secondary | ICD-10-CM | POA: Diagnosis not present

## 2018-10-10 DIAGNOSIS — M9902 Segmental and somatic dysfunction of thoracic region: Secondary | ICD-10-CM | POA: Diagnosis not present

## 2018-10-10 DIAGNOSIS — M9901 Segmental and somatic dysfunction of cervical region: Secondary | ICD-10-CM | POA: Diagnosis not present

## 2018-10-16 DIAGNOSIS — G5603 Carpal tunnel syndrome, bilateral upper limbs: Secondary | ICD-10-CM | POA: Diagnosis not present

## 2018-10-16 DIAGNOSIS — M9902 Segmental and somatic dysfunction of thoracic region: Secondary | ICD-10-CM | POA: Diagnosis not present

## 2018-10-16 DIAGNOSIS — M9901 Segmental and somatic dysfunction of cervical region: Secondary | ICD-10-CM | POA: Diagnosis not present

## 2018-10-28 ENCOUNTER — Ambulatory Visit (INDEPENDENT_AMBULATORY_CARE_PROVIDER_SITE_OTHER): Payer: 59 | Admitting: Neurology

## 2018-10-28 ENCOUNTER — Encounter: Payer: Self-pay | Admitting: Neurology

## 2018-10-28 ENCOUNTER — Other Ambulatory Visit: Payer: Self-pay

## 2018-10-28 VITALS — BP 110/64 | HR 83 | Temp 98.1°F | Wt 158.0 lb

## 2018-10-28 VITALS — BP 110/64 | HR 83 | Temp 98.1°F | Ht 65.0 in | Wt 158.0 lb

## 2018-10-28 DIAGNOSIS — G5603 Carpal tunnel syndrome, bilateral upper limbs: Secondary | ICD-10-CM

## 2018-10-28 DIAGNOSIS — R202 Paresthesia of skin: Secondary | ICD-10-CM | POA: Diagnosis not present

## 2018-10-28 DIAGNOSIS — M79602 Pain in left arm: Secondary | ICD-10-CM | POA: Diagnosis not present

## 2018-10-28 DIAGNOSIS — M79601 Pain in right arm: Secondary | ICD-10-CM

## 2018-10-28 NOTE — Patient Instructions (Signed)
We will refer you to [facility] for occupational therapy  Follow-up with Dr. Amedeo Plenty for your hand pain

## 2018-10-28 NOTE — Procedures (Signed)
[facility]  [address], [place]  [place],  [address] Tel: [phone] Fax:  [phone] Test Date:  10/28/2018  Patient: Jennifer Beck DOB: 21-Jun-1965 Physician: Narda Amber, DO  Sex: Female Height: 5\' 5"  Ref Phys: Narda Amber, DO  ID#: [id number] Temp: 36.0C Technician:    Patient Complaints: This is a 53 year old female referred for evaluation of bilateral hand paresthesias and pain.  NCV & EMG Findings: Extensive electrodiagnostic testing of the right upper extremity and additional studies of the left shows:  1. Right mixed palmar sensory responses show mildly prolonged latency.  Left median sensory response shows prolonged latency (3.8 ms).  Bilateral ulnar and right median sensory responses are within normal limits.   2. Bilateral median and ulnar motor responses are within normal limits.   3. There is no evidence of active or chronic motor axonal loss changes affecting any of the tested muscles.  Motor unit configuration and recruitment pattern is within normal limits.    Impression: 1. Left median neuropathy at or distal to the wrist (mild), consistent with a clinical diagnosis of carpal tunnel syndrome.   2. Right median neuropathy at or distal to the wrist (very mild), consistent with a clinical diagnosis of carpal tunnel syndrome.     ___________________________ Narda Amber, DO    Nerve Conduction Studies Anti Sensory Summary Table   Site NR Peak (ms) Norm Peak (ms) P-T Amp (V) Norm P-T Amp  Left Median Anti Sensory (2nd Digit)  36C  Wrist    3.8 <3.6 26.6 >15  Right Median Anti Sensory (2nd Digit)  36C  Wrist    3.2 <3.6 23.1 >15  Left Ulnar Anti Sensory (5th Digit)  36C  Wrist    2.5 <3.1 32.7 >10  Right Ulnar Anti Sensory (5th Digit)  36C  Wrist    2.5 <3.1 29.2 >10   Motor Summary Table   Site NR Onset (ms) Norm Onset (ms) O-P Amp (mV) Norm O-P Amp Site1 Site2 Delta-0 (ms) Dist (cm) Vel (m/s) Norm Vel (m/s)  Left Median  Motor (Abd Poll Brev)  36C  Wrist    3.4 <4.0 10.6 >6 Elbow Wrist 4.6 26.0 57 >50  Elbow    8.0  10.3         Right Median Motor (Abd Poll Brev)  36C  Wrist    3.0 <4.0 11.1 >6 Elbow Wrist 4.3 25.0 58 >50  Elbow    7.3  10.5         Left Ulnar Motor (Abd Dig Minimi)  36C  Wrist    2.0 <3.1 9.0 >7 B Elbow Wrist 3.5 22.0 63 >50  B Elbow    5.5  8.9  A Elbow B Elbow 1.6 10.0 63 >50  A Elbow    7.1  8.4         Right Ulnar Motor (Abd Dig Minimi)  36C  Wrist    1.9 <3.1 9.9 >7 B Elbow Wrist 3.5 22.0 63 >50  B Elbow    5.4  9.2  A Elbow B Elbow 1.6 10.0 63 >50  A Elbow    7.0  9.2          Comparison Summary Table   Site NR Peak (ms) Norm Peak (ms) P-T Amp (V) Site1 Site2 Delta-P (ms) Norm Delta (ms)  Right Median/Ulnar Palm Comparison (Wrist - 8cm)  36C  Median Palm    1.9 <2.2 30.7 Median Palm Ulnar Palm 0.5   Ulnar [employer]  1.4 <2.2 18.7       EMG   Side Muscle Ins Act Fibs Psw Fasc Number Recrt Dur Dur. Amp Amp. Poly Poly. Comment  Right 1stDorInt Nml Nml Nml Nml Nml Nml Nml Nml Nml Nml Nml Nml N/A  Right Abd Poll Brev Nml Nml Nml Nml Nml Nml Nml Nml Nml Nml Nml Nml N/A  Right PronatorTeres Nml Nml Nml Nml Nml Nml Nml Nml Nml Nml Nml Nml N/A  Right Biceps Nml Nml Nml Nml Nml Nml Nml Nml Nml Nml Nml Nml N/A  Right Triceps Nml Nml Nml Nml Nml Nml Nml Nml Nml Nml Nml Nml N/A  Right Deltoid Nml Nml Nml Nml Nml Nml Nml Nml Nml Nml Nml Nml N/A  Left 1stDorInt Nml Nml Nml Nml Nml Nml Nml Nml Nml Nml Nml Nml N/A  Left Abd Poll Brev Nml Nml Nml Nml Nml Nml Nml Nml Nml Nml Nml Nml N/A  Left PronatorTeres Nml Nml Nml Nml Nml Nml Nml Nml Nml Nml Nml Nml N/A  Left Biceps Nml Nml Nml Nml Nml Nml Nml Nml Nml Nml Nml Nml N/A  Left Triceps Nml Nml Nml Nml Nml Nml Nml Nml Nml Nml Nml Nml N/A  Left Deltoid Nml Nml Nml Nml Nml Nml Nml Nml Nml Nml Nml Nml N/A      Waveforms:

## 2018-10-28 NOTE — Progress Notes (Signed)
Follow-up Visit   Date: 10/28/18   Jennifer Beck MRN: [id number] DOB: 01/26/1966   Interim History: Jennifer Beck is a 53 y.o. right-handed female with right breast cancer s/p bilateral mastectomy and history of pericarditis and myocarditis returning to the clinic for follow-up of bilateral hand paresthesias.  The patient was accompanied to the clinic by self.  She has one year history of bilateral hand paresthesias, no benefit with wrist splints or steroid injections at there wrist.  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  She was evaluated by Dr. Amedeo Plenty who suggested she may have carpal tunnel syndrome.  She also complains of hands falling asleep when she is on the phone and has been wearing elbow brace for support, again with no improvement.  Gabapentin was tried in the past, but she developed side effects and has decided to stay off medications for now.  Today, she also complains of achy pain in the wrists.  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  She is here for EDX.   Medications:  Current Outpatient Medications on File Prior to Visit  Medication Sig Dispense Refill   acetaminophen (TYLENOL) 325 MG tablet Take 650 mg by mouth every 6 (six) hours as needed.     Cholecalciferol (VITAMIN D) 2000 units CAPS Take 2,000 Units by mouth daily.     glucosamine-chondroitin 500-400 MG tablet Take 1 tablet by mouth daily.      ibuprofen (ADVIL,MOTRIN) 200 MG tablet Take 400 mg by mouth every 8 (eight) hours as needed for headache or mild pain.      INTRAROSA 6.5 MG INST I 1 VAGINAL I VAGINALLY QD HS     Multiple Minerals-Vitamins (CALCIUM-MAGNESIUM-ZINC) TABS Take 1 tablet by mouth daily.      Multiple Vitamins-Minerals (MULTIVITAMINS THER. W/MINERALS) TABS Take 1 tablet by mouth daily.      Nutritional Supplements (ESTROVEN PO) Take by mouth daily.      Polyethyl Glycol-Propyl Glycol (SYSTANE OP) Apply 1 drop to eye 2 (two) times daily.     [DISCONTINUED] prochlorperazine (COMPAZINE) 10 MG tablet Take 1 tablet (10 mg total) by mouth every 6 (six) hours as needed (Nausea or vomiting). 30 tablet 1   Current Facility-Administered Medications on File Prior to Visit  Medication Dose Route Frequency Provider Last Rate Last Dose   sodium chloride flush (NS) 0.9 % injection 10 mL  10 mL Intracatheter PRN Magrinat, Virgie Dad, MD   10 mL at 07/04/17 1749    Allergies:  Allergies  Allergen Reactions   Codeine Itching   Biaxin [Clarithromycin]     Heart Burn    Review of Systems:  CONSTITUTIONAL: No fevers, chills, night sweats, or weight loss.  EYES: No visual changes or eye pain ENT: No hearing changes.  No history of nose bleeds.   RESPIRATORY: No cough, wheezing and shortness of breath.   CARDIOVASCULAR: Negative for chest pain, and palpitations.   GI: Negative for abdominal discomfort, blood in stools or black stools.  No recent change in bowel habits.   GU:  No history of incontinence.   MUSCLOSKELETAL: +history of joint pain or swelling.  No myalgias.   SKIN: Negative for lesions, rash, and itching.   ENDOCRINE: Negative for cold or heat intolerance, polydipsia or goiter.   PSYCH:  No depression +anxiety symptoms.   NEURO: As Above.   Vital  Signs:  BP 110/64    Pulse 83    Temp 98.1 F (36.7 C)    Wt 158 lb (71.7 kg)    SpO2 98%    BMI 25.89 kg/m    Neurological Exam: MENTAL STATUS including orientation to time, place, person, recent and remote memory, attention span and concentration, language, and fund of knowledge is normal.  Speech is not dysarthric.  CRANIAL NERVES:  No visual field defects.  Pupils equal round and reactive to light.  Normal conjugate, extra-ocular eye movements in all directions of gaze.  No ptosis.  Face is symmetric. Palate elevates symmetrically.  Tongue is midline.  MOTOR:  Motor strength is 5/5  in all extremities.  No atrophy, fasciculations or abnormal movements.  No pronator drift.  Tone is normal.    MSRs:  Reflexes are 2+/4 throughout.  SENSORY:  Intact to vibration and temperature throughout.  COORDINATION/GAIT:  Normal finger-to- nose-finger.  Intact rapid alternating movements bilaterally.  Gait narrow based and stable.   Data: NCS/EMG of the arms 10/28/2018:   1. Left median neuropathy at or distal to the wrist (mild), consistent with a clinical diagnosis of carpal tunnel syndrome.   2. Right median neuropathy at or distal to the wrist (very mild), consistent with a clinical diagnosis of carpal tunnel syndrome.     IMPRESSION/PLAN: Bilateral carpal tunnel syndrome, mild on the left and very mild on the right.  Patient had many questions regarding management including option and recovery for carpal tunnel release.  I informed patient that based upon the very mild nature of her carpal tunnel syndrome, I do not recommend surgery.  Instead, continue to wear wrist splints and avoid hyperflexion of the wrists.  I will also refer her to occupational therapy to see if this can provide some relief.  Patient was reassured that there is no evidence of an ulnar neuropathy, brachial plexopathy, or cervical radiculopathy.  These results will be shared with Dr. Amedeo Plenty, who she has seen in the past for CTS injections.    I recommend that she establish care with primary care doctor to further evaluate her polyarthralgias.  Greater than 50% of this 25 minute visit was spent in counseling, explanation of diagnosis, planning of further management, and coordination of care.   Thank you for allowing me to participate in patient's care.  If I can answer any additional questions, I would be pleased to do so.    Sincerely,    Harmonee Tozer K. Posey Pronto, DO

## 2018-10-29 NOTE — Addendum Note (Signed)
Addended by: Amado Coe on: 10/29/2018 08:12 AM   Modules accepted: Orders

## 2018-10-31 ENCOUNTER — Other Ambulatory Visit: Payer: Self-pay | Admitting: Oncology

## 2018-11-10 NOTE — Progress Notes (Signed)
[place]  [phone] [phone]     ID: Jennifer Beck DOB: [birth year]  MR#: [id number]  WPY#:[id number]  Patient Care Team: [name], MD as PCP - General (Internal Medicine) [name], MD as Consulting Physician (Dermatology) [name], [place] as Referring Physician (Chiropractic Medicine) [name], MD as Consulting Physician (General Surgery) [name], MD as Attending Physician (Radiation Oncology) [name], MD as Consulting Physician (Obstetrics and Gynecology) Maybelline Kolarik, Virgie Dad, MD as Consulting Physician (Oncology) [name], MD as Consulting Physician (Plastic Surgery) Alda Berthold, DO as Consulting Physician (Neurology) OTHER MD:  CHIEF COMPLAINT: Triple negative breast cancer (s/p bilateral mastectomies)  CURRENT TREATMENT: Observation    INTERVAL HISTORY: Jennifer Beck returns today for follow-up of her triple negative breast cancer. She continues under observation.   Since her last visit, she has not undergone any additional studies.   REVIEW OF SYSTEMS:  Jennifer Beck reports some mild carpal tunnel to both hands. She states she underwent nerve studies under Dr. Posey Pronto, which also showed some tendonitis in her hands. She is undergoing occupational hand therapy for this. She is working from home because of the virus. She walks about a mile a day; she walks 3000 steps in 30 minutes around her neighborhood. She states she is planning to get nipple tattoos in July. She reports she tried to take intrarosa, but she experienced side effects including itching. She opted to continue on estradiol. A detailed review of systems was otherwise entirely negative.   HISTORY OF CURRENT ILLNESS:  From the original intake note:  Jennifer Beck palpated a mass in her right breast sometime in September 2018.[name] She brought this to medical attention and her PCP, [name], set her up on 03/18/2017 for a  unilateral right diagnostic mammography with ultrasonography, showing: Breast density D. The palpable right breast mass at 12:30 was indeterminate, and biopsy was warranted at that time. No evidence of right axillary lymphadenopathy was noted. Biopsy of the lesion in question on 03/21/2017 at the right breast 12:30 position showed ([id number])  invasive ductal carcinoma with lymphovascular invasion.  The tumor was grade 3, triple negative, with an MIB-1 of 80%.  She is accompanied by her husband, [name] to the office today. She reports that she is doing well overall. She initially felt a lump in September in the shower and notes that she doesn't complete monthly self breast exams. She had a routine mammogram in March 2018 at [facility] and she is followed by Dr. [name].  As far as surgeries, she has had two laparoscopic procedures for endometriosis as well as a bilateral tubal ligation. She still has occasional cramping, painful ovulation, and vaginal spotting that comes and goes. She has had cyst to her breast before that have been evaluated and ruled as negative. She notes that she still has her tonsils, gallbladder, and appendix. She has a prior hx of pericarditis in 2003 that she notes was brought onby a virus. She has since been cleared by her prior Cardiologist.   She has a Restaurant manager, fast food, [name] that she sees every 5 weeks for maintenance of her neck and back issues. She has a dermatologist, [name] at [facility] and she has had an biopsy of an area from her left chest that resulted as basal cell carcinoma in 2015. She denies having a GI specialist, Cardiologist, or Orthopedist at this time. She denies prior history of seizures, migraines, acid reflux, asthma, emphysema, palpitations or GI  issues.   [name] is as detailed below.   PAST MEDICAL HISTORY: Past Medical History:  Diagnosis Date   Arthritis    leg and muscle pains     Chest pain    Complication of anesthesia    Endometriosis    Family history of breast cancer 04/22/2017   Family history of colon cancer 04/22/2017   Family history of kidney cancer 04/22/2017   Fatigue    GERD (gastroesophageal reflux disease)    GI bleed    caused by ischemic colitis following an episode of hypertension   Heart palpitations    Hx of echocardiogram    a. echo 9/13:  EF 55-60%, Gr 2 diast dysfn   Hypercholesterolemia    Ischemic colitis ([place])    Myocarditis ([place])    Pericarditis    PONV (postoperative nausea and vomiting)    Restrictive pericarditis 2004   SOB (shortness of breath)     PAST SURGICAL HISTORY: Past Surgical History:  Procedure Laterality Date   BREAST RECONSTRUCTION WITH PLACEMENT OF TISSUE EXPANDER AND ALLODERM Bilateral 10/08/2017   Procedure: BREAST RECONSTRUCTION WITH PLACEMENT OF TISSUE EXPANDER AND ALLODERM;  Surgeon: [name], MD;  Location: [place];  Service: Plastics;  Laterality: Bilateral;   CARDIAC CATHETERIZATION  08/26/2001   EF of 60% --- smooth & normal coronary arteries -- there is a [place] motion defect consistent with posterolateral MI -- she quite possibly had a coronary spasm -- she may have some pericarditis secondary to her MI    DILATION AND CURETTAGE OF UTERUS  10/15/2003   Uterine enlargement, menorrhagia   DILATION AND CURETTAGE OF UTERUS  05/27/2002   Dysfunctional uterine bleeding   LAPAROSCOPY     LIPOSUCTION WITH LIPOFILLING Bilateral 01/17/2018   Procedure: LIPOFILLING FROM ABDOMEN TO CHEST;  Surgeon: [name], MD;  Location: [place];  Service: Plastics;  Laterality: Bilateral;   MASTECTOMY W/ SENTINEL NODE BIOPSY Bilateral 10/08/2017   Procedure: RIGHT MASTECTOMY WITH RIGHT AXILLARY SENTINEL LYMPH NODE BIOPSY AND LEFT PROPHYLACTIC MASTECTOMY;  Surgeon: [name], MD;  Location: [place];  Service: General;  Laterality:  Bilateral;   NOVASURE ABLATION  10/15/2003   for management of her extreme menorrhagi   PORTACATH PLACEMENT Right 04/02/2017   Procedure: INSERTION PORT-A-CATH;  Surgeon: [name], MD;  Location: [facility];  Service: General;  Laterality: Right;   REMOVAL OF BILATERAL TISSUE EXPANDERS WITH PLACEMENT OF BILATERAL BREAST IMPLANTS Bilateral 01/17/2018   Procedure: REMOVAL OF BILATERAL TISSUE EXPANDERS WITH PLACEMENT OF BILATERAL SILICONE BREAST IMPLANTS;  Surgeon: [name], MD;  Location: [place];  Service: Plastics;  Laterality: Bilateral;   TUBAL LIGATION  09/2000   bilateral    FAMILY HISTORY Family History  Problem Relation Age of Onset   Hypertension Father    Diabetes Father    Other Father        stomach mass suspicious for ca, never bx   Coronary artery disease Mother    Kidney cancer Mother 73   Coronary artery disease Maternal Grandfather    Colon cancer Maternal Grandfather 61       recurrence   Coronary artery disease Maternal Uncle    Coronary artery disease Maternal Uncle    Diabetes Maternal Uncle    Colon cancer Maternal Grandmother 58   Breast cancer Cousin 61       had GT- CHEK2+   Cervical cancer Maternal Aunt 35   Diabetes Maternal Aunt  Alzheimer's disease Paternal Grandmother    Colon cancer Paternal Grandfather 25   Breast cancer Paternal Aunt    Breast cancer Paternal Aunt    Breast cancer Other 5   Her father died 02/25/16 at age 46 just 81 week shy of his 83rd birthday. Her mother is 23 years old as of October 2018. Patient has one brother and no sisters. Her mother was diagnosed with kidney cancer at age 37 with a nephrectomy following. Her maternal grandmother was diagnosed with colon cancer at age 50.  The patient paternal grandfather was diagnosed with colon cancer in his 54's. She has paternal cousins through her fathers half-sisters that have been diagnosed with breast cancer, she is unsure of  the number of breast cancer occurrences due to the distance . A maternal cousin was diagnosed with breast cancer at age 43 and she had genetic testing. The patient does have a copy of this testing as well as her PCP.  This was not available for review on the nasal   GYNECOLOGIC HISTORY:  No LMP recorded. Patient is postmenopausal.  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 Her husband [name] is a Airline pilot. Her daughter Jennifer Beck is 108 and currently a sophomore at [employer]. 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  The patient attends [employer]. She lives in [place].     ADVANCED DIRECTIVES:    HEALTH MAINTENANCE: Social History   Tobacco Use   Smoking status: Never Smoker   Smokeless tobacco: Never Used  Substance Use Topics   Alcohol use: Yes    Comment: Occasionally drinks wine   Drug use: No     Colonoscopy: December 2019 ([facility])  PAP: March 2018   Bone density: N/A   Allergies  Allergen Reactions   Codeine Itching   Biaxin [Clarithromycin]     Heart Burn    Current Outpatient Medications  Medication Sig Dispense Refill   acetaminophen (TYLENOL) 325 MG tablet Take 650 mg by mouth every 6 (six) hours as needed.     Cholecalciferol (VITAMIN D) 2000 units CAPS Take 2,000 Units by mouth daily.     Estradiol 10 MCG TABS vaginal tablet INSERT 1 TABLET VAGINALLY 2 TIMES A WEEK FOR 4 WEEKS. MAY DECREASE TO WEEKLY 8 tablet 0   glucosamine-chondroitin 500-400 MG tablet Take 1 tablet by mouth daily.      ibuprofen (ADVIL,MOTRIN) 200 MG tablet Take 400 mg by mouth every 8 (eight) hours as needed for headache or mild pain.      INTRAROSA 6.5 MG INST I 1 VAGINAL I  VAGINALLY QD HS     Multiple Minerals-Vitamins (CALCIUM-MAGNESIUM-ZINC) TABS Take 1 tablet by mouth daily.      Multiple Vitamins-Minerals (MULTIVITAMINS THER. W/MINERALS) TABS Take 1 tablet by mouth daily.      Nutritional Supplements (ESTROVEN PO) Take by mouth daily.     Polyethyl Glycol-Propyl Glycol (SYSTANE OP) Apply 1 drop to eye 2 (two) times daily.     No current facility-administered medications for this visit.    Facility-Administered Medications Ordered in Other Visits  Medication Dose Route Frequency Provider Last Rate Last Dose   sodium chloride flush (NS) 0.9 %  injection 10 mL  10 mL Intracatheter PRN Miku Udall, Virgie Dad, MD   10 mL at 07/04/17 1749    OBJECTIVE: Young white woman in no acute distress Vitals:   11/11/18 1113  BP: (!) 107/48  Pulse: 71  Resp: 18  Temp: 98.3 F (36.8 C)  SpO2: 100%     Body mass index is 26.21 kg/m.   Wt Readings from Last 3 Encounters:  11/11/18 157 lb 8 oz (71.4 kg)  10/28/18 158 lb (71.7 kg)  10/28/18 158 lb (71.7 kg)   ECOG FS:1  Sclerae unicteric, pupils round and equal No cervical or supraclavicular adenopathy Lungs no rales or rhonchi Heart regular rate and rhythm Abd soft, nontender, positive bowel sounds MSK no focal spinal tenderness, no upper extremity lymphedema Neuro: nonfocal, well oriented, appropriate affect Breasts: Status post bilateral mastectomies with bilateral silicone implant reconstruction.  The cosmetic result is good.  There is no evidence of local recurrence.  Both axillae are benign.  LAB RESULTS:  CMP     Component Value Date/Time   NA 141 11/11/2018 1018   NA 137 05/30/2017 1219   K 4.6 11/11/2018 1018   K 3.8 05/30/2017 1219   CL 105 11/11/2018 1018   CO2 26 11/11/2018 1018   CO2 25 05/30/2017 1219   GLUCOSE 89 11/11/2018 1018   GLUCOSE 122 05/30/2017 1219   BUN 14 11/11/2018 1018   BUN 8.9 05/30/2017 1219   CREATININE 0.76 11/11/2018 1018   CREATININE 0.7 05/30/2017 1219    CALCIUM 9.7 11/11/2018 1018   CALCIUM 9.4 05/30/2017 1219   PROT 7.4 11/11/2018 1018   PROT 6.4 05/30/2017 1219   ALBUMIN 4.3 11/11/2018 1018   ALBUMIN 3.7 05/30/2017 1219   AST 18 11/11/2018 1018   AST 12 05/30/2017 1219   ALT 17 11/11/2018 1018   ALT 14 05/30/2017 1219   ALKPHOS 98 11/11/2018 1018   ALKPHOS 137 05/30/2017 1219   BILITOT 0.3 11/11/2018 1018   BILITOT 0.38 05/30/2017 1219   GFRNONAA >60 11/11/2018 1018   GFRAA >60 11/11/2018 1018    No results found for: TOTALPROTELP, ALBUMINELP, A1GS, A2GS, BETS, BETA2SER, GAMS, MSPIKE, SPEI  No results found for: KPAFRELGTCHN, LAMBDASER, KAPLAMBRATIO  Lab Results  Component Value Date   WBC 6.9 11/11/2018   NEUTROABS 3.9 11/11/2018   HGB 13.6 11/11/2018   HCT 40.4 11/11/2018   MCV 96.0 11/11/2018   PLT 299 11/11/2018    _0 @  No results found for: LABCA2  No components found for: [id number]  No results for input(s): INR in the last 168 hours.  No results found for: LABCA2  No results found for: WYO378  No results found for: HYI502  No results found for: DXA128  No results found for: CA2729  No components found for: HGQUANT  No results found for: CEA1 / No results found for: CEA1   No results found for: AFPTUMOR  No results found for: CHROMOGRNA  No results found for: PSA1  Appointment on 11/11/2018  Component Date Value Ref Range Status   Sodium 11/11/2018 141  135 - 145 mmol/L Final   Potassium 11/11/2018 4.6  3.5 - 5.1 mmol/L Final   Chloride 11/11/2018 105  98 - 111 mmol/L Final   CO2 11/11/2018 26  22 - 32 mmol/L Final   Glucose, Bld 11/11/2018 89  70 - 99 mg/dL Final   BUN 11/11/2018 14  6 - 20 mg/dL Final   Creatinine, Ser 11/11/2018 0.76  0.44 - 1.00 mg/dL Final  Calcium 11/11/2018 9.7  8.9 - 10.3 mg/dL Final   Total Protein 11/11/2018 7.4  6.5 - 8.1 g/dL Final   Albumin 11/11/2018 4.3  3.5 - 5.0 g/dL Final   AST 11/11/2018 18  15 - 41 U/L Final   ALT 11/11/2018  17  0 - 44 U/L Final   Alkaline Phosphatase 11/11/2018 98  38 - 126 U/L Final   Total Bilirubin 11/11/2018 0.3  0.3 - 1.2 mg/dL Final   GFR calc non Af Amer 11/11/2018 >60  >60 mL/min Final   GFR calc Af Amer 11/11/2018 >60  >60 mL/min Final   Anion gap 11/11/2018 10  5 - 15 Final   Performed at [facility], [place] [address], [place], [place] [address]   WBC 11/11/2018 6.9  4.0 - 10.5 K/uL Final   RBC 11/11/2018 4.21  3.87 - 5.11 MIL/uL Final   Hemoglobin 11/11/2018 13.6  12.0 - 15.0 g/dL Final   HCT 11/11/2018 40.4  36.0 - 46.0 % Final   MCV 11/11/2018 96.0  80.0 - 100.0 fL Final   MCH 11/11/2018 32.3  26.0 - 34.0 pg Final   MCHC 11/11/2018 33.7  30.0 - 36.0 g/dL Final   RDW 11/11/2018 12.1  11.5 - 15.5 % Final   Platelets 11/11/2018 299  150 - 400 K/uL Final   nRBC 11/11/2018 0.0  0.0 - 0.2 % Final   Neutrophils Relative % 11/11/2018 57  % Final   Neutro Abs 11/11/2018 3.9  1.7 - 7.7 K/uL Final   Lymphocytes Relative 11/11/2018 29  % Final   Lymphs Abs 11/11/2018 2.0  0.7 - 4.0 K/uL Final   Monocytes Relative 11/11/2018 9  % Final   Monocytes Absolute 11/11/2018 0.6  0.1 - 1.0 K/uL Final   Eosinophils Relative 11/11/2018 5  % Final   Eosinophils Absolute 11/11/2018 0.3  0.0 - 0.5 K/uL Final   Basophils Relative 11/11/2018 0  % Final   Basophils Absolute 11/11/2018 0.0  0.0 - 0.1 K/uL Final   Immature Granulocytes 11/11/2018 0  % Final   Abs Immature Granulocytes 11/11/2018 0.02  0.00 - 0.07 K/uL Final   Performed at [facility], [place] [address] [place], [place] [address]    (this displays the last labs from the last 3 days)  No results found for: TOTALPROTELP, ALBUMINELP, A1GS, A2GS, BETS, BETA2SER, GAMS, MSPIKE, SPEI (this displays SPEP labs)  No results found for: KPAFRELGTCHN, LAMBDASER, KAPLAMBRATIO (kappa/lambda light chains)  No results found for: HGBA, HGBA2QUANT, HGBFQUANT,  HGBSQUAN (Hemoglobinopathy evaluation)   No results found for: LDH  No results found for: IRON, TIBC, IRONPCTSAT (Iron and TIBC)  No results found for: FERRITIN  Urinalysis No results found for: COLORURINE, APPEARANCEUR, LABSPEC, PHURINE, GLUCOSEU, HGBUR, BILIRUBINUR, KETONESUR, PROTEINUR, UROBILINOGEN, NITRITE, LEUKOCYTESUR   STUDIES: No results found.  ELIGIBLE FOR AVAILABLE RESEARCH PROTOCOL: Enrolled in cancer disparities study   ASSESSMENT: 53 y.o. [place] woman status post right breast upper inner quadrant biopsy March 21, 2017 for a clinical T2 N0  Stage IIB invasive ductal carcinoma, grade 3, triple negative, with an MIB-1 of 80%.  (a) biopsy of 2 additional suspicious areas in the right breast and one in the left breast 04/11/2017 showed atypical lobular hyperplasia and sclerosing adenosis but no evidence of cancer  (1) neoadjuvant chemotherapy consisting of doxorubicin and cyclophosphamide in dose dense fashion x4 completed 05/23/2017, followed by paclitaxel/carboplatin weekly x12, first dose 06/06/2017, completed 08/29/2017  (2) status post bilateral mastectomies  10/08/2017, showing  (a) left breast, atypical lobular hyperplasia, no evidence of malignancy  (b) right breast, residual  pT1b pN0 invasive ductal carcinoma, grade 3, with negative margins.  A total of 2 sentinel lymph nodes were removed; repeat prognostic panel requested 10/18/2017  (c) immediate expander reconstruction with silicone implants on 03/24/2535  (3) adjuvant radiation not indicated  (4) genetics testing 04/22/2017 through the common hereditary cancer panel plus renal/urinary tract cancel panel showed no deleterious mutations in APC, ATM, AXIN2, BAP1, BARD1, BMPR1A, BRCA1, BRCA2, BRIP1, BUB1B, CDC73, CDH1, CDK4, CDKN1C, CDKN2A (p14ARF), CDKN2A (p16INK4a), CEP57, CHEK2, CTNNA1, DICER1, DIS3L2, EPCAM*, FH, FLCN, GPC3, GREM1*, KIT, MEN1, MET, MLH1,MSH2, MSH3, MSH6, MUTYH, NBN, NF1, PALB2, PDGFRA,  PMS2, POLD1, POLE, PTEN, RAD50, RAD51C, RAD51D, SDHB,SDHC, SDHD, SMAD4, SMARCA4, SMARCB1, STK11, TP53, TSC1, TSC2, VHL, WT1.The following genes were evaluated for sequence changes only: HOXB13*, MITF*, NTHL1*, SDHA  (a) there was a Variant of Uncertain Significance identified in POLD1, namely c.2953C>T (p.Arg985Trp)  (5) declined participation in S 1418 due to concerns regarding side effects  PLAN:  Selin is now a little over a year out from definitive surgery for her breast cancer with no evidence of disease recurrence.  This is very favorable.  She will be completing her reconstruction with some 3D tattoos in July.  Overall though the cosmetic result is quite good  Her labs today are terrific.  She has got a fair exercise program and I have encouraged her to improve on that  They are taking appropriate COVID precautions  She will see Dr. Lucia Gaskins sometime towards the end of the year.  She will then see me again a year from now  She knows to call for any other issue that may develop before then.   Mckade Gurka, Virgie Dad, MD  11/11/18 11:43 AM Medical Oncology and Hematology [facility] [address] [place], [place] [address] Tel. [phone]    Fax. [phone]   I, Wilburn Mylar, am acting as scribe for Dr. Virgie Dad. Kerin Kren.  I, Lurline Del MD, have reviewed the above documentation for accuracy and completeness, and I agree with the above.

## 2018-11-11 ENCOUNTER — Other Ambulatory Visit: Payer: Self-pay

## 2018-11-11 ENCOUNTER — Inpatient Hospital Stay (HOSPITAL_BASED_OUTPATIENT_CLINIC_OR_DEPARTMENT_OTHER): Payer: 59 | Admitting: Oncology

## 2018-11-11 ENCOUNTER — Inpatient Hospital Stay: Payer: 59 | Attending: Adult Health

## 2018-11-11 VITALS — BP 107/48 | HR 71 | Temp 98.3°F | Resp 18 | Ht 65.0 in | Wt 157.5 lb

## 2018-11-11 DIAGNOSIS — Z9013 Acquired absence of bilateral breasts and nipples: Secondary | ICD-10-CM | POA: Diagnosis not present

## 2018-11-11 DIAGNOSIS — Z9221 Personal history of antineoplastic chemotherapy: Secondary | ICD-10-CM | POA: Diagnosis not present

## 2018-11-11 DIAGNOSIS — Z853 Personal history of malignant neoplasm of breast: Secondary | ICD-10-CM

## 2018-11-11 DIAGNOSIS — Z791 Long term (current) use of non-steroidal anti-inflammatories (NSAID): Secondary | ICD-10-CM

## 2018-11-11 DIAGNOSIS — C50411 Malignant neoplasm of upper-outer quadrant of right female breast: Secondary | ICD-10-CM

## 2018-11-11 DIAGNOSIS — Z85828 Personal history of other malignant neoplasm of skin: Secondary | ICD-10-CM | POA: Insufficient documentation

## 2018-11-11 DIAGNOSIS — Z79899 Other long term (current) drug therapy: Secondary | ICD-10-CM | POA: Insufficient documentation

## 2018-11-11 DIAGNOSIS — E7800 Pure hypercholesterolemia, unspecified: Secondary | ICD-10-CM | POA: Diagnosis not present

## 2018-11-11 DIAGNOSIS — Z171 Estrogen receptor negative status [ER-]: Secondary | ICD-10-CM | POA: Insufficient documentation

## 2018-11-11 DIAGNOSIS — I10 Essential (primary) hypertension: Secondary | ICD-10-CM | POA: Diagnosis not present

## 2018-11-11 LAB — CBC WITH DIFFERENTIAL/PLATELET
Abs Immature Granulocytes: 0.02 10*3/uL (ref 0.00–0.07)
Basophils Absolute: 0 10*3/uL (ref 0.0–0.1)
Basophils Relative: 0 %
Eosinophils Absolute: 0.3 10*3/uL (ref 0.0–0.5)
Eosinophils Relative: 5 %
HCT: 40.4 % (ref 36.0–46.0)
Hemoglobin: 13.6 g/dL (ref 12.0–15.0)
Immature Granulocytes: 0 %
Lymphocytes Relative: 29 %
Lymphs Abs: 2 10*3/uL (ref 0.7–4.0)
MCH: 32.3 pg (ref 26.0–34.0)
MCHC: 33.7 g/dL (ref 30.0–36.0)
MCV: 96 fL (ref 80.0–100.0)
Monocytes Absolute: 0.6 10*3/uL (ref 0.1–1.0)
Monocytes Relative: 9 %
Neutro Abs: 3.9 10*3/uL (ref 1.7–7.7)
Neutrophils Relative %: 57 %
Platelets: 299 10*3/uL (ref 150–400)
RBC: 4.21 MIL/uL (ref 3.87–5.11)
RDW: 12.1 % (ref 11.5–15.5)
WBC: 6.9 10*3/uL (ref 4.0–10.5)
nRBC: 0 % (ref 0.0–0.2)

## 2018-11-11 LAB — COMPREHENSIVE METABOLIC PANEL
ALT: 17 U/L (ref 0–44)
AST: 18 U/L (ref 15–41)
Albumin: 4.3 g/dL (ref 3.5–5.0)
Alkaline Phosphatase: 98 U/L (ref 38–126)
Anion gap: 10 (ref 5–15)
BUN: 14 mg/dL (ref 6–20)
CO2: 26 mmol/L (ref 22–32)
Calcium: 9.7 mg/dL (ref 8.9–10.3)
Chloride: 105 mmol/L (ref 98–111)
Creatinine, Ser: 0.76 mg/dL (ref 0.44–1.00)
GFR calc Af Amer: >60 mL/min (ref >60)
GFR calc non Af Amer: >60 mL/min (ref >60)
Glucose, Bld: 89 mg/dL (ref 70–99)
Potassium: 4.6 mmol/L (ref 3.5–5.1)
Sodium: 141 mmol/L (ref 135–145)
Total Bilirubin: 0.3 mg/dL (ref 0.3–1.2)
Total Protein: 7.4 g/dL (ref 6.5–8.1)

## 2018-11-23 ENCOUNTER — Other Ambulatory Visit: Payer: Self-pay | Admitting: Oncology

## 2018-12-24 ENCOUNTER — Other Ambulatory Visit: Payer: Self-pay | Admitting: Oncology

## 2019-01-19 ENCOUNTER — Other Ambulatory Visit: Payer: Self-pay | Admitting: Oncology

## 2019-02-23 ENCOUNTER — Other Ambulatory Visit: Payer: Self-pay | Admitting: Physician Assistant

## 2019-02-23 DIAGNOSIS — R1011 Right upper quadrant pain: Secondary | ICD-10-CM

## 2019-02-26 ENCOUNTER — Other Ambulatory Visit: Payer: Self-pay | Admitting: Oncology

## 2019-03-01 IMAGING — US US AXILLARY LEFT
1 series · 11 of 11 positions shown · non-contrast
Comparison: None.

CLINICAL DATA: 52-year-old female presenting for ultrasound of the
left axilla to evaluate a palpable lump. The patient has history of
bilateral mastectomy following diagnosis of cancer in the right
breast. She has felt this lump since surgery, and feels that it has
decreased in size.

EXAM:
ULTRASOUND OF THE LEFT AXILLA

[Series 1: us axillary left · 0.04mm/px · 11 of 11 slices shown]
[im 1/11]
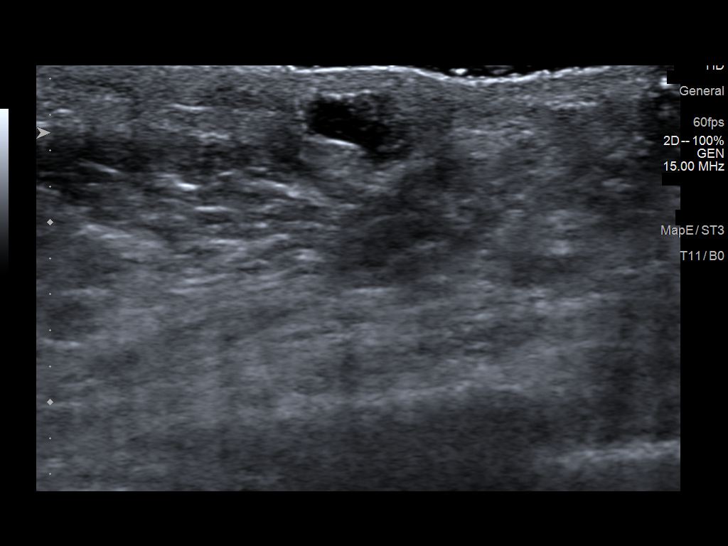
[im 2/11]
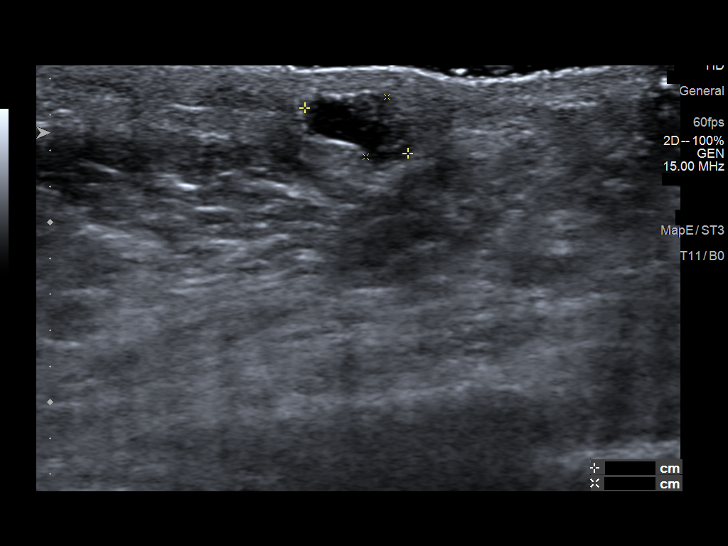
[im 3/11]
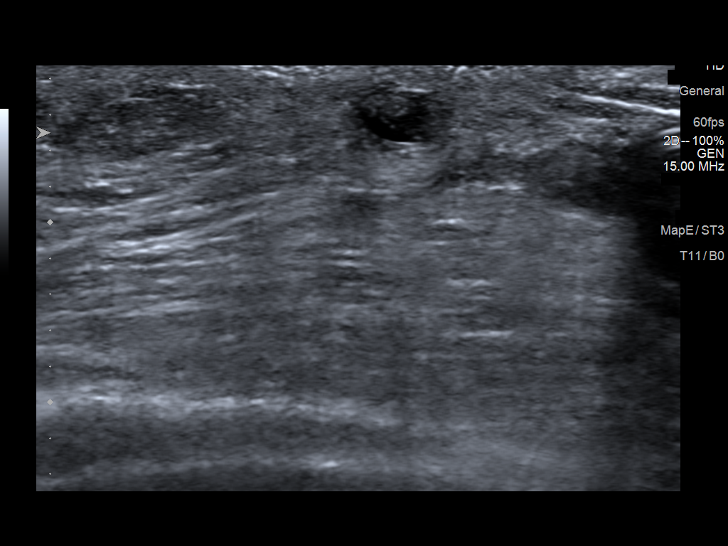
[im 4/11]
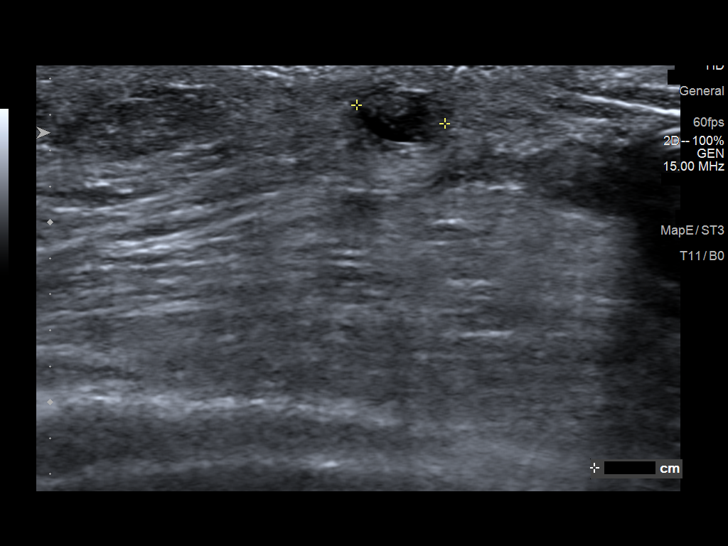
[im 5/11]
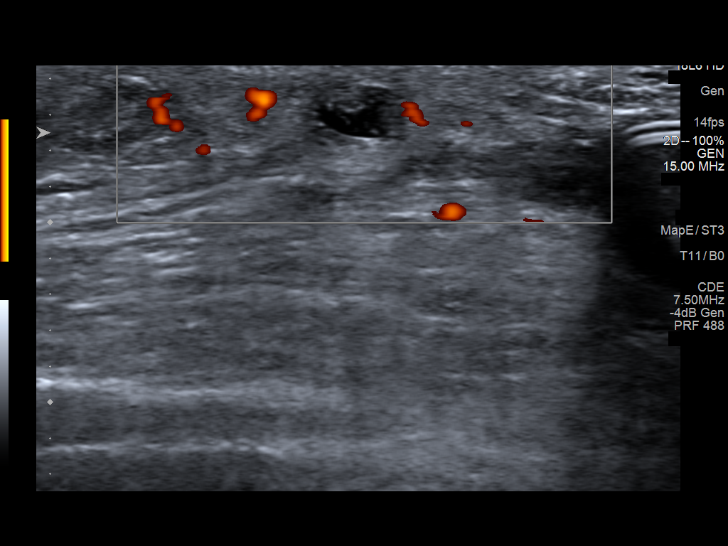
[im 6/11]
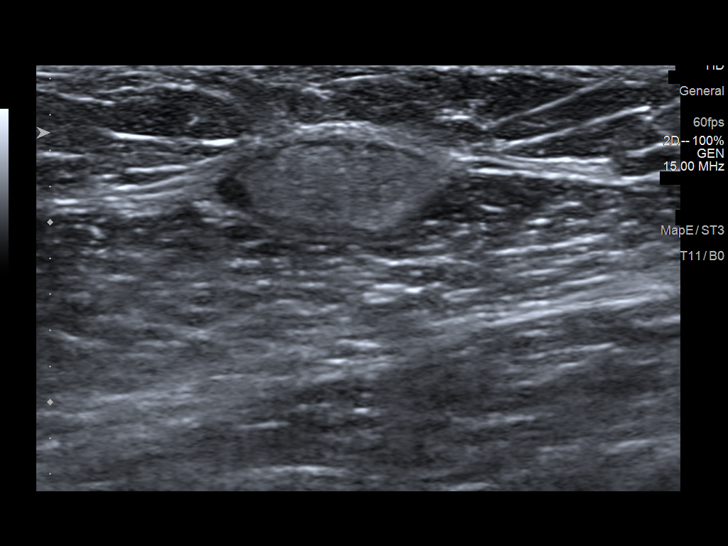
[im 7/11]
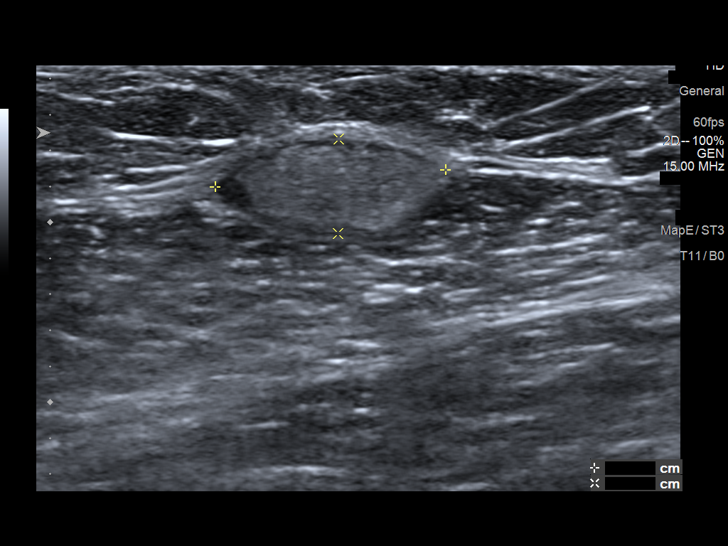
[im 8/11]
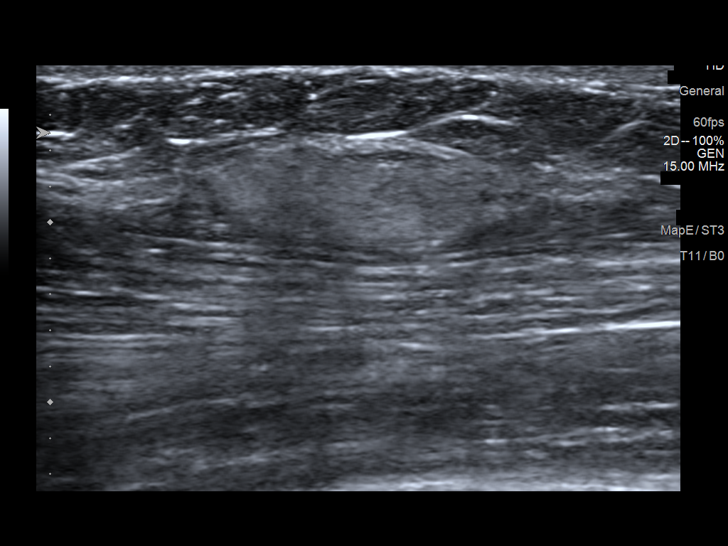
[im 9/11]
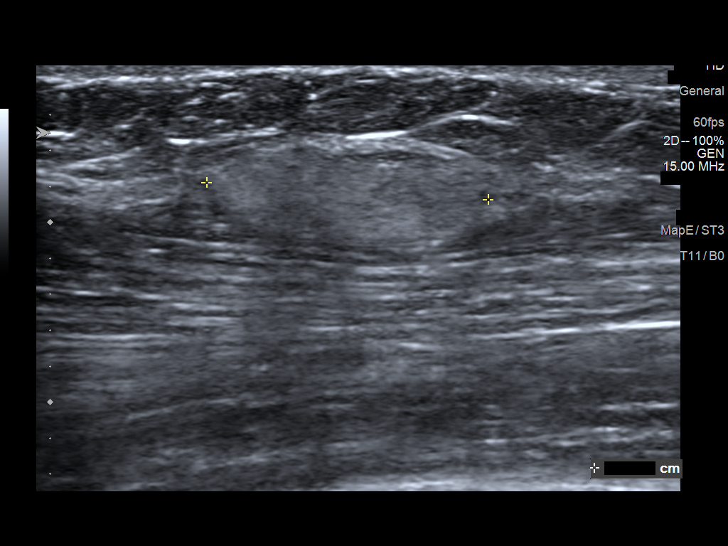
[im 10/11]
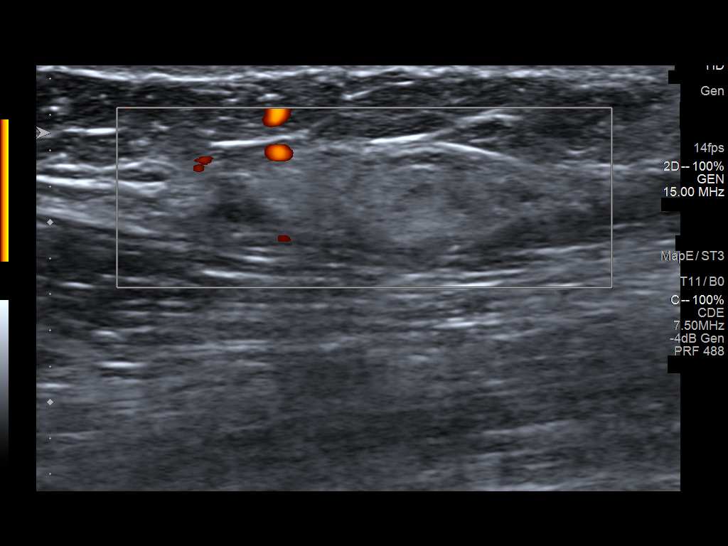
[im 11/11]
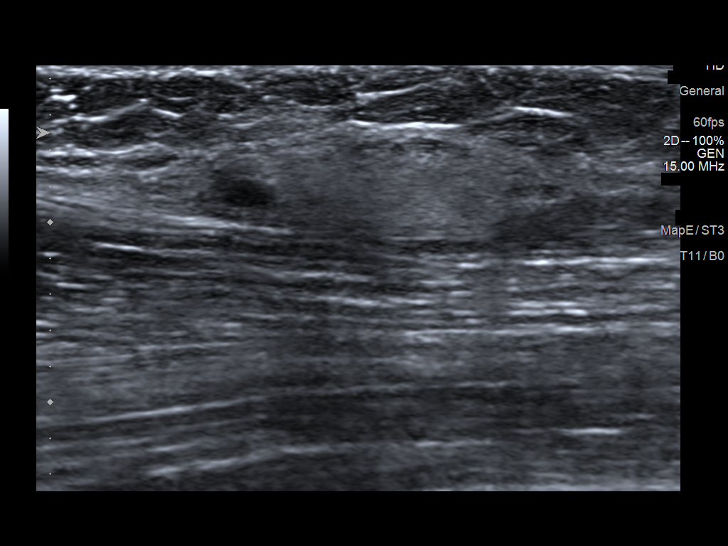

[11 of 11 positions shown; findings below may reference images not displayed]

FINDINGS: On physical exam,there is a firm mobile mass in the anterior
inferior left axilla.

Ultrasound of the palpable site in the left axilla demonstrates both
an echogenic oval mass measuring up to 1.6 cm, within an associated
adjacent anechoic superficial mass. These findings are compatible
with benign fat necrosis.
IMPRESSION: The palpable lump in the left axilla is consistent with benign fat
necrosis.

RECOMMENDATION:
Clinical followup is recommended with imaging evaluation as
clinically indicated.

I have discussed the findings and recommendations with the patient.
Results were also provided in writing at the conclusion of the
visit. If applicable, a reminder letter will be sent to the patient
regarding the next appointment.

BI-RADS CATEGORY  2: Benign.

## 2019-03-03 ENCOUNTER — Ambulatory Visit
Admission: RE | Admit: 2019-03-03 | Discharge: 2019-03-03 | Disposition: A | Discharge status: Home / Self Care | Payer: 59 | Source: Ambulatory Visit | Attending: Physician Assistant | Admitting: Physician Assistant

## 2019-03-03 DIAGNOSIS — R1011 Right upper quadrant pain: Secondary | ICD-10-CM

## 2019-03-28 ENCOUNTER — Other Ambulatory Visit: Payer: Self-pay | Admitting: Oncology

## 2019-04-30 ENCOUNTER — Other Ambulatory Visit: Payer: Self-pay | Admitting: Oncology

## 2019-05-15 ENCOUNTER — Other Ambulatory Visit: Payer: Self-pay | Admitting: Chiropractic Medicine

## 2019-05-15 ENCOUNTER — Ambulatory Visit
Admission: RE | Admit: 2019-05-15 | Discharge: 2019-05-15 | Disposition: A | Discharge status: Home / Self Care | Payer: 59 | Source: Ambulatory Visit | Attending: Chiropractic Medicine | Admitting: Chiropractic Medicine

## 2019-05-15 DIAGNOSIS — G8929 Other chronic pain: Secondary | ICD-10-CM

## 2019-05-15 DIAGNOSIS — M542 Cervicalgia: Secondary | ICD-10-CM

## 2019-05-31 ENCOUNTER — Other Ambulatory Visit: Payer: Self-pay | Admitting: Oncology

## 2019-06-03 DIAGNOSIS — N6092 Unspecified benign mammary dysplasia of left breast: Secondary | ICD-10-CM | POA: Diagnosis not present

## 2019-06-03 DIAGNOSIS — C50911 Malignant neoplasm of unspecified site of right female breast: Secondary | ICD-10-CM | POA: Diagnosis not present

## 2019-06-09 DIAGNOSIS — D225 Melanocytic nevi of trunk: Secondary | ICD-10-CM | POA: Diagnosis not present

## 2019-06-09 DIAGNOSIS — D2261 Melanocytic nevi of right upper limb, including shoulder: Secondary | ICD-10-CM | POA: Diagnosis not present

## 2019-06-09 DIAGNOSIS — L821 Other seborrheic keratosis: Secondary | ICD-10-CM | POA: Diagnosis not present

## 2019-06-09 DIAGNOSIS — L570 Actinic keratosis: Secondary | ICD-10-CM | POA: Diagnosis not present

## 2019-06-09 DIAGNOSIS — C44519 Basal cell carcinoma of skin of other part of trunk: Secondary | ICD-10-CM | POA: Diagnosis not present

## 2019-06-09 DIAGNOSIS — D2271 Melanocytic nevi of right lower limb, including hip: Secondary | ICD-10-CM | POA: Diagnosis not present

## 2019-06-15 DIAGNOSIS — M542 Cervicalgia: Secondary | ICD-10-CM | POA: Diagnosis not present

## 2019-06-15 DIAGNOSIS — M9902 Segmental and somatic dysfunction of thoracic region: Secondary | ICD-10-CM | POA: Diagnosis not present

## 2019-06-15 DIAGNOSIS — M9901 Segmental and somatic dysfunction of cervical region: Secondary | ICD-10-CM | POA: Diagnosis not present

## 2019-06-15 DIAGNOSIS — M9904 Segmental and somatic dysfunction of sacral region: Secondary | ICD-10-CM | POA: Diagnosis not present

## 2019-06-27 ENCOUNTER — Other Ambulatory Visit: Payer: Self-pay | Admitting: Oncology

## 2019-07-08 DIAGNOSIS — M542 Cervicalgia: Secondary | ICD-10-CM | POA: Diagnosis not present

## 2019-07-08 DIAGNOSIS — M9902 Segmental and somatic dysfunction of thoracic region: Secondary | ICD-10-CM | POA: Diagnosis not present

## 2019-07-08 DIAGNOSIS — M9901 Segmental and somatic dysfunction of cervical region: Secondary | ICD-10-CM | POA: Diagnosis not present

## 2019-07-08 DIAGNOSIS — M9904 Segmental and somatic dysfunction of sacral region: Secondary | ICD-10-CM | POA: Diagnosis not present

## 2019-07-24 DIAGNOSIS — M9904 Segmental and somatic dysfunction of sacral region: Secondary | ICD-10-CM | POA: Diagnosis not present

## 2019-07-24 DIAGNOSIS — M542 Cervicalgia: Secondary | ICD-10-CM | POA: Diagnosis not present

## 2019-07-24 DIAGNOSIS — M9902 Segmental and somatic dysfunction of thoracic region: Secondary | ICD-10-CM | POA: Diagnosis not present

## 2019-07-24 DIAGNOSIS — M9901 Segmental and somatic dysfunction of cervical region: Secondary | ICD-10-CM | POA: Diagnosis not present

## 2019-07-27 ENCOUNTER — Other Ambulatory Visit: Payer: Self-pay | Admitting: Oncology

## 2019-08-12 DIAGNOSIS — R5383 Other fatigue: Secondary | ICD-10-CM | POA: Diagnosis not present

## 2019-08-12 DIAGNOSIS — N951 Menopausal and female climacteric states: Secondary | ICD-10-CM | POA: Diagnosis not present

## 2019-08-14 DIAGNOSIS — M9902 Segmental and somatic dysfunction of thoracic region: Secondary | ICD-10-CM | POA: Diagnosis not present

## 2019-08-14 DIAGNOSIS — M9904 Segmental and somatic dysfunction of sacral region: Secondary | ICD-10-CM | POA: Diagnosis not present

## 2019-08-14 DIAGNOSIS — M542 Cervicalgia: Secondary | ICD-10-CM | POA: Diagnosis not present

## 2019-08-14 DIAGNOSIS — M9901 Segmental and somatic dysfunction of cervical region: Secondary | ICD-10-CM | POA: Diagnosis not present

## 2019-08-17 DIAGNOSIS — R232 Flushing: Secondary | ICD-10-CM | POA: Diagnosis not present

## 2019-08-17 DIAGNOSIS — R6882 Decreased libido: Secondary | ICD-10-CM | POA: Diagnosis not present

## 2019-08-17 DIAGNOSIS — N951 Menopausal and female climacteric states: Secondary | ICD-10-CM | POA: Diagnosis not present

## 2019-08-24 ENCOUNTER — Other Ambulatory Visit: Payer: Self-pay | Admitting: Oncology

## 2019-08-28 DIAGNOSIS — J209 Acute bronchitis, unspecified: Secondary | ICD-10-CM | POA: Diagnosis not present

## 2019-08-28 DIAGNOSIS — J22 Unspecified acute lower respiratory infection: Secondary | ICD-10-CM | POA: Diagnosis not present

## 2019-08-29 DIAGNOSIS — R05 Cough: Secondary | ICD-10-CM | POA: Diagnosis not present

## 2019-09-04 DIAGNOSIS — M9904 Segmental and somatic dysfunction of sacral region: Secondary | ICD-10-CM | POA: Diagnosis not present

## 2019-09-04 DIAGNOSIS — M542 Cervicalgia: Secondary | ICD-10-CM | POA: Diagnosis not present

## 2019-09-04 DIAGNOSIS — M9902 Segmental and somatic dysfunction of thoracic region: Secondary | ICD-10-CM | POA: Diagnosis not present

## 2019-09-04 DIAGNOSIS — M9901 Segmental and somatic dysfunction of cervical region: Secondary | ICD-10-CM | POA: Diagnosis not present

## 2019-09-10 DIAGNOSIS — Z01419 Encounter for gynecological examination (general) (routine) without abnormal findings: Secondary | ICD-10-CM | POA: Diagnosis not present

## 2019-09-10 DIAGNOSIS — Z6825 Body mass index (BMI) 25.0-25.9, adult: Secondary | ICD-10-CM | POA: Diagnosis not present

## 2019-09-25 DIAGNOSIS — M9902 Segmental and somatic dysfunction of thoracic region: Secondary | ICD-10-CM | POA: Diagnosis not present

## 2019-09-25 DIAGNOSIS — M542 Cervicalgia: Secondary | ICD-10-CM | POA: Diagnosis not present

## 2019-09-25 DIAGNOSIS — M9904 Segmental and somatic dysfunction of sacral region: Secondary | ICD-10-CM | POA: Diagnosis not present

## 2019-09-25 DIAGNOSIS — M9901 Segmental and somatic dysfunction of cervical region: Secondary | ICD-10-CM | POA: Diagnosis not present

## 2019-10-07 NOTE — Progress Notes (Signed)
[place]  [phone] [phone]     ID: Jennifer Beck DOB: Jun 20, 1965  MR#: [id number]  ZGY#:[id number]  Patient Care Team: Marda Stalker, PA-C as PCP - General (Family Medicine) [name], MD as Consulting Physician (Dermatology) [name], DC as Referring Physician (Chiropractic Medicine) [name], MD as Consulting Physician (General Surgery) [name], MD as Attending Physician (Radiation Oncology) [name], MD as Consulting Physician (Obstetrics and Gynecology) Fenna Semel, Virgie Dad, MD as Consulting Physician (Oncology) [name], MD as Consulting Physician (Plastic Surgery) Alda Berthold, DO as Consulting Physician (Neurology) Roseanne Kaufman, MD as Consulting Physician (Orthopedic Surgery) OTHER MD:  CHIEF COMPLAINT: Triple negative breast cancer (s/p bilateral mastectomies)  CURRENT TREATMENT: Observation    INTERVAL HISTORY: Jennifer Beck returns today for follow-up of her triple negative breast cancer. She continues under observation.   Since her last visit, she has not undergone any additional studies.   REVIEW OF SYSTEMS:  Jennifer Beck has been having significant issues related to menopause with insomnia, vaginal dryness, severe hot flashes, and other issues.  She tried testosterone and it was helpful she says but the level of testosterone was too high and she went over and has started on low-dose estradiol patch.  She only started that 3 weeks ago but she says it is already beginning to make a significant difference in the way she sleeps, vaginal dryness issues, and other menopausal problems.  What it has not done is taking care of the pain she has in her hands and other body pains probably are arthritis but she thinks may be rheumatoid arthritis.  She was rheumatoid factor negative last year but of course that does not rule out that issue.  Neither she nor her husband have received the coronavirus  vaccine.  HISTORY OF CURRENT ILLNESS:  From the original intake note:  Danilyn palpated a mass in her right breast sometime in September 2018.[name] She brought this to medical attention and her PCP, [name], set her up on 03/18/2017 for a unilateral right diagnostic mammography with ultrasonography, showing: Breast density D. The palpable right breast mass at 12:30 was indeterminate, and biopsy was warranted at that time. No evidence of right axillary lymphadenopathy was noted. Biopsy of the lesion in question on 03/21/2017 at the right breast 12:30 position showed ([id number])  invasive ductal carcinoma with lymphovascular invasion.  The tumor was grade 3, triple negative, with an MIB-1 of 80%.  She is accompanied by her husband, Jennifer Beck to the office today. She reports that she is doing well overall. She initially felt a lump in September in the shower and notes that she doesn't complete monthly self breast exams. She had a routine mammogram in March 2018 at [facility] and she is followed by Dr. [name].  As far as surgeries, she has had two laparoscopic procedures for endometriosis as well as a bilateral tubal ligation. She still has occasional cramping, painful ovulation, and vaginal spotting that comes and goes. She has had cyst to her breast before that have been evaluated and ruled as negative. She notes that she still has her tonsils, gallbladder, and appendix. She has a prior hx of pericarditis in 2003 that she notes was brought onby a virus. She has since been cleared by her prior Cardiologist.   She has a Restaurant manager, fast food, [name] that she sees every 5 weeks for maintenance of her neck and back issues. She has a dermatologist, [name] at [facility] and  she has had an biopsy of an area from her left chest that resulted as basal cell carcinoma in 2015. She denies having a GI specialist, Cardiologist, or Orthopedist at this time. She denies prior  history of seizures, migraines, acid reflux, asthma, emphysema, palpitations or GI issues.   [name] is as detailed below.   PAST MEDICAL HISTORY: Past Medical History:  Diagnosis Date  . Arthritis    leg and muscle pains  . Chest pain   . Complication of anesthesia   . Endometriosis   . Family history of breast cancer 04/22/2017  . Family history of colon cancer 04/22/2017  . Family history of kidney cancer 04/22/2017  . Fatigue   . GERD (gastroesophageal reflux disease)   . GI bleed    caused by ischemic colitis following an episode of hypertension  . Heart palpitations   . Hx of echocardiogram    a. echo 9/13:  EF 55-60%, Gr 2 diast dysfn  . Hypercholesterolemia   . Ischemic colitis ([place])   . Myocarditis ([place])   . Pericarditis   . PONV (postoperative nausea and vomiting)   . Restrictive pericarditis 2004  . SOB (shortness of breath)     PAST SURGICAL HISTORY: Past Surgical History:  Procedure Laterality Date  . BREAST RECONSTRUCTION WITH PLACEMENT OF TISSUE EXPANDER AND ALLODERM Bilateral 10/08/2017   Procedure: BREAST RECONSTRUCTION WITH PLACEMENT OF TISSUE EXPANDER AND ALLODERM;  Surgeon: [name], MD;  Location: [facility];  Service: Plastics;  Laterality: Bilateral;  . CARDIAC CATHETERIZATION  08/26/2001   EF of 60% --- smooth & normal coronary arteries -- there is a Epple motion defect consistent with posterolateral MI -- she quite possibly had a coronary spasm -- she may have some pericarditis secondary to her MI   . DILATION AND CURETTAGE OF UTERUS  10/15/2003   Uterine enlargement, menorrhagia  . DILATION AND CURETTAGE OF UTERUS  05/27/2002   Dysfunctional uterine bleeding  . LAPAROSCOPY    . LIPOSUCTION WITH LIPOFILLING Bilateral 01/17/2018   Procedure: LIPOFILLING FROM ABDOMEN TO CHEST;  Surgeon: [name], MD;  Location: [facility];  Service: Plastics;  Laterality: Bilateral;  . MASTECTOMY  W/ SENTINEL NODE BIOPSY Bilateral 10/08/2017   Procedure: RIGHT MASTECTOMY WITH RIGHT AXILLARY SENTINEL LYMPH NODE BIOPSY AND LEFT PROPHYLACTIC MASTECTOMY;  Surgeon: [name], MD;  Location: [facility];  Service: General;  Laterality: Bilateral;  . NOVASURE ABLATION  10/15/2003   for management of her extreme menorrhagi  . PORTACATH PLACEMENT Right 04/02/2017   Procedure: INSERTION PORT-A-CATH;  Surgeon: [name], MD;  Location: [facility];  Service: General;  Laterality: Right;  . REMOVAL OF BILATERAL TISSUE EXPANDERS WITH PLACEMENT OF BILATERAL BREAST IMPLANTS Bilateral 01/17/2018   Procedure: REMOVAL OF BILATERAL TISSUE EXPANDERS WITH PLACEMENT OF BILATERAL SILICONE BREAST IMPLANTS;  Surgeon: [name], MD;  Location: [facility];  Service: Plastics;  Laterality: Bilateral;  . TUBAL LIGATION  09/2000   bilateral    FAMILY HISTORY Family History  Problem Relation Age of Onset  . Hypertension Father   . Diabetes Father   . Other Father        stomach mass suspicious for ca, never bx  . Coronary artery disease Mother   . Kidney cancer Mother 62  . Coronary artery disease Maternal Grandfather   . Colon cancer Maternal Grandfather 61       recurrence  . Coronary artery disease Maternal Uncle   . Coronary  artery disease Maternal Uncle   . Diabetes Maternal Uncle   . Colon cancer Maternal Grandmother 81  . Breast cancer Cousin 67       had GT- CHEK2+  . Cervical cancer Maternal Aunt 35  . Diabetes Maternal Aunt   . Alzheimer's disease Paternal Grandmother   . Colon cancer Paternal Grandfather 42  . Breast cancer Paternal Aunt   . Breast cancer Paternal Aunt   . Breast cancer Other 82   Her father died 15-Feb-2016 at age 63 just 23 week shy of his 83rd birthday. Her mother is 76 years old as of October 2018. Patient has one brother and no sisters. Her mother was diagnosed with kidney cancer at age 51 with a nephrectomy following. Her maternal  grandmother was diagnosed with colon cancer at age 56.  The patient paternal grandfather was diagnosed with colon cancer in his 64's. She has paternal cousins through her fathers half-sisters that have been diagnosed with breast cancer, she is unsure of the number of breast cancer occurrences due to the distance . A maternal cousin was diagnosed with breast cancer at age 85 and she had genetic testing. The patient does have a copy of this testing as well as her PCP.  This was not available for review on the nasal   GYNECOLOGIC HISTORY:  No LMP recorded. Patient is postmenopausal.  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 Her husband Jennifer Beck is a Airline pilot. Her daughter Jennifer Beck is 64 and currently a sophomore at [employer]. 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  The patient attends [employer]. She lives in [place].     ADVANCED DIRECTIVES:    HEALTH MAINTENANCE: Social History   Tobacco Use  . Smoking status: Never Smoker  . Smokeless tobacco: Never Used  Substance Use Topics  . Alcohol use: Yes    Comment: Occasionally drinks wine  . Drug use: No     Colonoscopy: December 2019 ([facility])  PAP: March 2018   Bone density: N/A   Allergies  Allergen Reactions  . Codeine Itching  . Biaxin [Clarithromycin]     Heart Burn    Current Outpatient Medications  Medication Sig Dispense Refill  . acetaminophen (TYLENOL) 325 MG tablet Take 650 mg by mouth every 6 (six) hours as needed.    . Cholecalciferol (VITAMIN D) 2000 units CAPS Take 2,000 Units by mouth daily.    . Estradiol 10 MCG TABS vaginal tablet INSERT 1 TABLET VAGINALLY 2 TIMES A WEEK FOR 4 WEEKS. MAY DECREASE TO WEEKLY 8 tablet  0  . glucosamine-chondroitin 500-400 MG tablet Take 1 tablet by mouth daily.     [name] ibuprofen (ADVIL,MOTRIN) 200 MG tablet Take 400 mg by mouth every 8 (eight) hours as needed for headache or mild pain.     . INTRAROSA 6.5 MG INST I 1 VAGINAL I VAGINALLY QD HS    . Multiple Minerals-Vitamins (CALCIUM-MAGNESIUM-ZINC) TABS Take 1 tablet by mouth daily.     . Multiple Vitamins-Minerals (MULTIVITAMINS THER. W/MINERALS) TABS Take 1 tablet by mouth daily.     . Nutritional Supplements (ESTROVEN PO) Take by mouth daily.    Vladimir Faster Glycol-Propyl Glycol (SYSTANE  OP) Apply 1 drop to eye 2 (two) times daily.     No current facility-administered medications for this visit.   Facility-Administered Medications Ordered in Other Visits  Medication Dose Route Frequency Provider Last Rate Last Admin  . sodium chloride flush (NS) 0.9 % injection 10 mL  10 mL Intracatheter PRN Iria Jamerson, Virgie Dad, MD   10 mL at 07/04/17 1749    OBJECTIVE:white woman in no acute distress Vitals:   10/08/19 1258  BP: 110/67  Pulse: 82  Resp: 18  Temp: 98.3 F (36.8 C)  SpO2: 100%     Body mass index is 26.66 kg/m.   Wt Readings from Last 3 Encounters:  10/08/19 160 lb 3.2 oz (72.7 kg)  11/11/18 157 lb 8 oz (71.4 kg)  10/28/18 158 lb (71.7 kg)   ECOG FS:1  Sclerae unicteric, pupils round and equal No cervical or supraclavicular adenopathy Lungs no rales or rhonchi Heart regular rate and rhythm Abd soft, nontender, positive bowel sounds MSK no focal spinal tenderness, hands show some evidence of osteoarthritis, not suggestive of rheumatoid arthritis Neuro: nonfocal, well oriented, appropriate affect Breasts: Status post bilateral mastectomies with bilateral silicone implant reconstruction.  The cosmetic result is excellent and the nipple tattoos are among the best I have seen.  There is no evidence of local recurrence.  Both axillae are benign.  LAB RESULTS:  CMP     Component Value Date/Time   NA 141  10/08/2019 1246   NA 137 05/30/2017 1219   K 4.0 10/08/2019 1246   K 3.8 05/30/2017 1219   CL 104 10/08/2019 1246   CO2 26 10/08/2019 1246   CO2 25 05/30/2017 1219   GLUCOSE 94 10/08/2019 1246   GLUCOSE 122 05/30/2017 1219   BUN 11 10/08/2019 1246   BUN 8.9 05/30/2017 1219   CREATININE 0.75 10/08/2019 1246   CREATININE 0.7 05/30/2017 1219   CALCIUM 9.7 10/08/2019 1246   CALCIUM 9.4 05/30/2017 1219   PROT 7.8 10/08/2019 1246   PROT 6.4 05/30/2017 1219   ALBUMIN 4.2 10/08/2019 1246   ALBUMIN 3.7 05/30/2017 1219   AST 22 10/08/2019 1246   AST 12 05/30/2017 1219   ALT 21 10/08/2019 1246   ALT 14 05/30/2017 1219   ALKPHOS 103 10/08/2019 1246   ALKPHOS 137 05/30/2017 1219   BILITOT 0.3 10/08/2019 1246   BILITOT 0.38 05/30/2017 1219   GFRNONAA >60 10/08/2019 1246   GFRAA >60 10/08/2019 1246    No results found for: TOTALPROTELP, ALBUMINELP, A1GS, A2GS, BETS, BETA2SER, GAMS, MSPIKE, SPEI  No results found for: KPAFRELGTCHN, LAMBDASER, KAPLAMBRATIO  Lab Results  Component Value Date   WBC 6.3 10/08/2019   NEUTROABS 3.8 10/08/2019   HGB 14.0 10/08/2019   HCT 41.4 10/08/2019   MCV 96.5 10/08/2019   PLT 347 10/08/2019    _0 @  No results found for: LABCA2  No components found for: PPIRJJ884  No results for input(s): INR in the last 168 hours.  No results found for: LABCA2  No results found for: ZYS063  No results found for: KZS010  No results found for: XNA355  No results found for: CA2729  No components found for: HGQUANT  No results found for: CEA1 / No results found for: CEA1   No results found for: AFPTUMOR  No results found for: CHROMOGRNA  No results found for: PSA1  Appointment on 10/08/2019  Component Date Value Ref Range Status  . Sodium 10/08/2019 141  135 - 145 mmol/L Final  . Potassium 10/08/2019  4.0  3.5 - 5.1 mmol/L Final  . Chloride 10/08/2019 104  98 - 111 mmol/L Final  . CO2 10/08/2019 26  22 - 32 mmol/L Final  . Glucose,  Bld 10/08/2019 94  70 - 99 mg/dL Final   Glucose reference range applies only to samples taken after fasting for at least 8 hours.  . BUN 10/08/2019 11  6 - 20 mg/dL Final  . Creatinine, Ser 10/08/2019 0.75  0.44 - 1.00 mg/dL Final  . Calcium 10/08/2019 9.7  8.9 - 10.3 mg/dL Final  . Total Protein 10/08/2019 7.8  6.5 - 8.1 g/dL Final  . Albumin 10/08/2019 4.2  3.5 - 5.0 g/dL Final  . AST 10/08/2019 22  15 - 41 U/L Final  . ALT 10/08/2019 21  0 - 44 U/L Final  . Alkaline Phosphatase 10/08/2019 103  38 - 126 U/L Final  . Total Bilirubin 10/08/2019 0.3  0.3 - 1.2 mg/dL Final  . GFR calc non Af Amer 10/08/2019 >60  >60 mL/min Final  . GFR calc Af Amer 10/08/2019 >60  >60 mL/min Final  . Anion gap 10/08/2019 11  5 - 15 Final   Performed at [facility], [place] [address], [place], [place] [address]  . WBC 10/08/2019 6.3  4.0 - 10.5 K/uL Final  . RBC 10/08/2019 4.29  3.87 - 5.11 MIL/uL Final  . Hemoglobin 10/08/2019 14.0  12.0 - 15.0 g/dL Final  . HCT 10/08/2019 41.4  36.0 - 46.0 % Final  . MCV 10/08/2019 96.5  80.0 - 100.0 fL Final  . MCH 10/08/2019 32.6  26.0 - 34.0 pg Final  . MCHC 10/08/2019 33.8  30.0 - 36.0 g/dL Final  . RDW 10/08/2019 12.5  11.5 - 15.5 % Final  . Platelets 10/08/2019 347  150 - 400 K/uL Final  . nRBC 10/08/2019 0.0  0.0 - 0.2 % Final  . Neutrophils Relative % 10/08/2019 60  % Final  . Neutro Abs 10/08/2019 3.8  1.7 - 7.7 K/uL Final  . Lymphocytes Relative 10/08/2019 28  % Final  . Lymphs Abs 10/08/2019 1.8  0.7 - 4.0 K/uL Final  . Monocytes Relative 10/08/2019 7  % Final  . Monocytes Absolute 10/08/2019 0.5  0.1 - 1.0 K/uL Final  . Eosinophils Relative 10/08/2019 4  % Final  . Eosinophils Absolute 10/08/2019 0.3  0.0 - 0.5 K/uL Final  . Basophils Relative 10/08/2019 1  % Final  . Basophils Absolute 10/08/2019 0.0  0.0 - 0.1 K/uL Final  . Immature Granulocytes 10/08/2019 0  % Final  . Abs Immature Granulocytes 10/08/2019 0.01  0.00 - 0.07  K/uL Final   Performed at [facility], [place] [address] [place], [place] [address]    (this displays the last labs from the last 3 days)  No results found for: TOTALPROTELP, ALBUMINELP, A1GS, A2GS, BETS, BETA2SER, GAMS, MSPIKE, SPEI (this displays SPEP labs)  No results found for: KPAFRELGTCHN, LAMBDASER, KAPLAMBRATIO (kappa/lambda light chains)  No results found for: HGBA, HGBA2QUANT, HGBFQUANT, HGBSQUAN (Hemoglobinopathy evaluation)   No results found for: LDH  No results found for: IRON, TIBC, IRONPCTSAT (Iron and TIBC)  No results found for: FERRITIN  Urinalysis No results found for: COLORURINE, APPEARANCEUR, LABSPEC, PHURINE, GLUCOSEU, HGBUR, BILIRUBINUR, KETONESUR, PROTEINUR, UROBILINOGEN, NITRITE, LEUKOCYTESUR   STUDIES: No results found.  ELIGIBLE FOR AVAILABLE RESEARCH PROTOCOL: Enrolled in cancer disparities study   ASSESSMENT: 54 y.o. [place] woman status post right breast upper inner quadrant biopsy March 21, 2017  for a clinical T2 N0  Stage IIB invasive ductal carcinoma, grade 3, triple negative, with an MIB-1 of 80%.  (a) biopsy of 2 additional suspicious areas in the right breast and one in the left breast 04/11/2017 showed atypical lobular hyperplasia and sclerosing adenosis but no evidence of cancer  (1) neoadjuvant chemotherapy consisting of doxorubicin and cyclophosphamide in dose dense fashion x4 completed 05/23/2017, followed by paclitaxel/carboplatin weekly x12, first dose 06/06/2017, completed 08/29/2017  (2) status post bilateral mastectomies 10/08/2017, showing  (a) left breast, atypical lobular hyperplasia, no evidence of malignancy  (b) right breast, residual  pT1b pN0 invasive ductal carcinoma, grade 3, with negative margins.  A total of 2 sentinel lymph nodes were removed; repeat prognostic panel requested 10/18/2017  (c) immediate expander reconstruction with silicone implants on 40/34/7425  (3) adjuvant radiation  not indicated  (4) genetics testing 04/22/2017 through the common hereditary cancer panel plus renal/urinary tract cancel panel showed no deleterious mutations in APC, ATM, AXIN2, BAP1, BARD1, BMPR1A, BRCA1, BRCA2, BRIP1, BUB1B, CDC73, CDH1, CDK4, CDKN1C, CDKN2A (p14ARF), CDKN2A (p16INK4a), CEP57, CHEK2, CTNNA1, DICER1, DIS3L2, EPCAM*, FH, FLCN, GPC3, GREM1*, KIT, MEN1, MET, MLH1,MSH2, MSH3, MSH6, MUTYH, NBN, NF1, PALB2, PDGFRA, PMS2, POLD1, POLE, PTEN, RAD50, RAD51C, RAD51D, SDHB,SDHC, SDHD, SMAD4, SMARCA4, SMARCB1, STK11, TP53, TSC1, TSC2, VHL, WT1.The following genes were evaluated for sequence changes only: HOXB13*, MITF*, NTHL1*, SDHA  (a) there was a Variant of Uncertain Significance identified in POLD1, namely c.2953C>T (p.Arg985Trp)  (5) declined participation in S 1418 due to concerns regarding side effects  PLAN:  Jennifer Beck is now 2 years out from definitive surgery for her breast cancer with no evidence of disease recurrence.  This is very favorable.  She is benefiting from low-dose estrogen patch.  She presumably has no breast tissue left after her bilateral mastectomies and recall that her tumor was estrogen and progesterone receptor negative.  Of course she still has a uterus and unopposed estrogen can cause uterine cancer.  I am going to get a baseline ultrasound of the pelvis in October.  She will then follow-up with Dr. Nori Riis in April of next year and we can decide if any further imaging is warranted at that time  I suggested that she and her husband would do well to proceed to get vaccinated and specifically suggested the [place] version.  She is reluctant to receive the vaccine because she thinks she might have rheumatoid arthritis although I have to say I really do not see evidence to suggest that.  She will return to see me in 1 year.  She knows to call for any other issue that may develop before then  Total encounter time 30 minutes.*  Emrys Mckamie, Virgie Dad, MD  10/08/19 1:30  PM Medical Oncology and Hematology [facility] [address] [place], [place] [address] Tel. [phone]    Fax. [phone]   I, Wilburn Mylar, am acting as scribe for Dr. Virgie Dad. Tally Mckinnon.  I, Lurline Del MD, have reviewed the above documentation for accuracy and completeness, and I agree with the above.

## 2019-10-08 ENCOUNTER — Inpatient Hospital Stay: Payer: BC Managed Care – PPO | Attending: Oncology

## 2019-10-08 ENCOUNTER — Other Ambulatory Visit: Payer: Self-pay

## 2019-10-08 ENCOUNTER — Inpatient Hospital Stay (HOSPITAL_BASED_OUTPATIENT_CLINIC_OR_DEPARTMENT_OTHER): Payer: BC Managed Care – PPO | Admitting: Oncology

## 2019-10-08 VITALS — BP 110/67 | HR 82 | Temp 98.3°F | Resp 18 | Ht 65.0 in | Wt 160.2 lb

## 2019-10-08 DIAGNOSIS — Z8349 Family history of other endocrine, nutritional and metabolic diseases: Secondary | ICD-10-CM | POA: Diagnosis not present

## 2019-10-08 DIAGNOSIS — Z171 Estrogen receptor negative status [ER-]: Secondary | ICD-10-CM

## 2019-10-08 DIAGNOSIS — Z833 Family history of diabetes mellitus: Secondary | ICD-10-CM | POA: Insufficient documentation

## 2019-10-08 DIAGNOSIS — Z79899 Other long term (current) drug therapy: Secondary | ICD-10-CM | POA: Diagnosis not present

## 2019-10-08 DIAGNOSIS — Z885 Allergy status to narcotic agent status: Secondary | ICD-10-CM | POA: Insufficient documentation

## 2019-10-08 DIAGNOSIS — Z8051 Family history of malignant neoplasm of kidney: Secondary | ICD-10-CM | POA: Insufficient documentation

## 2019-10-08 DIAGNOSIS — C50211 Malignant neoplasm of upper-inner quadrant of right female breast: Secondary | ICD-10-CM | POA: Diagnosis not present

## 2019-10-08 DIAGNOSIS — Z8049 Family history of malignant neoplasm of other genital organs: Secondary | ICD-10-CM | POA: Diagnosis not present

## 2019-10-08 DIAGNOSIS — Z818 Family history of other mental and behavioral disorders: Secondary | ICD-10-CM | POA: Diagnosis not present

## 2019-10-08 DIAGNOSIS — C50411 Malignant neoplasm of upper-outer quadrant of right female breast: Secondary | ICD-10-CM | POA: Diagnosis not present

## 2019-10-08 DIAGNOSIS — Z803 Family history of malignant neoplasm of breast: Secondary | ICD-10-CM | POA: Diagnosis not present

## 2019-10-08 DIAGNOSIS — Z8249 Family history of ischemic heart disease and other diseases of the circulatory system: Secondary | ICD-10-CM | POA: Diagnosis not present

## 2019-10-08 DIAGNOSIS — M1990 Unspecified osteoarthritis, unspecified site: Secondary | ICD-10-CM | POA: Insufficient documentation

## 2019-10-08 DIAGNOSIS — Z9013 Acquired absence of bilateral breasts and nipples: Secondary | ICD-10-CM | POA: Insufficient documentation

## 2019-10-08 DIAGNOSIS — Z800 Family history of malignant neoplasm of digestive organs: Secondary | ICD-10-CM | POA: Insufficient documentation

## 2019-10-08 LAB — CBC WITH DIFFERENTIAL/PLATELET
Abs Immature Granulocytes: 0.01 10*3/uL (ref 0.00–0.07)
Basophils Absolute: 0 10*3/uL (ref 0.0–0.1)
Basophils Relative: 1 %
Eosinophils Absolute: 0.3 10*3/uL (ref 0.0–0.5)
Eosinophils Relative: 4 %
HCT: 41.4 % (ref 36.0–46.0)
Hemoglobin: 14 g/dL (ref 12.0–15.0)
Immature Granulocytes: 0 %
Lymphocytes Relative: 28 %
Lymphs Abs: 1.8 10*3/uL (ref 0.7–4.0)
MCH: 32.6 pg (ref 26.0–34.0)
MCHC: 33.8 g/dL (ref 30.0–36.0)
MCV: 96.5 fL (ref 80.0–100.0)
Monocytes Absolute: 0.5 10*3/uL (ref 0.1–1.0)
Monocytes Relative: 7 %
Neutro Abs: 3.8 10*3/uL (ref 1.7–7.7)
Neutrophils Relative %: 60 %
Platelets: 347 10*3/uL (ref 150–400)
RBC: 4.29 MIL/uL (ref 3.87–5.11)
RDW: 12.5 % (ref 11.5–15.5)
WBC: 6.3 10*3/uL (ref 4.0–10.5)
nRBC: 0 % (ref 0.0–0.2)

## 2019-10-08 LAB — COMPREHENSIVE METABOLIC PANEL
ALT: 21 U/L (ref 0–44)
AST: 22 U/L (ref 15–41)
Albumin: 4.2 g/dL (ref 3.5–5.0)
Alkaline Phosphatase: 103 U/L (ref 38–126)
Anion gap: 11 (ref 5–15)
BUN: 11 mg/dL (ref 6–20)
CO2: 26 mmol/L (ref 22–32)
Calcium: 9.7 mg/dL (ref 8.9–10.3)
Chloride: 104 mmol/L (ref 98–111)
Creatinine, Ser: 0.75 mg/dL (ref 0.44–1.00)
GFR calc Af Amer: >60 mL/min (ref >60)
GFR calc non Af Amer: >60 mL/min (ref >60)
Glucose, Bld: 94 mg/dL (ref 70–99)
Potassium: 4 mmol/L (ref 3.5–5.1)
Sodium: 141 mmol/L (ref 135–145)
Total Bilirubin: 0.3 mg/dL (ref 0.3–1.2)
Total Protein: 7.8 g/dL (ref 6.5–8.1)

## 2019-10-09 ENCOUNTER — Telehealth: Payer: Self-pay | Admitting: Oncology

## 2019-10-09 NOTE — Telephone Encounter (Signed)
Scheduled appts per 5/13 los. Pt confirmed appt date and time.

## 2019-10-13 ENCOUNTER — Other Ambulatory Visit: Payer: 59

## 2019-10-13 ENCOUNTER — Ambulatory Visit: Payer: 59 | Admitting: Oncology

## 2019-10-16 DIAGNOSIS — M9904 Segmental and somatic dysfunction of sacral region: Secondary | ICD-10-CM | POA: Diagnosis not present

## 2019-10-16 DIAGNOSIS — M542 Cervicalgia: Secondary | ICD-10-CM | POA: Diagnosis not present

## 2019-10-16 DIAGNOSIS — M9901 Segmental and somatic dysfunction of cervical region: Secondary | ICD-10-CM | POA: Diagnosis not present

## 2019-10-16 DIAGNOSIS — M9902 Segmental and somatic dysfunction of thoracic region: Secondary | ICD-10-CM | POA: Diagnosis not present

## 2019-10-22 DIAGNOSIS — E663 Overweight: Secondary | ICD-10-CM | POA: Diagnosis not present

## 2019-10-22 DIAGNOSIS — M150 Primary generalized (osteo)arthritis: Secondary | ICD-10-CM | POA: Diagnosis not present

## 2019-10-22 DIAGNOSIS — R5382 Chronic fatigue, unspecified: Secondary | ICD-10-CM | POA: Diagnosis not present

## 2019-10-22 DIAGNOSIS — M2550 Pain in unspecified joint: Secondary | ICD-10-CM | POA: Diagnosis not present

## 2019-11-06 DIAGNOSIS — M9904 Segmental and somatic dysfunction of sacral region: Secondary | ICD-10-CM | POA: Diagnosis not present

## 2019-11-06 DIAGNOSIS — M9902 Segmental and somatic dysfunction of thoracic region: Secondary | ICD-10-CM | POA: Diagnosis not present

## 2019-11-06 DIAGNOSIS — M542 Cervicalgia: Secondary | ICD-10-CM | POA: Diagnosis not present

## 2019-11-06 DIAGNOSIS — M9901 Segmental and somatic dysfunction of cervical region: Secondary | ICD-10-CM | POA: Diagnosis not present

## 2019-12-04 DIAGNOSIS — M9904 Segmental and somatic dysfunction of sacral region: Secondary | ICD-10-CM | POA: Diagnosis not present

## 2019-12-04 DIAGNOSIS — M9901 Segmental and somatic dysfunction of cervical region: Secondary | ICD-10-CM | POA: Diagnosis not present

## 2019-12-04 DIAGNOSIS — M542 Cervicalgia: Secondary | ICD-10-CM | POA: Diagnosis not present

## 2019-12-04 DIAGNOSIS — M9902 Segmental and somatic dysfunction of thoracic region: Secondary | ICD-10-CM | POA: Diagnosis not present

## 2019-12-18 DIAGNOSIS — Z86010 Personal history of colonic polyps: Secondary | ICD-10-CM | POA: Diagnosis not present

## 2019-12-18 DIAGNOSIS — R1013 Epigastric pain: Secondary | ICD-10-CM | POA: Diagnosis not present

## 2019-12-18 DIAGNOSIS — F458 Other somatoform disorders: Secondary | ICD-10-CM | POA: Diagnosis not present

## 2019-12-18 DIAGNOSIS — K219 Gastro-esophageal reflux disease without esophagitis: Secondary | ICD-10-CM | POA: Diagnosis not present

## 2020-01-01 DIAGNOSIS — M9901 Segmental and somatic dysfunction of cervical region: Secondary | ICD-10-CM | POA: Diagnosis not present

## 2020-01-01 DIAGNOSIS — M9904 Segmental and somatic dysfunction of sacral region: Secondary | ICD-10-CM | POA: Diagnosis not present

## 2020-01-01 DIAGNOSIS — M542 Cervicalgia: Secondary | ICD-10-CM | POA: Diagnosis not present

## 2020-01-01 DIAGNOSIS — M9902 Segmental and somatic dysfunction of thoracic region: Secondary | ICD-10-CM | POA: Diagnosis not present

## 2020-01-05 DIAGNOSIS — Z1159 Encounter for screening for other viral diseases: Secondary | ICD-10-CM | POA: Diagnosis not present

## 2020-01-07 DIAGNOSIS — Z853 Personal history of malignant neoplasm of breast: Secondary | ICD-10-CM | POA: Diagnosis not present

## 2020-01-07 DIAGNOSIS — Z9013 Acquired absence of bilateral breasts and nipples: Secondary | ICD-10-CM | POA: Diagnosis not present

## 2020-01-08 DIAGNOSIS — K319 Disease of stomach and duodenum, unspecified: Secondary | ICD-10-CM | POA: Diagnosis not present

## 2020-01-08 DIAGNOSIS — R1010 Upper abdominal pain, unspecified: Secondary | ICD-10-CM | POA: Diagnosis not present

## 2020-01-08 DIAGNOSIS — K2970 Gastritis, unspecified, without bleeding: Secondary | ICD-10-CM | POA: Diagnosis not present

## 2020-01-08 DIAGNOSIS — F458 Other somatoform disorders: Secondary | ICD-10-CM | POA: Diagnosis not present

## 2020-01-11 DIAGNOSIS — K3189 Other diseases of stomach and duodenum: Secondary | ICD-10-CM | POA: Diagnosis not present

## 2020-01-22 DIAGNOSIS — M9904 Segmental and somatic dysfunction of sacral region: Secondary | ICD-10-CM | POA: Diagnosis not present

## 2020-01-22 DIAGNOSIS — M9901 Segmental and somatic dysfunction of cervical region: Secondary | ICD-10-CM | POA: Diagnosis not present

## 2020-01-22 DIAGNOSIS — M9902 Segmental and somatic dysfunction of thoracic region: Secondary | ICD-10-CM | POA: Diagnosis not present

## 2020-01-22 DIAGNOSIS — M542 Cervicalgia: Secondary | ICD-10-CM | POA: Diagnosis not present

## 2020-02-19 DIAGNOSIS — M9901 Segmental and somatic dysfunction of cervical region: Secondary | ICD-10-CM | POA: Diagnosis not present

## 2020-02-19 DIAGNOSIS — M542 Cervicalgia: Secondary | ICD-10-CM | POA: Diagnosis not present

## 2020-02-19 DIAGNOSIS — M9904 Segmental and somatic dysfunction of sacral region: Secondary | ICD-10-CM | POA: Diagnosis not present

## 2020-02-19 DIAGNOSIS — M9902 Segmental and somatic dysfunction of thoracic region: Secondary | ICD-10-CM | POA: Diagnosis not present

## 2020-02-26 DIAGNOSIS — M2550 Pain in unspecified joint: Secondary | ICD-10-CM | POA: Diagnosis not present

## 2020-02-26 DIAGNOSIS — M150 Primary generalized (osteo)arthritis: Secondary | ICD-10-CM | POA: Diagnosis not present

## 2020-03-01 ENCOUNTER — Other Ambulatory Visit: Payer: Self-pay

## 2020-03-01 ENCOUNTER — Ambulatory Visit (HOSPITAL_COMMUNITY)
Admission: RE | Admit: 2020-03-01 | Discharge: 2020-03-01 | Disposition: A | Discharge status: Home / Self Care | Payer: BC Managed Care – PPO | Source: Ambulatory Visit | Attending: Oncology | Admitting: Oncology

## 2020-03-01 DIAGNOSIS — C50411 Malignant neoplasm of upper-outer quadrant of right female breast: Secondary | ICD-10-CM | POA: Insufficient documentation

## 2020-03-01 DIAGNOSIS — N858 Other specified noninflammatory disorders of uterus: Secondary | ICD-10-CM | POA: Diagnosis not present

## 2020-03-01 DIAGNOSIS — Z171 Estrogen receptor negative status [ER-]: Secondary | ICD-10-CM | POA: Diagnosis not present

## 2020-03-01 DIAGNOSIS — C50919 Malignant neoplasm of unspecified site of unspecified female breast: Secondary | ICD-10-CM | POA: Diagnosis not present

## 2020-03-03 ENCOUNTER — Other Ambulatory Visit: Payer: Self-pay | Admitting: Oncology

## 2020-03-03 DIAGNOSIS — R9389 Abnormal findings on diagnostic imaging of other specified body structures: Secondary | ICD-10-CM | POA: Diagnosis not present

## 2020-03-14 DIAGNOSIS — N858 Other specified noninflammatory disorders of uterus: Secondary | ICD-10-CM | POA: Diagnosis not present

## 2020-03-14 DIAGNOSIS — R9389 Abnormal findings on diagnostic imaging of other specified body structures: Secondary | ICD-10-CM | POA: Diagnosis not present

## 2020-03-18 DIAGNOSIS — M9901 Segmental and somatic dysfunction of cervical region: Secondary | ICD-10-CM | POA: Diagnosis not present

## 2020-03-18 DIAGNOSIS — M9904 Segmental and somatic dysfunction of sacral region: Secondary | ICD-10-CM | POA: Diagnosis not present

## 2020-03-18 DIAGNOSIS — M542 Cervicalgia: Secondary | ICD-10-CM | POA: Diagnosis not present

## 2020-03-18 DIAGNOSIS — M9902 Segmental and somatic dysfunction of thoracic region: Secondary | ICD-10-CM | POA: Diagnosis not present

## 2020-03-25 DIAGNOSIS — R1013 Epigastric pain: Secondary | ICD-10-CM | POA: Diagnosis not present

## 2020-03-25 DIAGNOSIS — Z86010 Personal history of colonic polyps: Secondary | ICD-10-CM | POA: Diagnosis not present

## 2020-03-28 IMAGING — CR DG CERVICAL SPINE COMPLETE 4+V
6 series · 6 of 6 positions shown · non-contrast
Comparison: None.

CLINICAL DATA: Cervicalgia with upper extremity radicular symptoms

EXAM:
CERVICAL SPINE - COMPLETE 4+ VIEW

[w c-spine lat]
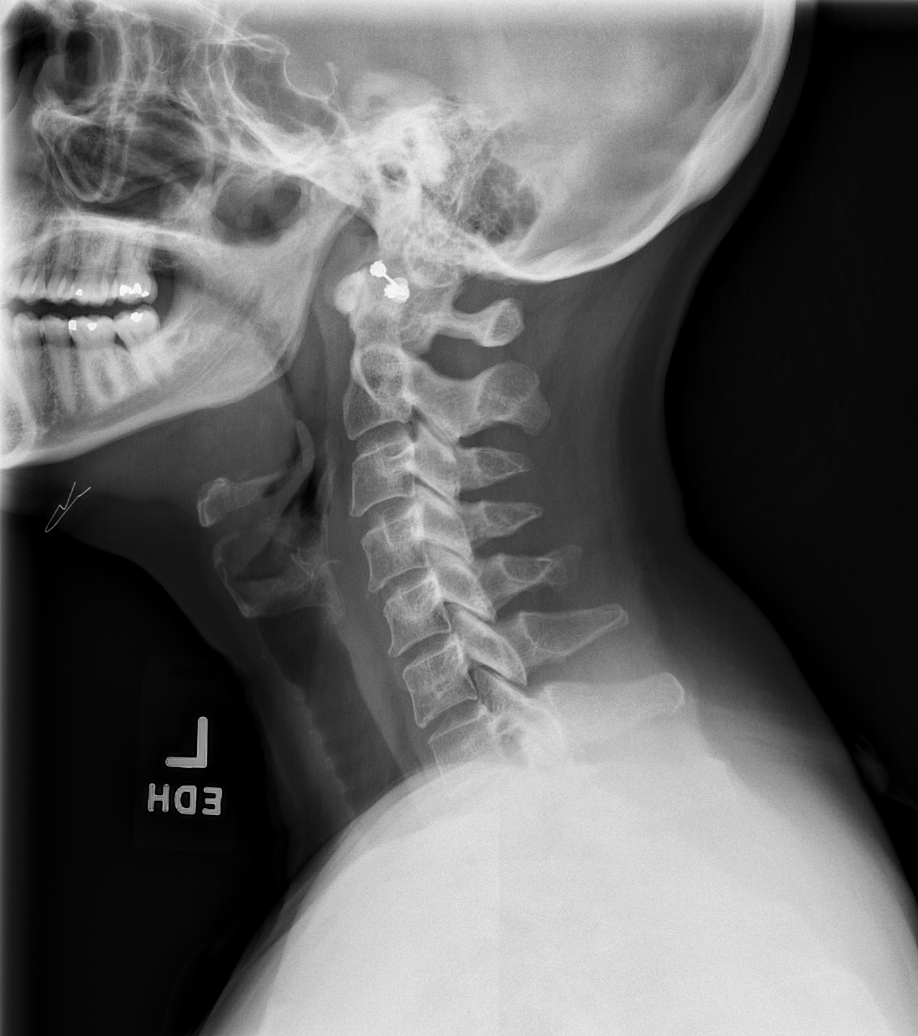

[w c-spine oblique (1 of 3)]
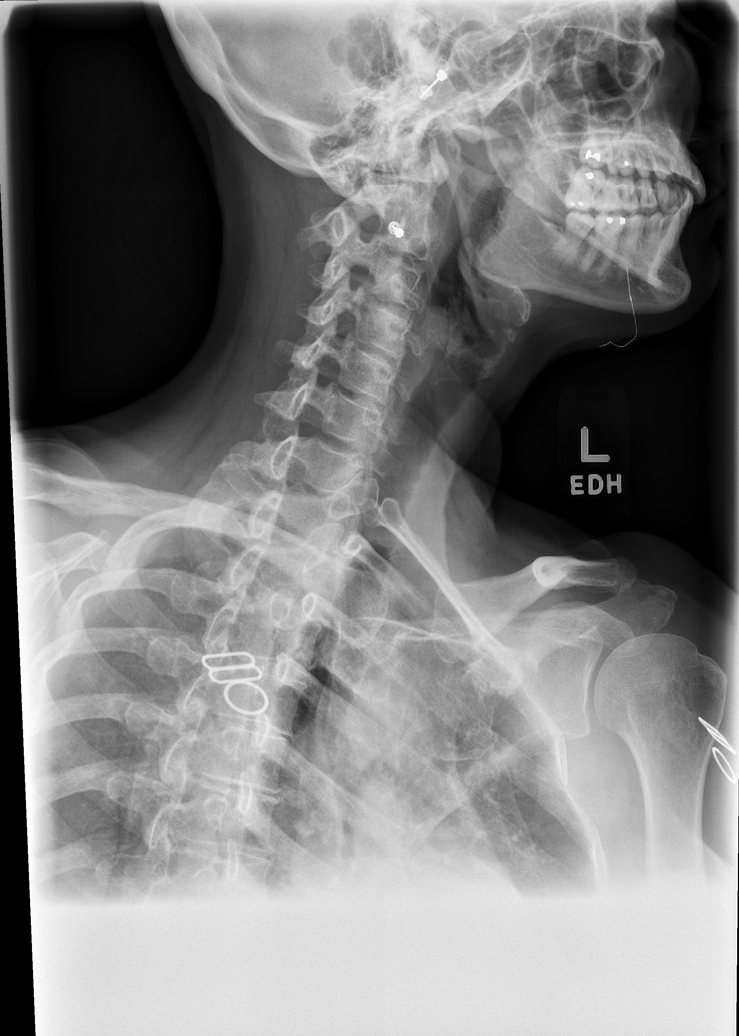

[w c-spine oblique (2 of 3)]
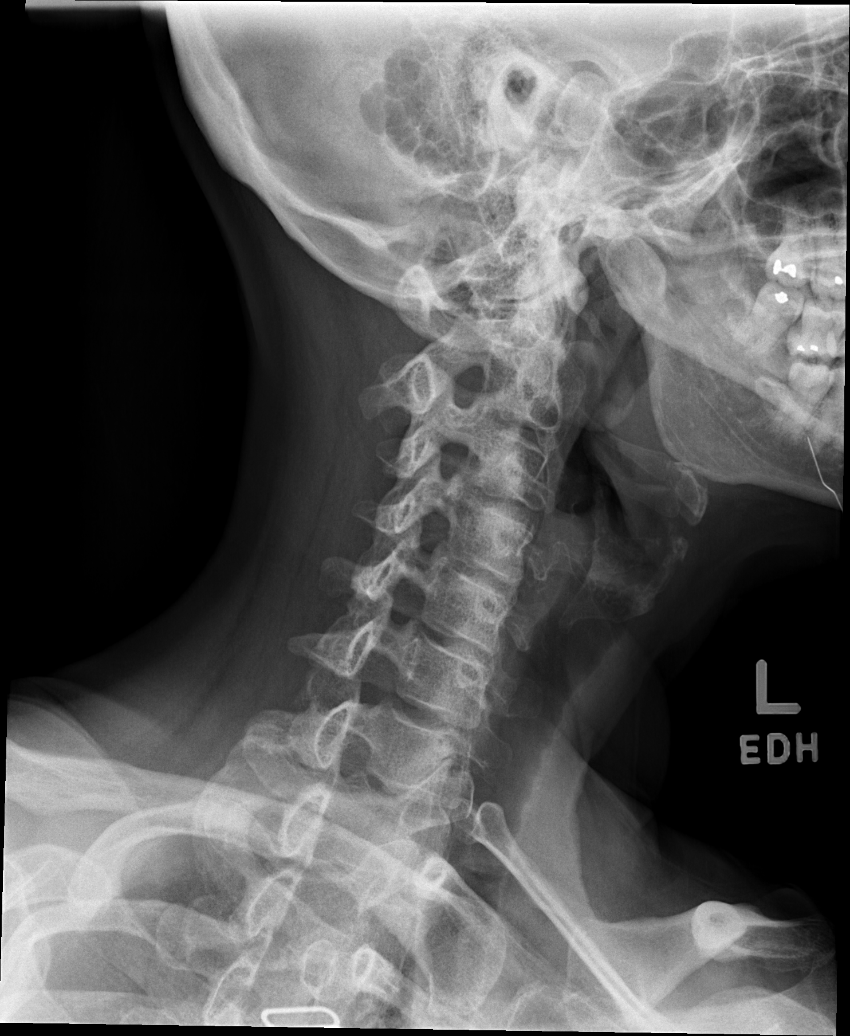

[w c-spine oblique (3 of 3)]
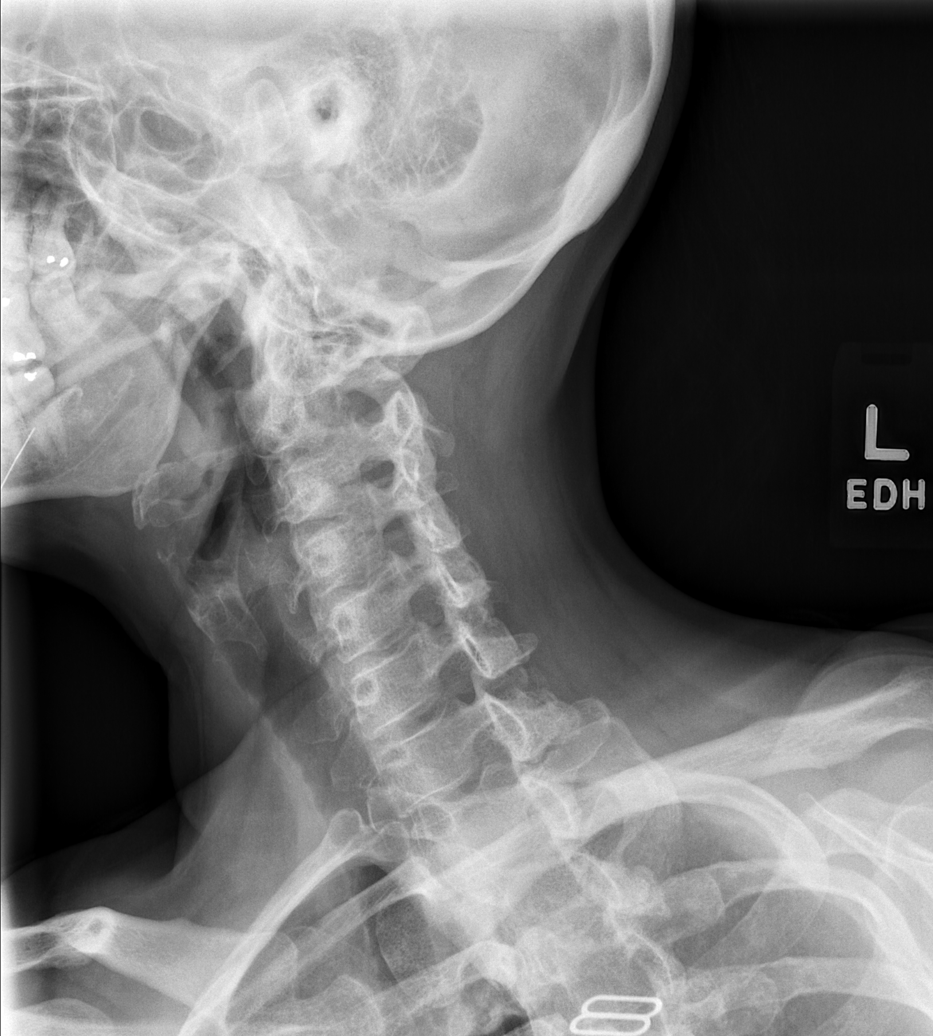

[w c-spine a.p. *]
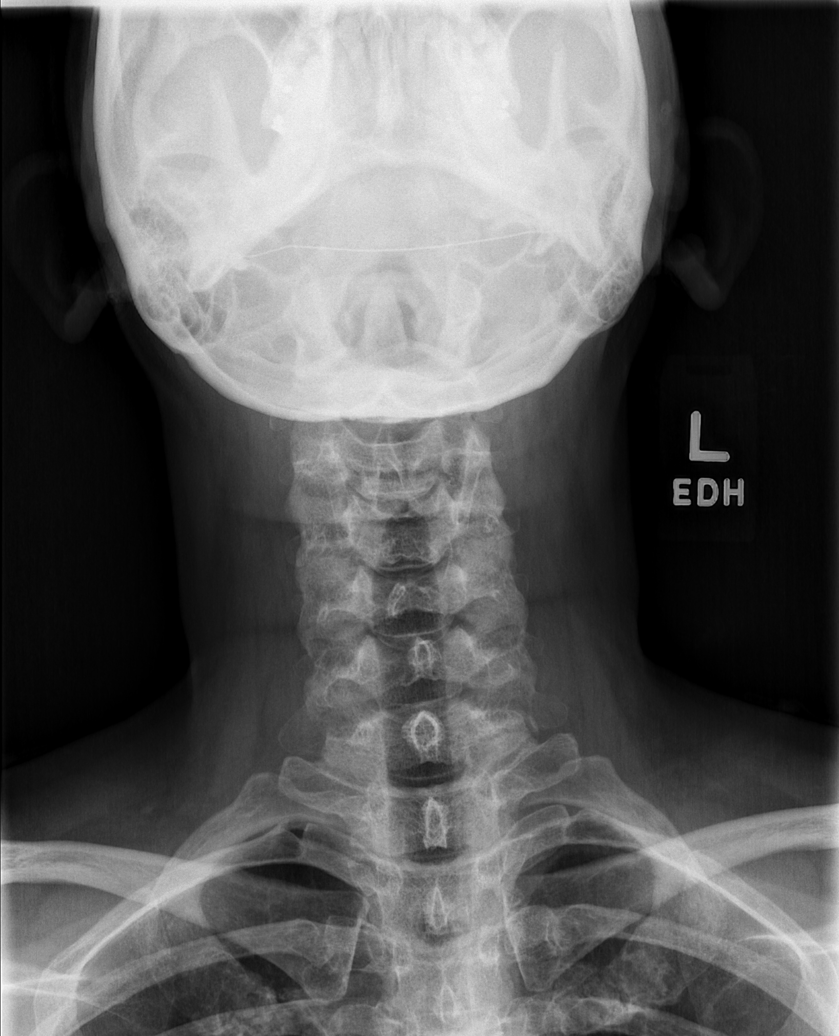

[w c-spine odontoid *]
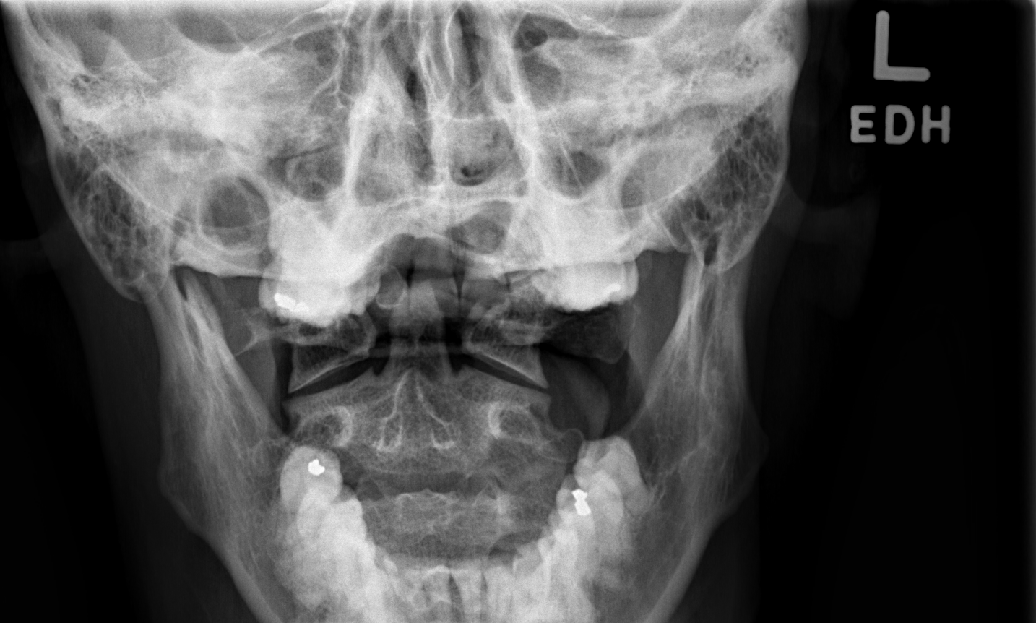

[6 of 6 positions shown; findings below may reference images not displayed]

FINDINGS: Frontal, lateral, open-mouth odontoid, and bilateral oblique views
were obtained. There is no fracture or spondylolisthesis.
Prevertebral soft tissues and predental space regions are normal.
The disc spaces appear unremarkable. There is no appreciable exit
foraminal narrowing on the oblique views. Lung apices are clear.
IMPRESSION: No fracture or spondylolisthesis. No appreciable disc space
narrowing or facet arthropathy.

## 2020-04-15 DIAGNOSIS — M542 Cervicalgia: Secondary | ICD-10-CM | POA: Diagnosis not present

## 2020-04-15 DIAGNOSIS — M9901 Segmental and somatic dysfunction of cervical region: Secondary | ICD-10-CM | POA: Diagnosis not present

## 2020-04-15 DIAGNOSIS — M9904 Segmental and somatic dysfunction of sacral region: Secondary | ICD-10-CM | POA: Diagnosis not present

## 2020-04-15 DIAGNOSIS — M9902 Segmental and somatic dysfunction of thoracic region: Secondary | ICD-10-CM | POA: Diagnosis not present

## 2020-05-10 DIAGNOSIS — H0015 Chalazion left lower eyelid: Secondary | ICD-10-CM | POA: Diagnosis not present

## 2020-05-11 DIAGNOSIS — M9902 Segmental and somatic dysfunction of thoracic region: Secondary | ICD-10-CM | POA: Diagnosis not present

## 2020-05-11 DIAGNOSIS — M542 Cervicalgia: Secondary | ICD-10-CM | POA: Diagnosis not present

## 2020-05-11 DIAGNOSIS — N6092 Unspecified benign mammary dysplasia of left breast: Secondary | ICD-10-CM | POA: Diagnosis not present

## 2020-05-11 DIAGNOSIS — M9901 Segmental and somatic dysfunction of cervical region: Secondary | ICD-10-CM | POA: Diagnosis not present

## 2020-05-11 DIAGNOSIS — M9904 Segmental and somatic dysfunction of sacral region: Secondary | ICD-10-CM | POA: Diagnosis not present

## 2020-05-11 DIAGNOSIS — C50911 Malignant neoplasm of unspecified site of right female breast: Secondary | ICD-10-CM | POA: Diagnosis not present

## 2020-06-02 DIAGNOSIS — M9901 Segmental and somatic dysfunction of cervical region: Secondary | ICD-10-CM | POA: Diagnosis not present

## 2020-06-02 DIAGNOSIS — M9902 Segmental and somatic dysfunction of thoracic region: Secondary | ICD-10-CM | POA: Diagnosis not present

## 2020-06-02 DIAGNOSIS — M9904 Segmental and somatic dysfunction of sacral region: Secondary | ICD-10-CM | POA: Diagnosis not present

## 2020-06-02 DIAGNOSIS — M542 Cervicalgia: Secondary | ICD-10-CM | POA: Diagnosis not present

## 2020-06-09 DIAGNOSIS — L814 Other melanin hyperpigmentation: Secondary | ICD-10-CM | POA: Diagnosis not present

## 2020-06-09 DIAGNOSIS — D225 Melanocytic nevi of trunk: Secondary | ICD-10-CM | POA: Diagnosis not present

## 2020-06-09 DIAGNOSIS — L570 Actinic keratosis: Secondary | ICD-10-CM | POA: Diagnosis not present

## 2020-06-09 DIAGNOSIS — Z85828 Personal history of other malignant neoplasm of skin: Secondary | ICD-10-CM | POA: Diagnosis not present

## 2020-06-09 DIAGNOSIS — L700 Acne vulgaris: Secondary | ICD-10-CM | POA: Diagnosis not present

## 2020-07-01 DIAGNOSIS — M9901 Segmental and somatic dysfunction of cervical region: Secondary | ICD-10-CM | POA: Diagnosis not present

## 2020-07-01 DIAGNOSIS — M9902 Segmental and somatic dysfunction of thoracic region: Secondary | ICD-10-CM | POA: Diagnosis not present

## 2020-07-01 DIAGNOSIS — M542 Cervicalgia: Secondary | ICD-10-CM | POA: Diagnosis not present

## 2020-07-01 DIAGNOSIS — M9904 Segmental and somatic dysfunction of sacral region: Secondary | ICD-10-CM | POA: Diagnosis not present

## 2020-07-26 DIAGNOSIS — Z20822 Contact with and (suspected) exposure to covid-19: Secondary | ICD-10-CM | POA: Diagnosis not present

## 2020-07-28 DIAGNOSIS — J22 Unspecified acute lower respiratory infection: Secondary | ICD-10-CM | POA: Diagnosis not present

## 2020-08-03 ENCOUNTER — Encounter (INDEPENDENT_AMBULATORY_CARE_PROVIDER_SITE_OTHER): Payer: Self-pay | Admitting: Internal Medicine

## 2020-08-03 ENCOUNTER — Other Ambulatory Visit: Payer: Self-pay

## 2020-08-03 ENCOUNTER — Ambulatory Visit (INDEPENDENT_AMBULATORY_CARE_PROVIDER_SITE_OTHER): Payer: BC Managed Care – PPO | Admitting: Internal Medicine

## 2020-08-03 VITALS — BP 128/70 | HR 87 | Temp 97.0°F | Resp 18 | Ht 65.0 in | Wt 165.4 lb

## 2020-08-03 DIAGNOSIS — F520 Hypoactive sexual desire disorder: Secondary | ICD-10-CM | POA: Diagnosis not present

## 2020-08-03 DIAGNOSIS — E2839 Other primary ovarian failure: Secondary | ICD-10-CM | POA: Diagnosis not present

## 2020-08-03 DIAGNOSIS — R5381 Other malaise: Secondary | ICD-10-CM

## 2020-08-03 DIAGNOSIS — R5383 Other fatigue: Secondary | ICD-10-CM | POA: Diagnosis not present

## 2020-08-03 DIAGNOSIS — Z13220 Encounter for screening for lipoid disorders: Secondary | ICD-10-CM | POA: Diagnosis not present

## 2020-08-03 DIAGNOSIS — Z131 Encounter for screening for diabetes mellitus: Secondary | ICD-10-CM | POA: Diagnosis not present

## 2020-08-03 DIAGNOSIS — E559 Vitamin D deficiency, unspecified: Secondary | ICD-10-CM | POA: Diagnosis not present

## 2020-08-03 NOTE — Progress Notes (Signed)
Metrics: Intervention Frequency ACO  Documented Smoking Status Yearly  Screened one or more times in 24 months  Cessation Counseling or  Active cessation medication Past 24 months  Past 24 months   Guideline developer: UpToDate (See UpToDate for funding source) Date Released: 2014       Wellness Office Visit  Subjective:  Patient ID: Jennifer Beck, female    DOB: 08/02/1965  Age: 55 y.o. MRN: [id number]  CC: This pleasant 55 year old lady comes as a new patient to establish care. HPI  She had bilateral mastectomy in 2018 for breast cancer.  Apparently was triple negative. She also has a previous history of uterine ablation. She says that after she had chemotherapy for the breast cancer, she underwent menopause and started to get hot flashes, night sweats, decreased libido, dyspareunia due to vaginal dryness. In 2019 I believe, she was started on estradiol patch but there was no progesterone added at that time.  More recently progesterone has been added.  At 1 point, she had also taking testosterone pellets which made actually her feel better with increased libido, improvement in vagina dryness and energy levels.  This was discontinued. She feels now that she has no energy, libido is extremely low.  Progesterone does seem to help her sleep better. She still has somewhat fatigue. Past Medical History:  Diagnosis Date  . Arthritis    leg and muscle pains  . Chest pain   . Complication of anesthesia   . Endometriosis   . Family history of breast cancer 04/22/2017  . Family history of colon cancer 04/22/2017  . Family history of kidney cancer 04/22/2017  . Fatigue   . GERD (gastroesophageal reflux disease)   . GI bleed    caused by ischemic colitis following an episode of hypertension  . Heart palpitations   . Hx of echocardiogram    a. echo 9/13:  EF 55-60%, Gr 2 diast dysfn  . Hypercholesterolemia   . Ischemic colitis ([place])   . Myocarditis ([place])   . Pericarditis   . PONV  (postoperative nausea and vomiting)   . Restrictive pericarditis 2004  . SOB (shortness of breath)    Past Surgical History:  Procedure Laterality Date  . BREAST RECONSTRUCTION WITH PLACEMENT OF TISSUE EXPANDER AND ALLODERM Bilateral 10/08/2017   Procedure: BREAST RECONSTRUCTION WITH PLACEMENT OF TISSUE EXPANDER AND ALLODERM;  Surgeon: [name], MD;  Location: [place];  Service: Plastics;  Laterality: Bilateral;  . CARDIAC CATHETERIZATION  08/26/2001   EF of 60% --- smooth & normal coronary arteries -- there is a Klunder motion defect consistent with posterolateral MI -- she quite possibly had a coronary spasm -- she may have some pericarditis secondary to her MI   . DILATION AND CURETTAGE OF UTERUS  10/15/2003   Uterine enlargement, menorrhagia  . DILATION AND CURETTAGE OF UTERUS  05/27/2002   Dysfunctional uterine bleeding  . LAPAROSCOPY    . LIPOSUCTION WITH LIPOFILLING Bilateral 01/17/2018   Procedure: LIPOFILLING FROM ABDOMEN TO CHEST;  Surgeon: [name], MD;  Location: [place];  Service: Plastics;  Laterality: Bilateral;  . MASTECTOMY W/ SENTINEL NODE BIOPSY Bilateral 10/08/2017   Procedure: RIGHT MASTECTOMY WITH RIGHT AXILLARY SENTINEL LYMPH NODE BIOPSY AND LEFT PROPHYLACTIC MASTECTOMY;  Surgeon: [name], MD;  Location: [place];  Service: General;  Laterality: Bilateral;  . NOVASURE ABLATION  10/15/2003   for management of her extreme menorrhagi  . PORTACATH PLACEMENT Right 04/02/2017   Procedure: INSERTION PORT-A-CATH;  Surgeon: [name], MD;  Location: [facility];  Service: General;  Laterality: Right;  . REMOVAL OF BILATERAL TISSUE EXPANDERS WITH PLACEMENT OF BILATERAL BREAST IMPLANTS Bilateral 01/17/2018   Procedure: REMOVAL OF BILATERAL TISSUE EXPANDERS WITH PLACEMENT OF BILATERAL SILICONE BREAST IMPLANTS;  Surgeon: [name], MD;  Location: [place];  Service: Plastics;  Laterality:  Bilateral;  . TUBAL LIGATION  09/2000   bilateral     Family History  Problem Relation Age of Onset  . Hypertension Father   . Diabetes Father   . Other Father        stomach mass suspicious for ca, never bx  . Coronary artery disease Mother   . Kidney cancer Mother 23  . Coronary artery disease Maternal Grandfather   . Colon cancer Maternal Grandfather 61       recurrence  . Coronary artery disease Maternal Uncle   . Coronary artery disease Maternal Uncle   . Diabetes Maternal Uncle   . Colon cancer Maternal Grandmother 58  . Breast cancer Cousin 51       had GT- CHEK2+  . Cervical cancer Maternal Aunt 35  . Diabetes Maternal Aunt   . Alzheimer's disease Paternal Grandmother   . Colon cancer Paternal Grandfather 46  . Breast cancer Paternal Aunt   . Breast cancer Paternal Aunt   . Breast cancer Other 66    Social History   Social History Narrative   She works as Scientist, product/process development.   She lives at home with husband. Married since 1998. They have 1 daughter   Highest level of education: associates   Social History   Tobacco Use  . Smoking status: Never Smoker  . Smokeless tobacco: Never Used  Substance Use Topics  . Alcohol use: Yes    Comment: Occasionally drinks wine    Current Meds  Medication Sig  . albuterol (VENTOLIN HFA) 108 (90 Base) MCG/ACT inhaler 1 puff as needed  . ALPRAZolam (XANAX) 0.25 MG tablet Take 0.25 mg by mouth daily at 10 pm. Only at bedtime as need  . Carboxymeth-Glyc-Polysorb PF (REFRESH OPTIVE MEGA-3) 0.5-1-0.5 % SOLN   . Cholecalciferol (VITAMIN D) 2000 units CAPS Take 2,000 Units by mouth daily.  [name] estradiol (VIVELLE-DOT) 0.1 MG/24HR patch USE 1 PATCH 2 TIMES A WEEK  . famotidine (PEPCID) 20 MG tablet 1 tablet  . Multiple Minerals-Vitamins (CALCIUM-MAGNESIUM-ZINC) TABS Take 1 tablet by mouth daily.   . Multiple Vitamins-Minerals (MULTIVITAMINS THER. W/MINERALS) TABS Take 1 tablet by mouth daily.   . Omega-3 Fatty Acids (FISH OIL) 1200  MG CAPS 1 capsule  . Probiotic Product (PROBIOTIC BLEND PO) 1 capsule  . progesterone (PROMETRIUM) 100 MG capsule Take 100 mg by mouth at bedtime.  . [DISCONTINUED] estradiol (VIVELLE-DOT) 0.1 MG/24HR patch 1 patch by Other route every 7 (seven) weeks.       Objective:   Today's Vitals: BP 128/70 (BP Location: Left Arm, Patient Position: Sitting, Cuff Size: Normal)   Pulse 87   Temp (!) 97 F (36.1 C) (Temporal)   Resp 18   Ht _0  (1.651 m)   Wt 165 lb 6.4 oz (75 kg)   SpO2 100%   BMI 27.52 kg/m  Vitals with BMI 08/03/2020 10/08/2019 11/11/2018  Height _1  _2  _3   Weight 165 lbs 6 oz 160 lbs 3 oz 157 lbs 8 oz  BMI 27.52 39.53 20.23  Systolic 343 568 616  Diastolic 70 67 48  Pulse 87 82 71  Physical Exam  Pleasant lady.  Slightly overweight.  Blood pressure in a good range.     Assessment   1. Hypoactive sexual desire disorder   2. Primary ovarian failure   3. Malaise and fatigue   4. Vitamin D deficiency disease   5. Screening for lipoid disorders   6. Screening for diabetes mellitus       Tests ordered Orders Placed This Encounter  Procedures  . CBC  . COMPLETE METABOLIC PANEL WITH GFR  . DHEA-sulfate  . Estradiol  . Progesterone  . T3, free  . T4, free  . TSH  . Testos,Total,Free and SHBG (Female)  . VITAMIN D 25 Hydroxy (Vit-D Deficiency, Fractures)  . Lipid panel  . Hemoglobin A1c     Plan: 1. Today I discussed her symptoms and the difference between bioidentical and synthetic hormones.  We will check all the levels today and I will review with her all of these when I see her next time. 2. In the meantime, she will continue with estradiol patch and progesterone orally.   No orders of the defined types were placed in this encounter.   Doree Albee, MD

## 2020-08-08 LAB — ESTRADIOL: Estradiol: 104 pg/mL

## 2020-08-08 LAB — VITAMIN D 25 HYDROXY (VIT D DEFICIENCY, FRACTURES): Vit D, 25-Hydroxy: 64 ng/mL (ref 30–100)

## 2020-08-08 LAB — COMPLETE METABOLIC PANEL WITH GFR
AG Ratio: 1.8 (calc) (ref 1.0–2.5)
ALT: 14 U/L (ref 6–29)
AST: 17 U/L (ref 10–35)
Albumin: 4.6 g/dL (ref 3.6–5.1)
Alkaline phosphatase (APISO): 64 U/L (ref 37–153)
BUN: 13 mg/dL (ref 7–25)
CO2: 26 mmol/L (ref 20–32)
Calcium: 9.9 mg/dL (ref 8.6–10.4)
Chloride: 103 mmol/L (ref 98–110)
Creat: 0.62 mg/dL (ref 0.50–1.05)
GFR, Est African American: 120 mL/min/{1.73_m2} (ref >=60)
GFR, Est Non African American: 104 mL/min/{1.73_m2} (ref >=60)
Globulin: 2.6 g/dL (calc) (ref 1.9–3.7)
Glucose, Bld: 85 mg/dL (ref 65–99)
Potassium: 4.7 mmol/L (ref 3.5–5.3)
Sodium: 140 mmol/L (ref 135–146)
Total Bilirubin: 0.2 mg/dL (ref 0.2–1.2)
Total Protein: 7.2 g/dL (ref 6.1–8.1)

## 2020-08-08 LAB — LIPID PANEL
Cholesterol: 196 mg/dL (ref <200)
HDL: 48 mg/dL — ABNORMAL LOW (ref >=50)
LDL Cholesterol (Calc): 113 mg/dL (calc) — ABNORMAL HIGH
Non-HDL Cholesterol (Calc): 148 mg/dL (calc) — ABNORMAL HIGH (ref <130)
Total CHOL/HDL Ratio: 4.1 (calc) (ref <5.0)
Triglycerides: 234 mg/dL — ABNORMAL HIGH (ref <150)

## 2020-08-08 LAB — CBC
HCT: 40.6 % (ref 35.0–45.0)
Hemoglobin: 14.1 g/dL (ref 11.7–15.5)
MCH: 32.6 pg (ref 27.0–33.0)
MCHC: 34.7 g/dL (ref 32.0–36.0)
MCV: 93.8 fL (ref 80.0–100.0)
MPV: 9.1 fL (ref 7.5–12.5)
Platelets: 444 10*3/uL — ABNORMAL HIGH (ref 140–400)
RBC: 4.33 10*6/uL (ref 3.80–5.10)
RDW: 11.5 % (ref 11.0–15.0)
WBC: 7.8 10*3/uL (ref 3.8–10.8)

## 2020-08-08 LAB — HEMOGLOBIN A1C
Hgb A1c MFr Bld: 5.4 % of total Hgb (ref <5.7)
Mean Plasma Glucose: 108 mg/dL
eAG (mmol/L): 6 mmol/L

## 2020-08-08 LAB — TESTOS,TOTAL,FREE AND SHBG (FEMALE)
Free Testosterone: 1.2 pg/mL (ref 0.1–6.4)
Sex Hormone Binding: 48 nmol/L (ref 17–124)
Testosterone, Total, LC-MS-MS: 10 ng/dL (ref 2–45)

## 2020-08-08 LAB — DHEA-SULFATE: DHEA-SO4: 80 ug/dL (ref 5–167)

## 2020-08-08 LAB — PROGESTERONE: Progesterone: 2.3 ng/mL

## 2020-08-08 LAB — TSH: TSH: 2.14 mIU/L

## 2020-08-08 LAB — T4, FREE: Free T4: 1.1 ng/dL (ref 0.8–1.8)

## 2020-08-08 LAB — T3, FREE: T3, Free: 4 pg/mL (ref 2.3–4.2)

## 2020-08-24 ENCOUNTER — Encounter (INDEPENDENT_AMBULATORY_CARE_PROVIDER_SITE_OTHER): Payer: Self-pay | Admitting: Internal Medicine

## 2020-08-24 ENCOUNTER — Ambulatory Visit (INDEPENDENT_AMBULATORY_CARE_PROVIDER_SITE_OTHER): Payer: BC Managed Care – PPO | Admitting: Internal Medicine

## 2020-08-24 ENCOUNTER — Other Ambulatory Visit: Payer: Self-pay

## 2020-08-24 VITALS — BP 118/64 | HR 93 | Temp 97.5°F | Resp 18 | Ht 65.0 in | Wt 157.0 lb

## 2020-08-24 DIAGNOSIS — R5381 Other malaise: Secondary | ICD-10-CM

## 2020-08-24 DIAGNOSIS — F520 Hypoactive sexual desire disorder: Secondary | ICD-10-CM

## 2020-08-24 DIAGNOSIS — E2839 Other primary ovarian failure: Secondary | ICD-10-CM | POA: Diagnosis not present

## 2020-08-24 DIAGNOSIS — R5383 Other fatigue: Secondary | ICD-10-CM

## 2020-08-24 DIAGNOSIS — E785 Hyperlipidemia, unspecified: Secondary | ICD-10-CM | POA: Diagnosis not present

## 2020-08-24 MED ORDER — TESTOSTERONE 1.62 % TD GEL: 5.0000 mg | Freq: Every day | TRANSDERMAL | 1 refills | Status: DC

## 2020-08-24 MED ORDER — PROGESTERONE 200 MG PO CAPS: 200.0000 mg | ORAL_CAPSULE | Freq: Every day | ORAL | 3 refills | Status: DC

## 2020-08-24 MED ORDER — ESTRADIOL 1 MG PO TABS: 1.0000 mg | ORAL_TABLET | Freq: Every day | ORAL | 3 refills | Status: DC

## 2020-08-24 NOTE — Patient Instructions (Signed)
[facility] Dietary Recommendations for Weight Loss What to Avoid . Avoid added sugars o Often added sugar can be found in processed foods such as many condiments, dry cereals, cakes, cookies, chips, crisps, crackers, candies, sweetened drinks, etc.  o Read labels and AVOID/DECREASE use of foods with the following in their ingredient list: Sugar, fructose, high fructose corn syrup, sucrose, glucose, maltose, dextrose, molasses, cane sugar, brown sugar, any type of syrup, agave nectar, etc.   . Avoid snacking in between meals . Avoid foods made with flour o If you are going to eat food made with flour, choose those made with whole-grains; and, minimize your consumption as much as is tolerable . Avoid processed foods o These foods are generally stocked in the middle of the grocery store. Focus on shopping on the perimeter of the grocery.  . Avoid Meat  o We recommend following a plant-based diet at [facility]. Thus, we recommend avoiding meat as a general rule. Consider eating beans, legumes, eggs, and/or dairy products for regular protein sources o If you plan on eating meat limit to 4 ounces of meat at a time and choose lean options such as Fish, chicken, turkey. Avoid red meat intake such as pork and/or steak What to Include . Vegetables o GREEN LEAFY VEGETABLES: Kale, spinach, mustard greens, collard greens, cabbage, broccoli, etc. o OTHER: Asparagus, cauliflower, eggplant, carrots, peas, Brussel sprouts, tomatoes, bell peppers, zucchini, beets, cucumbers, etc. . Grains, seeds, and legumes o Beans: kidney beans, black eyed peas, garbanzo beans, black beans, pinto beans, etc. o Whole, unrefined grains: brown rice, barley, bulgur, oatmeal, etc. . Healthy fats  o Avoid highly processed fats such as vegetable oil o Examples of healthy fats: avocado, olives, virgin olive oil, dark chocolate (?72% Cocoa), nuts (peanuts, almonds, walnuts, cashews, pecans, etc.) . None to Low  Intake of Animal Sources of Protein o Meat sources: chicken, turkey, salmon, tuna. Limit to 4 ounces of meat at one time. o Consider limiting dairy sources, but when choosing dairy focus on: PLAIN Greek yogurt, cottage cheese, high-protein milk . Fruit o Choose berries  When to Eat . Intermittent Fasting: o Choosing not to eat for a specific time period, but DO FOCUS ON HYDRATION when fasting o Multiple Techniques: - Time Restricted Eating: eat 3 meals in a day, each meal lasting no more than 60 minutes, no snacks between meals - 16-18 hour fast: fast for 16 to 18 hours up to 7 days a week. Often suggested to start with 2-3 nonconsecutive days per week.  . Remember the time you sleep is counted as fasting.  . Examples of eating schedule: Fast from 7:00pm-11:00am. Eat between 11:00am-7:00pm.  - 24-hour fast: fast for 24 hours up to every other day. Often suggested to start with 1 day per week . Remember the time you sleep is counted as fasting . Examples of eating schedule:  o Eating day: eat 2-3 meals on your eating day. If doing 2 meals, each meal should last no more than 90 minutes. If doing 3 meals, each meal should last no more than 60 minutes. Finish last meal by 7:00pm. o Fasting day: Fast until 7:00pm.  o IF YOU FEEL UNWELL FOR ANY REASON/IN ANY WAY WHEN FASTING, STOP FASTING BY EATING A NUTRITIOUS SNACK OR LIGHT MEAL o ALWAYS FOCUS ON HYDRATION DURING FASTS - Acceptable Hydration sources: water, broths, tea/coffee (black tea/coffee is best but using a small amount of whole-fat dairy products in coffee/tea is acceptable).  -   Poor Hydration Sources: anything with sugar or artificial sweeteners added to it  These recommendations have been developed for patients that are actively receiving medical care from either Dr. Deidrick Rainey or Sarah Gray, DNP, NP-C at [facility]. These recommendations are developed for patients with specific medical conditions and are not meant to be  distributed or used by others that are not actively receiving care from either provider listed above at [facility]. It is not appropriate to participate in the above eating plans without proper medical supervision.   Reference: Fung, J. The obesity code. [place]: [place]; 2016.   

## 2020-08-24 NOTE — Progress Notes (Signed)
Metrics: Intervention Frequency ACO  Documented Smoking Status Yearly  Screened one or more times in 24 months  Cessation Counseling or  Active cessation medication Past 24 months  Past 24 months   Guideline developer: UpToDate (See UpToDate for funding source) Date Released: 2014       Wellness Office Visit  Subjective:  Patient ID: Jennifer Beck, female    DOB: 15-Feb-1966  Age: 55 y.o. MRN: [id number]  CC: This lady comes in to discuss all her blood work and further recommendations. HPI  She has dyslipidemia with low HDL and elevated triglycerides.  Testosterone levels are suboptimal.  Progesterone levels are suboptimal but estradiol levels are in a good range.  Vitamin D levels are also somewhat suboptimal but not too bad. She continues to have the symptoms that she described before which include dyspareunia, vagina dryness, decreased libido.  She does not sleep well. Past Medical History:  Diagnosis Date  . Arthritis    leg and muscle pains  . Chest pain   . Complication of anesthesia   . Endometriosis   . Family history of breast cancer 04/22/2017  . Family history of colon cancer 04/22/2017  . Family history of kidney cancer 04/22/2017  . Fatigue   . GERD (gastroesophageal reflux disease)   . GI bleed    caused by ischemic colitis following an episode of hypertension  . Heart palpitations   . Hx of echocardiogram    a. echo 9/13:  EF 55-60%, Gr 2 diast dysfn  . Hypercholesterolemia   . Ischemic colitis ([place])   . Myocarditis ([place])   . Pericarditis   . PONV (postoperative nausea and vomiting)   . Restrictive pericarditis 2004  . SOB (shortness of breath)    Past Surgical History:  Procedure Laterality Date  . BREAST RECONSTRUCTION WITH PLACEMENT OF TISSUE EXPANDER AND ALLODERM Bilateral 10/08/2017   Procedure: BREAST RECONSTRUCTION WITH PLACEMENT OF TISSUE EXPANDER AND ALLODERM;  Surgeon: [name], MD;  Location: [place];  Service:  Plastics;  Laterality: Bilateral;  . CARDIAC CATHETERIZATION  08/26/2001   EF of 60% --- smooth & normal coronary arteries -- there is a Sells motion defect consistent with posterolateral MI -- she quite possibly had a coronary spasm -- she may have some pericarditis secondary to her MI   . DILATION AND CURETTAGE OF UTERUS  10/15/2003   Uterine enlargement, menorrhagia  . DILATION AND CURETTAGE OF UTERUS  05/27/2002   Dysfunctional uterine bleeding  . LAPAROSCOPY    . LIPOSUCTION WITH LIPOFILLING Bilateral 01/17/2018   Procedure: LIPOFILLING FROM ABDOMEN TO CHEST;  Surgeon: [name], MD;  Location: [place];  Service: Plastics;  Laterality: Bilateral;  . MASTECTOMY W/ SENTINEL NODE BIOPSY Bilateral 10/08/2017   Procedure: RIGHT MASTECTOMY WITH RIGHT AXILLARY SENTINEL LYMPH NODE BIOPSY AND LEFT PROPHYLACTIC MASTECTOMY;  Surgeon: [name], MD;  Location: [place];  Service: General;  Laterality: Bilateral;  . NOVASURE ABLATION  10/15/2003   for management of her extreme menorrhagi  . PORTACATH PLACEMENT Right 04/02/2017   Procedure: INSERTION PORT-A-CATH;  Surgeon: [name], MD;  Location[facility];  Service: General;  Laterality: Right;  . REMOVAL OF BILATERAL TISSUE EXPANDERS WITH PLACEMENT OF BILATERAL BREAST IMPLANTS Bilateral 01/17/2018   Procedure: REMOVAL OF BILATERAL TISSUE EXPANDERS WITH PLACEMENT OF BILATERAL SILICONE BREAST IMPLANTS;  Surgeon: [name], MD;  Location: [place];  Service: Plastics;  Laterality: Bilateral;  . TUBAL LIGATION  09/2000   bilateral  Family History  Problem Relation Age of Onset  . Hypertension Father   . Diabetes Father   . Other Father        stomach mass suspicious for ca, never bx  . Coronary artery disease Mother   . Kidney cancer Mother 48  . Coronary artery disease Maternal Grandfather   . Colon cancer Maternal Grandfather 61       recurrence  . Coronary artery  disease Maternal Uncle   . Coronary artery disease Maternal Uncle   . Diabetes Maternal Uncle   . Colon cancer Maternal Grandmother 18  . Breast cancer Cousin 64       had GT- CHEK2+  . Cervical cancer Maternal Aunt 35  . Diabetes Maternal Aunt   . Alzheimer's disease Paternal Grandmother   . Colon cancer Paternal Grandfather 30  . Breast cancer Paternal Aunt   . Breast cancer Paternal Aunt   . 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 Married since 1998. They have 1 daughter   Highest level of education: associates   Social History   Tobacco Use  . Smoking status: Never Smoker  . Smokeless tobacco: Never Used  Substance Use Topics  . Alcohol use: Yes    Comment: Occasionally drinks wine    Current Meds  Medication Sig  . ALPRAZolam (XANAX) 0.25 MG tablet Take 0.25 mg by mouth daily at 10 pm. Only at bedtime as need  . Carboxymeth-Glyc-Polysorb PF (REFRESH OPTIVE MEGA-3) 0.5-1-0.5 % SOLN   . Cholecalciferol (VITAMIN D) 2000 units CAPS Take 2,000 Units by mouth daily.  [name] estradiol (ESTRACE) 1 MG tablet Take 1 tablet (1 mg total) by mouth daily.  . famotidine (PEPCID) 20 MG tablet 1 tablet  . Multiple Minerals-Vitamins (CALCIUM-MAGNESIUM-ZINC) TABS Take 1 tablet by mouth daily.   . Multiple Vitamins-Minerals (MULTIVITAMINS THER. W/MINERALS) TABS Take 1 tablet by mouth daily.   . Omega-3 Fatty Acids (FISH OIL) 1200 MG CAPS 1 capsule  . Probiotic Product (PROBIOTIC BLEND PO) 1 capsule  . progesterone (PROMETRIUM) 200 MG capsule Take 1 capsule (200 mg total) by mouth daily.  . Testosterone 1.62 % GEL Place 5 mg onto the skin daily.  . [DISCONTINUED] estradiol (VIVELLE-DOT) 0.1 MG/24HR patch USE 1 PATCH 2 TIMES A WEEK  . [DISCONTINUED] progesterone (PROMETRIUM) 100 MG capsule Take 100 mg by mouth at bedtime.       Objective:   Today's Vitals: BP 118/64 (BP Location: Left Arm,  Patient Position: Sitting, Cuff Size: Normal)   Pulse 93   Temp (!) 97.5 F (36.4 C) (Temporal)   Resp 18   Ht _0  (1.651 m)   Wt 157 lb (71.2 kg)   SpO2 100%   BMI 26.13 kg/m  Vitals with BMI 08/24/2020 08/03/2020 10/08/2019  Height _1  _2  _3   Weight 157 lbs 165 lbs 6 oz 160 lbs 3 oz  BMI 26.13 18.84 16.60  Systolic 630 160 109  Diastolic 64 70 67  Pulse 93 87 82     Physical Exam  She is overweight but has lost weight since last time we met.  Blood pressure is in a good range.     Assessment   1. Hypoactive sexual desire disorder   2. Primary ovarian failure   3. Malaise and fatigue   4. Dyslipidemia       Tests ordered No  orders of the defined types were placed in this encounter.    Plan: 1. We discussed testosterone therapy that I think would greatly benefit her overall quality of life.  She is agreeable to this and after shared decision making, she would like to try testosterone cream.  She will take testosterone cream applied to the labia 5 mg daily.  I discussed possible side effects and she will let me know if she has problems. 2. We discussed the importance of oral estradiol in studies that show decreased risk of coronary artery disease and cerebrovascular disease.  This is in comparison to topical estradiol.  She does not really like the topical patches and is willing to try estradiol by mouth.  Therefore, she will discontinue the topical estradiol and start oral estradiol 1 mg daily. 3. Her progesterone levels are suboptimal which puts her at high risk for uterine cancer and we need to get the levels at least to 10.  Therefore, she will increase the progesterone to 200 mg at night. 4. We discussed her dyslipidemia and the importance of nutrition.  I discussed the concept of intermittent fasting combined with a plant-based diet.  I have given her diet sheet. 5. Follow-up with me in a couple of months to see how she is doing.  We will check levels then.  I  spent 45 minutes with this patient today discussing all of the above.   Meds ordered this encounter  Medications  . progesterone (PROMETRIUM) 200 MG capsule    Sig: Take 1 capsule (200 mg total) by mouth daily.    Dispense:  30 capsule    Refill:  3  . estradiol (ESTRACE) 1 MG tablet    Sig: Take 1 tablet (1 mg total) by mouth daily.    Dispense:  30 tablet    Refill:  3  . Testosterone 1.62 % GEL    Sig: Place 5 mg onto the skin daily.    Dispense:  30 g    Refill:  1    Sheilia Reznick Luther Parody, MD

## 2020-08-30 DIAGNOSIS — M9901 Segmental and somatic dysfunction of cervical region: Secondary | ICD-10-CM | POA: Diagnosis not present

## 2020-08-30 DIAGNOSIS — M9904 Segmental and somatic dysfunction of sacral region: Secondary | ICD-10-CM | POA: Diagnosis not present

## 2020-08-30 DIAGNOSIS — M542 Cervicalgia: Secondary | ICD-10-CM | POA: Diagnosis not present

## 2020-08-30 DIAGNOSIS — M9902 Segmental and somatic dysfunction of thoracic region: Secondary | ICD-10-CM | POA: Diagnosis not present

## 2020-09-27 DIAGNOSIS — M9904 Segmental and somatic dysfunction of sacral region: Secondary | ICD-10-CM | POA: Diagnosis not present

## 2020-09-27 DIAGNOSIS — M542 Cervicalgia: Secondary | ICD-10-CM | POA: Diagnosis not present

## 2020-09-27 DIAGNOSIS — M9901 Segmental and somatic dysfunction of cervical region: Secondary | ICD-10-CM | POA: Diagnosis not present

## 2020-09-27 DIAGNOSIS — M9902 Segmental and somatic dysfunction of thoracic region: Secondary | ICD-10-CM | POA: Diagnosis not present

## 2020-09-28 DIAGNOSIS — Z01419 Encounter for gynecological examination (general) (routine) without abnormal findings: Secondary | ICD-10-CM | POA: Diagnosis not present

## 2020-09-28 DIAGNOSIS — Z13820 Encounter for screening for osteoporosis: Secondary | ICD-10-CM | POA: Diagnosis not present

## 2020-09-28 DIAGNOSIS — Z6825 Body mass index (BMI) 25.0-25.9, adult: Secondary | ICD-10-CM | POA: Diagnosis not present

## 2020-10-07 ENCOUNTER — Other Ambulatory Visit: Payer: Self-pay | Admitting: *Deleted

## 2020-10-07 DIAGNOSIS — C50411 Malignant neoplasm of upper-outer quadrant of right female breast: Secondary | ICD-10-CM

## 2020-10-09 NOTE — Progress Notes (Signed)
[place]  [phone] [phone]     ID: Jennifer Beck DOB: 28-May-1966  MR#: [id number]  QPR#:[id number]  Patient Care Team: Doree Albee, MD as PCP - General (Internal Medicine) [name], MD as Consulting Physician (Dermatology) [name], DC as Referring Physician (Chiropractic Medicine) [name], MD as Consulting Physician (General Surgery) [name], MD as Attending Physician (Radiation Oncology) [name], MD as Consulting Physician (Obstetrics and Gynecology) Sherrice Creekmore, Virgie Dad, MD as Consulting Physician (Oncology) [name], MD as Consulting Physician (Plastic Surgery) Alda Berthold, DO as Consulting Physician (Neurology) Roseanne Kaufman, MD as Consulting Physician (Orthopedic Surgery) OTHER MD:  CHIEF COMPLAINT: Triple negative breast cancer (s/p bilateral mastectomies)  CURRENT TREATMENT: Observation    INTERVAL HISTORY: Jennifer Beck returns today for follow-up of her triple negative breast cancer. She continues under observation.   Since her last visit, she underwent pelvic ultrasound on 03/01/2020 showing: heterogeneous slightly thickened endometrial complex 7 mm thick, likely represents cystic hyperplasia though cannot exclude neoplasm in this setting.  She tells me she went ahead and was evaluated by Dr. Nori Riis with a biopsy which was benign.  I do not have that report.  Given decreased  libido and other concerns her estrogen was changed to oral and progesterone and testosterone were added March 2022 by Dr Anastasio Champion.  She tells me that she is tolerating this well and that she does feel it is working for her.  She is also followed by Dr. Iran Planas who is planning to do some imaging of her breasts this year.   REVIEW OF SYSTEMS:  Salena had a significant bronchitis and ear infections in February after a camping trip.  This was treated with antibiotics.  She then had COVID in April.  This  really was more low back pain and mild fever than any other symptoms.  She is doing quite a bit of walking and had a normal bone density.  She is trying to change her diet because she says her cholesterol is not where she would wanted to be.  Otherwise a detailed review of systems today was stable  COVID 19 VACCINATION STATUS: Never vaccinated; had COVID April 2022   HISTORY OF CURRENT ILLNESS:  From the original intake note:  Jennifer Beck palpated a mass in her right breast sometime in September 2018.Jennifer Beck She brought this to medical attention and her PCP, [name], set her up on 03/18/2017 for a unilateral right diagnostic mammography with ultrasonography, showing: Breast density D. The palpable right breast mass at 12:30 was indeterminate, and biopsy was warranted at that time. No evidence of right axillary lymphadenopathy was noted. Biopsy of the lesion in question on 03/21/2017 at the right breast 12:30 position showed ([id number])  invasive ductal carcinoma with lymphovascular invasion.  The tumor was grade 3, triple negative, with an MIB-1 of 80%.  She is accompanied by her husband, Jennifer Beck to the office today. She reports that she is doing well overall. She initially felt a lump in September in the shower and notes that she doesn't complete monthly self breast exams. She had a routine mammogram in March 2018 at [facility] and she is followed by Dr. [name].  As far as surgeries, she has had two laparoscopic procedures for endometriosis as well as a bilateral tubal ligation. She still has occasional cramping, painful ovulation, and vaginal spotting that comes and goes. She has had cyst to her breast before that have been evaluated and ruled as  negative. She notes that she still has her tonsils, gallbladder, and appendix. She has a prior hx of pericarditis in 2003 that she notes was brought onby a virus. She has since been cleared by her prior Cardiologist.   She has a Restaurant manager, fast food,  [name] that she sees every 5 weeks for maintenance of her neck and back issues. She has a dermatologist, [name] at [facility] and she has had an biopsy of an area from her left chest that resulted as basal cell carcinoma in 2015. She denies having a GI specialist, Cardiologist, or Orthopedist at this time. She denies prior history of seizures, migraines, acid reflux, asthma, emphysema, palpitations or GI issues.   [name] is as detailed below.   PAST MEDICAL HISTORY: Past Medical History:  Diagnosis Date  . Arthritis    leg and muscle pains  . Chest pain   . Complication of anesthesia   . Endometriosis   . Family history of breast cancer 04/22/2017  . Family history of colon cancer 04/22/2017  . Family history of kidney cancer 04/22/2017  . Fatigue   . GERD (gastroesophageal reflux disease)   . GI bleed    caused by ischemic colitis following an episode of hypertension  . Heart palpitations   . Hx of echocardiogram    a. echo 9/13:  EF 55-60%, Gr 2 diast dysfn  . Hypercholesterolemia   . Ischemic colitis ([place])   . Myocarditis ([place])   . Pericarditis   . PONV (postoperative nausea and vomiting)   . Restrictive pericarditis 2004  . SOB (shortness of breath)     PAST SURGICAL HISTORY: Past Surgical History:  Procedure Laterality Date  . BREAST RECONSTRUCTION WITH PLACEMENT OF TISSUE EXPANDER AND ALLODERM Bilateral 10/08/2017   Procedure: BREAST RECONSTRUCTION WITH PLACEMENT OF TISSUE EXPANDER AND ALLODERM;  Surgeon: [name], MD;  Location: [place];  Service: Plastics;  Laterality: Bilateral;  . CARDIAC CATHETERIZATION  08/26/2001   EF of 60% --- smooth & normal coronary arteries -- there is a Burggraf motion defect consistent with posterolateral MI -- she quite possibly had a coronary spasm -- she may have some pericarditis secondary to her MI   . DILATION AND CURETTAGE OF UTERUS  10/15/2003   Uterine  enlargement, menorrhagia  . DILATION AND CURETTAGE OF UTERUS  05/27/2002   Dysfunctional uterine bleeding  . LAPAROSCOPY    . LIPOSUCTION WITH LIPOFILLING Bilateral 01/17/2018   Procedure: LIPOFILLING FROM ABDOMEN TO CHEST;  Surgeon: [name], MD;  Location: [place];  Service: Plastics;  Laterality: Bilateral;  . MASTECTOMY W/ SENTINEL NODE BIOPSY Bilateral 10/08/2017   Procedure: RIGHT MASTECTOMY WITH RIGHT AXILLARY SENTINEL LYMPH NODE BIOPSY AND LEFT PROPHYLACTIC MASTECTOMY;  Surgeon: [name], MD;  Location: [place];  Service: General;  Laterality: Bilateral;  . NOVASURE ABLATION  10/15/2003   for management of her extreme menorrhagi  . PORTACATH PLACEMENT Right 04/02/2017   Procedure: INSERTION PORT-A-CATH;  Surgeon: [name], MD;  Location: [facility];  Service: General;  Laterality: Right;  . REMOVAL OF BILATERAL TISSUE EXPANDERS WITH PLACEMENT OF BILATERAL BREAST IMPLANTS Bilateral 01/17/2018   Procedure: REMOVAL OF BILATERAL TISSUE EXPANDERS WITH PLACEMENT OF BILATERAL SILICONE BREAST IMPLANTS;  Surgeon: [name], MD;  Location: [place];  Service: Plastics;  Laterality: Bilateral;  . TUBAL LIGATION  09/2000   bilateral    FAMILY HISTORY Family History  Problem Relation Age of Onset  . Hypertension  Father   . Diabetes Father   . Other Father        stomach mass suspicious for ca, never bx  . Coronary artery disease Mother   . Kidney cancer Mother 35  . Coronary artery disease Maternal Grandfather   . Colon cancer Maternal Grandfather 61       recurrence  . Coronary artery disease Maternal Uncle   . Coronary artery disease Maternal Uncle   . Diabetes Maternal Uncle   . Colon cancer Maternal Grandmother 76  . Breast cancer Cousin 75       had GT- CHEK2+  . Cervical cancer Maternal Aunt 35  . Diabetes Maternal Aunt   . Alzheimer's disease Paternal Grandmother   . Colon cancer Paternal Grandfather 25   . Breast cancer Paternal Aunt   . Breast cancer Paternal Aunt   . Breast cancer Other 92  Her father died 02/28/16 at age 9 just 31 week shy of his 83rd birthday. Her mother is 40 years old as of October 2018. Patient has one brother and no sisters. Her mother was diagnosed with kidney cancer at age 65 with a nephrectomy following. Her maternal grandmother was diagnosed with colon cancer at age 2.  The patient paternal grandfather was diagnosed with colon cancer in his 68's. She has paternal cousins through her fathers half-sisters that have been diagnosed with breast cancer, she is unsure of the number of breast cancer occurrences due to the distance . A maternal cousin was diagnosed with breast cancer at age 6 and she had genetic testing. The patient does have a copy of this testing as well as her PCP.  This was not available for review on the nasal   GYNECOLOGIC HISTORY:  No LMP recorded. Patient is postmenopausal.  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 Her husband Jennifer Beck is a Airline pilot. Her daughter [name] graduated from [employer] and currently works full-time at the [employer].  She commutes from home.  The patient attends [employer]. She lives in [place].     ADVANCED DIRECTIVES: In the absence of any documentation to the contrary, the patient's spouse is their HCPOA.    HEALTH MAINTENANCE: Social History   Tobacco Use  . Smoking status: Never Smoker  . Smokeless tobacco: Never Used  Vaping Use  . Vaping Use: Never used  Substance Use Topics  . Alcohol use: Yes    Comment: Occasionally drinks wine  . Drug use: No     Colonoscopy: December 2019  (Outlaw)  PAP: March 2018   Bone density: N/A   Allergies  Allergen Reactions  . Codeine Itching  . Biaxin [Clarithromycin]     Heart Burn    Current Outpatient Medications  Medication Sig Dispense Refill  . albuterol (VENTOLIN HFA) 108 (90 Base) MCG/ACT inhaler 1 puff as needed (Patient not taking: Reported on 08/24/2020)    . ALPRAZolam (XANAX) 0.25 MG tablet Take 0.25 mg by mouth daily at 10 pm. Only at bedtime as need    . Carboxymeth-Glyc-Polysorb PF (REFRESH OPTIVE MEGA-3) 0.5-1-0.5 % SOLN     . Cholecalciferol (VITAMIN D) 2000 units CAPS Take 2,000 Units  by mouth daily.    Jennifer Beck estradiol (ESTRACE) 1 MG tablet Take 1 tablet (1 mg total) by mouth daily. 30 tablet 3  . famotidine (PEPCID) 20 MG tablet 1 tablet    . Multiple Minerals-Vitamins (CALCIUM-MAGNESIUM-ZINC) TABS Take 1 tablet by mouth daily.     . Multiple Vitamins-Minerals (MULTIVITAMINS THER. W/MINERALS) TABS Take 1 tablet by mouth daily.     . Omega-3 Fatty Acids (FISH OIL) 1200 MG CAPS 1 capsule    . Probiotic Product (PROBIOTIC BLEND PO) 1 capsule    . progesterone (PROMETRIUM) 200 MG capsule Take 1 capsule (200 mg total) by mouth daily. 30 capsule 3  . Testosterone 1.62 % GEL Place 5 mg onto the skin daily. 30 g 1   No current facility-administered medications for this visit.    OBJECTIVE: white woman who appears well Vitals:   10/10/20 0849  BP: 119/69  Pulse: 69  Resp: 18  Temp: 97.7 F (36.5 C)  SpO2: 100%     Body mass index is 25.86 kg/m.   Wt Readings from Last 3 Encounters:  10/10/20 155 lb 6.4 oz (70.5 kg)  08/24/20 157 lb (71.2 kg)  08/03/20 165 lb 6.4 oz (75 kg)   ECOG FS:1  Sclerae unicteric, EOMs intact Wearing a mask No cervical or supraclavicular adenopathy Lungs no rales or rhonchi Heart regular rate and rhythm Abd soft, nontender, positive bowel sounds MSK no focal spinal tenderness, no upper extremity lymphedema Neuro: nonfocal, well oriented, appropriate affect Breasts: Status  post bilateral mastectomies with bilateral silicone implant reconstruction.  There is no evidence of chest Trulock recurrence.  Both axillae are benign.   LAB RESULTS:  CMP     Component Value Date/Time   NA 140 08/03/2020 0000   NA 137 05/30/2017 1219   K 4.7 08/03/2020 0000   K 3.8 05/30/2017 1219   CL 103 08/03/2020 0000   CO2 26 08/03/2020 0000   CO2 25 05/30/2017 1219   GLUCOSE 85 08/03/2020 0000   GLUCOSE 122 05/30/2017 1219   BUN 13 08/03/2020 0000   BUN 8.9 05/30/2017 1219   CREATININE 0.62 08/03/2020 0000   CREATININE 0.7 05/30/2017 1219   CALCIUM 9.9 08/03/2020 0000   CALCIUM 9.4 05/30/2017 1219   PROT 7.2 08/03/2020 0000   PROT 6.4 05/30/2017 1219   ALBUMIN 4.2 10/08/2019 1246   ALBUMIN 3.7 05/30/2017 1219   AST 17 08/03/2020 0000   AST 12 05/30/2017 1219   ALT 14 08/03/2020 0000   ALT 14 05/30/2017 1219   ALKPHOS 103 10/08/2019 1246   ALKPHOS 137 05/30/2017 1219   BILITOT 0.2 08/03/2020 0000   BILITOT 0.38 05/30/2017 1219   GFRNONAA 104 08/03/2020 0000   GFRAA 120 08/03/2020 0000    Lab Results  Component Value Date   WBC 6.1 10/10/2020   NEUTROABS 3.5 10/10/2020   HGB 13.6 10/10/2020   HCT 39.6 10/10/2020   MCV 95.0 10/10/2020   PLT 285 10/10/2020   No results found for: LABCA2  No components found for: [id number]  No results for input(s): INR in the last 168 hours.  No results found for: LABCA2  No results found for: FAO130  No results found for: QMV784  No results found for: ONG295  No results found for: CA2729  No components found for: HGQUANT  No results found for: CEA1 / No results found for: CEA1   No results found for: AFPTUMOR  No results found for: [place]  No results found for: TOTALPROTELP, ALBUMINELP, A1GS,  A2GS, BETS, BETA2SER, GAMS, MSPIKE, SPEI (this displays SPEP labs)  No results found for: KPAFRELGTCHN, LAMBDASER, KAPLAMBRATIO (kappa/lambda light chains)  No results found for: HGBA, HGBA2QUANT, HGBFQUANT,  HGBSQUAN (Hemoglobinopathy evaluation)   No results found for: LDH  No results found for: IRON, TIBC, IRONPCTSAT (Iron and TIBC)  No results found for: FERRITIN  Urinalysis No results found for: COLORURINE, APPEARANCEUR, LABSPEC, PHURINE, GLUCOSEU, HGBUR, BILIRUBINUR, KETONESUR, PROTEINUR, UROBILINOGEN, NITRITE, LEUKOCYTESUR   STUDIES: No results found.   ELIGIBLE FOR AVAILABLE RESEARCH PROTOCOL: Enrolled in cancer disparities study   ASSESSMENT: 55 y.o. [place] woman status post right breast upper inner quadrant biopsy March 21, 2017 for a clinical T2 N0  Stage IIB invasive ductal carcinoma, grade 3, triple negative, with an MIB-1 of 80%.  (a) biopsy of 2 additional suspicious areas in the right breast and one in the left breast 04/11/2017 showed atypical lobular hyperplasia and sclerosing adenosis but no evidence of cancer  (1) neoadjuvant chemotherapy consisting of doxorubicin and cyclophosphamide in dose dense fashion x4 completed 05/23/2017, followed by paclitaxel/carboplatin weekly x12, first dose 06/06/2017, completed 08/29/2017  (2) status post bilateral mastectomies 10/08/2017, showing  (a) left breast, atypical lobular hyperplasia, no evidence of malignancy  (b) right breast, residual  pT1b pN0 invasive ductal carcinoma, grade 3, with negative margins.  A total of 2 sentinel lymph nodes were removed; repeat prognostic panel requested 10/18/2017  (c) immediate expander reconstruction with silicone implants on 16/02/9603  (3) adjuvant radiation not indicated  (4) genetics testing 04/22/2017 through the common hereditary cancer panel plus renal/urinary tract cancel panel showed no deleterious mutations in APC, ATM, AXIN2, BAP1, BARD1, BMPR1A, BRCA1, BRCA2, BRIP1, BUB1B, CDC73, CDH1, CDK4, CDKN1C, CDKN2A (p14ARF), CDKN2A (p16INK4a), CEP57, CHEK2, CTNNA1, DICER1, DIS3L2, EPCAM*, FH, FLCN, GPC3, GREM1*, KIT, MEN1, MET, MLH1,MSH2, MSH3, MSH6, MUTYH, NBN, NF1, PALB2, PDGFRA,  PMS2, POLD1, POLE, PTEN, RAD50, RAD51C, RAD51D, SDHB,SDHC, SDHD, SMAD4, SMARCA4, SMARCB1, STK11, TP53, TSC1, TSC2, VHL, WT1.The following genes were evaluated for sequence changes only: HOXB13*, MITF*, NTHL1*, SDHA  (a) there was a Variant of Uncertain Significance identified in POLD1, namely c.2953C>T (p.Arg985Trp)  (5) declined participation in S 1418 due to concerns regarding side effects   PLAN: Yaslin is now 3 years out from definitive surgery for her breast cancer with no evidence of disease recurrence.  This is favorable.  She has follow-up through Dr. Iran Planas who will be imaging her implants sometime this year.  She also follows with Dr.Neil.  She is undergoing hormonal treatment under Dr. Anastasio Champion.  I am going to set her up for a repeat ultrasound of the uterus this October but assuming no change she will not need any further uterine image from that point  She will see me virtually shortly after that  She knows to call for any other issue that may develop before then  Total encounter time 25 minutes.*    Azeem Poorman, Virgie Dad, MD  10/10/20 9:02 AM Medical Oncology and Hematology [facility] [place],  [address] Tel. [phone]    Fax. [phone]   I, Wilburn Mylar, am acting as scribe for Dr. Virgie Dad. Shenelle Klas.  I, Lurline Del MD, have reviewed the above documentation for accuracy and completeness, and I agree with the above.   *Total Encounter Time as defined by the Centers for Medicare and Medicaid Services includes, in addition to the face-to-face time of a patient visit (documented in the note above) non-face-to-face time: obtaining and reviewing outside history, ordering and reviewing  medications, tests or procedures, care coordination (communications with other health care professionals or caregivers) and documentation in the medical record.  

## 2020-10-10 ENCOUNTER — Inpatient Hospital Stay: Payer: BC Managed Care – PPO | Attending: Oncology | Admitting: Oncology

## 2020-10-10 ENCOUNTER — Other Ambulatory Visit: Payer: Self-pay

## 2020-10-10 ENCOUNTER — Inpatient Hospital Stay: Payer: BC Managed Care – PPO

## 2020-10-10 VITALS — BP 119/69 | HR 69 | Temp 97.7°F | Resp 18 | Ht 65.0 in | Wt 155.4 lb

## 2020-10-10 DIAGNOSIS — Z171 Estrogen receptor negative status [ER-]: Secondary | ICD-10-CM

## 2020-10-10 DIAGNOSIS — Z8051 Family history of malignant neoplasm of kidney: Secondary | ICD-10-CM | POA: Diagnosis not present

## 2020-10-10 DIAGNOSIS — Z79899 Other long term (current) drug therapy: Secondary | ICD-10-CM | POA: Insufficient documentation

## 2020-10-10 DIAGNOSIS — C50411 Malignant neoplasm of upper-outer quadrant of right female breast: Secondary | ICD-10-CM

## 2020-10-10 DIAGNOSIS — Z803 Family history of malignant neoplasm of breast: Secondary | ICD-10-CM | POA: Diagnosis not present

## 2020-10-10 DIAGNOSIS — Z833 Family history of diabetes mellitus: Secondary | ICD-10-CM | POA: Diagnosis not present

## 2020-10-10 DIAGNOSIS — Z9013 Acquired absence of bilateral breasts and nipples: Secondary | ICD-10-CM | POA: Insufficient documentation

## 2020-10-10 DIAGNOSIS — Z8049 Family history of malignant neoplasm of other genital organs: Secondary | ICD-10-CM | POA: Insufficient documentation

## 2020-10-10 DIAGNOSIS — Z8249 Family history of ischemic heart disease and other diseases of the circulatory system: Secondary | ICD-10-CM | POA: Diagnosis not present

## 2020-10-10 DIAGNOSIS — C50211 Malignant neoplasm of upper-inner quadrant of right female breast: Secondary | ICD-10-CM | POA: Diagnosis not present

## 2020-10-10 DIAGNOSIS — Z818 Family history of other mental and behavioral disorders: Secondary | ICD-10-CM | POA: Insufficient documentation

## 2020-10-10 DIAGNOSIS — Z800 Family history of malignant neoplasm of digestive organs: Secondary | ICD-10-CM | POA: Diagnosis not present

## 2020-10-10 DIAGNOSIS — R252 Cramp and spasm: Secondary | ICD-10-CM | POA: Diagnosis not present

## 2020-10-10 DIAGNOSIS — N6092 Unspecified benign mammary dysplasia of left breast: Secondary | ICD-10-CM | POA: Diagnosis not present

## 2020-10-10 DIAGNOSIS — Z85828 Personal history of other malignant neoplasm of skin: Secondary | ICD-10-CM | POA: Diagnosis not present

## 2020-10-10 DIAGNOSIS — Z885 Allergy status to narcotic agent status: Secondary | ICD-10-CM | POA: Diagnosis not present

## 2020-10-10 LAB — CMP (CANCER CENTER ONLY)
ALT: 14 U/L (ref 0–44)
AST: 19 U/L (ref 15–41)
Albumin: 4.1 g/dL (ref 3.5–5.0)
Alkaline Phosphatase: 59 U/L (ref 38–126)
Anion gap: 8 (ref 5–15)
BUN: 11 mg/dL (ref 6–20)
CO2: 26 mmol/L (ref 22–32)
Calcium: 9.3 mg/dL (ref 8.9–10.3)
Chloride: 106 mmol/L (ref 98–111)
Creatinine: 0.75 mg/dL (ref 0.44–1.00)
GFR, Estimated: >60 mL/min (ref >60)
Glucose, Bld: 93 mg/dL (ref 70–99)
Potassium: 4.4 mmol/L (ref 3.5–5.1)
Sodium: 140 mmol/L (ref 135–145)
Total Bilirubin: 0.4 mg/dL (ref 0.3–1.2)
Total Protein: 7.2 g/dL (ref 6.5–8.1)

## 2020-10-10 LAB — CBC WITH DIFFERENTIAL (CANCER CENTER ONLY)
Abs Immature Granulocytes: 0.02 10*3/uL (ref 0.00–0.07)
Basophils Absolute: 0 10*3/uL (ref 0.0–0.1)
Basophils Relative: 1 %
Eosinophils Absolute: 0.3 10*3/uL (ref 0.0–0.5)
Eosinophils Relative: 5 %
HCT: 39.6 % (ref 36.0–46.0)
Hemoglobin: 13.6 g/dL (ref 12.0–15.0)
Immature Granulocytes: 0 %
Lymphocytes Relative: 28 %
Lymphs Abs: 1.7 10*3/uL (ref 0.7–4.0)
MCH: 32.6 pg (ref 26.0–34.0)
MCHC: 34.3 g/dL (ref 30.0–36.0)
MCV: 95 fL (ref 80.0–100.0)
Monocytes Absolute: 0.6 10*3/uL (ref 0.1–1.0)
Monocytes Relative: 9 %
Neutro Abs: 3.5 10*3/uL (ref 1.7–7.7)
Neutrophils Relative %: 57 %
Platelet Count: 285 10*3/uL (ref 150–400)
RBC: 4.17 MIL/uL (ref 3.87–5.11)
RDW: 12.1 % (ref 11.5–15.5)
WBC Count: 6.1 10*3/uL (ref 4.0–10.5)
nRBC: 0 % (ref 0.0–0.2)

## 2020-10-20 ENCOUNTER — Other Ambulatory Visit: Payer: Self-pay

## 2020-10-20 ENCOUNTER — Ambulatory Visit (INDEPENDENT_AMBULATORY_CARE_PROVIDER_SITE_OTHER): Payer: BC Managed Care – PPO | Admitting: Internal Medicine

## 2020-10-20 ENCOUNTER — Encounter (INDEPENDENT_AMBULATORY_CARE_PROVIDER_SITE_OTHER): Payer: Self-pay | Admitting: Internal Medicine

## 2020-10-20 VITALS — BP 123/76 | HR 83 | Temp 97.3°F | Resp 18 | Ht 65.0 in | Wt 154.0 lb

## 2020-10-20 DIAGNOSIS — R5383 Other fatigue: Secondary | ICD-10-CM

## 2020-10-20 DIAGNOSIS — E2839 Other primary ovarian failure: Secondary | ICD-10-CM

## 2020-10-20 DIAGNOSIS — F520 Hypoactive sexual desire disorder: Secondary | ICD-10-CM

## 2020-10-20 DIAGNOSIS — R5381 Other malaise: Secondary | ICD-10-CM | POA: Diagnosis not present

## 2020-10-20 NOTE — Progress Notes (Signed)
Metrics: Intervention Frequency ACO  Documented Smoking Status Yearly  Screened one or more times in 24 months  Cessation Counseling or  Active cessation medication Past 24 months  Past 24 months   Guideline developer: UpToDate (See UpToDate for funding source) Date Released: 2014       Wellness Office Visit  Subjective:  Patient ID: Jennifer Beck, female    DOB: 1965-08-19  Age: 55 y.o. MRN: [id number]  CC: This lady comes in for follow-up of bioidentical hormone therapy. HPI  She feels somewhat improved with estradiol and especially progesterone which seems to help her sleep better.  Testosterone therapy has improved her libido and energy but she was wishing for more improvement at the present time.  She takes testosterone cream apply to the labia at night and it is somewhat liquidy and she does not feel it gets absorbed completely. She has been doing some intermittent fasting but has not been exercising on a regular basis. Past Medical History:  Diagnosis Date  . Arthritis    leg and muscle pains  . Chest pain   . Complication of anesthesia   . Endometriosis   . Family history of breast cancer 04/22/2017  . Family history of colon cancer 04/22/2017  . Family history of kidney cancer 04/22/2017  . Fatigue   . GERD (gastroesophageal reflux disease)   . GI bleed    caused by ischemic colitis following an episode of hypertension  . Heart palpitations   . Hx of echocardiogram    a. echo 9/13:  EF 55-60%, Gr 2 diast dysfn  . Hypercholesterolemia   . Ischemic colitis ([place])   . Myocarditis ([place])   . Pericarditis   . PONV (postoperative nausea and vomiting)   . Restrictive pericarditis 2004  . SOB (shortness of breath)    Past Surgical History:  Procedure Laterality Date  . BREAST RECONSTRUCTION WITH PLACEMENT OF TISSUE EXPANDER AND ALLODERM Bilateral 10/08/2017   Procedure: BREAST RECONSTRUCTION WITH PLACEMENT OF TISSUE EXPANDER AND ALLODERM;  Surgeon: [name],  MD;  Location: [facility];  Service: Plastics;  Laterality: Bilateral;  . CARDIAC CATHETERIZATION  08/26/2001   EF of 60% --- smooth & normal coronary arteries -- there is a Weigold motion defect consistent with posterolateral MI -- she quite possibly had a coronary spasm -- she may have some pericarditis secondary to her MI   . DILATION AND CURETTAGE OF UTERUS  10/15/2003   Uterine enlargement, menorrhagia  . DILATION AND CURETTAGE OF UTERUS  05/27/2002   Dysfunctional uterine bleeding  . LAPAROSCOPY    . LIPOSUCTION WITH LIPOFILLING Bilateral 01/17/2018   Procedure: LIPOFILLING FROM ABDOMEN TO CHEST;  Surgeon: [name], MD;  Location: [facility];  Service: Plastics;  Laterality: Bilateral;  . MASTECTOMY W/ SENTINEL NODE BIOPSY Bilateral 10/08/2017   Procedure: RIGHT MASTECTOMY WITH RIGHT AXILLARY SENTINEL LYMPH NODE BIOPSY AND LEFT PROPHYLACTIC MASTECTOMY;  Surgeon: [name], MD;  Location: [facility];  Service: General;  Laterality: Bilateral;  . NOVASURE ABLATION  10/15/2003   for management of her extreme menorrhagi  . PORTACATH PLACEMENT Right 04/02/2017   Procedure: INSERTION PORT-A-CATH;  Surgeon: [name], MD;  Location: [facility];  Service: General;  Laterality: Right;  . REMOVAL OF BILATERAL TISSUE EXPANDERS WITH PLACEMENT OF BILATERAL BREAST IMPLANTS Bilateral 01/17/2018   Procedure: REMOVAL OF BILATERAL TISSUE EXPANDERS WITH PLACEMENT OF BILATERAL SILICONE BREAST IMPLANTS;  Surgeon: [name], MD;  Location: [facility];  Service: Plastics;  Laterality: Bilateral;  . TUBAL LIGATION  09/2000   bilateral     Family History  Problem Relation Age of Onset  . Hypertension Father   . Diabetes Father   . Other Father        stomach mass suspicious for ca, never bx  . Coronary artery disease Mother   . Kidney cancer Mother 63  . Coronary artery disease Maternal Grandfather   . Colon cancer Maternal  Grandfather 61       recurrence  . Coronary artery disease Maternal Uncle   . Coronary artery disease Maternal Uncle   . Diabetes Maternal Uncle   . Colon cancer Maternal Grandmother 75  . Breast cancer Cousin 29       had GT- CHEK2+  . Cervical cancer Maternal Aunt 35  . Diabetes Maternal Aunt   . Alzheimer's disease Paternal Grandmother   . Colon cancer Paternal Grandfather 66  . Breast cancer Paternal Aunt   . Breast cancer Paternal Aunt   . Breast cancer Other 55    Social History   Social History Narrative   She works as Scientist, product/process development.   She lives at home with husband. Married since 1998. They have 1 daughter   Highest level of education: associates   Social History   Tobacco Use  . Smoking status: Never Smoker  . Smokeless tobacco: Never Used  Substance Use Topics  . Alcohol use: Yes    Comment: Occasionally drinks wine    Current Meds  Medication Sig  . albuterol (VENTOLIN HFA) 108 (90 Base) MCG/ACT inhaler   . ALPRAZolam (XANAX) 0.25 MG tablet Take 0.25 mg by mouth daily at 10 pm. Only at bedtime as need  . Carboxymeth-Glyc-Polysorb PF (REFRESH OPTIVE MEGA-3) 0.5-1-0.5 % SOLN   . Cholecalciferol (VITAMIN D) 2000 units CAPS Take 2,000 Units by mouth daily.  [name] estradiol (ESTRACE) 1 MG tablet Take 1 tablet (1 mg total) by mouth daily.  . famotidine (PEPCID) 20 MG tablet 1 tablet  . Multiple Minerals-Vitamins (CALCIUM-MAGNESIUM-ZINC) TABS Take 1 tablet by mouth daily.   . Multiple Vitamins-Minerals (MULTIVITAMINS THER. W/MINERALS) TABS Take 1 tablet by mouth daily.   . Omega-3 Fatty Acids (FISH OIL) 1200 MG CAPS 1 capsule  . Probiotic Product (PROBIOTIC BLEND PO) 1 capsule  . progesterone (PROMETRIUM) 200 MG capsule Take 1 capsule (200 mg total) by mouth daily.  . Testosterone 1.62 % GEL Place 5 mg onto the skin daily.       Objective:   Today's Vitals: BP 123/76 (BP Location: Left Arm, Patient Position: Sitting, Cuff Size: Normal)   Pulse 83   Temp (!)  97.3 F (36.3 C) (Temporal)   Resp 18   Ht '5\' 5"'  (1.651 m)   Wt 154 lb (69.9 kg)   SpO2 98%   BMI 25.63 kg/m  Vitals with BMI 10/20/2020 10/10/2020 08/24/2020  Height '5\' 5"'  '5\' 5"'  '5\' 5"'   Weight 154 lbs 155 lbs 6 oz 157 lbs  BMI 25.63 19.50 93.26  Systolic 712 458 099  Diastolic 76 69 64  Pulse 83 69 93     Physical Exam  She looks systemically well.  She has lost 3 pounds since last time I saw her.  Blood pressure is excellent.     Assessment   1. Primary ovarian failure   2. Hypoactive sexual desire disorder   3. Malaise and fatigue       Tests ordered Orders Placed This Encounter  Procedures  . Estradiol  .  Progesterone  . Testos,Total,Free and SHBG (Female)     Plan: 1. Continue with estradiol 1 mg daily and progesterone 200 mg at night and we will check levels today.  We may need to adjust these. 2. Continue with testosterone cream and she will now apply it into the vagina to see if this will work better for her. 3. We discussed the importance of exercise with a combination of cardiovascular training and strength training.  She will start to begin doing something at low impact. 4. I will see her in 3 months for follow-up and further recommendations will depend on blood results.  She wanted me to know that she did get COVID-19 disease around September 04, 2020.  She made a full recovery.   No orders of the defined types were placed in this encounter.   Doree Albee, MD

## 2020-10-20 NOTE — Progress Notes (Signed)
Pt has copies of OBGYN testing to update in her chart.

## 2020-10-26 DIAGNOSIS — M9902 Segmental and somatic dysfunction of thoracic region: Secondary | ICD-10-CM | POA: Diagnosis not present

## 2020-10-26 DIAGNOSIS — M542 Cervicalgia: Secondary | ICD-10-CM | POA: Diagnosis not present

## 2020-10-26 DIAGNOSIS — M9901 Segmental and somatic dysfunction of cervical region: Secondary | ICD-10-CM | POA: Diagnosis not present

## 2020-10-26 DIAGNOSIS — M9904 Segmental and somatic dysfunction of sacral region: Secondary | ICD-10-CM | POA: Diagnosis not present

## 2020-10-26 LAB — TESTOS,TOTAL,FREE AND SHBG (FEMALE)
Free Testosterone: 4.1 pg/mL (ref 0.1–6.4)
Sex Hormone Binding: 55 nmol/L (ref 17–124)
Testosterone, Total, LC-MS-MS: 67 ng/dL — ABNORMAL HIGH (ref 2–45)

## 2020-10-26 LAB — ESTRADIOL: Estradiol: 34 pg/mL

## 2020-10-26 LAB — PROGESTERONE: Progesterone: 8 ng/mL

## 2020-10-27 ENCOUNTER — Encounter (INDEPENDENT_AMBULATORY_CARE_PROVIDER_SITE_OTHER): Payer: Self-pay | Admitting: Internal Medicine

## 2020-10-27 ENCOUNTER — Other Ambulatory Visit (INDEPENDENT_AMBULATORY_CARE_PROVIDER_SITE_OTHER): Payer: Self-pay | Admitting: Internal Medicine

## 2020-10-27 MED ORDER — PROGESTERONE 200 MG PO CAPS: 400.0000 mg | ORAL_CAPSULE | Freq: Every day | ORAL | 3 refills | Status: DC

## 2020-10-27 MED ORDER — ESTRADIOL 1 MG PO TABS: 1.5000 mg | ORAL_TABLET | Freq: Every day | ORAL | 3 refills | Status: AC

## 2020-10-31 ENCOUNTER — Other Ambulatory Visit (INDEPENDENT_AMBULATORY_CARE_PROVIDER_SITE_OTHER): Payer: Self-pay | Admitting: Internal Medicine

## 2020-10-31 MED ORDER — PROGESTERONE MICRONIZED 100 MG PO CAPS
100.0000 mg | ORAL_CAPSULE | Freq: Every day | ORAL | 3 refills | Status: DC
Start: 2020-10-31 — End: 2021-09-26

## 2020-11-03 ENCOUNTER — Other Ambulatory Visit (INDEPENDENT_AMBULATORY_CARE_PROVIDER_SITE_OTHER): Payer: Self-pay | Admitting: Internal Medicine

## 2020-11-03 MED ORDER — TESTOSTERONE 1.62 % TD GEL: 5.0000 mg | Freq: Two times a day (BID) | TRANSDERMAL | 3 refills | Status: AC

## 2020-11-22 DIAGNOSIS — H0288B Meibomian gland dysfunction left eye, upper and lower eyelids: Secondary | ICD-10-CM | POA: Diagnosis not present

## 2020-11-22 DIAGNOSIS — H16141 Punctate keratitis, right eye: Secondary | ICD-10-CM | POA: Diagnosis not present

## 2020-11-22 DIAGNOSIS — H0288A Meibomian gland dysfunction right eye, upper and lower eyelids: Secondary | ICD-10-CM | POA: Diagnosis not present

## 2020-11-22 DIAGNOSIS — H16223 Keratoconjunctivitis sicca, not specified as Sjogren's, bilateral: Secondary | ICD-10-CM | POA: Diagnosis not present

## 2020-11-23 DIAGNOSIS — M9904 Segmental and somatic dysfunction of sacral region: Secondary | ICD-10-CM | POA: Diagnosis not present

## 2020-11-23 DIAGNOSIS — M9902 Segmental and somatic dysfunction of thoracic region: Secondary | ICD-10-CM | POA: Diagnosis not present

## 2020-11-23 DIAGNOSIS — M9901 Segmental and somatic dysfunction of cervical region: Secondary | ICD-10-CM | POA: Diagnosis not present

## 2020-11-23 DIAGNOSIS — M542 Cervicalgia: Secondary | ICD-10-CM | POA: Diagnosis not present

## 2020-11-24 ENCOUNTER — Encounter (INDEPENDENT_AMBULATORY_CARE_PROVIDER_SITE_OTHER): Payer: Self-pay | Admitting: Internal Medicine

## 2020-12-12 ENCOUNTER — Encounter (INDEPENDENT_AMBULATORY_CARE_PROVIDER_SITE_OTHER): Payer: Self-pay | Admitting: Internal Medicine

## 2020-12-20 ENCOUNTER — Ambulatory Visit (INDEPENDENT_AMBULATORY_CARE_PROVIDER_SITE_OTHER): Payer: BC Managed Care – PPO | Admitting: Internal Medicine

## 2020-12-20 ENCOUNTER — Encounter (INDEPENDENT_AMBULATORY_CARE_PROVIDER_SITE_OTHER): Payer: Self-pay | Admitting: Internal Medicine

## 2020-12-20 ENCOUNTER — Other Ambulatory Visit: Payer: Self-pay

## 2020-12-20 ENCOUNTER — Other Ambulatory Visit (INDEPENDENT_AMBULATORY_CARE_PROVIDER_SITE_OTHER): Payer: Self-pay | Admitting: Internal Medicine

## 2020-12-20 VITALS — BP 122/71 | HR 77 | Temp 97.9°F | Resp 18 | Ht 65.0 in | Wt 157.0 lb

## 2020-12-20 DIAGNOSIS — R5381 Other malaise: Secondary | ICD-10-CM | POA: Diagnosis not present

## 2020-12-20 DIAGNOSIS — R5383 Other fatigue: Secondary | ICD-10-CM | POA: Diagnosis not present

## 2020-12-20 DIAGNOSIS — F520 Hypoactive sexual desire disorder: Secondary | ICD-10-CM | POA: Diagnosis not present

## 2020-12-20 DIAGNOSIS — E2839 Other primary ovarian failure: Secondary | ICD-10-CM | POA: Diagnosis not present

## 2020-12-20 NOTE — Progress Notes (Signed)
Up 3 lbs ; feels bloated.

## 2020-12-20 NOTE — Progress Notes (Signed)
Metrics: Intervention Frequency ACO  Documented Smoking Status Yearly  Screened one or more times in 24 months  Cessation Counseling or  Active cessation medication Past 24 months  Past 24 months   Guideline developer: UpToDate (See UpToDate for funding source) Date Released: 2014       Wellness Office Visit  Subjective:  Patient ID: Jennifer Beck, female    DOB: 12-Aug-1965  Age: 55 y.o. MRN: [id number]  CC: This lady comes in for follow-up of B HRT. HPI  She feels bloated in the pelvic area.  She feels that the testosterone has not helped her in terms of libido and energy.  She is sleeping much better with a higher progesterone dose of 300 mg at night.  She is taking estradiol 1.5 mg in the morning. Past Medical History:  Diagnosis Date   Arthritis    leg and muscle pains   Chest pain    Complication of anesthesia    Endometriosis    Family history of breast cancer 04/22/2017   Family history of colon cancer 04/22/2017   Family history of kidney cancer 04/22/2017   Fatigue    GERD (gastroesophageal reflux disease)    GI bleed    caused by ischemic colitis following an episode of hypertension   Heart palpitations    Hx of echocardiogram    a. echo 9/13:  EF 55-60%, Gr 2 diast dysfn   Hypercholesterolemia    Ischemic colitis ([place])    Myocarditis ([place])    Pericarditis    PONV (postoperative nausea and vomiting)    Restrictive pericarditis 2004   SOB (shortness of breath)    Past Surgical History:  Procedure Laterality Date   BREAST RECONSTRUCTION WITH PLACEMENT OF TISSUE EXPANDER AND ALLODERM Bilateral 10/08/2017   Procedure: BREAST RECONSTRUCTION WITH PLACEMENT OF TISSUE EXPANDER AND ALLODERM;  Surgeon: [name], MD;  Location: [facility];  Service: Plastics;  Laterality: Bilateral;   CARDIAC CATHETERIZATION  08/26/2001   EF of 60% --- smooth & normal coronary arteries -- there is a Poplin motion defect consistent with posterolateral MI -- she  quite possibly had a coronary spasm -- she may have some pericarditis secondary to her MI    DILATION AND CURETTAGE OF UTERUS  10/15/2003   Uterine enlargement, menorrhagia   DILATION AND CURETTAGE OF UTERUS  05/27/2002   Dysfunctional uterine bleeding   LAPAROSCOPY     LIPOSUCTION WITH LIPOFILLING Bilateral 01/17/2018   Procedure: LIPOFILLING FROM ABDOMEN TO CHEST;  Surgeon: [name], MD;  Location: [facility];  Service: Plastics;  Laterality: Bilateral;   MASTECTOMY W/ SENTINEL NODE BIOPSY Bilateral 10/08/2017   Procedure: RIGHT MASTECTOMY WITH RIGHT AXILLARY SENTINEL LYMPH NODE BIOPSY AND LEFT PROPHYLACTIC MASTECTOMY;  Surgeon: [name], MD;  Location: [facility];  Service: General;  Laterality: Bilateral;   NOVASURE ABLATION  10/15/2003   for management of her extreme menorrhagi   PORTACATH PLACEMENT Right 04/02/2017   Procedure: INSERTION PORT-A-CATH;  Surgeon: [name], MD;  [facility];  Service: General;  Laterality: Right;   REMOVAL OF BILATERAL TISSUE EXPANDERS WITH PLACEMENT OF BILATERAL BREAST IMPLANTS Bilateral 01/17/2018   Procedure: REMOVAL OF BILATERAL TISSUE EXPANDERS WITH PLACEMENT OF BILATERAL SILICONE BREAST IMPLANTS;  Surgeon: [name], MD;  Location: [facility];  Service: Plastics;  Laterality: Bilateral;   TUBAL LIGATION  09/2000   bilateral     Family History  Problem Relation Age of Onset   Hypertension Father  Diabetes Father    Other Father        stomach mass suspicious for ca, never bx   Coronary artery disease Mother    Kidney cancer Mother 82   Coronary artery disease Maternal Grandfather    Colon cancer Maternal Grandfather 61       recurrence   Coronary artery disease Maternal Uncle    Coronary artery disease Maternal Uncle    Diabetes Maternal Uncle    Colon cancer Maternal Grandmother 58   Breast cancer Cousin 33       had GT- CHEK2+   Cervical cancer Maternal Aunt 35    Diabetes Maternal Aunt    Alzheimer's disease Paternal Grandmother    Colon cancer Paternal Grandfather 28   Breast cancer Paternal Aunt    Breast cancer Paternal Aunt    Breast cancer Other 23    Social History   Social History Narrative   She works as Scientist, product/process development.   She lives at home with husband. Married since 1998. They have 1 daughter   Highest level of education: associates   Social History   Tobacco Use   Smoking status: Never   Smokeless tobacco: Never  Substance Use Topics   Alcohol use: Yes    Comment: Occasionally drinks wine    Current Meds  Medication Sig   albuterol (VENTOLIN HFA) 108 (90 Base) MCG/ACT inhaler    ALPRAZolam (XANAX) 0.25 MG tablet Take 0.25 mg by mouth daily at 10 pm. Only at bedtime as need   Carboxymeth-Glyc-Polysorb PF (REFRESH OPTIVE MEGA-3) 0.5-1-0.5 % SOLN    Cholecalciferol (VITAMIN D) 2000 units CAPS Take 2,000 Units by mouth daily.   estradiol (ESTRACE) 1 MG tablet Take 1.5 tablets (1.5 mg total) by mouth daily.   famotidine (PEPCID) 20 MG tablet 1 tablet   Multiple Minerals-Vitamins (CALCIUM-MAGNESIUM-ZINC) TABS Take 1 tablet by mouth daily.    Multiple Vitamins-Minerals (MULTIVITAMINS THER. W/MINERALS) TABS Take 1 tablet by mouth daily.    Omega-3 Fatty Acids (FISH OIL) 1200 MG CAPS 1 capsule   Probiotic Product (PROBIOTIC BLEND PO) 1 capsule   progesterone (PROMETRIUM) 100 MG capsule Take 1 capsule (100 mg total) by mouth daily.   progesterone (PROMETRIUM) 200 MG capsule Take 2 capsules (400 mg total) by mouth daily.   Testosterone 1.62 % GEL Place 5 mg onto the skin 2 (two) times daily.       Objective:   Today's Vitals: BP 122/71 (BP Location: Right Arm, Patient Position: Sitting, Cuff Size: Normal)   Pulse 77   Temp 97.9 F (36.6 C) (Temporal)   Resp 18   Ht '5\' 5"'  (1.651 m)   Wt 157 lb (71.2 kg)   SpO2 98%   BMI 26.13 kg/m  Vitals with BMI 12/20/2020 10/20/2020 10/10/2020  Height '5\' 5"'  '5\' 5"'  '5\' 5"'   Weight 157  lbs 154 lbs 155 lbs 6 oz  BMI 26.13 02.40 97.35  Systolic 329 924 268  Diastolic 71 76 69  Pulse 77 83 69     Physical Exam  She looks systemically well.  She has gained about 3 pounds.  Blood pressure is excellent.     Assessment   1. Hypoactive sexual desire disorder   2. Primary ovarian failure       Tests ordered Orders Placed This Encounter  Procedures   Estradiol   Progesterone   Testos,Total,Free and SHBG (Female)      Plan: 1.  She will continue with the same dose of estradiol  and progesterone and I will check levels now to see if we need to make any adjustments. 2.  As far as testosterone therapy is concerned, I recommended that she go up on the dose to 2 clicks twice a day which would represent 10 mg twice a day of the 2% testosterone cream to see if it will benefit her. 3.  Follow-up as scheduled in the beginning of September.    No orders of the defined types were placed in this encounter.   Doree Albee, MD

## 2020-12-21 ENCOUNTER — Telehealth (INDEPENDENT_AMBULATORY_CARE_PROVIDER_SITE_OTHER): Payer: Self-pay

## 2020-12-21 NOTE — Telephone Encounter (Signed)
Patient called and has some questions regarding your call earlier. Message sent to your voicemail.

## 2020-12-21 NOTE — Telephone Encounter (Signed)
-----   Message from Doree Albee, MD sent at 12/21/2020  6:45 AM EDT ----- Regarding: FW: Please call the patient and tell her that her Progesterone level is in a good range now so continue with same dose('300mg'$ ) every night. Her Estradiol level is better,so continue with the same dose. Hopefully,the pelvic bloating will get better,so ask her to persevere with these doses for now-if she can't tolerate it,she can stop the Estradiol but continue with the Progesterone until I get back. Testosterone levels will not come back for a few days but we already made plans to increase the Testosterone anyway. Thanks ----- Message ----- From: [facility] Lab Results In Sent: 12/21/2020  12:13 AM EDT To: Doree Albee, MD

## 2020-12-22 ENCOUNTER — Other Ambulatory Visit (INDEPENDENT_AMBULATORY_CARE_PROVIDER_SITE_OTHER): Payer: Self-pay | Admitting: Internal Medicine

## 2020-12-22 DIAGNOSIS — M542 Cervicalgia: Secondary | ICD-10-CM | POA: Diagnosis not present

## 2020-12-22 DIAGNOSIS — M9901 Segmental and somatic dysfunction of cervical region: Secondary | ICD-10-CM | POA: Diagnosis not present

## 2020-12-22 DIAGNOSIS — M9904 Segmental and somatic dysfunction of sacral region: Secondary | ICD-10-CM | POA: Diagnosis not present

## 2020-12-22 DIAGNOSIS — M9902 Segmental and somatic dysfunction of thoracic region: Secondary | ICD-10-CM | POA: Diagnosis not present

## 2020-12-24 LAB — TESTOS,TOTAL,FREE AND SHBG (FEMALE)
Free Testosterone: 10 pg/mL — ABNORMAL HIGH (ref 0.1–6.4)
Sex Hormone Binding: 48 nmol/L (ref 17–124)
Testosterone, Total, LC-MS-MS: 79 ng/dL — ABNORMAL HIGH (ref 2–45)

## 2020-12-24 LAB — PROGESTERONE: Progesterone: 13.7 ng/mL

## 2020-12-24 LAB — ESTRADIOL: Estradiol: 60 pg/mL

## 2021-01-05 DIAGNOSIS — Z853 Personal history of malignant neoplasm of breast: Secondary | ICD-10-CM | POA: Diagnosis not present

## 2021-01-05 DIAGNOSIS — Z9013 Acquired absence of bilateral breasts and nipples: Secondary | ICD-10-CM | POA: Diagnosis not present

## 2021-01-09 DIAGNOSIS — N951 Menopausal and female climacteric states: Secondary | ICD-10-CM | POA: Diagnosis not present

## 2021-01-09 DIAGNOSIS — Z853 Personal history of malignant neoplasm of breast: Secondary | ICD-10-CM | POA: Diagnosis not present

## 2021-01-19 DIAGNOSIS — M9903 Segmental and somatic dysfunction of lumbar region: Secondary | ICD-10-CM | POA: Diagnosis not present

## 2021-01-19 DIAGNOSIS — M9901 Segmental and somatic dysfunction of cervical region: Secondary | ICD-10-CM | POA: Diagnosis not present

## 2021-01-19 DIAGNOSIS — M9902 Segmental and somatic dysfunction of thoracic region: Secondary | ICD-10-CM | POA: Diagnosis not present

## 2021-01-19 DIAGNOSIS — M9904 Segmental and somatic dysfunction of sacral region: Secondary | ICD-10-CM | POA: Diagnosis not present

## 2021-01-19 DIAGNOSIS — M9905 Segmental and somatic dysfunction of pelvic region: Secondary | ICD-10-CM | POA: Diagnosis not present

## 2021-01-26 ENCOUNTER — Ambulatory Visit (INDEPENDENT_AMBULATORY_CARE_PROVIDER_SITE_OTHER): Payer: BC Managed Care – PPO | Admitting: Internal Medicine

## 2021-02-09 DIAGNOSIS — M9904 Segmental and somatic dysfunction of sacral region: Secondary | ICD-10-CM | POA: Diagnosis not present

## 2021-02-09 DIAGNOSIS — M9902 Segmental and somatic dysfunction of thoracic region: Secondary | ICD-10-CM | POA: Diagnosis not present

## 2021-02-09 DIAGNOSIS — M9903 Segmental and somatic dysfunction of lumbar region: Secondary | ICD-10-CM | POA: Diagnosis not present

## 2021-02-09 DIAGNOSIS — M9905 Segmental and somatic dysfunction of pelvic region: Secondary | ICD-10-CM | POA: Diagnosis not present

## 2021-03-09 DIAGNOSIS — Z139 Encounter for screening, unspecified: Secondary | ICD-10-CM | POA: Diagnosis not present

## 2021-03-09 DIAGNOSIS — M9905 Segmental and somatic dysfunction of pelvic region: Secondary | ICD-10-CM | POA: Diagnosis not present

## 2021-03-09 DIAGNOSIS — N951 Menopausal and female climacteric states: Secondary | ICD-10-CM | POA: Diagnosis not present

## 2021-03-09 DIAGNOSIS — M9904 Segmental and somatic dysfunction of sacral region: Secondary | ICD-10-CM | POA: Diagnosis not present

## 2021-03-09 DIAGNOSIS — M9902 Segmental and somatic dysfunction of thoracic region: Secondary | ICD-10-CM | POA: Diagnosis not present

## 2021-03-09 DIAGNOSIS — Z1321 Encounter for screening for nutritional disorder: Secondary | ICD-10-CM | POA: Diagnosis not present

## 2021-03-09 DIAGNOSIS — M9903 Segmental and somatic dysfunction of lumbar region: Secondary | ICD-10-CM | POA: Diagnosis not present

## 2021-03-16 DIAGNOSIS — N959 Unspecified menopausal and perimenopausal disorder: Secondary | ICD-10-CM | POA: Diagnosis not present

## 2021-03-23 DIAGNOSIS — L820 Inflamed seborrheic keratosis: Secondary | ICD-10-CM | POA: Diagnosis not present

## 2021-03-23 DIAGNOSIS — D045 Carcinoma in situ of skin of trunk: Secondary | ICD-10-CM | POA: Diagnosis not present

## 2021-03-23 DIAGNOSIS — D485 Neoplasm of uncertain behavior of skin: Secondary | ICD-10-CM | POA: Diagnosis not present

## 2021-03-23 DIAGNOSIS — L821 Other seborrheic keratosis: Secondary | ICD-10-CM | POA: Diagnosis not present

## 2021-03-30 ENCOUNTER — Inpatient Hospital Stay: Payer: BC Managed Care – PPO | Attending: Oncology | Admitting: Oncology

## 2021-03-30 DIAGNOSIS — Z8049 Family history of malignant neoplasm of other genital organs: Secondary | ICD-10-CM | POA: Insufficient documentation

## 2021-03-30 DIAGNOSIS — Z8249 Family history of ischemic heart disease and other diseases of the circulatory system: Secondary | ICD-10-CM | POA: Diagnosis not present

## 2021-03-30 DIAGNOSIS — Z803 Family history of malignant neoplasm of breast: Secondary | ICD-10-CM | POA: Diagnosis not present

## 2021-03-30 DIAGNOSIS — Z885 Allergy status to narcotic agent status: Secondary | ICD-10-CM | POA: Insufficient documentation

## 2021-03-30 DIAGNOSIS — N6092 Unspecified benign mammary dysplasia of left breast: Secondary | ICD-10-CM | POA: Insufficient documentation

## 2021-03-30 DIAGNOSIS — Z833 Family history of diabetes mellitus: Secondary | ICD-10-CM | POA: Insufficient documentation

## 2021-03-30 DIAGNOSIS — Z800 Family history of malignant neoplasm of digestive organs: Secondary | ICD-10-CM | POA: Diagnosis not present

## 2021-03-30 DIAGNOSIS — C50411 Malignant neoplasm of upper-outer quadrant of right female breast: Secondary | ICD-10-CM | POA: Diagnosis not present

## 2021-03-30 DIAGNOSIS — C50211 Malignant neoplasm of upper-inner quadrant of right female breast: Secondary | ICD-10-CM | POA: Diagnosis not present

## 2021-03-30 DIAGNOSIS — Z9013 Acquired absence of bilateral breasts and nipples: Secondary | ICD-10-CM | POA: Insufficient documentation

## 2021-03-30 DIAGNOSIS — Z85828 Personal history of other malignant neoplasm of skin: Secondary | ICD-10-CM | POA: Insufficient documentation

## 2021-03-30 DIAGNOSIS — Z818 Family history of other mental and behavioral disorders: Secondary | ICD-10-CM | POA: Insufficient documentation

## 2021-03-30 DIAGNOSIS — Z8051 Family history of malignant neoplasm of kidney: Secondary | ICD-10-CM | POA: Diagnosis not present

## 2021-03-30 DIAGNOSIS — M9904 Segmental and somatic dysfunction of sacral region: Secondary | ICD-10-CM | POA: Diagnosis not present

## 2021-03-30 DIAGNOSIS — M9905 Segmental and somatic dysfunction of pelvic region: Secondary | ICD-10-CM | POA: Diagnosis not present

## 2021-03-30 DIAGNOSIS — Z171 Estrogen receptor negative status [ER-]: Secondary | ICD-10-CM

## 2021-03-30 DIAGNOSIS — M9902 Segmental and somatic dysfunction of thoracic region: Secondary | ICD-10-CM | POA: Diagnosis not present

## 2021-03-30 DIAGNOSIS — M9903 Segmental and somatic dysfunction of lumbar region: Secondary | ICD-10-CM | POA: Diagnosis not present

## 2021-03-30 NOTE — Progress Notes (Signed)
[place]  [phone] [phone]    ID: Jennifer Beck DOB: Oct 09, 1965  MR#: [id number]  XBD#:[id number]  Patient Care Team: Dian Queen, MD as PCP - General (Obstetrics and Gynecology) [name], MD as Consulting Physician (Dermatology) [name], DC as Referring Physician (Chiropractic Medicine) [name], MD as Attending Physician (Radiation Oncology) [name], MD as Consulting Physician (Plastic Surgery) Alda Berthold, DO as Consulting Physician (Neurology) Roseanne Kaufman, MD as Consulting Physician (Orthopedic Surgery) OTHER MD:  I connected with Jennifer Beck on 03/30/21 at  2:00 PM EDT by video enabled telemedicine visit and verified that I am speaking with the correct person using two identifiers.   I discussed the limitations, risks, security and privacy concerns of performing an evaluation and management service by telemedicine and the availability of in-person appointments. I also discussed with the patient that there may be a patient responsible charge related to this service. The patient expressed understanding and agreed to proceed.   Other persons participating in the visit and their role in the encounter: None  [name] location: Work Provider's location: [facility]   I provided 15 minutes of face-to-face video visit time during this encounter, and > 50% was spent counseling as documented under my assessment & plan.   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 She continues under observation.   She was working with Dr. Anastasio Champion who suddenly died.  She then moved her care to Dr. Runell Gess and she obtained a repeat pelvic ultrasound within the last 3 weeks, Jennifer Beck tells me, and "everything was fine".  She is  also managing [name]'s hormones   REVIEW OF SYSTEMS:  Jennifer Beck is stressed at work and she is not exercising as much as she would like but she is doing some walking.  She had a squamous cell removed from her back.  She is now sleeping much better, 7 and 8 hours a night and she is doing some intermittent fasting for weight control.  She tells me her weight is stable.  Aside from these issues a detailed review of systems today was noncontributory   COVID 19 VACCINATION STATUS: Never vaccinated; had COVID April 2022   HISTORY OF CURRENT ILLNESS:  From the original intake note:  Jennifer Beck palpated a mass in her right breast sometime in September 2018.[name] She brought this to medical attention and her PCP, [name], set her up on 03/18/2017 for a unilateral right diagnostic mammography with ultrasonography, showing: Breast density D. The palpable right breast mass at 12:30 was indeterminate, and biopsy was warranted at that time. No evidence of right axillary lymphadenopathy was noted. Biopsy of the lesion in question on 03/21/2017 at the right breast 12:30 position showed ([id number])  invasive ductal carcinoma with lymphovascular invasion.  The tumor was grade 3, triple negative, with an MIB-1 of 80%.  She is accompanied by her husband, Jennifer Beck to the office today. She reports that she is doing well overall. She initially felt a lump in September in the shower and notes that she doesn't complete monthly self breast exams. She had a routine mammogram in March 2018 at [facility] and she is followed by Dr. [name].  As far as surgeries, she has had two laparoscopic procedures for endometriosis as well as a bilateral tubal ligation. She still has occasional cramping, painful  ovulation, and vaginal spotting that comes and goes. She has had cyst to her breast before that have been evaluated and ruled as negative. She notes that she still has her tonsils, gallbladder, and appendix. She  has a prior hx of pericarditis in 2003 that she notes was brought onby a virus. She has since been cleared by her prior Cardiologist.   She has a Land, Dr. Clide Dales that she sees every 5 weeks for maintenance of her neck and back issues. She has a dermatologist, Dr. Doreen Beam at [facility] and she has had an biopsy of an area from her left chest that resulted as basal cell carcinoma in 2015. She denies having a GI specialist, Cardiologist, or Orthopedist at this time. She denies prior history of seizures, migraines, acid reflux, asthma, emphysema, palpitations or GI issues.   [name] is as detailed below.   PAST MEDICAL HISTORY: Past Medical History:  Diagnosis Date   Arthritis    leg and muscle pains   Chest pain    Complication of anesthesia    Endometriosis    Family history of breast cancer 04/22/2017   Family history of colon cancer 04/22/2017   Family history of kidney cancer 04/22/2017   Fatigue    GERD (gastroesophageal reflux disease)    GI bleed    caused by ischemic colitis following an episode of hypertension   Heart palpitations    Hx of echocardiogram    a. echo 9/13:  EF 55-60%, Gr 2 diast dysfn   Hypercholesterolemia    Ischemic colitis (HCC)    Myocarditis (HCC)    Pericarditis    PONV (postoperative nausea and vomiting)    Restrictive pericarditis 2004   SOB (shortness of breath)     PAST SURGICAL HISTORY: Past Surgical History:  Procedure Laterality Date   BREAST RECONSTRUCTION WITH PLACEMENT OF TISSUE EXPANDER AND ALLODERM Bilateral 10/08/2017   Procedure: BREAST RECONSTRUCTION WITH PLACEMENT OF TISSUE EXPANDER AND ALLODERM;  Surgeon: Glenna Fellows, MD;  Location: [facility];  Service: Plastics;  Laterality: Bilateral;   CARDIAC CATHETERIZATION  08/26/2001   EF of 60% --- smooth & normal coronary arteries -- there is a Godsil motion defect consistent with posterolateral MI -- she quite  possibly had a coronary spasm -- she may have some pericarditis secondary to her MI    DILATION AND CURETTAGE OF UTERUS  10/15/2003   Uterine enlargement, menorrhagia   DILATION AND CURETTAGE OF UTERUS  05/27/2002   Dysfunctional uterine bleeding   LAPAROSCOPY     LIPOSUCTION WITH LIPOFILLING Bilateral 01/17/2018   Procedure: LIPOFILLING FROM ABDOMEN TO CHEST;  Surgeon: Glenna Fellows, MD;  Location: [facility];  Service: Plastics;  Laterality: Bilateral;   MASTECTOMY W/ SENTINEL NODE BIOPSY Bilateral 10/08/2017   Procedure: RIGHT MASTECTOMY WITH RIGHT AXILLARY SENTINEL LYMPH NODE BIOPSY AND LEFT PROPHYLACTIC MASTECTOMY;  Surgeon: Ovidio Kin, MD;  Location: [facility];  Service: General;  Laterality: Bilateral;   NOVASURE ABLATION  10/15/2003   for management of her extreme menorrhagi   PORTACATH PLACEMENT Right 04/02/2017   Procedure: INSERTION PORT-A-CATH;  Surgeon: Ovidio Kin, MD;  Location: [facility];  Service: General;  Laterality: Right;   REMOVAL OF BILATERAL TISSUE EXPANDERS WITH PLACEMENT OF BILATERAL BREAST IMPLANTS Bilateral 01/17/2018   Procedure: REMOVAL OF BILATERAL TISSUE EXPANDERS WITH PLACEMENT OF BILATERAL SILICONE BREAST IMPLANTS;  Surgeon: Glenna Fellows, MD;  Location: [facility];  Service: Plastics;  Laterality: Bilateral;  TUBAL LIGATION  09/2000   bilateral    FAMILY HISTORY Family History  Problem Relation Age of Onset   Hypertension Father    Diabetes Father    Other Father        stomach mass suspicious for ca, never bx   Coronary artery disease Mother    Kidney cancer Mother 1   Coronary artery disease Maternal Grandfather    Colon cancer Maternal Grandfather 61       recurrence   Coronary artery disease Maternal Uncle    Coronary artery disease Maternal Uncle    Diabetes Maternal Uncle    Colon cancer Maternal Grandmother 58   Breast cancer Cousin 60       had GT- CHEK2+   Cervical cancer Maternal  Aunt 35   Diabetes Maternal Aunt    Alzheimer's disease Paternal Grandmother    Colon cancer Paternal Grandfather 84   Breast cancer Paternal Aunt    Breast cancer Paternal Aunt    Breast cancer Other 20  Her father died 03-03-16 at age 17 just 53 week shy of his 83rd birthday. Her mother is 18 years old as of October 2018. Patient has one brother and no sisters. Her mother was diagnosed with kidney cancer at age 89 with a nephrectomy following. Her maternal grandmother was diagnosed with colon cancer at age 11.  The patient paternal grandfather was diagnosed with colon cancer in his 24's. She has paternal cousins through her fathers half-sisters that have been diagnosed with breast cancer, she is unsure of the number of breast cancer occurrences due to the distance . A maternal cousin was diagnosed with breast cancer at age 33 and she had genetic testing. The patient does have a copy of this testing as well as her PCP.  This was not available for review on the nasal   GYNECOLOGIC HISTORY:  No LMP recorded. Patient is postmenopausal.  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 Her husband Jennifer Beck is a Airline pilot. Her daughter Jennifer Beck graduated from [employer] and currently works full-time at the [employer].  She commutes from home.  The patient attends [employer]. She lives in [place].     ADVANCED DIRECTIVES: In the absence of any documentation to the contrary, the patient's spouse is their HCPOA.    HEALTH MAINTENANCE: Social History   Tobacco Use   Smoking status: Never   Smokeless tobacco: Never  Vaping Use   Vaping Use: Never used  Substance Use Topics   Alcohol use: Yes    Comment:  Occasionally drinks wine   Drug use: No     Colonoscopy: December 2019 ([facility])  PAP: 09/2020, negative  Bone density: 09/2020, -0.5   Allergies  Allergen Reactions   Codeine Itching and Other (See Comments)   Biaxin [Clarithromycin]     Heart Burn   Eggs Or Egg-Derived Products Diarrhea    Current Outpatient Medications  Medication Sig Dispense Refill   albuterol (VENTOLIN HFA) 108 (90 Base) MCG/ACT inhaler      ALPRAZolam (XANAX) 0.25 MG tablet Take 0.25 mg by mouth daily at 10 pm. Only at bedtime as need     Carboxymeth-Glyc-Polysorb PF (REFRESH OPTIVE MEGA-3) 0.5-1-0.5 %  SOLN      Cholecalciferol (VITAMIN D) 2000 units CAPS Take 2,000 Units by mouth daily.     estradiol (ESTRACE) 1 MG tablet Take 1.5 tablets (1.5 mg total) by mouth daily. 45 tablet 3   famotidine (PEPCID) 20 MG tablet 1 tablet     Multiple Minerals-Vitamins (CALCIUM-MAGNESIUM-ZINC) TABS Take 1 tablet by mouth daily.      Multiple Vitamins-Minerals (MULTIVITAMINS THER. W/MINERALS) TABS Take 1 tablet by mouth daily.      Omega-3 Fatty Acids (FISH OIL) 1200 MG CAPS 1 capsule     Probiotic Product (PROBIOTIC BLEND PO) 1 capsule     progesterone (PROMETRIUM) 100 MG capsule Take 1 capsule (100 mg total) by mouth daily. 30 capsule 3   progesterone (PROMETRIUM) 200 MG capsule TAKE 1 CAPSULE(200 MG) BY MOUTH DAILY 30 capsule 0   Testosterone 1.62 % GEL Place 5 mg onto the skin 2 (two) times daily. 30 g 3   No current facility-administered medications for this visit.    OBJECTIVE: white woman in no acute distress There were no vitals filed for this visit.    There is no height or weight on file to calculate BMI.   Wt Readings from Last 3 Encounters:  12/20/20 157 lb (71.2 kg)  10/20/20 154 lb (69.9 kg)  10/10/20 155 lb 6.4 oz (70.5 kg)   ECOG FS:1  Telemedicine visit 03/30/2021   LAB RESULTS:  CMP     Component Value Date/Time   NA 140 10/10/2020 0838   NA 137 05/30/2017 1219   K 4.4 10/10/2020 0838   K  3.8 05/30/2017 1219   CL 106 10/10/2020 0838   CO2 26 10/10/2020 0838   CO2 25 05/30/2017 1219   GLUCOSE 93 10/10/2020 0838   GLUCOSE 122 05/30/2017 1219   BUN 11 10/10/2020 0838   BUN 8.9 05/30/2017 1219   CREATININE 0.75 10/10/2020 0838   CREATININE 0.62 08/03/2020 0000   CREATININE 0.7 05/30/2017 1219   CALCIUM 9.3 10/10/2020 0838   CALCIUM 9.4 05/30/2017 1219   PROT 7.2 10/10/2020 0838   PROT 6.4 05/30/2017 1219   ALBUMIN 4.1 10/10/2020 0838   ALBUMIN 3.7 05/30/2017 1219   AST 19 10/10/2020 0838   AST 12 05/30/2017 1219   ALT 14 10/10/2020 0838   ALT 14 05/30/2017 1219   ALKPHOS 59 10/10/2020 0838   ALKPHOS 137 05/30/2017 1219   BILITOT 0.4 10/10/2020 0838   BILITOT 0.38 05/30/2017 1219   GFRNONAA >60 10/10/2020 0838   GFRNONAA 104 08/03/2020 0000   GFRAA 120 08/03/2020 0000    Lab Results  Component Value Date   WBC 6.1 10/10/2020   NEUTROABS 3.5 10/10/2020   HGB 13.6 10/10/2020   HCT 39.6 10/10/2020   MCV 95.0 10/10/2020   PLT 285 10/10/2020   No results found for: LABCA2  No components found for: ZYSAYT016  No results for input(s): INR in the last 168 hours.  No results found for: LABCA2  No results found for: WFU932  No results found for: TFT732  No results found for: KGU542  No results found for: CA2729  No components found for: HGQUANT  No results found for: CEA1 / No results found for: CEA1   No results found for: AFPTUMOR  No results found for: CHROMOGRNA  No results found for: TOTALPROTELP, ALBUMINELP, A1GS, A2GS, BETS, BETA2SER, GAMS, MSPIKE, SPEI (this displays SPEP labs)  No results found for: KPAFRELGTCHN, LAMBDASER, KAPLAMBRATIO (kappa/lambda light chains)  No results found for: HGBA, HGBA2QUANT, HGBFQUANT,  HGBSQUAN (Hemoglobinopathy evaluation)   No results found for: LDH  No results found for: IRON, TIBC, IRONPCTSAT (Iron and TIBC)  No results found for: FERRITIN  Urinalysis No results found for: COLORURINE,  APPEARANCEUR, LABSPEC, PHURINE, GLUCOSEU, HGBUR, BILIRUBINUR, KETONESUR, PROTEINUR, UROBILINOGEN, NITRITE, LEUKOCYTESUR   STUDIES: No results found.   ELIGIBLE FOR AVAILABLE RESEARCH PROTOCOL: Enrolled in cancer disparities study   ASSESSMENT: 55 y.o. [place] woman status post right breast upper inner quadrant biopsy March 21, 2017 for a clinical T2 N0  Stage IIB invasive ductal carcinoma, grade 3, triple negative, with an MIB-1 of 80%.  (a) biopsy of 2 additional suspicious areas in the right breast and one in the left breast 04/11/2017 showed atypical lobular hyperplasia and sclerosing adenosis but no evidence of cancer  (1) neoadjuvant chemotherapy consisting of doxorubicin and cyclophosphamide in dose dense fashion x4 completed 05/23/2017, followed by paclitaxel/carboplatin weekly x12, first dose 06/06/2017, completed 08/29/2017  (2) status post bilateral mastectomies 10/08/2017, showing  (a) left breast, atypical lobular hyperplasia, no evidence of malignancy  (b) right breast, residual  pT1b pN0 invasive ductal carcinoma, grade 3, with negative margins.  A total of 2 sentinel lymph nodes were removed; repeat prognostic panel requested 10/18/2017  (c) immediate expander reconstruction with silicone implants on 20/80/2233  (d) repeat prognostic panel again triple negative  (3) adjuvant radiation not indicated  (4) genetics testing 04/22/2017 through the common hereditary cancer panel plus renal/urinary tract cancel panel showed no deleterious mutations in APC, ATM, AXIN2, BAP1, BARD1, BMPR1A, BRCA1, BRCA2, BRIP1, BUB1B, CDC73, CDH1, CDK4, CDKN1C, CDKN2A (p14ARF), CDKN2A (p16INK4a), CEP57, CHEK2, CTNNA1, DICER1, DIS3L2, EPCAM*, FH, FLCN, GPC3, GREM1*, KIT, MEN1, MET, MLH1,MSH2, MSH3, MSH6, MUTYH, NBN, NF1, PALB2, PDGFRA, PMS2, POLD1, POLE, PTEN, RAD50, RAD51C, RAD51D, SDHB,SDHC, SDHD, SMAD4, SMARCA4, SMARCB1, STK11, TP53, TSC1, TSC2, VHL, WT1.The following genes were evaluated for  sequence changes only: HOXB13*, MITF*, NTHL1*, SDHA  (a) there was a Variant of Uncertain Significance identified in POLD1, namely c.2953C>T (p.Arg985Trp)  (5) declined participation in S 1418 due to concerns regarding side effects   PLAN: Simrin is now 3-1/2 years out from definitive surgery for her breast cancer with no evidence of disease recurrence.  This is very favorable.  She is now under the care of Dr. Helane Rima and I suggested perhaps she could simply be followed through her group but Jennifer Beck likes the idea of completing our 5 years of follow-up so I have set her up for a repeat visit in May with lab work and at that time she will consider "graduating".  I have encouraged her to have the new SARS-CoV-2 booster and to exercise more regularly.  She knows to call for any other issue that may develop before the next visit.  Total encounter time 15 minutes.* Total encounter time 25 minutes.*   Euline Kimbler, Virgie Dad, MD  03/30/21 2:24 PM Medical Oncology and Hematology [facility] [place], [place] [address] Tel. [phone]    Fax. [phone]   I, Wilburn Mylar, am acting as scribe for Dr. Virgie Dad. Annalyce Lanpher.  I, Lurline Del MD, have reviewed the above documentation for accuracy and completeness, and I agree with the above.   *Total Encounter Time as defined by the Centers for Medicare and Medicaid Services includes, in addition to the face-to-face time of a patient visit (documented in the note above) non-face-to-face time: obtaining and reviewing outside history, ordering and reviewing medications, tests or procedures, care coordination (communications with other health care professionals or  caregivers) and documentation in the medical record.

## 2021-04-05 DIAGNOSIS — D045 Carcinoma in situ of skin of trunk: Secondary | ICD-10-CM | POA: Diagnosis not present

## 2021-04-12 DIAGNOSIS — R079 Chest pain, unspecified: Secondary | ICD-10-CM | POA: Diagnosis not present

## 2021-04-14 DIAGNOSIS — M9902 Segmental and somatic dysfunction of thoracic region: Secondary | ICD-10-CM | POA: Diagnosis not present

## 2021-04-14 DIAGNOSIS — M9903 Segmental and somatic dysfunction of lumbar region: Secondary | ICD-10-CM | POA: Diagnosis not present

## 2021-04-14 DIAGNOSIS — M9904 Segmental and somatic dysfunction of sacral region: Secondary | ICD-10-CM | POA: Diagnosis not present

## 2021-04-14 DIAGNOSIS — M9905 Segmental and somatic dysfunction of pelvic region: Secondary | ICD-10-CM | POA: Diagnosis not present

## 2021-05-18 DIAGNOSIS — M9902 Segmental and somatic dysfunction of thoracic region: Secondary | ICD-10-CM | POA: Diagnosis not present

## 2021-05-18 DIAGNOSIS — M9904 Segmental and somatic dysfunction of sacral region: Secondary | ICD-10-CM | POA: Diagnosis not present

## 2021-05-18 DIAGNOSIS — M9903 Segmental and somatic dysfunction of lumbar region: Secondary | ICD-10-CM | POA: Diagnosis not present

## 2021-05-18 DIAGNOSIS — M9905 Segmental and somatic dysfunction of pelvic region: Secondary | ICD-10-CM | POA: Diagnosis not present

## 2021-05-23 DIAGNOSIS — H16141 Punctate keratitis, right eye: Secondary | ICD-10-CM | POA: Diagnosis not present

## 2021-05-23 DIAGNOSIS — H0288A Meibomian gland dysfunction right eye, upper and lower eyelids: Secondary | ICD-10-CM | POA: Diagnosis not present

## 2021-05-23 DIAGNOSIS — H16223 Keratoconjunctivitis sicca, not specified as Sjogren's, bilateral: Secondary | ICD-10-CM | POA: Diagnosis not present

## 2021-05-23 DIAGNOSIS — H0288B Meibomian gland dysfunction left eye, upper and lower eyelids: Secondary | ICD-10-CM | POA: Diagnosis not present

## 2021-06-08 DIAGNOSIS — N959 Unspecified menopausal and perimenopausal disorder: Secondary | ICD-10-CM | POA: Diagnosis not present

## 2021-06-15 DIAGNOSIS — M9904 Segmental and somatic dysfunction of sacral region: Secondary | ICD-10-CM | POA: Diagnosis not present

## 2021-06-15 DIAGNOSIS — I10 Essential (primary) hypertension: Secondary | ICD-10-CM | POA: Diagnosis not present

## 2021-06-15 DIAGNOSIS — M9903 Segmental and somatic dysfunction of lumbar region: Secondary | ICD-10-CM | POA: Diagnosis not present

## 2021-06-15 DIAGNOSIS — M9905 Segmental and somatic dysfunction of pelvic region: Secondary | ICD-10-CM | POA: Diagnosis not present

## 2021-06-15 DIAGNOSIS — M9902 Segmental and somatic dysfunction of thoracic region: Secondary | ICD-10-CM | POA: Diagnosis not present

## 2021-06-30 DIAGNOSIS — H16141 Punctate keratitis, right eye: Secondary | ICD-10-CM | POA: Diagnosis not present

## 2021-06-30 DIAGNOSIS — H16223 Keratoconjunctivitis sicca, not specified as Sjogren's, bilateral: Secondary | ICD-10-CM | POA: Diagnosis not present

## 2021-07-17 DIAGNOSIS — M9905 Segmental and somatic dysfunction of pelvic region: Secondary | ICD-10-CM | POA: Diagnosis not present

## 2021-07-17 DIAGNOSIS — M9904 Segmental and somatic dysfunction of sacral region: Secondary | ICD-10-CM | POA: Diagnosis not present

## 2021-07-17 DIAGNOSIS — M9902 Segmental and somatic dysfunction of thoracic region: Secondary | ICD-10-CM | POA: Diagnosis not present

## 2021-07-17 DIAGNOSIS — M9903 Segmental and somatic dysfunction of lumbar region: Secondary | ICD-10-CM | POA: Diagnosis not present

## 2021-08-10 DIAGNOSIS — M9903 Segmental and somatic dysfunction of lumbar region: Secondary | ICD-10-CM | POA: Diagnosis not present

## 2021-08-10 DIAGNOSIS — M9902 Segmental and somatic dysfunction of thoracic region: Secondary | ICD-10-CM | POA: Diagnosis not present

## 2021-08-10 DIAGNOSIS — M222X1 Patellofemoral disorders, right knee: Secondary | ICD-10-CM | POA: Diagnosis not present

## 2021-08-10 DIAGNOSIS — M9905 Segmental and somatic dysfunction of pelvic region: Secondary | ICD-10-CM | POA: Diagnosis not present

## 2021-08-10 DIAGNOSIS — M25561 Pain in right knee: Secondary | ICD-10-CM | POA: Diagnosis not present

## 2021-08-10 DIAGNOSIS — M9904 Segmental and somatic dysfunction of sacral region: Secondary | ICD-10-CM | POA: Diagnosis not present

## 2021-09-04 ENCOUNTER — Telehealth: Payer: Self-pay | Admitting: Hematology and Oncology

## 2021-09-04 NOTE — Telephone Encounter (Signed)
.  Called patient to schedule appointment per 4/6 inbasket, patient is aware of date and time.   ?

## 2021-09-07 DIAGNOSIS — M9904 Segmental and somatic dysfunction of sacral region: Secondary | ICD-10-CM | POA: Diagnosis not present

## 2021-09-07 DIAGNOSIS — M9903 Segmental and somatic dysfunction of lumbar region: Secondary | ICD-10-CM | POA: Diagnosis not present

## 2021-09-07 DIAGNOSIS — M9905 Segmental and somatic dysfunction of pelvic region: Secondary | ICD-10-CM | POA: Diagnosis not present

## 2021-09-07 DIAGNOSIS — M9902 Segmental and somatic dysfunction of thoracic region: Secondary | ICD-10-CM | POA: Diagnosis not present

## 2021-09-25 ENCOUNTER — Inpatient Hospital Stay: Payer: BC Managed Care – PPO | Attending: Hematology and Oncology | Admitting: Hematology and Oncology

## 2021-09-25 ENCOUNTER — Inpatient Hospital Stay: Payer: BC Managed Care – PPO

## 2021-09-25 ENCOUNTER — Other Ambulatory Visit: Payer: Self-pay

## 2021-09-25 VITALS — BP 130/73 | HR 74 | Temp 97.7°F | Resp 18 | Ht 65.0 in | Wt 163.2 lb

## 2021-09-25 DIAGNOSIS — Z818 Family history of other mental and behavioral disorders: Secondary | ICD-10-CM | POA: Insufficient documentation

## 2021-09-25 DIAGNOSIS — Z8051 Family history of malignant neoplasm of kidney: Secondary | ICD-10-CM | POA: Insufficient documentation

## 2021-09-25 DIAGNOSIS — Z79899 Other long term (current) drug therapy: Secondary | ICD-10-CM | POA: Insufficient documentation

## 2021-09-25 DIAGNOSIS — Z85828 Personal history of other malignant neoplasm of skin: Secondary | ICD-10-CM | POA: Diagnosis not present

## 2021-09-25 DIAGNOSIS — Z800 Family history of malignant neoplasm of digestive organs: Secondary | ICD-10-CM | POA: Diagnosis not present

## 2021-09-25 DIAGNOSIS — Z8049 Family history of malignant neoplasm of other genital organs: Secondary | ICD-10-CM | POA: Insufficient documentation

## 2021-09-25 DIAGNOSIS — Z885 Allergy status to narcotic agent status: Secondary | ICD-10-CM | POA: Diagnosis not present

## 2021-09-25 DIAGNOSIS — Z833 Family history of diabetes mellitus: Secondary | ICD-10-CM | POA: Insufficient documentation

## 2021-09-25 DIAGNOSIS — C50411 Malignant neoplasm of upper-outer quadrant of right female breast: Secondary | ICD-10-CM

## 2021-09-25 DIAGNOSIS — Z8679 Personal history of other diseases of the circulatory system: Secondary | ICD-10-CM | POA: Insufficient documentation

## 2021-09-25 DIAGNOSIS — C50211 Malignant neoplasm of upper-inner quadrant of right female breast: Secondary | ICD-10-CM | POA: Insufficient documentation

## 2021-09-25 DIAGNOSIS — Z171 Estrogen receptor negative status [ER-]: Secondary | ICD-10-CM

## 2021-09-25 DIAGNOSIS — Z8249 Family history of ischemic heart disease and other diseases of the circulatory system: Secondary | ICD-10-CM | POA: Diagnosis not present

## 2021-09-25 DIAGNOSIS — Z9013 Acquired absence of bilateral breasts and nipples: Secondary | ICD-10-CM | POA: Insufficient documentation

## 2021-09-25 DIAGNOSIS — Z803 Family history of malignant neoplasm of breast: Secondary | ICD-10-CM | POA: Diagnosis not present

## 2021-09-25 LAB — CBC WITH DIFFERENTIAL (CANCER CENTER ONLY)
Abs Immature Granulocytes: 0.04 10*3/uL (ref 0.00–0.07)
Basophils Absolute: 0.1 10*3/uL (ref 0.0–0.1)
Basophils Relative: 1 %
Eosinophils Absolute: 0.3 10*3/uL (ref 0.0–0.5)
Eosinophils Relative: 4 %
HCT: 40.3 % (ref 36.0–46.0)
Hemoglobin: 14 g/dL (ref 12.0–15.0)
Immature Granulocytes: 1 %
Lymphocytes Relative: 32 %
Lymphs Abs: 2.3 10*3/uL (ref 0.7–4.0)
MCH: 33.3 pg (ref 26.0–34.0)
MCHC: 34.7 g/dL (ref 30.0–36.0)
MCV: 95.7 fL (ref 80.0–100.0)
Monocytes Absolute: 0.6 10*3/uL (ref 0.1–1.0)
Monocytes Relative: 8 %
Neutro Abs: 3.9 10*3/uL (ref 1.7–7.7)
Neutrophils Relative %: 54 %
Platelet Count: 336 10*3/uL (ref 150–400)
RBC: 4.21 MIL/uL (ref 3.87–5.11)
RDW: 12.1 % (ref 11.5–15.5)
WBC Count: 7.1 10*3/uL (ref 4.0–10.5)
nRBC: 0 % (ref 0.0–0.2)

## 2021-09-25 LAB — CMP (CANCER CENTER ONLY)
ALT: 21 U/L (ref 0–44)
AST: 19 U/L (ref 15–41)
Albumin: 4.3 g/dL (ref 3.5–5.0)
Alkaline Phosphatase: 49 U/L (ref 38–126)
Anion gap: 5 (ref 5–15)
BUN: 14 mg/dL (ref 6–20)
CO2: 29 mmol/L (ref 22–32)
Calcium: 9.5 mg/dL (ref 8.9–10.3)
Chloride: 102 mmol/L (ref 98–111)
Creatinine: 0.78 mg/dL (ref 0.44–1.00)
GFR, Estimated: >60 mL/min (ref >60)
Glucose, Bld: 97 mg/dL (ref 70–99)
Potassium: 4.3 mmol/L (ref 3.5–5.1)
Sodium: 136 mmol/L (ref 135–145)
Total Bilirubin: 0.4 mg/dL (ref 0.3–1.2)
Total Protein: 7.1 g/dL (ref 6.5–8.1)

## 2021-09-25 NOTE — Progress Notes (Signed)
?[name]  ?[phone] [phone]  ? ? ? ?ID: [name] DOB: 1966/04/21  MR#: [id number]  OQH#:[id number] ? ?Patient Care Team: ?Dian Queen, MD as PCP - General (Obstetrics and Gynecology) ?[name], MD as Consulting Physician (Dermatology) ?[name], DC as Referring Physician (Chiropractic Medicine) ?[name], MD as Attending Physician (Radiation Oncology) ?[name], MD as Consulting Physician (Plastic Surgery) ?Alda Berthold, DO as Consulting Physician (Neurology) ?Roseanne Kaufman, MD as Consulting Physician (Orthopedic Surgery) ?OTHER MD: ? ?CHIEF COMPLAINT: Triple negative breast cancer (s/p bilateral mastectomies) ? ?CURRENT TREATMENT: Observation  ? ? ?INTERVAL HISTORY: ?Jennifer Beck returns today for follow-up of her triple negative breast cancer. She continues under observation.  ?She denies any new health complaints.  No new breast changes.  No change in breathing, bowel habits or urinary habits.  No change in implants. ?Rest of the pertinent 10 point ROS reviewed and negative. ? ? ? COVID 19 VACCINATION STATUS: Never vaccinated; had COVID April 2022 ? ? ?HISTORY OF CURRENT ILLNESS:  ?From the original intake note: ? ?Jennifer Beck palpated a mass in her right breast sometime in September 2018.[name] She brought this to medical attention and her PCP, [name], set her up on 03/18/2017 for a unilateral right diagnostic mammography with ultrasonography, showing: Breast density D. The palpable right breast mass at 12:30 was indeterminate, and biopsy was warranted at that time. No evidence of right axillary lymphadenopathy was noted. Biopsy of the lesion in question on 03/21/2017 at the right breast 12:30 position showed ([id number])  invasive ductal carcinoma with lymphovascular invasion.  The tumor was grade 3, triple negative, with an MIB-1 of 80%. ? ?She is accompanied by her husband, Jennifer Beck to the office today. She reports that  she is doing well overall. She initially felt a lump in September in the shower and notes that she doesn't complete monthly self breast exams. She had a routine mammogram in March 2018 at [facility] and she is followed by Dr. [name]. ? ?As far as surgeries, she has had two laparoscopic procedures for endometriosis as well as a bilateral tubal ligation. She still has occasional cramping, painful ovulation, and vaginal spotting that comes and goes. She has had cyst to her breast before that have been evaluated and ruled as negative. She notes that she still has her tonsils, gallbladder, and appendix. She has a prior hx of pericarditis in 2003 that she notes was brought onby a virus. She has since been cleared by her prior Cardiologist.  ? ?She has a Restaurant manager, fast food, [name] that she sees every 5 weeks for maintenance of her neck and back issues. She has a dermatologist, [name] at [facility] and she has had an biopsy of an area from her left chest that resulted as basal cell carcinoma in 2015. She denies having a GI specialist, Cardiologist, or Orthopedist at this time. She denies prior history of seizures, migraines, acid reflux, asthma, emphysema, palpitations or GI issues.  ? ?[name] is as detailed below. ? ? ?PAST MEDICAL HISTORY: ?Past Medical History:  ?Diagnosis Date  ? Arthritis   ? leg and muscle pains  ? Chest pain   ? Complication of anesthesia   ? Endometriosis   ? Family history of breast cancer 04/22/2017  ? Family history of colon cancer 04/22/2017  ? Family history of kidney cancer 04/22/2017  ? Fatigue   ? GERD (gastroesophageal reflux disease)   ? GI bleed   ?  caused by ischemic colitis following an episode of hypertension  ? Heart palpitations   ? Hx of echocardiogram   ? a. echo 9/13:  EF 55-60%, Gr 2 diast dysfn  ? Hypercholesterolemia   ? Ischemic colitis ([place])   ? Myocarditis ([place])   ? Pericarditis   ? PONV (postoperative nausea  and vomiting)   ? Restrictive pericarditis 2004  ? SOB (shortness of breath)   ? ? ?PAST SURGICAL HISTORY: ?Past Surgical History:  ?Procedure Laterality Date  ? BREAST RECONSTRUCTION WITH PLACEMENT OF TISSUE EXPANDER AND ALLODERM Bilateral 10/08/2017  ? Procedure: BREAST RECONSTRUCTION WITH PLACEMENT OF TISSUE EXPANDER AND ALLODERM;  Surgeon: [name], MD;  Location: [place];  Service: Plastics;  Laterality: Bilateral;  ? CARDIAC CATHETERIZATION  08/26/2001  ? EF of 60% --- smooth & normal coronary arteries -- there is a Stephens motion defect consistent with posterolateral MI -- she quite possibly had a coronary spasm -- she may have some pericarditis secondary to her MI   ? [place] OF UTERUS  10/15/2003  ? Uterine enlargement, menorrhagia  ? DILATION AND CURETTAGE OF UTERUS  05/27/2002  ? Dysfunctional uterine bleeding  ? LAPAROSCOPY    ? LIPOSUCTION WITH LIPOFILLING Bilateral 01/17/2018  ? Procedure: LIPOFILLING FROM ABDOMEN TO CHEST;  Surgeon: [name], MD;  Location: [place];  Service: Plastics;  Laterality: Bilateral;  ? MASTECTOMY W/ SENTINEL NODE BIOPSY Bilateral 10/08/2017  ? Procedure: RIGHT MASTECTOMY WITH RIGHT AXILLARY SENTINEL LYMPH NODE BIOPSY AND LEFT PROPHYLACTIC MASTECTOMY;  Surgeon: [name], MD;  Location: [place];  Service: General;  Laterality: Bilateral;  ? NOVASURE ABLATION  10/15/2003  ? for management of her extreme menorrhagi  ? PORTACATH PLACEMENT Right 04/02/2017  ? Procedure: INSERTION PORT-A-CATH;  Surgeon: [name], MD;  Location: [facility];  Service: General;  Laterality: Right;  ? REMOVAL OF BILATERAL TISSUE EXPANDERS WITH PLACEMENT OF BILATERAL BREAST IMPLANTS Bilateral 01/17/2018  ? Procedure: REMOVAL OF BILATERAL TISSUE EXPANDERS WITH PLACEMENT OF BILATERAL SILICONE BREAST IMPLANTS;  Surgeon: [name], MD;  Location: [place];  Service: Plastics;  Laterality: Bilateral;   ? TUBAL LIGATION  09/2000  ? bilateral  ? ? ?FAMILY HISTORY ?Family History  ?Problem Relation Age of Onset  ? Hypertension Father   ? Diabetes Father   ? Other Father   ?     stomach mass suspicious for ca, never bx  ? Coronary artery disease Mother   ? Kidney cancer Mother 35  ? Coronary artery disease Maternal Grandfather   ? Colon cancer Maternal Grandfather 2  ?     recurrence  ? Coronary artery disease Maternal Uncle   ? Coronary artery disease Maternal Uncle   ? Diabetes Maternal Uncle   ? Colon cancer Maternal Grandmother 25  ? Breast cancer Cousin 26  ?     had GT- CHEK2+  ? Cervical cancer Maternal Aunt 35  ? Diabetes Maternal Aunt   ? Alzheimer's disease Paternal Grandmother   ? Colon cancer Paternal Grandfather 10  ? Breast cancer Paternal Aunt   ? Breast cancer Paternal Aunt   ? Breast cancer Other 45  ?Her father died 02/28/16 at age 55 just 46 week shy of his 83rd birthday. Her mother is 17 years old as of October 2018. Patient has one brother and no sisters. Her mother was diagnosed with kidney cancer at age 39 with a nephrectomy following. Her maternal grandmother was diagnosed with colon  cancer at age 29.  The patient paternal grandfather was diagnosed with colon cancer in his 40's. She has paternal cousins through her fathers half-sisters that have been diagnosed with breast cancer, she is unsure of the number of breast cancer occurrences due to the distance . A maternal cousin was diagnosed with breast cancer at age 95 and she had genetic testing. The patient does have a copy of this testing as well as her PCP.  This was not available for review on the nasal ? ? ?GYNECOLOGIC HISTORY:  ?No LMP recorded. Patient is postmenopausal.  ?Menarche: 56 years old ?Age at first live birth: 56 years old ?GP: GXP1  ?LMP: has had an endometrial ablation with residual vaginal spotting ?Contraceptive: BTL ?HRT: N/A  ? ? 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 Her husband Jennifer Beck is a Airline pilot. Her daughter [name] graduated from [employer] and currently works full-time at the

## 2021-09-26 ENCOUNTER — Encounter: Payer: Self-pay | Admitting: Oncology

## 2021-09-26 ENCOUNTER — Encounter: Payer: Self-pay | Admitting: Hematology and Oncology

## 2021-09-26 NOTE — Addendum Note (Signed)
Addended by: Adaline Sill on: 09/26/2021 05:57 PM ? ? Modules accepted: Orders ? ?

## 2021-09-29 DIAGNOSIS — Z6827 Body mass index (BMI) 27.0-27.9, adult: Secondary | ICD-10-CM | POA: Diagnosis not present

## 2021-09-29 DIAGNOSIS — Z124 Encounter for screening for malignant neoplasm of cervix: Secondary | ICD-10-CM | POA: Diagnosis not present

## 2021-09-29 DIAGNOSIS — Z01419 Encounter for gynecological examination (general) (routine) without abnormal findings: Secondary | ICD-10-CM | POA: Diagnosis not present

## 2021-09-29 DIAGNOSIS — N959 Unspecified menopausal and perimenopausal disorder: Secondary | ICD-10-CM | POA: Diagnosis not present

## 2021-10-18 DIAGNOSIS — M9904 Segmental and somatic dysfunction of sacral region: Secondary | ICD-10-CM | POA: Diagnosis not present

## 2021-10-18 DIAGNOSIS — M9902 Segmental and somatic dysfunction of thoracic region: Secondary | ICD-10-CM | POA: Diagnosis not present

## 2021-10-18 DIAGNOSIS — M9905 Segmental and somatic dysfunction of pelvic region: Secondary | ICD-10-CM | POA: Diagnosis not present

## 2021-10-18 DIAGNOSIS — M9903 Segmental and somatic dysfunction of lumbar region: Secondary | ICD-10-CM | POA: Diagnosis not present

## 2021-11-13 DIAGNOSIS — M9905 Segmental and somatic dysfunction of pelvic region: Secondary | ICD-10-CM | POA: Diagnosis not present

## 2021-11-13 DIAGNOSIS — M9902 Segmental and somatic dysfunction of thoracic region: Secondary | ICD-10-CM | POA: Diagnosis not present

## 2021-11-13 DIAGNOSIS — M9904 Segmental and somatic dysfunction of sacral region: Secondary | ICD-10-CM | POA: Diagnosis not present

## 2021-11-13 DIAGNOSIS — M9903 Segmental and somatic dysfunction of lumbar region: Secondary | ICD-10-CM | POA: Diagnosis not present

## 2021-11-21 DIAGNOSIS — H16223 Keratoconjunctivitis sicca, not specified as Sjogren's, bilateral: Secondary | ICD-10-CM | POA: Diagnosis not present

## 2021-11-21 DIAGNOSIS — H0288A Meibomian gland dysfunction right eye, upper and lower eyelids: Secondary | ICD-10-CM | POA: Diagnosis not present

## 2021-11-22 DIAGNOSIS — M9904 Segmental and somatic dysfunction of sacral region: Secondary | ICD-10-CM | POA: Diagnosis not present

## 2021-11-22 DIAGNOSIS — M9902 Segmental and somatic dysfunction of thoracic region: Secondary | ICD-10-CM | POA: Diagnosis not present

## 2021-11-22 DIAGNOSIS — M9903 Segmental and somatic dysfunction of lumbar region: Secondary | ICD-10-CM | POA: Diagnosis not present

## 2021-11-22 DIAGNOSIS — M9905 Segmental and somatic dysfunction of pelvic region: Secondary | ICD-10-CM | POA: Diagnosis not present

## 2021-12-21 DIAGNOSIS — I10 Essential (primary) hypertension: Secondary | ICD-10-CM | POA: Diagnosis not present

## 2021-12-21 DIAGNOSIS — E559 Vitamin D deficiency, unspecified: Secondary | ICD-10-CM | POA: Diagnosis not present

## 2021-12-21 DIAGNOSIS — M9903 Segmental and somatic dysfunction of lumbar region: Secondary | ICD-10-CM | POA: Diagnosis not present

## 2021-12-21 DIAGNOSIS — Z13220 Encounter for screening for lipoid disorders: Secondary | ICD-10-CM | POA: Diagnosis not present

## 2021-12-21 DIAGNOSIS — M9904 Segmental and somatic dysfunction of sacral region: Secondary | ICD-10-CM | POA: Diagnosis not present

## 2021-12-21 DIAGNOSIS — M9902 Segmental and somatic dysfunction of thoracic region: Secondary | ICD-10-CM | POA: Diagnosis not present

## 2021-12-21 DIAGNOSIS — Z0000 Encounter for general adult medical examination without abnormal findings: Secondary | ICD-10-CM | POA: Diagnosis not present

## 2021-12-21 DIAGNOSIS — M9905 Segmental and somatic dysfunction of pelvic region: Secondary | ICD-10-CM | POA: Diagnosis not present

## 2022-01-04 DIAGNOSIS — M9903 Segmental and somatic dysfunction of lumbar region: Secondary | ICD-10-CM | POA: Diagnosis not present

## 2022-01-04 DIAGNOSIS — M9905 Segmental and somatic dysfunction of pelvic region: Secondary | ICD-10-CM | POA: Diagnosis not present

## 2022-01-04 DIAGNOSIS — M9902 Segmental and somatic dysfunction of thoracic region: Secondary | ICD-10-CM | POA: Diagnosis not present

## 2022-01-04 DIAGNOSIS — M9904 Segmental and somatic dysfunction of sacral region: Secondary | ICD-10-CM | POA: Diagnosis not present

## 2022-01-10 DIAGNOSIS — K639 Disease of intestine, unspecified: Secondary | ICD-10-CM | POA: Diagnosis not present

## 2022-01-10 DIAGNOSIS — K2970 Gastritis, unspecified, without bleeding: Secondary | ICD-10-CM | POA: Diagnosis not present

## 2022-01-10 DIAGNOSIS — R1013 Epigastric pain: Secondary | ICD-10-CM | POA: Diagnosis not present

## 2022-01-11 DIAGNOSIS — Z853 Personal history of malignant neoplasm of breast: Secondary | ICD-10-CM | POA: Diagnosis not present

## 2022-01-11 DIAGNOSIS — Z9013 Acquired absence of bilateral breasts and nipples: Secondary | ICD-10-CM | POA: Diagnosis not present

## 2022-01-25 DIAGNOSIS — M9903 Segmental and somatic dysfunction of lumbar region: Secondary | ICD-10-CM | POA: Diagnosis not present

## 2022-01-25 DIAGNOSIS — M9904 Segmental and somatic dysfunction of sacral region: Secondary | ICD-10-CM | POA: Diagnosis not present

## 2022-01-25 DIAGNOSIS — M9905 Segmental and somatic dysfunction of pelvic region: Secondary | ICD-10-CM | POA: Diagnosis not present

## 2022-01-25 DIAGNOSIS — M9902 Segmental and somatic dysfunction of thoracic region: Secondary | ICD-10-CM | POA: Diagnosis not present

## 2022-02-21 DIAGNOSIS — K219 Gastro-esophageal reflux disease without esophagitis: Secondary | ICD-10-CM | POA: Diagnosis not present

## 2022-02-21 DIAGNOSIS — K639 Disease of intestine, unspecified: Secondary | ICD-10-CM | POA: Diagnosis not present

## 2022-02-22 DIAGNOSIS — M9902 Segmental and somatic dysfunction of thoracic region: Secondary | ICD-10-CM | POA: Diagnosis not present

## 2022-02-22 DIAGNOSIS — M9904 Segmental and somatic dysfunction of sacral region: Secondary | ICD-10-CM | POA: Diagnosis not present

## 2022-02-22 DIAGNOSIS — M9905 Segmental and somatic dysfunction of pelvic region: Secondary | ICD-10-CM | POA: Diagnosis not present

## 2022-02-22 DIAGNOSIS — M9903 Segmental and somatic dysfunction of lumbar region: Secondary | ICD-10-CM | POA: Diagnosis not present

## 2022-03-28 DIAGNOSIS — R0981 Nasal congestion: Secondary | ICD-10-CM | POA: Diagnosis not present

## 2022-03-28 DIAGNOSIS — R5383 Other fatigue: Secondary | ICD-10-CM | POA: Diagnosis not present

## 2022-03-28 DIAGNOSIS — M791 Myalgia, unspecified site: Secondary | ICD-10-CM | POA: Diagnosis not present

## 2022-03-28 DIAGNOSIS — H66002 Acute suppurative otitis media without spontaneous rupture of ear drum, left ear: Secondary | ICD-10-CM | POA: Diagnosis not present

## 2022-03-29 DIAGNOSIS — M9902 Segmental and somatic dysfunction of thoracic region: Secondary | ICD-10-CM | POA: Diagnosis not present

## 2022-03-29 DIAGNOSIS — M9905 Segmental and somatic dysfunction of pelvic region: Secondary | ICD-10-CM | POA: Diagnosis not present

## 2022-03-29 DIAGNOSIS — M9904 Segmental and somatic dysfunction of sacral region: Secondary | ICD-10-CM | POA: Diagnosis not present

## 2022-03-29 DIAGNOSIS — M9903 Segmental and somatic dysfunction of lumbar region: Secondary | ICD-10-CM | POA: Diagnosis not present

## 2022-03-30 DIAGNOSIS — H0288B Meibomian gland dysfunction left eye, upper and lower eyelids: Secondary | ICD-10-CM | POA: Diagnosis not present

## 2022-03-30 DIAGNOSIS — H16223 Keratoconjunctivitis sicca, not specified as Sjogren's, bilateral: Secondary | ICD-10-CM | POA: Diagnosis not present

## 2022-03-30 DIAGNOSIS — H0288A Meibomian gland dysfunction right eye, upper and lower eyelids: Secondary | ICD-10-CM | POA: Diagnosis not present

## 2022-04-04 DIAGNOSIS — R871 Abnormal level of hormones in specimens from female genital organs: Secondary | ICD-10-CM | POA: Diagnosis not present

## 2022-04-04 DIAGNOSIS — N959 Unspecified menopausal and perimenopausal disorder: Secondary | ICD-10-CM | POA: Diagnosis not present

## 2022-04-05 DIAGNOSIS — D485 Neoplasm of uncertain behavior of skin: Secondary | ICD-10-CM | POA: Diagnosis not present

## 2022-04-05 DIAGNOSIS — L821 Other seborrheic keratosis: Secondary | ICD-10-CM | POA: Diagnosis not present

## 2022-04-05 DIAGNOSIS — L820 Inflamed seborrheic keratosis: Secondary | ICD-10-CM | POA: Diagnosis not present

## 2022-04-05 DIAGNOSIS — L814 Other melanin hyperpigmentation: Secondary | ICD-10-CM | POA: Diagnosis not present

## 2022-04-05 DIAGNOSIS — Z85828 Personal history of other malignant neoplasm of skin: Secondary | ICD-10-CM | POA: Diagnosis not present

## 2022-04-05 DIAGNOSIS — D2261 Melanocytic nevi of right upper limb, including shoulder: Secondary | ICD-10-CM | POA: Diagnosis not present

## 2022-04-10 DIAGNOSIS — Z23 Encounter for immunization: Secondary | ICD-10-CM | POA: Diagnosis not present

## 2022-04-23 DIAGNOSIS — R6882 Decreased libido: Secondary | ICD-10-CM | POA: Diagnosis not present

## 2022-04-27 DIAGNOSIS — M9904 Segmental and somatic dysfunction of sacral region: Secondary | ICD-10-CM | POA: Diagnosis not present

## 2022-04-27 DIAGNOSIS — M9905 Segmental and somatic dysfunction of pelvic region: Secondary | ICD-10-CM | POA: Diagnosis not present

## 2022-04-27 DIAGNOSIS — M9902 Segmental and somatic dysfunction of thoracic region: Secondary | ICD-10-CM | POA: Diagnosis not present

## 2022-04-27 DIAGNOSIS — M9903 Segmental and somatic dysfunction of lumbar region: Secondary | ICD-10-CM | POA: Diagnosis not present

## 2022-05-22 DIAGNOSIS — R6882 Decreased libido: Secondary | ICD-10-CM | POA: Diagnosis not present

## 2022-05-23 DIAGNOSIS — M9902 Segmental and somatic dysfunction of thoracic region: Secondary | ICD-10-CM | POA: Diagnosis not present

## 2022-05-23 DIAGNOSIS — M9905 Segmental and somatic dysfunction of pelvic region: Secondary | ICD-10-CM | POA: Diagnosis not present

## 2022-05-23 DIAGNOSIS — M9903 Segmental and somatic dysfunction of lumbar region: Secondary | ICD-10-CM | POA: Diagnosis not present

## 2022-05-23 DIAGNOSIS — M9904 Segmental and somatic dysfunction of sacral region: Secondary | ICD-10-CM | POA: Diagnosis not present

## 2022-06-19 DIAGNOSIS — R6882 Decreased libido: Secondary | ICD-10-CM | POA: Diagnosis not present

## 2022-06-21 DIAGNOSIS — M9902 Segmental and somatic dysfunction of thoracic region: Secondary | ICD-10-CM | POA: Diagnosis not present

## 2022-06-21 DIAGNOSIS — M9903 Segmental and somatic dysfunction of lumbar region: Secondary | ICD-10-CM | POA: Diagnosis not present

## 2022-06-21 DIAGNOSIS — M9904 Segmental and somatic dysfunction of sacral region: Secondary | ICD-10-CM | POA: Diagnosis not present

## 2022-06-21 DIAGNOSIS — M9905 Segmental and somatic dysfunction of pelvic region: Secondary | ICD-10-CM | POA: Diagnosis not present

## 2022-06-23 DIAGNOSIS — Z6826 Body mass index (BMI) 26.0-26.9, adult: Secondary | ICD-10-CM | POA: Diagnosis not present

## 2022-06-23 DIAGNOSIS — J0110 Acute frontal sinusitis, unspecified: Secondary | ICD-10-CM | POA: Diagnosis not present

## 2022-07-03 DIAGNOSIS — R591 Generalized enlarged lymph nodes: Secondary | ICD-10-CM | POA: Diagnosis not present

## 2022-07-03 DIAGNOSIS — R891 Abnormal level of hormones in specimens from other organs, systems and tissues: Secondary | ICD-10-CM | POA: Diagnosis not present

## 2022-07-16 DIAGNOSIS — M9902 Segmental and somatic dysfunction of thoracic region: Secondary | ICD-10-CM | POA: Diagnosis not present

## 2022-07-16 DIAGNOSIS — M9904 Segmental and somatic dysfunction of sacral region: Secondary | ICD-10-CM | POA: Diagnosis not present

## 2022-07-16 DIAGNOSIS — M9903 Segmental and somatic dysfunction of lumbar region: Secondary | ICD-10-CM | POA: Diagnosis not present

## 2022-07-16 DIAGNOSIS — M9905 Segmental and somatic dysfunction of pelvic region: Secondary | ICD-10-CM | POA: Diagnosis not present

## 2022-08-23 DIAGNOSIS — M9905 Segmental and somatic dysfunction of pelvic region: Secondary | ICD-10-CM | POA: Diagnosis not present

## 2022-08-23 DIAGNOSIS — M9903 Segmental and somatic dysfunction of lumbar region: Secondary | ICD-10-CM | POA: Diagnosis not present

## 2022-08-23 DIAGNOSIS — M9902 Segmental and somatic dysfunction of thoracic region: Secondary | ICD-10-CM | POA: Diagnosis not present

## 2022-08-23 DIAGNOSIS — M9904 Segmental and somatic dysfunction of sacral region: Secondary | ICD-10-CM | POA: Diagnosis not present

## 2022-09-03 DIAGNOSIS — R6882 Decreased libido: Secondary | ICD-10-CM | POA: Diagnosis not present

## 2022-09-20 DIAGNOSIS — M9902 Segmental and somatic dysfunction of thoracic region: Secondary | ICD-10-CM | POA: Diagnosis not present

## 2022-09-20 DIAGNOSIS — M9903 Segmental and somatic dysfunction of lumbar region: Secondary | ICD-10-CM | POA: Diagnosis not present

## 2022-09-20 DIAGNOSIS — M9905 Segmental and somatic dysfunction of pelvic region: Secondary | ICD-10-CM | POA: Diagnosis not present

## 2022-09-20 DIAGNOSIS — M9904 Segmental and somatic dysfunction of sacral region: Secondary | ICD-10-CM | POA: Diagnosis not present

## 2022-09-21 DIAGNOSIS — L570 Actinic keratosis: Secondary | ICD-10-CM | POA: Diagnosis not present

## 2022-09-25 ENCOUNTER — Other Ambulatory Visit: Payer: Self-pay | Admitting: *Deleted

## 2022-09-25 DIAGNOSIS — C50411 Malignant neoplasm of upper-outer quadrant of right female breast: Secondary | ICD-10-CM

## 2022-09-26 ENCOUNTER — Other Ambulatory Visit: Payer: BC Managed Care – PPO

## 2022-09-27 ENCOUNTER — Inpatient Hospital Stay: Payer: BC Managed Care – PPO

## 2022-09-27 ENCOUNTER — Encounter: Payer: Self-pay | Admitting: Oncology

## 2022-09-27 ENCOUNTER — Inpatient Hospital Stay: Payer: BC Managed Care – PPO | Attending: Hematology and Oncology | Admitting: Hematology and Oncology

## 2022-09-27 VITALS — BP 115/57 | HR 74 | Temp 97.7°F | Resp 14 | Wt 164.6 lb

## 2022-09-27 DIAGNOSIS — R5383 Other fatigue: Secondary | ICD-10-CM | POA: Diagnosis not present

## 2022-09-27 DIAGNOSIS — Z171 Estrogen receptor negative status [ER-]: Secondary | ICD-10-CM | POA: Insufficient documentation

## 2022-09-27 DIAGNOSIS — Z85828 Personal history of other malignant neoplasm of skin: Secondary | ICD-10-CM | POA: Insufficient documentation

## 2022-09-27 DIAGNOSIS — Z8679 Personal history of other diseases of the circulatory system: Secondary | ICD-10-CM | POA: Diagnosis not present

## 2022-09-27 DIAGNOSIS — Z803 Family history of malignant neoplasm of breast: Secondary | ICD-10-CM | POA: Diagnosis not present

## 2022-09-27 DIAGNOSIS — Z8249 Family history of ischemic heart disease and other diseases of the circulatory system: Secondary | ICD-10-CM | POA: Diagnosis not present

## 2022-09-27 DIAGNOSIS — Z833 Family history of diabetes mellitus: Secondary | ICD-10-CM | POA: Insufficient documentation

## 2022-09-27 DIAGNOSIS — Z9013 Acquired absence of bilateral breasts and nipples: Secondary | ICD-10-CM | POA: Diagnosis not present

## 2022-09-27 DIAGNOSIS — Z8041 Family history of malignant neoplasm of ovary: Secondary | ICD-10-CM | POA: Insufficient documentation

## 2022-09-27 DIAGNOSIS — Z800 Family history of malignant neoplasm of digestive organs: Secondary | ICD-10-CM | POA: Insufficient documentation

## 2022-09-27 DIAGNOSIS — Z885 Allergy status to narcotic agent status: Secondary | ICD-10-CM | POA: Insufficient documentation

## 2022-09-27 DIAGNOSIS — C50411 Malignant neoplasm of upper-outer quadrant of right female breast: Secondary | ICD-10-CM | POA: Diagnosis not present

## 2022-09-27 DIAGNOSIS — Z79899 Other long term (current) drug therapy: Secondary | ICD-10-CM | POA: Insufficient documentation

## 2022-09-27 DIAGNOSIS — Z8379 Family history of other diseases of the digestive system: Secondary | ICD-10-CM | POA: Diagnosis not present

## 2022-09-27 DIAGNOSIS — C50211 Malignant neoplasm of upper-inner quadrant of right female breast: Secondary | ICD-10-CM | POA: Insufficient documentation

## 2022-09-27 DIAGNOSIS — M2550 Pain in unspecified joint: Secondary | ICD-10-CM | POA: Diagnosis not present

## 2022-09-27 LAB — CMP (CANCER CENTER ONLY)
ALT: 17 U/L (ref 0–44)
AST: 17 U/L (ref 15–41)
Albumin: 4.3 g/dL (ref 3.5–5.0)
Alkaline Phosphatase: 53 U/L (ref 38–126)
Anion gap: 6 (ref 5–15)
BUN: 14 mg/dL (ref 6–20)
CO2: 27 mmol/L (ref 22–32)
Calcium: 8.9 mg/dL (ref 8.9–10.3)
Chloride: 105 mmol/L (ref 98–111)
Creatinine: 0.79 mg/dL (ref 0.44–1.00)
GFR, Estimated: >60 mL/min (ref >60)
Glucose, Bld: 95 mg/dL (ref 70–99)
Potassium: 4.2 mmol/L (ref 3.5–5.1)
Sodium: 138 mmol/L (ref 135–145)
Total Bilirubin: 0.3 mg/dL (ref 0.3–1.2)
Total Protein: 7.1 g/dL (ref 6.5–8.1)

## 2022-09-27 LAB — CBC WITH DIFFERENTIAL (CANCER CENTER ONLY)
Abs Immature Granulocytes: 0.04 10*3/uL (ref 0.00–0.07)
Basophils Absolute: 0 10*3/uL (ref 0.0–0.1)
Basophils Relative: 1 %
Eosinophils Absolute: 0.3 10*3/uL (ref 0.0–0.5)
Eosinophils Relative: 5 %
HCT: 40.5 % (ref 36.0–46.0)
Hemoglobin: 14.1 g/dL (ref 12.0–15.0)
Immature Granulocytes: 1 %
Lymphocytes Relative: 26 %
Lymphs Abs: 1.7 10*3/uL (ref 0.7–4.0)
MCH: 33.4 pg (ref 26.0–34.0)
MCHC: 34.8 g/dL (ref 30.0–36.0)
MCV: 96 fL (ref 80.0–100.0)
Monocytes Absolute: 0.5 10*3/uL (ref 0.1–1.0)
Monocytes Relative: 8 %
Neutro Abs: 3.8 10*3/uL (ref 1.7–7.7)
Neutrophils Relative %: 59 %
Platelet Count: 321 10*3/uL (ref 150–400)
RBC: 4.22 MIL/uL (ref 3.87–5.11)
RDW: 12 % (ref 11.5–15.5)
WBC Count: 6.4 10*3/uL (ref 4.0–10.5)
nRBC: 0 % (ref 0.0–0.2)

## 2022-09-27 NOTE — Progress Notes (Signed)
[facility]  [phone] [phone]     ID: Jennifer Beck Natal DOB: 01-07-1966  MR#: [id number]  JYN#:[id number]  Patient Care Team: Marcelle Overlie, MD as PCP - General (Obstetrics and Gynecology) Aris Lot, MD as Consulting Physician (Dermatology) Wylie Hail, DC as Referring Physician (Chiropractic Medicine) Lonie Peak, MD as Attending Physician (Radiation Oncology) Glenna Fellows, MD as Consulting Physician (Plastic Surgery) Glendale Chard, DO as Consulting Physician (Neurology) Dominica Severin, MD as Consulting Physician (Orthopedic Surgery) OTHER MD:  CHIEF COMPLAINT: Triple negative breast cancer (s/p bilateral mastectomies)  CURRENT TREATMENT: Observation    INTERVAL HISTORY: Jennifer Beck returns today for follow-up of her triple negative breast cancer. She continues under observation.  She is now on estrogen 1 mg PO BID, progesterone 200 QHS. She gets testosterone injections once a month. She has noticed some improvement in fatigue, joint pains. She denies any new health complaints.  No new breast changes.  No change in breathing, bowel habits or urinary habits.  No change in implants. Rest of the pertinent 10 point ROS reviewed and negative.    COVID 19 VACCINATION STATUS: Never vaccinated; had COVID April 2022   HISTORY OF CURRENT ILLNESS:  From the original intake note:  Jennifer Beck palpated a mass in her right breast sometime in September 2018.[name] She brought this to medical attention and her PCP, Dr. Carita Pian, set her up on 03/18/2017 for a unilateral right diagnostic mammography with ultrasonography, showing: Breast density D. The palpable right breast mass at 12:30 was indeterminate, and biopsy was warranted at that time. No evidence of right axillary lymphadenopathy was noted. Biopsy of the lesion in question on 03/21/2017 at the right breast 12:30 position showed ([id number])  invasive ductal carcinoma with  lymphovascular invasion.  The tumor was grade 3, triple negative, with an MIB-1 of 80%.  She is accompanied by her husband, Jennifer Beck to the office today. She reports that she is doing well overall. She initially felt a lump in September in the shower and notes that she doesn't complete monthly self breast exams. She had a routine mammogram in March 2018 at [facility] and she is followed by Dr. Varney Baas.  As far as surgeries, she has had two laparoscopic procedures for endometriosis as well as a bilateral tubal ligation. She still has occasional cramping, painful ovulation, and vaginal spotting that comes and goes. She has had cyst to her breast before that have been evaluated and ruled as negative. She notes that she still has her tonsils, gallbladder, and appendix. She has a prior hx of pericarditis in 2003 that she notes was brought onby a virus. She has since been cleared by her prior Cardiologist.   She has a Land, Dr. Clide Dales that she sees every 5 weeks for maintenance of her neck and back issues. She has a dermatologist, Dr. Doreen Beam at [facility] and she has had an biopsy of an area from her left chest that resulted as basal cell carcinoma in 2015. She denies having a GI specialist, Cardiologist, or Orthopedist at this time. She denies prior history of seizures, migraines, acid reflux, asthma, emphysema, palpitations or GI issues.   [name] is as detailed below.   PAST MEDICAL HISTORY: Past Medical History:  Diagnosis Date   Arthritis    leg and muscle pains   Chest pain    Complication of anesthesia    Endometriosis    Family history of breast cancer 04/22/2017   Family history  of colon cancer 04/22/2017   Family history of kidney cancer 04/22/2017   Fatigue    GERD (gastroesophageal reflux disease)    GI bleed    caused by ischemic colitis following an episode of hypertension   Heart palpitations    Hx of echocardiogram     a. echo 9/13:  EF 55-60%, Gr 2 diast dysfn   Hypercholesterolemia    Ischemic colitis (HCC)    Myocarditis (HCC)    Pericarditis    PONV (postoperative nausea and vomiting)    Restrictive pericarditis 2004   SOB (shortness of breath)     PAST SURGICAL HISTORY: Past Surgical History:  Procedure Laterality Date   BREAST RECONSTRUCTION WITH PLACEMENT OF TISSUE EXPANDER AND ALLODERM Bilateral 10/08/2017   Procedure: BREAST RECONSTRUCTION WITH PLACEMENT OF TISSUE EXPANDER AND ALLODERM;  Surgeon: Glenna Fellows, MD;  Location: [facility];  Service: Plastics;  Laterality: Bilateral;   CARDIAC CATHETERIZATION  08/26/2001   EF of 60% --- smooth & normal coronary arteries -- there is a Weygandt motion defect consistent with posterolateral MI -- she quite possibly had a coronary spasm -- she may have some pericarditis secondary to her MI    DILATION AND CURETTAGE OF UTERUS  10/15/2003   Uterine enlargement, menorrhagia   DILATION AND CURETTAGE OF UTERUS  05/27/2002   Dysfunctional uterine bleeding   LAPAROSCOPY     LIPOSUCTION WITH LIPOFILLING Bilateral 01/17/2018   Procedure: LIPOFILLING FROM ABDOMEN TO CHEST;  Surgeon: Glenna Fellows, MD;  Location: [facility];  Service: Plastics;  Laterality: Bilateral;   MASTECTOMY W/ SENTINEL NODE BIOPSY Bilateral 10/08/2017   Procedure: RIGHT MASTECTOMY WITH RIGHT AXILLARY SENTINEL LYMPH NODE BIOPSY AND LEFT PROPHYLACTIC MASTECTOMY;  Surgeon: Ovidio Kin, MD;  Location: [facility];  Service: General;  Laterality: Bilateral;   NOVASURE ABLATION  10/15/2003   for management of her extreme menorrhagi   PORTACATH PLACEMENT Right 04/02/2017   Procedure: INSERTION PORT-A-CATH;  Surgeon: Ovidio Kin, MD;  Location: [facility];  Service: General;  Laterality: Right;   REMOVAL OF BILATERAL TISSUE EXPANDERS WITH PLACEMENT OF BILATERAL BREAST IMPLANTS Bilateral 01/17/2018   Procedure: REMOVAL OF BILATERAL TISSUE EXPANDERS  WITH PLACEMENT OF BILATERAL SILICONE BREAST IMPLANTS;  Surgeon: Glenna Fellows, MD;  Location: [facility];  Service: Plastics;  Laterality: Bilateral;   TUBAL LIGATION  09/2000   bilateral    FAMILY HISTORY Family History  Problem Relation Age of Onset   Hypertension Father    Diabetes Father    Other Father        stomach mass suspicious for ca, never bx   Coronary artery disease Mother    Kidney cancer Mother 61   Coronary artery disease Maternal Grandfather    Colon cancer Maternal Grandfather 61       recurrence   Coronary artery disease Maternal Uncle    Coronary artery disease Maternal Uncle    Diabetes Maternal Uncle    Colon cancer Maternal Grandmother 36   Breast cancer Cousin 45       had GT- CHEK2+   Cervical cancer Maternal Aunt 35   Diabetes Maternal Aunt    Alzheimer's disease Paternal Grandmother    Colon cancer Paternal Grandfather 67   Breast cancer Paternal Aunt    Breast cancer Paternal Aunt    Breast cancer Other 45  Her father died September 27, 2017at age 75 just 3 week shy of his 83rd birthday. Her mother is 64 years old as of  October 2018. Patient has one brother and no sisters. Her mother was diagnosed with kidney cancer at age 76 with a nephrectomy following. Her maternal grandmother was diagnosed with colon cancer at age 29.  The patient paternal grandfather was diagnosed with colon cancer in his 55's. She has paternal cousins through her fathers half-sisters that have been diagnosed with breast cancer, she is unsure of the number of breast cancer occurrences due to the distance . A maternal cousin was diagnosed with breast cancer at age 70 and she had genetic testing. The patient does have a copy of this testing as well as her PCP.  This was not available for review on the nasal   GYNECOLOGIC HISTORY:  No LMP recorded. Patient is postmenopausal.  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 Her husband Jennifer Beck is a IT sales professional. Her daughter Dahlia Client graduated from [employer] studying Zooology and currently works full-time at the [employer].  She commutes from home.  The patient attends [employer]. She lives in [place].     ADVANCED DIRECTIVES: In the absence of any documentation to the contrary, the patient's spouse is their HCPOA.    HEALTH MAINTENANCE: Social History   Tobacco Use   Smoking status: Never   Smokeless tobacco: Never  Vaping Use   Vaping Use: Never used  Substance Use Topics   Alcohol use: Yes    Comment: Occasionally drinks wine   Drug use: No     Colonoscopy: December 2019 (Outlaw)  PAP: March 2018   Bone density: N/A   Allergies  Allergen Reactions   Codeine Itching and Other (See Comments)   Biaxin [Clarithromycin]     Heart Burn   Egg-Derived Products Diarrhea    Current Outpatient Medications  Medication Sig Dispense Refill   ALPRAZolam (XANAX) 0.25 MG tablet Take 0.25 mg by mouth daily at 10 pm. Only at bedtime as need     Carboxymeth-Glyc-Polysorb PF (REFRESH OPTIVE MEGA-3) 0.5-1-0.5 % SOLN      Cholecalciferol (VITAMIN D) 2000 units CAPS Take 2,000 Units by mouth daily.     estradiol (ESTRACE) 1 MG tablet Take 1.5 tablets (1.5 mg total) by mouth daily. 45 tablet 3   famotidine (PEPCID) 20 MG tablet 1 tablet     Multiple Minerals-Vitamins (CALCIUM-MAGNESIUM-ZINC) TABS Take 1 tablet by mouth daily.      Multiple Vitamins-Minerals (MULTIVITAMINS THER. W/MINERALS) TABS Take 1 tablet by mouth daily.      Omega-3 Fatty Acids (FISH OIL) 1200 MG CAPS 1 capsule     Probiotic Product (PROBIOTIC BLEND PO) 1 capsule     progesterone (PROMETRIUM) 200 MG capsule TAKE 1 CAPSULE(200 MG) BY MOUTH  DAILY 30 capsule 0   Testosterone 1.62 % GEL Place 5 mg onto the skin 2 (two) times daily. 30 g 3   No current facility-administered medications for this visit.    OBJECTIVE: white woman who appears well Vitals:   09/27/22 0835  BP: (!) 115/57  Pulse: 74  Resp: 14  Temp: 97.7 F (36.5 C)  SpO2: 100%      Body mass index is 27.39 kg/m.   Wt Readings from Last 3 Encounters:  09/27/22  164 lb 9.6 oz (74.7 kg)  09/25/21 163 lb 3.2 oz (74 kg)  12/20/20 157 lb (71.2 kg)   ECOG FS:1  Sclerae unicteric, EOMs intact Wearing a mask No cervical or supraclavicular adenopathy MSK no focal spinal tenderness, no upper extremity lymphedema Neuro: nonfocal, well oriented, appropriate affect Breasts: Status post bilateral mastectomies with bilateral silicone implant reconstruction.  There is no evidence of chest Whipple recurrence.  Both axillae are benign.   LAB RESULTS:  CMP     Component Value Date/Time   NA 138 09/27/2022 0754   NA 137 05/30/2017 1219   K 4.2 09/27/2022 0754   K 3.8 05/30/2017 1219   CL 105 09/27/2022 0754   CO2 27 09/27/2022 0754   CO2 25 05/30/2017 1219   GLUCOSE 95 09/27/2022 0754   GLUCOSE 122 05/30/2017 1219   BUN 14 09/27/2022 0754   BUN 8.9 05/30/2017 1219   CREATININE 0.79 09/27/2022 0754   CREATININE 0.62 08/03/2020 0000   CREATININE 0.7 05/30/2017 1219   CALCIUM 8.9 09/27/2022 0754   CALCIUM 9.4 05/30/2017 1219   PROT 7.1 09/27/2022 0754   PROT 6.4 05/30/2017 1219   ALBUMIN 4.3 09/27/2022 0754   ALBUMIN 3.7 05/30/2017 1219   AST 17 09/27/2022 0754   AST 12 05/30/2017 1219   ALT 17 09/27/2022 0754   ALT 14 05/30/2017 1219   ALKPHOS 53 09/27/2022 0754   ALKPHOS 137 05/30/2017 1219   BILITOT 0.3 09/27/2022 0754   BILITOT 0.38 05/30/2017 1219   GFRNONAA >60 09/27/2022 0754   GFRNONAA 104 08/03/2020 0000   GFRAA 120 08/03/2020 0000    Lab Results  Component Value Date   WBC 6.4 09/27/2022   NEUTROABS 3.8 09/27/2022   HGB 14.1 09/27/2022    HCT 40.5 09/27/2022   MCV 96.0 09/27/2022   PLT 321 09/27/2022   No results found for: "LABCA2"  No components found for: "YNWGNF621"  No results for input(s): "INR" in the last 168 hours.  No results found for: "LABCA2"  No results found for: "HYQ657"  No results found for: "CAN125"  No results found for: "CAN153"  No results found for: "CA2729"  No components found for: "HGQUANT"  No results found for: "CEA1", "CEA" / No results found for: "CEA1", "CEA"   No results found for: "AFPTUMOR"  No results found for: "CHROMOGRNA"  No results found for: "TOTALPROTELP", "ALBUMINELP", "A1GS", "A2GS", "BETS", "BETA2SER", "GAMS", "MSPIKE", "SPEI" (this displays SPEP labs)  No results found for: "KPAFRELGTCHN", "LAMBDASER", "KAPLAMBRATIO" (kappa/lambda light chains)  No results found for: "HGBA", "HGBA2QUANT", "HGBFQUANT", "HGBSQUAN" (Hemoglobinopathy evaluation)   No results found for: "LDH"  No results found for: "IRON", "TIBC", "IRONPCTSAT" (Iron and TIBC)  No results found for: "FERRITIN"  Urinalysis No results found for: "COLORURINE", "APPEARANCEUR", "LABSPEC", "PHURINE", "GLUCOSEU", "HGBUR", "BILIRUBINUR", "KETONESUR", "PROTEINUR", "UROBILINOGEN", "NITRITE", "LEUKOCYTESUR"   STUDIES: No results found.   ELIGIBLE FOR AVAILABLE RESEARCH PROTOCOL: Enrolled in cancer disparities study   ASSESSMENT: 57 y.o. [place] woman status post right breast upper inner quadrant biopsy March 21, 2017 for a clinical T2 N0  Stage IIB invasive ductal carcinoma, grade 3, triple negative, with an MIB-1 of 80%.  (a) biopsy of 2 additional suspicious areas in the right breast and one in the left breast 04/11/2017 showed atypical lobular hyperplasia and sclerosing adenosis but no evidence of cancer  (1) neoadjuvant chemotherapy consisting of doxorubicin and cyclophosphamide in dose dense fashion x4 completed 05/23/2017, followed by paclitaxel/carboplatin weekly x12, first dose  06/06/2017, completed 08/29/2017  (  2) status post bilateral mastectomies 10/08/2017, showing  (a) left breast, atypical lobular hyperplasia, no evidence of malignancy  (b) right breast, residual  pT1b pN0 invasive ductal carcinoma, grade 3, with negative margins.  A total of 2 sentinel lymph nodes were removed; repeat prognostic panel requested 10/18/2017  (c) immediate expander reconstruction with silicone implants on 01/17/2018  (3) adjuvant radiation not indicated  (4) genetics testing 04/22/2017 through the common hereditary cancer panel plus renal/urinary tract cancel panel showed no deleterious mutations in APC, ATM, AXIN2, BAP1, BARD1, BMPR1A, BRCA1, BRCA2, BRIP1, BUB1B, CDC73, CDH1, CDK4, CDKN1C, CDKN2A (p14ARF), CDKN2A (p16INK4a), CEP57, CHEK2, CTNNA1, DICER1, DIS3L2, EPCAM*, FH, FLCN, GPC3, GREM1*, KIT, MEN1, MET, MLH1,MSH2, MSH3, MSH6, MUTYH, NBN, NF1, PALB2, PDGFRA, PMS2, POLD1, POLE, PTEN, RAD50, RAD51C, RAD51D, SDHB,SDHC, SDHD, SMAD4, SMARCA4, SMARCB1, STK11, TP53, TSC1, TSC2, VHL, WT1.The following genes were evaluated for sequence changes only: HOXB13*, MITF*, NTHL1*, SDHA  (a) there was a Variant of Uncertain Significance identified in POLD1, namely c.2953C>T (p.Arg985Trp)  (5) declined participation in S 1418 due to concerns regarding side effects   PLAN:  Ms. Bang is here for follow-up.  She has been doing quite well and has no concerns for breast cancer recurrence. Physical examination today without any change in implants or new nodules or regional adenopathy concerning for recurrence. She is now post 5 years since completion of chemotherapy. Encouraged healthy lifestyle, regular exercise and to report any new s concerning symptoms. She will otherwise return to clinic as needed.  She is very comfortable with this plan.  I also offered her a follow-up in survivorship clinic if she decides to come back to the oncology clinic once a year.  She expressed understanding.   Age-appropriate cancer screening recommended. Total time spent: 20 minutes *Total Encounter Time as defined by the Centers for Medicare and Medicaid Services includes, in addition to the face-to-face time of a patient visit (documented in the note above) non-face-to-face time: obtaining and reviewing outside history, ordering and reviewing medications, tests or procedures, care coordination (communications with other health care professionals or caregivers) and documentation in the medical record.

## 2022-10-01 DIAGNOSIS — R6882 Decreased libido: Secondary | ICD-10-CM | POA: Diagnosis not present

## 2022-10-08 DIAGNOSIS — Z6826 Body mass index (BMI) 26.0-26.9, adult: Secondary | ICD-10-CM | POA: Diagnosis not present

## 2022-10-08 DIAGNOSIS — J309 Allergic rhinitis, unspecified: Secondary | ICD-10-CM | POA: Diagnosis not present

## 2022-10-08 DIAGNOSIS — R051 Acute cough: Secondary | ICD-10-CM | POA: Diagnosis not present

## 2022-10-16 DIAGNOSIS — M9904 Segmental and somatic dysfunction of sacral region: Secondary | ICD-10-CM | POA: Diagnosis not present

## 2022-10-16 DIAGNOSIS — M9902 Segmental and somatic dysfunction of thoracic region: Secondary | ICD-10-CM | POA: Diagnosis not present

## 2022-10-16 DIAGNOSIS — M9905 Segmental and somatic dysfunction of pelvic region: Secondary | ICD-10-CM | POA: Diagnosis not present

## 2022-10-16 DIAGNOSIS — M9903 Segmental and somatic dysfunction of lumbar region: Secondary | ICD-10-CM | POA: Diagnosis not present

## 2022-10-23 DIAGNOSIS — Z124 Encounter for screening for malignant neoplasm of cervix: Secondary | ICD-10-CM | POA: Diagnosis not present

## 2022-10-23 DIAGNOSIS — Z6827 Body mass index (BMI) 27.0-27.9, adult: Secondary | ICD-10-CM | POA: Diagnosis not present

## 2022-10-23 DIAGNOSIS — N951 Menopausal and female climacteric states: Secondary | ICD-10-CM | POA: Diagnosis not present

## 2022-10-23 DIAGNOSIS — Z01419 Encounter for gynecological examination (general) (routine) without abnormal findings: Secondary | ICD-10-CM | POA: Diagnosis not present

## 2022-10-31 DIAGNOSIS — Z853 Personal history of malignant neoplasm of breast: Secondary | ICD-10-CM | POA: Diagnosis not present

## 2022-10-31 DIAGNOSIS — R6882 Decreased libido: Secondary | ICD-10-CM | POA: Diagnosis not present

## 2022-11-14 DIAGNOSIS — M9902 Segmental and somatic dysfunction of thoracic region: Secondary | ICD-10-CM | POA: Diagnosis not present

## 2022-11-14 DIAGNOSIS — M9903 Segmental and somatic dysfunction of lumbar region: Secondary | ICD-10-CM | POA: Diagnosis not present

## 2022-11-14 DIAGNOSIS — M9905 Segmental and somatic dysfunction of pelvic region: Secondary | ICD-10-CM | POA: Diagnosis not present

## 2022-11-14 DIAGNOSIS — M9904 Segmental and somatic dysfunction of sacral region: Secondary | ICD-10-CM | POA: Diagnosis not present

## 2022-11-28 DIAGNOSIS — R6882 Decreased libido: Secondary | ICD-10-CM | POA: Diagnosis not present

## 2022-12-13 DIAGNOSIS — M9903 Segmental and somatic dysfunction of lumbar region: Secondary | ICD-10-CM | POA: Diagnosis not present

## 2022-12-13 DIAGNOSIS — M9905 Segmental and somatic dysfunction of pelvic region: Secondary | ICD-10-CM | POA: Diagnosis not present

## 2022-12-13 DIAGNOSIS — M9902 Segmental and somatic dysfunction of thoracic region: Secondary | ICD-10-CM | POA: Diagnosis not present

## 2022-12-13 DIAGNOSIS — M9904 Segmental and somatic dysfunction of sacral region: Secondary | ICD-10-CM | POA: Diagnosis not present

## 2022-12-27 DIAGNOSIS — R6882 Decreased libido: Secondary | ICD-10-CM | POA: Diagnosis not present

## 2023-01-03 DIAGNOSIS — I10 Essential (primary) hypertension: Secondary | ICD-10-CM | POA: Diagnosis not present

## 2023-01-03 DIAGNOSIS — Z131 Encounter for screening for diabetes mellitus: Secondary | ICD-10-CM | POA: Diagnosis not present

## 2023-01-03 DIAGNOSIS — Z0000 Encounter for general adult medical examination without abnormal findings: Secondary | ICD-10-CM | POA: Diagnosis not present

## 2023-01-03 DIAGNOSIS — E559 Vitamin D deficiency, unspecified: Secondary | ICD-10-CM | POA: Diagnosis not present

## 2023-01-03 DIAGNOSIS — Z23 Encounter for immunization: Secondary | ICD-10-CM | POA: Diagnosis not present

## 2023-01-03 DIAGNOSIS — L659 Nonscarring hair loss, unspecified: Secondary | ICD-10-CM | POA: Diagnosis not present

## 2023-01-03 DIAGNOSIS — Z13220 Encounter for screening for lipoid disorders: Secondary | ICD-10-CM | POA: Diagnosis not present

## 2023-01-07 DIAGNOSIS — Z853 Personal history of malignant neoplasm of breast: Secondary | ICD-10-CM | POA: Diagnosis not present

## 2023-01-07 DIAGNOSIS — Z9013 Acquired absence of bilateral breasts and nipples: Secondary | ICD-10-CM | POA: Diagnosis not present

## 2023-01-08 DIAGNOSIS — M9903 Segmental and somatic dysfunction of lumbar region: Secondary | ICD-10-CM | POA: Diagnosis not present

## 2023-01-08 DIAGNOSIS — M9905 Segmental and somatic dysfunction of pelvic region: Secondary | ICD-10-CM | POA: Diagnosis not present

## 2023-01-08 DIAGNOSIS — M9902 Segmental and somatic dysfunction of thoracic region: Secondary | ICD-10-CM | POA: Diagnosis not present

## 2023-01-08 DIAGNOSIS — M9904 Segmental and somatic dysfunction of sacral region: Secondary | ICD-10-CM | POA: Diagnosis not present

## 2023-01-11 DIAGNOSIS — N951 Menopausal and female climacteric states: Secondary | ICD-10-CM | POA: Diagnosis not present

## 2023-01-25 DIAGNOSIS — R6882 Decreased libido: Secondary | ICD-10-CM | POA: Diagnosis not present

## 2023-02-11 DIAGNOSIS — M9905 Segmental and somatic dysfunction of pelvic region: Secondary | ICD-10-CM | POA: Diagnosis not present

## 2023-02-11 DIAGNOSIS — M9904 Segmental and somatic dysfunction of sacral region: Secondary | ICD-10-CM | POA: Diagnosis not present

## 2023-02-11 DIAGNOSIS — M9902 Segmental and somatic dysfunction of thoracic region: Secondary | ICD-10-CM | POA: Diagnosis not present

## 2023-02-11 DIAGNOSIS — M9903 Segmental and somatic dysfunction of lumbar region: Secondary | ICD-10-CM | POA: Diagnosis not present

## 2023-02-25 DIAGNOSIS — R6882 Decreased libido: Secondary | ICD-10-CM | POA: Diagnosis not present

## 2023-03-04 DIAGNOSIS — M9903 Segmental and somatic dysfunction of lumbar region: Secondary | ICD-10-CM | POA: Diagnosis not present

## 2023-03-04 DIAGNOSIS — M9904 Segmental and somatic dysfunction of sacral region: Secondary | ICD-10-CM | POA: Diagnosis not present

## 2023-03-04 DIAGNOSIS — M9905 Segmental and somatic dysfunction of pelvic region: Secondary | ICD-10-CM | POA: Diagnosis not present

## 2023-03-04 DIAGNOSIS — M9902 Segmental and somatic dysfunction of thoracic region: Secondary | ICD-10-CM | POA: Diagnosis not present

## 2023-03-14 DIAGNOSIS — Z23 Encounter for immunization: Secondary | ICD-10-CM | POA: Diagnosis not present

## 2023-03-19 DIAGNOSIS — M9902 Segmental and somatic dysfunction of thoracic region: Secondary | ICD-10-CM | POA: Diagnosis not present

## 2023-03-19 DIAGNOSIS — M9905 Segmental and somatic dysfunction of pelvic region: Secondary | ICD-10-CM | POA: Diagnosis not present

## 2023-03-19 DIAGNOSIS — M9903 Segmental and somatic dysfunction of lumbar region: Secondary | ICD-10-CM | POA: Diagnosis not present

## 2023-03-19 DIAGNOSIS — M9904 Segmental and somatic dysfunction of sacral region: Secondary | ICD-10-CM | POA: Diagnosis not present

## 2023-03-26 DIAGNOSIS — R6882 Decreased libido: Secondary | ICD-10-CM | POA: Diagnosis not present

## 2023-04-11 DIAGNOSIS — D2272 Melanocytic nevi of left lower limb, including hip: Secondary | ICD-10-CM | POA: Diagnosis not present

## 2023-04-11 DIAGNOSIS — L738 Other specified follicular disorders: Secondary | ICD-10-CM | POA: Diagnosis not present

## 2023-04-11 DIAGNOSIS — L814 Other melanin hyperpigmentation: Secondary | ICD-10-CM | POA: Diagnosis not present

## 2023-04-11 DIAGNOSIS — D2261 Melanocytic nevi of right upper limb, including shoulder: Secondary | ICD-10-CM | POA: Diagnosis not present

## 2023-04-11 DIAGNOSIS — L820 Inflamed seborrheic keratosis: Secondary | ICD-10-CM | POA: Diagnosis not present

## 2023-04-16 DIAGNOSIS — M9905 Segmental and somatic dysfunction of pelvic region: Secondary | ICD-10-CM | POA: Diagnosis not present

## 2023-04-16 DIAGNOSIS — M9904 Segmental and somatic dysfunction of sacral region: Secondary | ICD-10-CM | POA: Diagnosis not present

## 2023-04-16 DIAGNOSIS — M9902 Segmental and somatic dysfunction of thoracic region: Secondary | ICD-10-CM | POA: Diagnosis not present

## 2023-04-16 DIAGNOSIS — M9903 Segmental and somatic dysfunction of lumbar region: Secondary | ICD-10-CM | POA: Diagnosis not present

## 2023-04-22 DIAGNOSIS — L659 Nonscarring hair loss, unspecified: Secondary | ICD-10-CM | POA: Diagnosis not present

## 2023-05-10 DIAGNOSIS — Z09 Encounter for follow-up examination after completed treatment for conditions other than malignant neoplasm: Secondary | ICD-10-CM | POA: Diagnosis not present

## 2023-05-10 DIAGNOSIS — Z860101 Personal history of adenomatous and serrated colon polyps: Secondary | ICD-10-CM | POA: Diagnosis not present

## 2023-05-14 DIAGNOSIS — M9902 Segmental and somatic dysfunction of thoracic region: Secondary | ICD-10-CM | POA: Diagnosis not present

## 2023-05-14 DIAGNOSIS — M9903 Segmental and somatic dysfunction of lumbar region: Secondary | ICD-10-CM | POA: Diagnosis not present

## 2023-05-14 DIAGNOSIS — M9904 Segmental and somatic dysfunction of sacral region: Secondary | ICD-10-CM | POA: Diagnosis not present

## 2023-05-14 DIAGNOSIS — M9905 Segmental and somatic dysfunction of pelvic region: Secondary | ICD-10-CM | POA: Diagnosis not present

## 2023-06-04 DIAGNOSIS — M9905 Segmental and somatic dysfunction of pelvic region: Secondary | ICD-10-CM | POA: Diagnosis not present

## 2023-06-04 DIAGNOSIS — M9902 Segmental and somatic dysfunction of thoracic region: Secondary | ICD-10-CM | POA: Diagnosis not present

## 2023-06-04 DIAGNOSIS — M9903 Segmental and somatic dysfunction of lumbar region: Secondary | ICD-10-CM | POA: Diagnosis not present

## 2023-06-04 DIAGNOSIS — M9904 Segmental and somatic dysfunction of sacral region: Secondary | ICD-10-CM | POA: Diagnosis not present

## 2023-06-25 DIAGNOSIS — M9904 Segmental and somatic dysfunction of sacral region: Secondary | ICD-10-CM | POA: Diagnosis not present

## 2023-06-25 DIAGNOSIS — M9903 Segmental and somatic dysfunction of lumbar region: Secondary | ICD-10-CM | POA: Diagnosis not present

## 2023-06-25 DIAGNOSIS — M9905 Segmental and somatic dysfunction of pelvic region: Secondary | ICD-10-CM | POA: Diagnosis not present

## 2023-06-25 DIAGNOSIS — M9902 Segmental and somatic dysfunction of thoracic region: Secondary | ICD-10-CM | POA: Diagnosis not present

## 2023-06-30 DIAGNOSIS — H16002 Unspecified corneal ulcer, left eye: Secondary | ICD-10-CM | POA: Diagnosis not present

## 2023-07-01 DIAGNOSIS — Z5181 Encounter for therapeutic drug level monitoring: Secondary | ICD-10-CM | POA: Diagnosis not present

## 2023-07-02 DIAGNOSIS — S0501XA Injury of conjunctiva and corneal abrasion without foreign body, right eye, initial encounter: Secondary | ICD-10-CM | POA: Diagnosis not present

## 2023-07-02 DIAGNOSIS — S0502XA Injury of conjunctiva and corneal abrasion without foreign body, left eye, initial encounter: Secondary | ICD-10-CM | POA: Diagnosis not present

## 2023-07-02 DIAGNOSIS — H16223 Keratoconjunctivitis sicca, not specified as Sjogren's, bilateral: Secondary | ICD-10-CM | POA: Diagnosis not present

## 2023-07-23 DIAGNOSIS — M9902 Segmental and somatic dysfunction of thoracic region: Secondary | ICD-10-CM | POA: Diagnosis not present

## 2023-07-23 DIAGNOSIS — M9903 Segmental and somatic dysfunction of lumbar region: Secondary | ICD-10-CM | POA: Diagnosis not present

## 2023-07-23 DIAGNOSIS — M9905 Segmental and somatic dysfunction of pelvic region: Secondary | ICD-10-CM | POA: Diagnosis not present

## 2023-07-23 DIAGNOSIS — M9904 Segmental and somatic dysfunction of sacral region: Secondary | ICD-10-CM | POA: Diagnosis not present

## 2023-08-19 DIAGNOSIS — M9905 Segmental and somatic dysfunction of pelvic region: Secondary | ICD-10-CM | POA: Diagnosis not present

## 2023-08-19 DIAGNOSIS — H16223 Keratoconjunctivitis sicca, not specified as Sjogren's, bilateral: Secondary | ICD-10-CM | POA: Diagnosis not present

## 2023-08-19 DIAGNOSIS — M9903 Segmental and somatic dysfunction of lumbar region: Secondary | ICD-10-CM | POA: Diagnosis not present

## 2023-08-19 DIAGNOSIS — M9904 Segmental and somatic dysfunction of sacral region: Secondary | ICD-10-CM | POA: Diagnosis not present

## 2023-08-19 DIAGNOSIS — M9902 Segmental and somatic dysfunction of thoracic region: Secondary | ICD-10-CM | POA: Diagnosis not present

## 2023-09-17 DIAGNOSIS — M9902 Segmental and somatic dysfunction of thoracic region: Secondary | ICD-10-CM | POA: Diagnosis not present

## 2023-09-17 DIAGNOSIS — M9904 Segmental and somatic dysfunction of sacral region: Secondary | ICD-10-CM | POA: Diagnosis not present

## 2023-09-17 DIAGNOSIS — M9903 Segmental and somatic dysfunction of lumbar region: Secondary | ICD-10-CM | POA: Diagnosis not present

## 2023-09-17 DIAGNOSIS — M9905 Segmental and somatic dysfunction of pelvic region: Secondary | ICD-10-CM | POA: Diagnosis not present

## 2023-09-23 DIAGNOSIS — N951 Menopausal and female climacteric states: Secondary | ICD-10-CM | POA: Diagnosis not present

## 2023-10-15 DIAGNOSIS — M9904 Segmental and somatic dysfunction of sacral region: Secondary | ICD-10-CM | POA: Diagnosis not present

## 2023-10-15 DIAGNOSIS — M9903 Segmental and somatic dysfunction of lumbar region: Secondary | ICD-10-CM | POA: Diagnosis not present

## 2023-10-15 DIAGNOSIS — M9902 Segmental and somatic dysfunction of thoracic region: Secondary | ICD-10-CM | POA: Diagnosis not present

## 2023-10-15 DIAGNOSIS — M9905 Segmental and somatic dysfunction of pelvic region: Secondary | ICD-10-CM | POA: Diagnosis not present

## 2023-10-23 DIAGNOSIS — I10 Essential (primary) hypertension: Secondary | ICD-10-CM | POA: Diagnosis not present

## 2023-10-23 DIAGNOSIS — R7989 Other specified abnormal findings of blood chemistry: Secondary | ICD-10-CM | POA: Diagnosis not present

## 2023-10-23 DIAGNOSIS — R946 Abnormal results of thyroid function studies: Secondary | ICD-10-CM | POA: Diagnosis not present

## 2023-10-23 DIAGNOSIS — E559 Vitamin D deficiency, unspecified: Secondary | ICD-10-CM | POA: Diagnosis not present

## 2023-10-25 DIAGNOSIS — Z6827 Body mass index (BMI) 27.0-27.9, adult: Secondary | ICD-10-CM | POA: Diagnosis not present

## 2023-10-25 DIAGNOSIS — Z01419 Encounter for gynecological examination (general) (routine) without abnormal findings: Secondary | ICD-10-CM | POA: Diagnosis not present

## 2023-10-25 DIAGNOSIS — Z124 Encounter for screening for malignant neoplasm of cervix: Secondary | ICD-10-CM | POA: Diagnosis not present

## 2023-10-28 DIAGNOSIS — N951 Menopausal and female climacteric states: Secondary | ICD-10-CM | POA: Diagnosis not present

## 2023-10-28 DIAGNOSIS — R946 Abnormal results of thyroid function studies: Secondary | ICD-10-CM | POA: Diagnosis not present

## 2023-10-28 DIAGNOSIS — E559 Vitamin D deficiency, unspecified: Secondary | ICD-10-CM | POA: Diagnosis not present

## 2023-10-28 DIAGNOSIS — R7989 Other specified abnormal findings of blood chemistry: Secondary | ICD-10-CM | POA: Diagnosis not present

## 2023-10-28 DIAGNOSIS — L680 Hirsutism: Secondary | ICD-10-CM | POA: Diagnosis not present

## 2023-11-12 DIAGNOSIS — M9905 Segmental and somatic dysfunction of pelvic region: Secondary | ICD-10-CM | POA: Diagnosis not present

## 2023-11-12 DIAGNOSIS — M9902 Segmental and somatic dysfunction of thoracic region: Secondary | ICD-10-CM | POA: Diagnosis not present

## 2023-11-12 DIAGNOSIS — M9903 Segmental and somatic dysfunction of lumbar region: Secondary | ICD-10-CM | POA: Diagnosis not present

## 2023-11-12 DIAGNOSIS — M9904 Segmental and somatic dysfunction of sacral region: Secondary | ICD-10-CM | POA: Diagnosis not present

## 2023-12-13 DIAGNOSIS — N951 Menopausal and female climacteric states: Secondary | ICD-10-CM | POA: Diagnosis not present

## 2023-12-13 DIAGNOSIS — Z853 Personal history of malignant neoplasm of breast: Secondary | ICD-10-CM | POA: Diagnosis not present

## 2023-12-17 DIAGNOSIS — M9904 Segmental and somatic dysfunction of sacral region: Secondary | ICD-10-CM | POA: Diagnosis not present

## 2023-12-17 DIAGNOSIS — M9903 Segmental and somatic dysfunction of lumbar region: Secondary | ICD-10-CM | POA: Diagnosis not present

## 2023-12-17 DIAGNOSIS — M9902 Segmental and somatic dysfunction of thoracic region: Secondary | ICD-10-CM | POA: Diagnosis not present

## 2023-12-17 DIAGNOSIS — M9905 Segmental and somatic dysfunction of pelvic region: Secondary | ICD-10-CM | POA: Diagnosis not present

## 2024-01-08 DIAGNOSIS — Z853 Personal history of malignant neoplasm of breast: Secondary | ICD-10-CM | POA: Diagnosis not present

## 2024-01-08 DIAGNOSIS — Z9013 Acquired absence of bilateral breasts and nipples: Secondary | ICD-10-CM | POA: Diagnosis not present

## 2024-01-13 DIAGNOSIS — Z0000 Encounter for general adult medical examination without abnormal findings: Secondary | ICD-10-CM | POA: Diagnosis not present

## 2024-01-15 DIAGNOSIS — M9905 Segmental and somatic dysfunction of pelvic region: Secondary | ICD-10-CM | POA: Diagnosis not present

## 2024-01-15 DIAGNOSIS — M9904 Segmental and somatic dysfunction of sacral region: Secondary | ICD-10-CM | POA: Diagnosis not present

## 2024-01-15 DIAGNOSIS — M9902 Segmental and somatic dysfunction of thoracic region: Secondary | ICD-10-CM | POA: Diagnosis not present

## 2024-01-15 DIAGNOSIS — M9903 Segmental and somatic dysfunction of lumbar region: Secondary | ICD-10-CM | POA: Diagnosis not present

## 2024-02-17 DIAGNOSIS — M9902 Segmental and somatic dysfunction of thoracic region: Secondary | ICD-10-CM | POA: Diagnosis not present

## 2024-02-17 DIAGNOSIS — M9903 Segmental and somatic dysfunction of lumbar region: Secondary | ICD-10-CM | POA: Diagnosis not present

## 2024-02-17 DIAGNOSIS — M9904 Segmental and somatic dysfunction of sacral region: Secondary | ICD-10-CM | POA: Diagnosis not present

## 2024-02-17 DIAGNOSIS — M9905 Segmental and somatic dysfunction of pelvic region: Secondary | ICD-10-CM | POA: Diagnosis not present

## 2024-02-25 DIAGNOSIS — Z23 Encounter for immunization: Secondary | ICD-10-CM | POA: Diagnosis not present

## 2024-03-02 DIAGNOSIS — Z853 Personal history of malignant neoplasm of breast: Secondary | ICD-10-CM | POA: Diagnosis not present

## 2024-04-13 DIAGNOSIS — M9902 Segmental and somatic dysfunction of thoracic region: Secondary | ICD-10-CM | POA: Diagnosis not present

## 2024-04-13 DIAGNOSIS — M9904 Segmental and somatic dysfunction of sacral region: Secondary | ICD-10-CM | POA: Diagnosis not present

## 2024-04-13 DIAGNOSIS — M9905 Segmental and somatic dysfunction of pelvic region: Secondary | ICD-10-CM | POA: Diagnosis not present

## 2024-04-13 DIAGNOSIS — M9903 Segmental and somatic dysfunction of lumbar region: Secondary | ICD-10-CM | POA: Diagnosis not present

## 2024-04-14 DIAGNOSIS — C4441 Basal cell carcinoma of skin of scalp and neck: Secondary | ICD-10-CM | POA: Diagnosis not present

## 2024-04-14 DIAGNOSIS — L814 Other melanin hyperpigmentation: Secondary | ICD-10-CM | POA: Diagnosis not present

## 2024-04-14 DIAGNOSIS — L570 Actinic keratosis: Secondary | ICD-10-CM | POA: Diagnosis not present

## 2024-04-14 DIAGNOSIS — C44519 Basal cell carcinoma of skin of other part of trunk: Secondary | ICD-10-CM | POA: Diagnosis not present

## 2024-04-14 DIAGNOSIS — L821 Other seborrheic keratosis: Secondary | ICD-10-CM | POA: Diagnosis not present

## 2024-04-14 DIAGNOSIS — B078 Other viral warts: Secondary | ICD-10-CM | POA: Diagnosis not present

## 2024-04-14 DIAGNOSIS — L700 Acne vulgaris: Secondary | ICD-10-CM | POA: Diagnosis not present

## 2024-04-14 DIAGNOSIS — C44319 Basal cell carcinoma of skin of other parts of face: Secondary | ICD-10-CM | POA: Diagnosis not present

## 2024-05-06 DIAGNOSIS — R6882 Decreased libido: Secondary | ICD-10-CM | POA: Diagnosis not present

## 2024-05-06 DIAGNOSIS — N951 Menopausal and female climacteric states: Secondary | ICD-10-CM | POA: Diagnosis not present

## 2024-05-12 DIAGNOSIS — M9904 Segmental and somatic dysfunction of sacral region: Secondary | ICD-10-CM | POA: Diagnosis not present

## 2024-05-12 DIAGNOSIS — M9902 Segmental and somatic dysfunction of thoracic region: Secondary | ICD-10-CM | POA: Diagnosis not present

## 2024-05-12 DIAGNOSIS — M9903 Segmental and somatic dysfunction of lumbar region: Secondary | ICD-10-CM | POA: Diagnosis not present

## 2024-05-12 DIAGNOSIS — M9905 Segmental and somatic dysfunction of pelvic region: Secondary | ICD-10-CM | POA: Diagnosis not present

## 2024-05-25 DIAGNOSIS — M9905 Segmental and somatic dysfunction of pelvic region: Secondary | ICD-10-CM | POA: Diagnosis not present

## 2024-05-25 DIAGNOSIS — M9904 Segmental and somatic dysfunction of sacral region: Secondary | ICD-10-CM | POA: Diagnosis not present

## 2024-05-25 DIAGNOSIS — M9903 Segmental and somatic dysfunction of lumbar region: Secondary | ICD-10-CM | POA: Diagnosis not present

## 2024-05-25 DIAGNOSIS — M9902 Segmental and somatic dysfunction of thoracic region: Secondary | ICD-10-CM | POA: Diagnosis not present

## 2024-05-26 DIAGNOSIS — C44319 Basal cell carcinoma of skin of other parts of face: Secondary | ICD-10-CM | POA: Diagnosis not present

## 2024-05-26 DIAGNOSIS — C44519 Basal cell carcinoma of skin of other part of trunk: Secondary | ICD-10-CM | POA: Diagnosis not present
